# Patient Record
Sex: Male | Born: 1940 | Race: White | Hispanic: No | Marital: Married | Smoking: Former smoker
Health system: Southern US, Community
[De-identification: ages and names within clinical notes are randomized; demographics above are authoritative.]

## PROBLEM LIST (undated history)

## (undated) DIAGNOSIS — K219 Gastro-esophageal reflux disease without esophagitis: Secondary | ICD-10-CM

## (undated) DIAGNOSIS — C61 Malignant neoplasm of prostate: Secondary | ICD-10-CM

## (undated) DIAGNOSIS — J189 Pneumonia, unspecified organism: Secondary | ICD-10-CM

## (undated) DIAGNOSIS — D369 Benign neoplasm, unspecified site: Secondary | ICD-10-CM

## (undated) DIAGNOSIS — I2510 Atherosclerotic heart disease of native coronary artery without angina pectoris: Secondary | ICD-10-CM

## (undated) DIAGNOSIS — C4490 Unspecified malignant neoplasm of skin, unspecified: Secondary | ICD-10-CM

## (undated) DIAGNOSIS — Z9889 Other specified postprocedural states: Secondary | ICD-10-CM

## (undated) DIAGNOSIS — I499 Cardiac arrhythmia, unspecified: Secondary | ICD-10-CM

## (undated) DIAGNOSIS — E785 Hyperlipidemia, unspecified: Secondary | ICD-10-CM

## (undated) DIAGNOSIS — H269 Unspecified cataract: Secondary | ICD-10-CM

## (undated) DIAGNOSIS — Z972 Presence of dental prosthetic device (complete) (partial): Secondary | ICD-10-CM

## (undated) DIAGNOSIS — I700 Atherosclerosis of aorta: Secondary | ICD-10-CM

## (undated) DIAGNOSIS — K5790 Diverticulosis of intestine, part unspecified, without perforation or abscess without bleeding: Secondary | ICD-10-CM

## (undated) DIAGNOSIS — N400 Enlarged prostate without lower urinary tract symptoms: Secondary | ICD-10-CM

## (undated) DIAGNOSIS — R0600 Dyspnea, unspecified: Secondary | ICD-10-CM

## (undated) DIAGNOSIS — M109 Gout, unspecified: Secondary | ICD-10-CM

## (undated) DIAGNOSIS — I209 Angina pectoris, unspecified: Secondary | ICD-10-CM

## (undated) DIAGNOSIS — I4891 Unspecified atrial fibrillation: Secondary | ICD-10-CM

## (undated) DIAGNOSIS — N189 Chronic kidney disease, unspecified: Secondary | ICD-10-CM

## (undated) DIAGNOSIS — Z974 Presence of external hearing-aid: Secondary | ICD-10-CM

## (undated) DIAGNOSIS — I219 Acute myocardial infarction, unspecified: Secondary | ICD-10-CM

## (undated) DIAGNOSIS — Z87442 Personal history of urinary calculi: Secondary | ICD-10-CM

## (undated) DIAGNOSIS — M1990 Unspecified osteoarthritis, unspecified site: Secondary | ICD-10-CM

## (undated) DIAGNOSIS — G4733 Obstructive sleep apnea (adult) (pediatric): Secondary | ICD-10-CM

## (undated) DIAGNOSIS — J449 Chronic obstructive pulmonary disease, unspecified: Secondary | ICD-10-CM

## (undated) DIAGNOSIS — M4820 Kissing spine, site unspecified: Secondary | ICD-10-CM

## (undated) DIAGNOSIS — M810 Age-related osteoporosis without current pathological fracture: Secondary | ICD-10-CM

## (undated) DIAGNOSIS — I10 Essential (primary) hypertension: Secondary | ICD-10-CM

## (undated) DIAGNOSIS — G4730 Sleep apnea, unspecified: Secondary | ICD-10-CM

## (undated) DIAGNOSIS — J439 Emphysema, unspecified: Secondary | ICD-10-CM

## (undated) HISTORY — PX: CORONARY ARTERY BYPASS GRAFT: SHX141

## (undated) HISTORY — PX: CHOLECYSTECTOMY: SHX55

## (undated) HISTORY — PX: CATARACT EXTRACTION: SUR2

## (undated) HISTORY — PX: HERNIA REPAIR: SHX51

## (undated) HISTORY — DX: Unspecified malignant neoplasm of skin, unspecified: C44.90

## (undated) HISTORY — PX: OTHER SURGICAL HISTORY: SHX169

## (undated) HISTORY — PX: EYE SURGERY: SHX253

## (undated) HISTORY — PX: RETROPUBIC PROSTATECTOMY: SUR1055

## (undated) HISTORY — PX: BACK SURGERY: SHX140

## (undated) HISTORY — DX: Other specified postprocedural states: Z98.890

## (undated) HISTORY — PX: CORONARY ANGIOPLASTY: SHX604

---

## 1961-09-22 HISTORY — PX: OTHER SURGICAL HISTORY: SHX169

## 1990-09-22 DIAGNOSIS — Z951 Presence of aortocoronary bypass graft: Secondary | ICD-10-CM

## 1990-09-22 HISTORY — DX: Presence of aortocoronary bypass graft: Z95.1

## 1990-09-22 HISTORY — PX: CORONARY ARTERY BYPASS GRAFT: SHX141

## 2005-07-15 ENCOUNTER — Ambulatory Visit: Payer: Self-pay | Admitting: Gastroenterology

## 2006-12-03 ENCOUNTER — Ambulatory Visit: Payer: Self-pay | Admitting: Urology

## 2006-12-30 ENCOUNTER — Inpatient Hospital Stay: Payer: Self-pay | Admitting: Urology

## 2008-07-17 ENCOUNTER — Ambulatory Visit: Payer: Self-pay | Admitting: Gastroenterology

## 2008-08-03 ENCOUNTER — Ambulatory Visit: Payer: Self-pay | Admitting: Internal Medicine

## 2008-08-11 ENCOUNTER — Ambulatory Visit: Payer: Self-pay | Admitting: Unknown Physician Specialty

## 2009-06-11 ENCOUNTER — Ambulatory Visit: Payer: Self-pay | Admitting: Urology

## 2009-12-10 ENCOUNTER — Ambulatory Visit: Payer: Self-pay | Admitting: Urology

## 2010-04-10 ENCOUNTER — Ambulatory Visit: Payer: Self-pay | Admitting: Unknown Physician Specialty

## 2010-04-17 ENCOUNTER — Ambulatory Visit: Payer: Self-pay | Admitting: Unknown Physician Specialty

## 2010-04-19 ENCOUNTER — Ambulatory Visit: Payer: Self-pay | Admitting: Unknown Physician Specialty

## 2010-04-22 LAB — PATHOLOGY REPORT

## 2010-12-09 ENCOUNTER — Ambulatory Visit: Payer: Self-pay | Admitting: Urology

## 2011-01-30 ENCOUNTER — Ambulatory Visit: Payer: Self-pay | Admitting: Gastroenterology

## 2011-02-13 ENCOUNTER — Ambulatory Visit: Payer: Self-pay | Admitting: Gastroenterology

## 2011-02-19 ENCOUNTER — Ambulatory Visit: Payer: Self-pay | Admitting: Gastroenterology

## 2011-06-17 DIAGNOSIS — E785 Hyperlipidemia, unspecified: Secondary | ICD-10-CM | POA: Insufficient documentation

## 2011-06-17 DIAGNOSIS — I2510 Atherosclerotic heart disease of native coronary artery without angina pectoris: Secondary | ICD-10-CM | POA: Insufficient documentation

## 2011-06-17 DIAGNOSIS — E7801 Familial hypercholesterolemia: Secondary | ICD-10-CM | POA: Insufficient documentation

## 2011-06-17 DIAGNOSIS — G4733 Obstructive sleep apnea (adult) (pediatric): Secondary | ICD-10-CM | POA: Insufficient documentation

## 2011-09-07 ENCOUNTER — Emergency Department: Payer: Self-pay | Admitting: Unknown Physician Specialty

## 2011-10-31 ENCOUNTER — Ambulatory Visit: Payer: Self-pay | Admitting: Gastroenterology

## 2011-11-03 LAB — PATHOLOGY REPORT

## 2011-12-10 ENCOUNTER — Ambulatory Visit: Payer: Self-pay | Admitting: Urology

## 2012-12-09 DIAGNOSIS — C61 Malignant neoplasm of prostate: Secondary | ICD-10-CM | POA: Insufficient documentation

## 2012-12-09 DIAGNOSIS — N200 Calculus of kidney: Secondary | ICD-10-CM | POA: Insufficient documentation

## 2012-12-09 DIAGNOSIS — N393 Stress incontinence (female) (male): Secondary | ICD-10-CM | POA: Insufficient documentation

## 2012-12-09 DIAGNOSIS — N3941 Urge incontinence: Secondary | ICD-10-CM | POA: Insufficient documentation

## 2012-12-15 ENCOUNTER — Ambulatory Visit: Payer: Self-pay | Admitting: Urology

## 2012-12-15 DIAGNOSIS — Z8546 Personal history of malignant neoplasm of prostate: Secondary | ICD-10-CM | POA: Insufficient documentation

## 2013-08-25 ENCOUNTER — Encounter: Payer: Self-pay | Admitting: Podiatry

## 2013-08-26 ENCOUNTER — Ambulatory Visit (INDEPENDENT_AMBULATORY_CARE_PROVIDER_SITE_OTHER): Payer: Medicare Other

## 2013-08-26 ENCOUNTER — Ambulatory Visit (INDEPENDENT_AMBULATORY_CARE_PROVIDER_SITE_OTHER): Payer: Medicare Other | Admitting: Podiatry

## 2013-08-26 ENCOUNTER — Encounter: Payer: Self-pay | Admitting: Podiatry

## 2013-08-26 VITALS — BP 104/64 | HR 60 | Resp 20 | Ht 67.0 in | Wt 214.0 lb

## 2013-08-26 DIAGNOSIS — L600 Ingrowing nail: Secondary | ICD-10-CM

## 2013-08-26 DIAGNOSIS — M79609 Pain in unspecified limb: Secondary | ICD-10-CM

## 2013-08-26 DIAGNOSIS — M79672 Pain in left foot: Secondary | ICD-10-CM

## 2013-08-26 DIAGNOSIS — M722 Plantar fascial fibromatosis: Secondary | ICD-10-CM

## 2013-08-26 MED ORDER — TRIAMCINOLONE ACETONIDE 10 MG/ML IJ SUSP
10.0000 mg | Freq: Once | INTRAMUSCULAR | Status: AC
  Administered 2013-08-26: 10 mg

## 2013-08-26 NOTE — Patient Instructions (Signed)

## 2013-08-27 NOTE — Progress Notes (Signed)
Subjective:     Patient ID: Justin [name], male   DOB: 22-Feb-1941, 72 y.o.   MRN: [id number]  HPI patient presents stating my left heel is starting to really hurt me and I am having pain with my toenail I cannot get better myself by trimming or soaking   Review of Systems     Objective:   Physical Exam Neurovascular status intact with no health history changes noted and I noted there to be an incurvated left hallux nail medial border that it's painful and pain to palpation medial and central tubercle at the insertion of the fascia into the calcaneus    Assessment:     Ingrown toenail left hallux and plantar fasciitis left    Plan:     H&P and x-ray reviewed. Today I injected the left plantar fascia 3 mg Kenalog 5 mg Xylocaine Marcaine mixture and recommended ingrown toenail correction. Explained risk which she understands and wants procedure and today I infiltrated 60 mg Xylocaine Marcaine mixture remove the medial corner exposed matrix and applied chemical phenol 3 applications followed by alcohol lavaged and sterile dressing. Gave instructions on soaks and that total recovery. We'll take approximately 4 weeks

## 2013-09-09 ENCOUNTER — Ambulatory Visit (INDEPENDENT_AMBULATORY_CARE_PROVIDER_SITE_OTHER): Payer: Medicare Other | Admitting: Podiatry

## 2013-09-09 ENCOUNTER — Encounter: Payer: Self-pay | Admitting: Podiatry

## 2013-09-09 VITALS — BP 106/62 | HR 61 | Resp 16

## 2013-09-09 DIAGNOSIS — M722 Plantar fascial fibromatosis: Secondary | ICD-10-CM

## 2013-09-09 NOTE — Progress Notes (Signed)
   Subjective:    Patient ID: Justin [name], male    DOB: 29-Dec-1940, 72 y.o.   MRN: [id number]  HPI Comments: Left heel doing great , and the toe is doing well too      Review of Systems     Objective:   Physical Exam        Assessment & Plan:

## 2013-09-09 NOTE — Progress Notes (Signed)
Subjective:     Patient ID: Justin [name], male   DOB: 15-Oct-1940, 72 y.o.   MRN: [id number]  HPI patient states my heel is doing quite a bit better and my ingrown toenail has healed very well  Review of Systems     Objective:   Physical Exam Neurovascular status intact with no health history changes noted and pain to palpation left plantar fascia diminished from previous visit    Assessment:     Plantar fasciitis left improved    Plan:     Advised on physical therapy anti-inflammatories and supportive shoe gear. Reappoint her recheck again in the next one month

## 2013-09-30 DIAGNOSIS — R5383 Other fatigue: Secondary | ICD-10-CM | POA: Insufficient documentation

## 2013-09-30 DIAGNOSIS — R5381 Other malaise: Secondary | ICD-10-CM | POA: Insufficient documentation

## 2013-12-09 ENCOUNTER — Encounter: Payer: Self-pay | Admitting: Podiatry

## 2013-12-09 ENCOUNTER — Ambulatory Visit (INDEPENDENT_AMBULATORY_CARE_PROVIDER_SITE_OTHER): Payer: Medicare Other | Admitting: Podiatry

## 2013-12-09 ENCOUNTER — Ambulatory Visit (INDEPENDENT_AMBULATORY_CARE_PROVIDER_SITE_OTHER): Payer: Medicare Other

## 2013-12-09 DIAGNOSIS — M7750 Other enthesopathy of unspecified foot: Secondary | ICD-10-CM

## 2013-12-09 DIAGNOSIS — M79673 Pain in unspecified foot: Secondary | ICD-10-CM

## 2013-12-09 DIAGNOSIS — M79609 Pain in unspecified limb: Secondary | ICD-10-CM

## 2013-12-09 MED ORDER — TRIAMCINOLONE ACETONIDE 10 MG/ML IJ SUSP
10.0000 mg | Freq: Once | INTRAMUSCULAR | Status: AC
  Administered 2013-12-09: 10 mg

## 2013-12-09 NOTE — Progress Notes (Signed)
Subjective:     Patient ID: Justin [name], male   DOB: 09/30/1940, 73 y.o.   MRN: [id number]  HPI patient presents stating I'm having a lot of pain on top of my left foot making it hard to walk   Review of Systems     Objective:   Physical Exam Neurovascular status intact with inflammation midtarsal joint and spreading to the first metatarsocuneiform joint left foot    Assessment:     Inflammatory tendinitis secondary to bone compression    Plan:     X-ray reviewed and injected the dorsal midtarsal joint and tendon complex 3 mg Kenalog 5 of them slightly Marcaine mixture and advised if symptoms continue to consider orthotics

## 2014-01-21 DIAGNOSIS — K2100 Gastro-esophageal reflux disease with esophagitis, without bleeding: Secondary | ICD-10-CM | POA: Insufficient documentation

## 2014-01-21 DIAGNOSIS — M810 Age-related osteoporosis without current pathological fracture: Secondary | ICD-10-CM | POA: Insufficient documentation

## 2014-02-15 DIAGNOSIS — R339 Retention of urine, unspecified: Secondary | ICD-10-CM | POA: Insufficient documentation

## 2014-02-15 DIAGNOSIS — N139 Obstructive and reflux uropathy, unspecified: Secondary | ICD-10-CM | POA: Insufficient documentation

## 2014-05-12 ENCOUNTER — Encounter: Payer: Self-pay | Admitting: Podiatry

## 2014-05-12 ENCOUNTER — Ambulatory Visit (INDEPENDENT_AMBULATORY_CARE_PROVIDER_SITE_OTHER): Payer: Medicare Other | Admitting: Podiatry

## 2014-05-12 VITALS — BP 101/64 | HR 68 | Resp 16

## 2014-05-12 DIAGNOSIS — M722 Plantar fascial fibromatosis: Secondary | ICD-10-CM

## 2014-05-12 MED ORDER — TRIAMCINOLONE ACETONIDE 10 MG/ML IJ SUSP: 10.0000 mg | Freq: Once | INTRAMUSCULAR | Status: DC

## 2014-05-14 NOTE — Progress Notes (Signed)
Subjective:     Patient ID: Justin [name], male   DOB: [birth year], 73 y.o.   MRN: [id number]  HPI patient presents stating I'm having a lot of pain in my left heel that is worse when I get up in the morning and after periods of sitting   Review of Systems     Objective:   Physical Exam Neurovascular status intact with muscle strength adequate and range of motion subtalar midtarsal joint within normal limits. Patient is found to have exquisite discomfort plantar aspect left heel at the insertion of the tendon into the calcaneus    Assessment:     Plantar fasciitis of the left heel with inflammation and fluid at the insertion of the tendon    Plan:     Reviewed condition and injected the left plantar fashion 3 mg Kenalog 5 mg Xylocaine Marcaine mixture and dispensed night splint with instructions on usage

## 2014-05-19 ENCOUNTER — Ambulatory Visit (INDEPENDENT_AMBULATORY_CARE_PROVIDER_SITE_OTHER): Payer: Medicare Other | Admitting: Podiatry

## 2014-05-19 VITALS — BP 108/58 | HR 56 | Resp 16

## 2014-05-19 DIAGNOSIS — M722 Plantar fascial fibromatosis: Secondary | ICD-10-CM

## 2014-05-19 NOTE — Progress Notes (Signed)
Subjective:     Patient ID: Justin [name], male   DOB: 17-Jan-1941, 73 y.o.   MRN: [id number]  HPI patient presents stating the heel is feeling some better but I'm getting some pain on top of my foot and I think I need orthotics because I don't have the fat pad under my heel   Review of Systems     Objective:   Physical Exam Neurovascular status is intact with a very narrow foot type and significant bone exposure on the plantar aspect of the heel left over right. Patient is found to have minimal discomfort upon palpation but does have some discomfort in the dorsum of the foot    Assessment:     Plantar fasciitis left which is improving with dorsal pain which is still present    Plan:     H&P reviewed and today I scanned for custom orthotics to take stress off the plantar heel. I also went ahead and dispensed a night splint as I do think he is to require stretch given the chronic nature of his plantar fasciitis

## 2014-06-02 ENCOUNTER — Ambulatory Visit (INDEPENDENT_AMBULATORY_CARE_PROVIDER_SITE_OTHER): Payer: Medicare Other | Admitting: *Deleted

## 2014-06-02 VITALS — BP 104/64 | HR 60 | Resp 16

## 2014-06-02 DIAGNOSIS — M722 Plantar fascial fibromatosis: Secondary | ICD-10-CM

## 2014-06-02 NOTE — Patient Instructions (Signed)

## 2014-06-02 NOTE — Progress Notes (Signed)
Pt presents to pick up orthotics. Went over wearing instructions with pt. 

## 2014-08-01 ENCOUNTER — Ambulatory Visit: Payer: Self-pay | Admitting: Internal Medicine

## 2014-10-12 DIAGNOSIS — M5136 Other intervertebral disc degeneration, lumbar region: Secondary | ICD-10-CM | POA: Insufficient documentation

## 2015-11-03 ENCOUNTER — Emergency Department: Payer: Medicare Other

## 2015-11-03 ENCOUNTER — Inpatient Hospital Stay
Admission: EM | Admit: 2015-11-03 | Discharge: 2015-11-08 | DRG: 193 | Disposition: A | Discharge status: Home / Self Care | Payer: Medicare Other | Attending: Internal Medicine | Admitting: Internal Medicine

## 2015-11-03 ENCOUNTER — Encounter: Payer: Self-pay | Admitting: Emergency Medicine

## 2015-11-03 DIAGNOSIS — J9601 Acute respiratory failure with hypoxia: Secondary | ICD-10-CM | POA: Diagnosis present

## 2015-11-03 DIAGNOSIS — J189 Pneumonia, unspecified organism: Secondary | ICD-10-CM | POA: Diagnosis not present

## 2015-11-03 DIAGNOSIS — I214 Non-ST elevation (NSTEMI) myocardial infarction: Secondary | ICD-10-CM | POA: Diagnosis present

## 2015-11-03 DIAGNOSIS — Z951 Presence of aortocoronary bypass graft: Secondary | ICD-10-CM | POA: Diagnosis not present

## 2015-11-03 DIAGNOSIS — R079 Chest pain, unspecified: Secondary | ICD-10-CM | POA: Diagnosis present

## 2015-11-03 DIAGNOSIS — I2510 Atherosclerotic heart disease of native coronary artery without angina pectoris: Secondary | ICD-10-CM | POA: Diagnosis present

## 2015-11-03 DIAGNOSIS — K219 Gastro-esophageal reflux disease without esophagitis: Secondary | ICD-10-CM | POA: Diagnosis present

## 2015-11-03 DIAGNOSIS — G8929 Other chronic pain: Secondary | ICD-10-CM | POA: Diagnosis present

## 2015-11-03 DIAGNOSIS — R0902 Hypoxemia: Secondary | ICD-10-CM | POA: Diagnosis not present

## 2015-11-03 DIAGNOSIS — E785 Hyperlipidemia, unspecified: Secondary | ICD-10-CM | POA: Diagnosis present

## 2015-11-03 DIAGNOSIS — J181 Lobar pneumonia, unspecified organism: Secondary | ICD-10-CM | POA: Diagnosis present

## 2015-11-03 DIAGNOSIS — I219 Acute myocardial infarction, unspecified: Secondary | ICD-10-CM

## 2015-11-03 HISTORY — DX: Atherosclerotic heart disease of native coronary artery without angina pectoris: I25.10

## 2015-11-03 HISTORY — DX: Acute myocardial infarction, unspecified: I21.9

## 2015-11-03 HISTORY — DX: Unspecified osteoarthritis, unspecified site: M19.90

## 2015-11-03 LAB — CBC
HCT: 41.5 % (ref 40.0–52.0)
Hemoglobin: 14.1 g/dL (ref 13.0–18.0)
MCH: 33.3 pg (ref 26.0–34.0)
MCHC: 33.9 g/dL (ref 32.0–36.0)
MCV: 98.3 fL (ref 80.0–100.0)
PLATELETS: 117 10*3/uL — AB (ref 150–440)
RBC: 4.23 MIL/uL — AB (ref 4.40–5.90)
RDW: 13.4 % (ref 11.5–14.5)
WBC: 14.6 10*3/uL — AB (ref 3.8–10.6)

## 2015-11-03 LAB — BASIC METABOLIC PANEL
Anion gap: 8 (ref 5–15)
BUN: 12 mg/dL (ref 6–20)
CALCIUM: 9 mg/dL (ref 8.9–10.3)
CO2: 28 mmol/L (ref 22–32)
CREATININE: 0.99 mg/dL (ref 0.61–1.24)
Chloride: 104 mmol/L (ref 101–111)
Glucose, Bld: 132 mg/dL — ABNORMAL HIGH (ref 65–99)
Potassium: 3.9 mmol/L (ref 3.5–5.1)
SODIUM: 140 mmol/L (ref 135–145)

## 2015-11-03 LAB — TROPONIN I
TROPONIN I: 9.28 ng/mL — AB (ref <0.031)
Troponin I: 3.26 ng/mL — ABNORMAL HIGH (ref <0.031)

## 2015-11-03 LAB — PROTIME-INR
INR: 1.24
PROTHROMBIN TIME: 15.8 s — AB (ref 11.4–15.0)

## 2015-11-03 LAB — APTT: APTT: 122 s — AB (ref 24–36)

## 2015-11-03 LAB — HEPARIN LEVEL (UNFRACTIONATED): Heparin Unfractionated: 0.46 IU/mL (ref 0.30–0.70)

## 2015-11-03 MED ORDER — NITROGLYCERIN 0.4 MG SL SUBL: 0.4000 mg | SUBLINGUAL_TABLET | SUBLINGUAL | Status: DC | PRN

## 2015-11-03 MED ORDER — TRAMADOL HCL 50 MG PO TABS
50.0000 mg | ORAL_TABLET | Freq: Three times a day (TID) | ORAL | Status: DC
  Administered 2015-11-03 – 2015-11-07 (×12): 50 mg via ORAL
  Filled 2015-11-03 (×12): qty 1

## 2015-11-03 MED ORDER — ACETAMINOPHEN 325 MG PO TABS
650.0000 mg | ORAL_TABLET | Freq: Four times a day (QID) | ORAL | Status: DC | PRN
  Administered 2015-11-03 – 2015-11-04 (×3): 650 mg via ORAL
  Filled 2015-11-03 (×3): qty 2

## 2015-11-03 MED ORDER — SODIUM CHLORIDE 0.9 % IV BOLUS (SEPSIS)
1000.0000 mL | Freq: Once | INTRAVENOUS | Status: AC
  Administered 2015-11-03: 1000 mL via INTRAVENOUS

## 2015-11-03 MED ORDER — ATORVASTATIN CALCIUM 20 MG PO TABS
40.0000 mg | ORAL_TABLET | Freq: Every day | ORAL | Status: DC
  Administered 2015-11-03 – 2015-11-07 (×5): 40 mg via ORAL
  Filled 2015-11-03 (×5): qty 2

## 2015-11-03 MED ORDER — FLUTICASONE PROPIONATE 50 MCG/ACT NA SUSP
2.0000 | Freq: Every day | NASAL | Status: DC
  Administered 2015-11-03 – 2015-11-05 (×3): 2 via NASAL
  Filled 2015-11-03: qty 16

## 2015-11-03 MED ORDER — ENOXAPARIN SODIUM 40 MG/0.4ML ~~LOC~~ SOLN: 40.0000 mg | SUBCUTANEOUS | Status: DC

## 2015-11-03 MED ORDER — LEVOFLOXACIN IN D5W 750 MG/150ML IV SOLN
750.0000 mg | Freq: Once | INTRAVENOUS | Status: AC
  Administered 2015-11-03: 750 mg via INTRAVENOUS
  Filled 2015-11-03: qty 150

## 2015-11-03 MED ORDER — IPRATROPIUM-ALBUTEROL 0.5-2.5 (3) MG/3ML IN SOLN: 3.0000 mL | RESPIRATORY_TRACT | Status: DC | PRN

## 2015-11-03 MED ORDER — HEPARIN (PORCINE) IN NACL 100-0.45 UNIT/ML-% IJ SOLN
1050.0000 [IU]/h | INTRAMUSCULAR | Status: DC
  Administered 2015-11-03 – 2015-11-06 (×4): 1050 [IU]/h via INTRAVENOUS
  Filled 2015-11-03 (×8): qty 250

## 2015-11-03 MED ORDER — ONDANSETRON HCL 4 MG/2ML IJ SOLN: 4.0000 mg | Freq: Four times a day (QID) | INTRAMUSCULAR | Status: DC | PRN

## 2015-11-03 MED ORDER — ACETAMINOPHEN 650 MG RE SUPP: 650.0000 mg | Freq: Four times a day (QID) | RECTAL | Status: DC | PRN

## 2015-11-03 MED ORDER — ISOSORBIDE MONONITRATE ER 60 MG PO TB24
60.0000 mg | ORAL_TABLET | Freq: Every day | ORAL | Status: DC
  Administered 2015-11-04 – 2015-11-08 (×5): 60 mg via ORAL
  Filled 2015-11-03 (×5): qty 1

## 2015-11-03 MED ORDER — METOPROLOL TARTRATE 50 MG PO TABS
50.0000 mg | ORAL_TABLET | Freq: Two times a day (BID) | ORAL | Status: DC
  Administered 2015-11-03 – 2015-11-08 (×10): 50 mg via ORAL
  Filled 2015-11-03 (×11): qty 1

## 2015-11-03 MED ORDER — SODIUM CHLORIDE 0.9 % IV SOLN: 250.0000 mL | INTRAVENOUS | Status: DC | PRN

## 2015-11-03 MED ORDER — HEPARIN BOLUS VIA INFUSION
4000.0000 [IU] | Freq: Once | INTRAVENOUS | Status: AC
  Administered 2015-11-03: 4000 [IU] via INTRAVENOUS
  Filled 2015-11-03: qty 4000

## 2015-11-03 MED ORDER — SODIUM CHLORIDE 0.9% FLUSH: 3.0000 mL | INTRAVENOUS | Status: DC | PRN

## 2015-11-03 MED ORDER — ASPIRIN EC 81 MG PO TBEC: 81.0000 mg | DELAYED_RELEASE_TABLET | Freq: Every day | ORAL | Status: DC

## 2015-11-03 MED ORDER — LEVOFLOXACIN IN D5W 750 MG/150ML IV SOLN
750.0000 mg | INTRAVENOUS | Status: DC
  Administered 2015-11-04 – 2015-11-05 (×2): 750 mg via INTRAVENOUS
  Filled 2015-11-03 (×3): qty 150

## 2015-11-03 MED ORDER — LORATADINE 10 MG PO TABS
10.0000 mg | ORAL_TABLET | Freq: Every day | ORAL | Status: DC
  Administered 2015-11-04 – 2015-11-08 (×5): 10 mg via ORAL
  Filled 2015-11-03 (×5): qty 1

## 2015-11-03 MED ORDER — PANTOPRAZOLE SODIUM 40 MG PO TBEC
40.0000 mg | DELAYED_RELEASE_TABLET | Freq: Every day | ORAL | Status: DC
  Administered 2015-11-04 – 2015-11-05 (×2): 40 mg via ORAL
  Filled 2015-11-03 (×2): qty 1

## 2015-11-03 MED ORDER — ASPIRIN EC 81 MG PO TBEC
81.0000 mg | DELAYED_RELEASE_TABLET | Freq: Every day | ORAL | Status: DC
  Administered 2015-11-04 – 2015-11-08 (×5): 81 mg via ORAL
  Filled 2015-11-03 (×5): qty 1

## 2015-11-03 MED ORDER — NIACIN 500 MG PO TABS
500.0000 mg | ORAL_TABLET | Freq: Three times a day (TID) | ORAL | Status: DC
  Administered 2015-11-03 – 2015-11-08 (×13): 500 mg via ORAL
  Filled 2015-11-03 (×16): qty 1

## 2015-11-03 MED ORDER — ASPIRIN 81 MG PO CHEW
324.0000 mg | CHEWABLE_TABLET | Freq: Once | ORAL | Status: AC
  Administered 2015-11-03: 324 mg via ORAL
  Filled 2015-11-03: qty 4

## 2015-11-03 MED ORDER — SODIUM CHLORIDE 0.9% FLUSH
3.0000 mL | Freq: Two times a day (BID) | INTRAVENOUS | Status: DC
  Administered 2015-11-03 – 2015-11-06 (×5): 3 mL via INTRAVENOUS

## 2015-11-03 MED ORDER — MAGNESIUM OXIDE 400 (241.3 MG) MG PO TABS
400.0000 mg | ORAL_TABLET | Freq: Two times a day (BID) | ORAL | Status: DC
  Administered 2015-11-03 – 2015-11-07 (×9): 400 mg via ORAL
  Filled 2015-11-03 (×9): qty 1

## 2015-11-03 MED ORDER — POLYETHYLENE GLYCOL 3350 17 G PO PACK
17.0000 g | PACK | Freq: Every day | ORAL | Status: DC | PRN
  Administered 2015-11-04 – 2015-11-06 (×3): 17 g via ORAL
  Filled 2015-11-03 (×3): qty 1

## 2015-11-03 MED ORDER — SODIUM CHLORIDE 0.9 % IV BOLUS (SEPSIS)
500.0000 mL | Freq: Once | INTRAVENOUS | Status: AC
  Administered 2015-11-03: 500 mL via INTRAVENOUS

## 2015-11-03 MED ORDER — ONDANSETRON HCL 4 MG PO TABS: 4.0000 mg | ORAL_TABLET | Freq: Four times a day (QID) | ORAL | Status: DC | PRN

## 2015-11-03 NOTE — Progress Notes (Signed)
Pharmacy Antibiotic Note  Justin [name] is a 75 y.o. male admitted on 11/03/2015 with pneumonia and ACS.  Pharmacy has been consulted for levofloxacin dosing. Patient received levofloxacin 750mg  X1 in ED at 1400  Plan: Patients CrCl  72 ml/min. Will continue levofloxacin 750mg  IV daily for CAP. Recommended duration of therapy 5-7 days.  Pharmacy will continue to monitor renal function and adjsut dose as needed.   Height: 5\' 8"  (172.7 cm) Weight: 202 lb (91.627 kg) IBW/kg (Calculated) : 68.4  Temp (24hrs), Avg:98.3 F (36.8 C), Min:98.3 F (36.8 C), Max:98.3 F (36.8 C)   Recent Labs Lab 11/03/15 1134  WBC 14.6*  CREATININE 0.99    Estimated Creatinine Clearance: 71.9 mL/min (by C-G formula based on Cr of 0.99).    No Known Allergies  Antimicrobials this admission: 02/11>> levofloxacin   Dose adjustments this admission: N/A  Microbiology results: 02/11 BCx: pending 02/11 Sputum: pending   Thank you for allowing pharmacy to be a part of this patient's care.  Nancy Fetter, PharmD Pharmacy Resident 11/03/2015 2:10 PM

## 2015-11-03 NOTE — H&P (Signed)
Justin [name] at [place] NAME: Justin [name]    MR#:  [id number]  DATE OF BIRTH:  Jan 17, 1941  DATE OF ADMISSION:  11/03/2015  PRIMARY CARE PHYSICIAN: Rusty Aus., MD   REQUESTING/REFERRING PHYSICIAN: Dr. Mariea Clonts  CHIEF COMPLAINT:   Chief Complaint  Patient presents with  . Shortness of Breath    HISTORY OF PRESENT ILLNESS:  Justin [name]  is a 75 y.o. male with a known history of CAD status post CABG presents to the emergency room complaining of acute onset of shortness of breath which woke him up from sleep. Patient normally wears CPAP. He took his CPAP out check his pulse ox and was at 80%. Patient later put his CPAP back and his saturations improved. His shortness of breath slowly improved. But with minimal ambulation patient is desatting into the 70s . His saturations were 79% on ambulation with EMS and he almost passed out. Here his chest x-ray showed right middle and lower lobe pneumonia. White count elevated at 14,000. Afebrile. Patient is not needing oxygen at rest.  Patient did have mild chest heaviness earlier which has resolved. Troponin checked in the emergency room has returned at 9.38. EKG shows nothing acute.  PAST MEDICAL HISTORY:   Past Medical History  Diagnosis Date  . Skin cancer   . History of open heart surgery     PAST SURGICAL HISTORY:   Past Surgical History  Procedure Laterality Date  . Hernia removed    . Open heart surgery    . Skin cancer removed    . Eye surgery      SOCIAL HISTORY:   Social History  Substance Use Topics  . Smoking status: Former Smoker    Quit date: 06/26/1990  . Smokeless tobacco: Never Used  . Alcohol Use: No    FAMILY HISTORY:   Family History  Problem Relation Age of Onset  . Cancer Father     DRUG ALLERGIES:  No Known Allergies  REVIEW OF SYSTEMS:   Review of Systems  Constitutional: Positive for malaise/fatigue. Negative for fever, chills and weight  loss.  HENT: Negative for hearing loss and nosebleeds.   Eyes: Negative for blurred vision, double vision and pain.  Respiratory: Positive for cough. Negative for hemoptysis, sputum production, shortness of breath and wheezing.   Cardiovascular: Positive for chest pain. Negative for palpitations, orthopnea and leg swelling.  Gastrointestinal: Negative for nausea, vomiting, abdominal pain, diarrhea and constipation.  Genitourinary: Negative for dysuria and hematuria.  Musculoskeletal: Positive for back pain. Negative for myalgias and falls.  Skin: Negative for rash.  Neurological: Positive for weakness. Negative for dizziness, tremors, sensory change, speech change, focal weakness, seizures and headaches.  Endo/Heme/Allergies: Does not bruise/bleed easily.  Psychiatric/Behavioral: Negative for depression and memory loss. The patient is not nervous/anxious.     MEDICATIONS AT HOME:   Prior to Admission medications   Medication Sig Start Date End Date Taking? Authorizing Provider  acetaminophen (TYLENOL) 325 MG tablet Take 650 mg by mouth every 6 (six) hours as needed.    Historical Provider, MD  aspirin 81 MG tablet Take 81 mg by mouth daily.    Historical Provider, MD  atorvastatin (LIPITOR) 80 MG tablet Take 80 mg by mouth daily. 1/2 TABLET AT NIGHT    Historical Provider, MD  Calcium Carb-Cholecalciferol (CALCIUM 600 + D PO) Take by mouth. Take two tablets in the morning and take one tablet in the afternoon    Historical Provider, MD  Cholecalciferol (VITAMIN D-3 PO) Take by mouth. Take one tablet in the morning take one tablet in the afternoon and take one tablet at night    Historical Provider, MD  fexofenadine (ALLEGRA) 180 MG tablet Take 180 mg by mouth daily.    Historical Provider, MD  Fish Oil-Cholecalciferol (FISH OIL + D3 PO) Take by mouth.    Historical Provider, MD  fluticasone (VERAMYST) 27.5 MCG/SPRAY nasal spray Place 2 sprays into the nose daily.    Historical Provider, MD   isosorbide mononitrate (IMDUR) 60 MG 24 hr tablet Take 60 mg by mouth daily.    Historical Provider, MD  ketorolac (ACULAR) 0.5 % ophthalmic solution  04/12/14   Historical Provider, MD  LACTOBACILLUS PO Take by mouth 2 (two) times daily.    Historical Provider, MD  Magnesium 400 MG CAPS Take by mouth daily.    Historical Provider, MD  metoprolol (LOPRESSOR) 50 MG tablet Take 50 mg by mouth 2 (two) times daily.    Historical Provider, MD  niacin 500 MG tablet 500 mg. Take one tablet in the morning, take one tablet in the afternoon and take one tablet at night    Historical Provider, MD  nitroGLYCERIN (NITROSTAT) 0.4 MG SL tablet Place 0.4 mg under the tongue as needed for chest pain.    Historical Provider, MD  ofloxacin (OCUFLOX) 0.3 % ophthalmic solution  04/12/14   Historical Provider, MD  omeprazole (PRILOSEC) 20 MG capsule Take 20 mg by mouth 2 (two) times daily before a meal.    Historical Provider, MD  senna-docusate (SENOKOT-S) 8.6-50 MG per tablet Take 1 tablet by mouth. [place] Provider, MD  traMADol (ULTRAM) 50 MG tablet Take 50 mg by mouth every 6 (six) hours as needed.    Historical Provider, MD  vitamin C (ASCORBIC ACID) 500 MG tablet Take 500 mg by mouth daily.    Historical Provider, MD  Zoledronic Acid (RECLAST IV) Inject into the vein. Injection once yearly    Historical Provider, MD      VITAL SIGNS:  Blood pressure 113/65, pulse 76, temperature 98.3 F (36.8 C), temperature source Oral, resp. rate 23, height 5\' 8"  (1.727 m), weight 91.627 kg (202 lb), SpO2 98 %.  PHYSICAL EXAMINATION:  Physical Exam  GENERAL:  75 y.o.-year-old patient lying in the bed with no acute distress.  EYES: Pupils equal, round, reactive to light and accommodation. No scleral icterus. Extraocular muscles intact.  HEENT: Head atraumatic, normocephalic. Oropharynx and nasopharynx clear. No oropharyngeal erythema, moist oral mucosa  NECK:  Supple, no jugular venous distention. No  thyroid enlargement, no tenderness.  LUNGS: Normal breath sounds bilaterally, no wheezing, rales, rhonchi. No use of accessory muscles of respiration. Decreased breath sounds on the right side CARDIOVASCULAR: S1, S2 normal. No murmurs, rubs, or gallops.  ABDOMEN: Soft, nontender, nondistended. Bowel sounds present. No organomegaly or mass.  EXTREMITIES: No pedal edema, cyanosis, or clubbing. + 2 pedal & radial pulses b/l.   NEUROLOGIC: Cranial nerves II through XII are intact. No focal Motor or sensory deficits appreciated b/l PSYCHIATRIC: The patient is alert and oriented x 3. Good affect.  SKIN: No obvious rash, lesion, or ulcer.   LABORATORY PANEL:   CBC  Recent Labs Lab 11/03/15 1134  WBC 14.6*  HGB 14.1  HCT 41.5  PLT 117*   ------------------------------------------------------------------------------------------------------------------  Chemistries   Recent Labs Lab 11/03/15 1134  NA 140  K 3.9  CL 104  CO2 28  GLUCOSE 132*  BUN 12  CREATININE 0.99  CALCIUM 9.0   ------------------------------------------------------------------------------------------------------------------  Cardiac Enzymes  Recent Labs Lab 11/03/15 1134  TROPONINI 9.28*   ------------------------------------------------------------------------------------------------------------------  RADIOLOGY:  Dg Chest 2 View  11/03/2015  CLINICAL DATA:  75 year old male with acute shortness of breath and chills. EXAM: CHEST  2 VIEW COMPARISON:  09/07/2011 and prior chest radiographs FINDINGS: Cardiomegaly and CABG changes noted. Right lower lobe airspace disease is compatible with pneumonia. Possible right middle lobe airspace disease may be present. There is no evidence of pneumothorax or pleural effusion. Thoracic compression fracture and vertebral augmentation changes noted. IMPRESSION: Right lower lobe and possible right middle lobe airspace disease compatible with pneumonia. Radiographic follow-up  to resolution is recommended. Cardiomegaly. Electronically Signed   By: Margarette Canada M.D.   On: 11/03/2015 11:56     IMPRESSION AND PLAN:   * Right middle and lower lobe pneumonia with acute hypoxic respiratory failure We'll start IV Levaquin for community acquired pneumonia. Send for sputum cultures. Blood cultures ordered in the emergency room.  * Elevated troponin with chest heaviness Could be demand ischemia from the pneumonia but patient's vitals are stable. This seems most likely NSTEMI.  Aspirin, statin. We'll start patient on heparin drip to trend troponin levels. Check echocardiogram. Discussed case with Dr. Ubaldo Glassing of cardiology. He will likely need cardiac catheterization.  * Chronic back pain Continue home medications  * DVT prophylaxis Lovenox   All the records are reviewed and case discussed with ED provider. Management plans discussed with the patient, family and they are in agreement.  CODE STATUS: FULL  Patient is critically ill with MI and pneumonia. TOTAL CC TIME TAKING CARE OF THIS PATIENT: 40 minutes.    Hillary Bow R M.D on 11/03/2015 at 1:41 PM  Between 7am to 6pm - Pager - [phone]  After 6pm go to [email or web address] - password [email or web address] [facility]  Office  [phone]  CC: Primary care physician; Rusty Aus., MD  Note: This dictation was prepared with Dragon dictation along with smaller phrase technology. Any transcriptional errors that result from this process are unintentional.

## 2015-11-03 NOTE — Progress Notes (Signed)
Patient admitted to unit. Oriented to room, call bell, and staff. Bed in lowest position. Fall safety plan reviewed. Full assessment to Epic. Skin assessment verified with ---Lurlean Horns RN--. Telemetry box verification with tele clerk and Lurlean Horns RN- Box#: ---[id number]--. Will continue to monitor.

## 2015-11-03 NOTE — ED Notes (Signed)
Phlebotomist called to draw blood cultures. 4 different staff members have attempted blood draw without success.

## 2015-11-03 NOTE — Progress Notes (Signed)
ANTICOAGULATION CONSULT NOTE - Initial Consult  Pharmacy Consult for heparin drip Indication: chest pain/ACS  No Known Allergies  Patient Measurements: Height: 5\' 8"  (172.7 cm) Weight: 202 lb (91.627 kg) IBW/kg (Calculated) : 68.4 Heparin Dosing Weight: 87 kg  Vital Signs: Temp: 98.3 F (36.8 C) (02/11 1122) Temp Source: Oral (02/11 1122) BP: 93/40 mmHg (02/11 1330) Pulse Rate: 65 (02/11 1330)   Recent Labs  11/03/15 1134  HGB 14.1  HCT 41.5  PLT 117*  CREATININE 0.99  TROPONINI 9.28*    Estimated Creatinine Clearance: 71.9 mL/min (by C-G formula based on Cr of 0.99).   Medical History: Past Medical History  Diagnosis Date  . Skin cancer   . History of open heart surgery   . CAD (coronary artery disease)   . Arthritis   . DJD (degenerative joint disease)    Assessment 75 yo male seen in ED with SOB, chills and chest pain.  Patient was diagnosis with PNA and found to have elevated troponin I of 9.28. Pharmacy consulted for dosing and monitoring of heparin for ACS.  Goal of Therapy:  Heparin level 0.3-0.7 units/ml Monitor platelets by anticoagulation protocol: Yes   Plan:  Give 4000 units bolus x 1 Start heparin infusion at 1050 units/hr Check anti-Xa level in 8 hours and daily while on heparin Continue to monitor H&H and platelets  APTT and PT/INR ordered   Nancy Fetter, PharmD Pharmacy Resident  11/03/2015,2:02 PM

## 2015-11-03 NOTE — ED Notes (Signed)
Attending MD notified of BP 89/52. Orders received for x1 bolus 522mL NS.

## 2015-11-03 NOTE — ED Notes (Signed)
Lab notified to send phelbotomist to ED to draw blood cultures. Staff unable to obtain samples.

## 2015-11-03 NOTE — ED Notes (Addendum)
Pt arrived via EMS from home with c/o shortness of breath on exertion and at rest that started around 0400. Pt states he woke up gasping for a breath while he was wearing his CPAP.  Pt also c/o pain between shoulder pains and now lower back pain.  Pt has history of back surgery and bulging disc.  Pt also reports headache that started last night pain is in the temporal area and radiates to the neck.  Pt wears CPAP at night. Pt states he has been having chills as well/

## 2015-11-03 NOTE — ED Provider Notes (Signed)
[facility] Emergency Department Provider Note  ____________________________________________  Time seen: Approximately 12:37 PM  I have reviewed the triage vital signs and the nursing notes.   HISTORY  Chief Complaint Shortness of Breath    HPI Justin [name] is a 75 y.o. male presenting with shortness of breath and chills. Patient reports that he uses CPAP at night, but despite using it overnight he developed acute "gasping for air." This was associated with chills. He also had worsening shortness of breath with even minimal exertion such as sitting upwards. His O2 monitor at home stated that his oxygen level was 79%. No cough, rhinorrhea, sore throat or ear pain. Positive congestion. The patient's brother is currently admitted for pneumonia and the patient did spend 8 hours in the waiting room and part of the brothers hospitalization in contact with him. No known fever. Patient did have a brief episode of chest "pressure" centrally without radiation on arrival here but is currently chest pain-free.   Past Medical History  Diagnosis Date  . Skin cancer   . History of open heart surgery     Patient Active Problem List   Diagnosis Date Noted  . Incomplete bladder emptying 02/15/2014  . Obstruction of urinary tract 02/15/2014  . OP (osteoporosis) 01/21/2014  . Esophagitis, reflux 01/21/2014  . Fatigue 09/30/2013  . Malaise and fatigue 09/30/2013  . H/O malignant neoplasm of prostate 12/15/2012  . Calculus of kidney 12/09/2012  . ED (erectile dysfunction) of organic origin 12/09/2012  . CA of prostate ([place]) 12/09/2012  . Genuine stress incontinence, male 12/09/2012  . Urge incontinence 12/09/2012  . Arteriosclerosis of coronary artery 06/17/2011  . Atherosclerosis of coronary artery 06/17/2011  . HLD (hyperlipidemia) 06/17/2011  . Obstructive apnea 06/17/2011    Past Surgical History  Procedure Laterality Date  . Hernia removed    . Open heart  surgery    . Skin cancer removed    . Eye surgery      Current Outpatient Rx  Name  Route  Sig  Dispense  Refill  . acetaminophen (TYLENOL) 325 MG tablet   Oral   Take 650 mg by mouth every 6 (six) hours as needed.         Marland Kitchen aspirin 81 MG tablet   Oral   Take 81 mg by mouth daily.         Marland Kitchen atorvastatin (LIPITOR) 80 MG tablet   Oral   Take 80 mg by mouth daily. 1/2 TABLET AT NIGHT         . Calcium Carb-Cholecalciferol (CALCIUM 600 + D PO)   Oral   Take by mouth. Take two tablets in the morning and take one tablet in the afternoon         . Cholecalciferol (VITAMIN D-3 PO)   Oral   Take by mouth. Take one tablet in the morning take one tablet in the afternoon and take one tablet at night         . fexofenadine (ALLEGRA) 180 MG tablet   Oral   Take 180 mg by mouth daily.         . Fish Oil-Cholecalciferol (FISH OIL + D3 PO)   Oral   Take by mouth.         . fluticasone (VERAMYST) 27.5 MCG/SPRAY nasal spray   Nasal   Place 2 sprays into the nose daily.         . isosorbide mononitrate (IMDUR) 60 MG 24 hr tablet  Oral   Take 60 mg by mouth daily.         Marland Kitchen ketorolac (ACULAR) 0.5 % ophthalmic solution               . LACTOBACILLUS PO   Oral   Take by mouth 2 (two) times daily.         . Magnesium 400 MG CAPS   Oral   Take by mouth daily.         . metoprolol (LOPRESSOR) 50 MG tablet   Oral   Take 50 mg by mouth 2 (two) times daily.         . niacin 500 MG tablet      500 mg. Take one tablet in the morning, take one tablet in the afternoon and take one tablet at night         . nitroGLYCERIN (NITROSTAT) 0.4 MG SL tablet   Sublingual   Place 0.4 mg under the tongue as needed for chest pain.         Marland Kitchen ofloxacin (OCUFLOX) 0.3 % ophthalmic solution               . omeprazole (PRILOSEC) 20 MG capsule   Oral   Take 20 mg by mouth 2 (two) times daily before a meal.         . senna-docusate (SENOKOT-S) 8.6-50 MG per  tablet   Oral   Take 1 tablet by mouth. 4 TIMES DAILY         . traMADol (ULTRAM) 50 MG tablet   Oral   Take 50 mg by mouth every 6 (six) hours as needed.         . vitamin C (ASCORBIC ACID) 500 MG tablet   Oral   Take 500 mg by mouth daily.         . Zoledronic Acid (RECLAST IV)   Intravenous   Inject into the vein. Injection once yearly           Allergies Review of patient's allergies indicates no known allergies.  Family History  Problem Relation Age of Onset  . Cancer Father     Social History Social History  Substance Use Topics  . Smoking status: Former Smoker    Quit date: 06/26/1990  . Smokeless tobacco: Never Used  . Alcohol Use: No    Review of Systems Constitutional: No fever. Positive shaking chills. Positive lightheadedness. Negative syncope. Eyes: No visual changes. ENT: No sore throat. Positive congestion without rhinorrhea. Cardiovascular: Positive central chest pain, without palpitations. Respiratory: Positive exertional shortness of breath.  No cough. Gastrointestinal: No abdominal pain.  No nausea, no vomiting.  No diarrhea.  No constipation. Genitourinary: Negative for dysuria. Musculoskeletal: Negative for back pain. Skin: Negative for rash. Neurological: Negative for headaches, focal weakness or numbness.  10-point ROS otherwise negative.  ____________________________________________   PHYSICAL EXAM:  VITAL SIGNS: ED Triage Vitals  Enc Vitals Group     BP 11/03/15 1122 113/65 mmHg     Pulse Rate 11/03/15 1122 76     Resp 11/03/15 1122 23     Temp 11/03/15 1122 98.3 F (36.8 C)     Temp Source 11/03/15 1122 Oral     SpO2 11/03/15 1122 98 %     Weight 11/03/15 1122 202 lb (91.627 kg)     Height 11/03/15 1122 5\' 8"  (1.727 m)     Head Cir --      Peak Flow --  Pain Score 11/03/15 1123 8     Pain Loc --      Pain Edu? --      Excl. in [place]? --     Constitutional: Alert and oriented. Well appearing and in no acute  distress. Answer question appropriately. Eyes: Conjunctivae are normal.  EOMI. no scleral icterus. Head: Atraumatic. Nose: Positive congestion without rhinorrhea. Mouth/Throat: Mucous membranes are moist.  Neck: No stridor.  Supple.  No JVD. Cardiovascular: Normal rate, regular rhythm. No murmurs, rubs or gallops.  Respiratory: Normal respiratory effort.  No retractions. Rales in the right lower lung base. Good air exchange. Able to speak in full sentences. Gastrointestinal: Soft and nontender. No distention. No peritoneal signs. Musculoskeletal: No LE edema. No palpable cords or tenderness to palpation in the calves. Neurologic:  Normal speech and language. No gross focal neurologic deficits are appreciated.  Skin:  Skin is warm, dry and intact. No rash noted. Psychiatric: Mood and affect are normal. Speech and behavior are normal.  Normal judgement.  ____________________________________________   LABS (all labs ordered are listed, but only abnormal results are displayed)  Labs Reviewed  CBC - Abnormal; Notable for the following:    WBC 14.6 (*)    RBC 4.23 (*)    Platelets 117 (*)    All other components within normal limits  CULTURE, BLOOD (ROUTINE X 2)  CULTURE, BLOOD (ROUTINE X 2)  BASIC METABOLIC PANEL  TROPONIN I   ____________________________________________  EKG  ED ECG REPORT I, Eula Listen, the attending physician, personally viewed and interpreted this ECG.   Date: 11/03/2015  EKG Time: 1135  Rate: 79  Rhythm: normal sinus rhythm  Axis: Normal  Intervals:first-degree A-V block   ST&T Change: No ST elevation. No ischemic changes.  ____________________________________________  RADIOLOGY  Dg Chest 2 View  11/03/2015  CLINICAL DATA:  75 year old male with acute shortness of breath and chills. EXAM: CHEST  2 VIEW COMPARISON:  09/07/2011 and prior chest radiographs FINDINGS: Cardiomegaly and CABG changes noted. Right lower lobe airspace disease is  compatible with pneumonia. Possible right middle lobe airspace disease may be present. There is no evidence of pneumothorax or pleural effusion. Thoracic compression fracture and vertebral augmentation changes noted. IMPRESSION: Right lower lobe and possible right middle lobe airspace disease compatible with pneumonia. Radiographic follow-up to resolution is recommended. Cardiomegaly. Electronically Signed   By: Margarette Canada M.D.   On: 11/03/2015 11:56    ____________________________________________   PROCEDURES  Procedure(s) performed: None  Critical Care performed: No ____________________________________________   INITIAL IMPRESSION / ASSESSMENT AND PLAN / ED COURSE  Pertinent labs & imaging results that were available during my care of the patient were reviewed by me and considered in my medical decision making (see chart for details).  75 y.o. male presenting with shaking chills, shortness of breath, and infiltrate on his chest x-ray which is consistent with a right lower lobe pneumonia. The patient did have hypoxia home and therefore warrants admission for further treatment and evaluation.  ____________________________________________  FINAL CLINICAL IMPRESSION(S) / ED DIAGNOSES  Final diagnoses:  CAP (community acquired pneumonia)  Hypoxia  Chest pain, unspecified chest pain type      NEW MEDICATIONS STARTED DURING THIS VISIT:  New Prescriptions   No medications on file     Eula Listen, MD 11/03/15 1241

## 2015-11-04 LAB — TROPONIN I
TROPONIN I: 9.33 ng/mL — AB (ref <0.031)
Troponin I: 8.89 ng/mL — ABNORMAL HIGH (ref <0.031)
Troponin I: 9.75 ng/mL — ABNORMAL HIGH (ref <0.031)
Troponin I: 10.27 ng/mL — ABNORMAL HIGH (ref <0.031)

## 2015-11-04 LAB — CBC
HEMATOCRIT: 36.5 % — AB (ref 40.0–52.0)
Hemoglobin: 12.4 g/dL — ABNORMAL LOW (ref 13.0–18.0)
MCH: 33.4 pg (ref 26.0–34.0)
MCHC: 34 g/dL (ref 32.0–36.0)
MCV: 98.2 fL (ref 80.0–100.0)
Platelets: 95 10*3/uL — ABNORMAL LOW (ref 150–440)
RBC: 3.72 MIL/uL — ABNORMAL LOW (ref 4.40–5.90)
RDW: 13.2 % (ref 11.5–14.5)
WBC: 14.3 10*3/uL — ABNORMAL HIGH (ref 3.8–10.6)

## 2015-11-04 LAB — LIPID PANEL
CHOL/HDL RATIO: 1.8 ratio
Cholesterol: 98 mg/dL (ref 0–200)
HDL: 56 mg/dL (ref >40)
LDL CALC: 29 mg/dL (ref 0–99)
TRIGLYCERIDES: 64 mg/dL (ref <150)
VLDL: 13 mg/dL (ref 0–40)

## 2015-11-04 LAB — BASIC METABOLIC PANEL
Anion gap: 4 — ABNORMAL LOW (ref 5–15)
BUN: 12 mg/dL (ref 6–20)
CHLORIDE: 110 mmol/L (ref 101–111)
CO2: 27 mmol/L (ref 22–32)
Calcium: 8.4 mg/dL — ABNORMAL LOW (ref 8.9–10.3)
Creatinine, Ser: 0.8 mg/dL (ref 0.61–1.24)
GFR calc Af Amer: >60 mL/min (ref >60)
GFR calc non Af Amer: >60 mL/min (ref >60)
GLUCOSE: 102 mg/dL — AB (ref 65–99)
POTASSIUM: 3.4 mmol/L — AB (ref 3.5–5.1)
Sodium: 141 mmol/L (ref 135–145)

## 2015-11-04 LAB — CKMB (ARMC ONLY): CK, MB: 2.5 ng/mL (ref 0.5–5.0)

## 2015-11-04 LAB — STREP PNEUMONIAE URINARY ANTIGEN: STREP PNEUMO URINARY ANTIGEN: NEGATIVE

## 2015-11-04 LAB — HEPARIN LEVEL (UNFRACTIONATED): HEPARIN UNFRACTIONATED: 0.55 [IU]/mL (ref 0.30–0.70)

## 2015-11-04 LAB — CK: CK TOTAL: 382 U/L (ref 49–397)

## 2015-11-04 MED ORDER — HYDROCODONE-ACETAMINOPHEN 5-325 MG PO TABS
1.0000 | ORAL_TABLET | ORAL | Status: DC | PRN
  Administered 2015-11-04 – 2015-11-05 (×4): 1 via ORAL
  Administered 2015-11-06: 2 via ORAL
  Administered 2015-11-06 – 2015-11-07 (×2): 1 via ORAL
  Administered 2015-11-07: 2 via ORAL
  Filled 2015-11-04: qty 2
  Filled 2015-11-04 (×2): qty 1
  Filled 2015-11-04: qty 2
  Filled 2015-11-04 (×4): qty 1

## 2015-11-04 MED ORDER — TRAMADOL HCL 50 MG PO TABS
50.0000 mg | ORAL_TABLET | Freq: Once | ORAL | Status: AC
  Administered 2015-11-04: 50 mg via ORAL
  Filled 2015-11-04: qty 1

## 2015-11-04 NOTE — Progress Notes (Signed)
Patient is complaining of back pain unrelieved with Tylenol 650 mg PRN. Dr. Marcille Blanco notified with a new order for Tramadol 50 mg oral x 1 dose. Will continue to monitor.

## 2015-11-04 NOTE — Progress Notes (Signed)
ANTICOAGULATION CONSULT NOTE - Initial Consult  Pharmacy Consult for heparin drip Indication: chest pain/ACS  No Known Allergies  Patient Measurements: Height: 5\' 8"  (172.7 cm) Weight: 202 lb (91.627 kg) IBW/kg (Calculated) : 68.4 Heparin Dosing Weight: 87 kg  Vital Signs: Temp: 98.3 F (36.8 C) (02/11 1955) Temp Source: Oral (02/11 1955) BP: 111/54 mmHg (02/11 1955) Pulse Rate: 72 (02/11 1955)   Recent Labs  11/03/15 1134 11/03/15 1647 11/03/15 1816 11/03/15 2319  HGB 14.1  --   --   --   HCT 41.5  --   --   --   PLT 117*  --   --   --   APTT  --   --  122*  --   LABPROT  --   --  15.8*  --   INR  --   --  1.24  --   HEPARINUNFRC  --   --   --  0.46  CREATININE 0.99  --   --   --   TROPONINI 9.28* 3.26*  --  8.89*    Estimated Creatinine Clearance: 71.9 mL/min (by C-G formula based on Cr of 0.99).   Medical History: Past Medical History  Diagnosis Date  . Skin cancer   . History of open heart surgery   . CAD (coronary artery disease)   . Arthritis   . DJD (degenerative joint disease)    Assessment 75 yo male seen in ED with SOB, chills and chest pain.  Patient was diagnosis with PNA and found to have elevated troponin I of 9.28. Pharmacy consulted for dosing and monitoring of heparin for ACS.  Goal of Therapy:  Heparin level 0.3-0.7 units/ml Monitor platelets by anticoagulation protocol: Yes   Plan:  Give 4000 units bolus x 1 Start heparin infusion at 1050 units/hr Check anti-Xa level in 8 hours and daily while on heparin Continue to monitor H&H and platelets  APTT and PT/INR ordered   2/11 23:30 heparin level 0.46. Recheck in 8 hours to confirm.  Sim Boast, PharmD, BCPS  11/04/2015

## 2015-11-04 NOTE — Consult Note (Signed)
[place]  CARDIOLOGY CONSULT NOTE  Patient ID: Justin [name] MRN: [id number] DOB/AGE: 04-28-1941 75 y.o.  Admit date: 11/03/2015 Referring Physician Dr. Darvin Neighbours Primary Physician MFM Primary Cardiologist Dr. Otto Herb Reason for Consultation nstemi  HPI: 75 yo male with hisstory of cad s/p cabg at [facility] in 1992 with lima to lad, svg to lpl and svg to om. Cath in 2010 revealed 100% lad,lcx and rca with patent lima to lad, patent svg to lpl and occluded svg to om. Treated medically. Has history of hyperlipidemia, htn, sleep apnea and pvcs. Last seen at [facility] 10/30/15 and was doing well .  CXR revealed no pulmonary edema,. Echo done then revealed ef 55% with la of 4.9 cm, trivial tr, with trivial mr and no ai or as. Has had several days of worsening sob and presented to the er with complaints of sob and chest tightness seveal days earlier. CXR revealed  Roll and possible rml pna. EKG was unremarkable. Troponin was drawn per er protocol which revealed level to be 9.28 with subsequent values of 3.26 and 8.89. Hemodynamically stable. Less sob and no chest pain other than discomfort over lima take down site that is chronic.  Review of Systems  Constitutional: Positive for chills.  Eyes: Negative.   Respiratory: Positive for cough and shortness of breath.   Cardiovascular: Negative.   Gastrointestinal: Positive for nausea and vomiting.  Genitourinary: Negative.   Musculoskeletal: Negative.   Neurological: Positive for headaches.  Endo/Heme/Allergies: Negative.   Psychiatric/Behavioral: Negative.     Past Medical History  Diagnosis Date  . Skin cancer   . History of open heart surgery   . CAD (coronary artery disease)   . Arthritis   . DJD (degenerative joint disease)     Family History  Problem Relation Age of Onset  . Cancer Father     Social History   Social History  . Marital Status: Married    Spouse Name: N/A  . Number of Children: N/A   . Years of Education: N/A   Occupational History  . Not on file.   Social History Main Topics  . Smoking status: Former Smoker    Quit date: 06/26/1990  . Smokeless tobacco: Never Used  . Alcohol Use: No  . Drug Use: No  . Sexual Activity: Not on file   Other Topics Concern  . Not on file   Social History Narrative    Past Surgical History  Procedure Laterality Date  . Hernia removed    . Open heart surgery    . Skin cancer removed    . Eye surgery       Facility-administered medications prior to admission  Medication Dose Route Frequency Provider Last Rate Last Dose  . triamcinolone acetonide (KENALOG) 10 MG/ML injection 10 mg  10 mg Other Once Wallene Huh, DPM       Prescriptions prior to admission  Medication Sig Dispense Refill Last Dose  . acetaminophen (TYLENOL) 325 MG tablet Take 650 mg by mouth 3 (three) times daily.    11/02/2015 at Unknown time  . aspirin 81 MG tablet Take 81 mg by mouth daily.   11/02/2015 at Unknown time  . atorvastatin (LIPITOR) 80 MG tablet Take 40 mg by mouth at bedtime.    11/02/2015 at Unknown time  . Calcium Carb-Cholecalciferol (CALCIUM 600 + D PO) Take 1 tablet by mouth 2 (two) times daily. Take morning and at noon.   11/02/2015 at Unknown  time  . cholecalciferol (VITAMIN D) 1000 units tablet Take 1,000 Units by mouth 3 (three) times daily.   11/02/2015 at Unknown time  . fexofenadine (ALLEGRA) 180 MG tablet Take 180 mg by mouth daily before breakfast.    11/03/2015 at Unknown time  . fluticasone (FLONASE) 50 MCG/ACT nasal spray Place 2 sprays into both nostrils at bedtime.   11/02/2015 at Unknown time  . isosorbide mononitrate (IMDUR) 60 MG 24 hr tablet Take 60 mg by mouth daily.   11/03/2015 at Unknown time  . LACTOBACILLUS PO Take 2 tablets by mouth every morning.    11/02/2015 at Unknown time  . Magnesium 400 MG CAPS Take 400 mg by mouth 2 (two) times daily.    11/02/2015 at Unknown time  . metoprolol (LOPRESSOR) 50 MG tablet Take 50 mg by  mouth 2 (two) times daily.   11/02/2015 at 0730  . niacin 500 MG tablet Take 500 mg by mouth 3 (three) times daily. Take one tablet in the morning, take one tablet in the afternoon and take one tablet at night   11/02/2015 at Unknown time  . nitroGLYCERIN (NITROSTAT) 0.4 MG SL tablet Place 0.4 mg under the tongue every 5 (five) minutes x 3 doses as needed for chest pain. If no relief call MD or go to emergency room.   unknown  . Omega-3 Fatty Acids (FISH OIL) 1000 MG CAPS Take 1,000 mg by mouth 2 (two) times daily.   11/03/2015 at Unknown time  . omeprazole (PRILOSEC) 20 MG capsule Take 20 mg by mouth 2 (two) times daily before a meal.   11/03/2015 at Unknown time  . senna-docusate (SENOKOT-S) 8.6-50 MG per tablet Take 2 tablets by mouth 2 (two) times daily. 4 TIMES DAILY   11/02/2015 at Unknown time  . traMADol (ULTRAM) 50 MG tablet Take 50 mg by mouth 3 (three) times daily.    11/03/2015 at Unknown time  . vitamin C (ASCORBIC ACID) 500 MG tablet Take 500 mg by mouth daily.   11/02/2015 at Unknown time  . Zoledronic Acid (RECLAST IV) Inject into the vein. Injection once yearly   07/24/2015    Physical Exam: Blood pressure 124/57, pulse 60, temperature 98 F (36.7 C), temperature source Oral, resp. rate 18, height 5\' 8"  (1.727 m), weight 91.717 kg (202 lb 3.2 oz), SpO2 93 %.    General appearance: alert and cooperative Head: Normocephalic, without obvious abnormality, atraumatic Resp: diminished breath sounds RLL and RML Chest wall: left sided chest wall tenderness Cardio: regular rate and rhythm GI: soft, non-tender; bowel sounds normal; no masses,  no organomegaly Extremities: extremities normal, atraumatic, no cyanosis or edema Pulses: 2+ and symmetric Neurologic: Grossly normal Labs:   Lab Results  Component Value Date   WBC 14.3* 11/04/2015   HGB 12.4* 11/04/2015   HCT 36.5* 11/04/2015   MCV 98.2 11/04/2015   PLT 95* 11/04/2015    Recent Labs Lab 11/04/15 0710  NA 141  K 3.4*  CL  110  CO2 27  BUN 12  CREATININE 0.80  CALCIUM 8.4*  GLUCOSE 102*   Lab Results  Component Value Date   TROPONINI 8.89* 11/03/2015      Radiology: RLL and RML pneumonia EKG: NSR with no ischemia or significant change from baseline  ASSESSMENT AND PLAN:  75 yo male with history of cad s/p cabg in 1992 with cardaic cath in 2010 showing 100% native vessels with patent lima to lad, patent svg to lpl and occluded svg to om  treated medically. He has been doing fairly well and was seen 5 days ago in cards clinic [facility] with echo showing normal ef with no significant valvular disease and cxr that was unremarkable. He is treated with beta blockers, long acting nitrates, asa, and high intensity statin. WIll repeat echo today to compare with one done 5 days ago to look for wall motion abnormality. Will repeat troponin to follow trend. WIll check ck/mb. Will discuss with her primary cardiologist at [facility]. Will likely need cardiac cath when improved from pneumonia. Continue with iv heparin.  Signed: Teodoro Spray MD, [facility] 11/04/2015, 8:58 AM

## 2015-11-04 NOTE — Progress Notes (Signed)
[place] [facility] [place] NAME: Justin [name]    MR#:  [id number]  DATE OF BIRTH:  1941/08/29  SUBJECTIVE:  CHIEF COMPLAINT:   Chief Complaint  Patient presents with  . Shortness of Breath   - Patient admitted with dyspnea, fevers and chills and also chest pain. -Chest x-ray with right-sided pneumonia, also elevated troponin. -Breathing is improved chest pain is much better now. Remains on heparin drip and also IV antibiotics. -Appreciate cardiology input  REVIEW OF SYSTEMS:  Review of Systems  Constitutional: Negative for fever and chills.  HENT: Negative for ear discharge, ear pain and nosebleeds.   Respiratory: Positive for shortness of breath. Negative for cough and wheezing.   Cardiovascular: Positive for chest pain. Negative for palpitations.  Gastrointestinal: Negative for nausea, vomiting, abdominal pain, diarrhea and constipation.  Genitourinary: Negative for dysuria.  Musculoskeletal: Positive for back pain.  Neurological: Negative for dizziness, sensory change, speech change, focal weakness, seizures, weakness and headaches.  Psychiatric/Behavioral: Negative for depression.    DRUG ALLERGIES:  No Known Allergies  VITALS:  Blood pressure 126/56, pulse 68, temperature 97.9 F (36.6 C), temperature source Oral, resp. rate 20, height 5\' 8"  (1.727 m), weight 91.717 kg (202 lb 3.2 oz), SpO2 94 %.  PHYSICAL EXAMINATION:  Physical Exam  GENERAL:  75 y.o.-year-old patient lying in the bed with no acute distress.  EYES: Pupils equal, round, reactive to light and accommodation. No scleral icterus. Extraocular muscles intact.  HEENT: Head atraumatic, normocephalic. Oropharynx and nasopharynx clear.  NECK:  Supple, no jugular venous distention. No thyroid enlargement, no tenderness.  LUNGS: Normal breath sounds bilaterally, no wheezing, rales,rhonchi or crepitation. No use of accessory muscles of respiration. Decreased bibasilar  breath sounds CARDIOVASCULAR: S1, S2 normal. No rubs, or gallops. 3/6 systolic murmur present ABDOMEN: Soft, nontender, nondistended. Bowel sounds present. No organomegaly or mass.  EXTREMITIES: No pedal edema, cyanosis, or clubbing.  NEUROLOGIC: Cranial nerves II through XII are intact. Muscle strength 5/5 in all extremities. Sensation intact. Gait not checked.  PSYCHIATRIC: The patient is alert and oriented x 3.  SKIN: No obvious rash, lesion, or ulcer.    LABORATORY PANEL:   CBC  Recent Labs Lab 11/04/15 0710  WBC 14.3*  HGB 12.4*  HCT 36.5*  PLT 95*   ------------------------------------------------------------------------------------------------------------------  Chemistries   Recent Labs Lab 11/04/15 0710  NA 141  K 3.4*  CL 110  CO2 27  GLUCOSE 102*  BUN 12  CREATININE 0.80  CALCIUM 8.4*   ------------------------------------------------------------------------------------------------------------------  Cardiac Enzymes  Recent Labs Lab 11/04/15 0949  TROPONINI 9.75*   ------------------------------------------------------------------------------------------------------------------  RADIOLOGY:  Dg Chest 2 View  11/03/2015  CLINICAL DATA:  75 year old male with acute shortness of breath and chills. EXAM: CHEST  2 VIEW COMPARISON:  09/07/2011 and prior chest radiographs FINDINGS: Cardiomegaly and CABG changes noted. Right lower lobe airspace disease is compatible with pneumonia. Possible right middle lobe airspace disease may be present. There is no evidence of pneumothorax or pleural effusion. Thoracic compression fracture and vertebral augmentation changes noted. IMPRESSION: Right lower lobe and possible right middle lobe airspace disease compatible with pneumonia. Radiographic follow-up to resolution is recommended. Cardiomegaly. Electronically Signed   By: Margarette Canada M.D.   On: 11/03/2015 11:56    EKG:   Orders placed or performed during the hospital  encounter of 11/03/15  . ED EKG within 10 minutes  . ED EKG within 10 minutes  . EKG 12-Lead  . EKG 12-Lead  .  ED EKG  . ED EKG    ASSESSMENT AND PLAN:   75 year old male with past medical history significant for coronary reactive disease status post CABG, degenerative arthritis, hyperlipidemia and GERD presents to the hospital secondary to worsening dyspnea and also chest pain.  #1 community-acquired pneumonia-right middle lobe and also lower lobe pneumonia noted on the chest x-ray associated with hypoxic respiratory failure on presentation. -Hypoxia has resolved. Currently remains on room air. -Blood cultures are pending. Started on Levaquin for pneumonia. -Breathing is much improved.  #2 NSTEMI-recent echocardiogram 1 week ago was normal as outpatient. Troponins are significantly elevated and also associated with chest heaviness. -No EKG changes. Appreciate cardiology consult. -Due to inconsistent elevation of troponins, repeat troponins are ordered. Follow-up echo to compare it with last week's echocardiogram. -Continue IV heparin drip for now. Continue all the cardiac medications. -Patient might likely need to cardiac catheterization this admission.  #3 coronary artery disease-status post CABG. Currently on heparin drip and management as mentioned above. -Continue all cardiac medications. On aspirin, metoprolol, statin, Imdur.  #4 chronic low back pain-adjust pain medications.  #5 GERD-on Protonix  #6 DVT prophylaxis-on IV heparin     All the records are reviewed and case discussed with Care Management/Social Workerr. Management plans discussed with the patient, family and they are in agreement.  CODE STATUS: Full Code  TOTAL TIME TAKING CARE OF THIS PATIENT: 37 minutes.   POSSIBLE D/C IN 2-3 DAYS, DEPENDING ON CLINICAL CONDITION.   Gladstone Lighter M.D on 11/04/2015 at 12:38 PM  Between 7am to 6pm - Pager - [phone]  After 6pm go to [email or web address] - password  [facility]  [facility]  Office  [phone]  CC: Primary care physician; Rusty Aus., MD

## 2015-11-04 NOTE — Care Management (Signed)
Presents from home with shortness of breath.  Patient has home cpap.  When woke up short of breath his room air sat at rest was 80% then 70% with exertion. CXR shows pneumonia.  He has history previous CABG and now with nstemi.  He is followed at [facility] for cardiac issues.  His current 02 requirements are acute.  At present, do not see documentation of any chronic diagnosis that would meet criteria for chronic oxygen.

## 2015-11-04 NOTE — Progress Notes (Signed)
ANTICOAGULATION CONSULT NOTE - follow up [place] for heparin drip Indication: chest pain/ACS  No Known Allergies  Patient Measurements: Height: 5\' 8"  (172.7 cm) Weight: 202 lb 3.2 oz (91.717 kg) IBW/kg (Calculated) : 68.4 Heparin Dosing Weight: 87 kg  Vital Signs: Temp: 98 F (36.7 C) (02/12 0520) Temp Source: Oral (02/12 0520) BP: 124/57 mmHg (02/12 0746) Pulse Rate: 60 (02/12 0520)   Recent Labs  11/03/15 1134 11/03/15 1647 11/03/15 1816 11/03/15 2319 11/04/15 0710  HGB 14.1  --   --   --  12.4*  HCT 41.5  --   --   --  36.5*  PLT 117*  --   --   --  95*  APTT  --   --  122*  --   --   LABPROT  --   --  15.8*  --   --   INR  --   --  1.24  --   --   HEPARINUNFRC  --   --   --  0.46 0.55  CREATININE 0.99  --   --   --  0.80  TROPONINI 9.28* 3.26*  --  8.89*  --     Estimated Creatinine Clearance: 89 mL/min (by C-G formula based on Cr of 0.8).   Medical History: Past Medical History  Diagnosis Date  . Skin cancer   . History of open heart surgery   . CAD (coronary artery disease)   . Arthritis   . DJD (degenerative joint disease)    Assessment 75 yo male seen in ED with SOB, chills and chest pain.  Patient was diagnosis with PNA and found to have elevated troponin I of 9.28. Pharmacy consulted for dosing and monitoring of heparin for ACS.  Goal of Therapy:  Heparin level 0.3-0.7 units/ml Monitor platelets by anticoagulation protocol: Yes   Plan:  Give 4000 units bolus x 1 Start heparin infusion at 1050 units/hr Check anti-Xa level in 8 hours and daily while on heparin Continue to monitor H&H and platelets  APTT and PT/INR ordered   2/11 23:30 heparin level 0.46. Recheck in 8 hours to confirm.  2/12 Heparin level at 0710= 0.55. Continue current rate of 1050 units/hr. F/u Heparin level with am labs.  Chinita Greenland PharmD Clinical Pharmacist 11/04/2015 8:39 AM

## 2015-11-05 ENCOUNTER — Inpatient Hospital Stay
Admit: 2015-11-05 | Discharge: 2015-11-05 | Disposition: A | Discharge status: Home / Self Care | Payer: Medicare Other | Attending: Internal Medicine | Admitting: Internal Medicine

## 2015-11-05 LAB — BASIC METABOLIC PANEL
Anion gap: 5 (ref 5–15)
BUN: 10 mg/dL (ref 6–20)
CALCIUM: 8.5 mg/dL — AB (ref 8.9–10.3)
CO2: 25 mmol/L (ref 22–32)
CREATININE: 0.91 mg/dL (ref 0.61–1.24)
Chloride: 109 mmol/L (ref 101–111)
GFR calc Af Amer: >60 mL/min (ref >60)
GFR calc non Af Amer: >60 mL/min (ref >60)
GLUCOSE: 112 mg/dL — AB (ref 65–99)
POTASSIUM: 3.6 mmol/L (ref 3.5–5.1)
Sodium: 139 mmol/L (ref 135–145)

## 2015-11-05 LAB — TROPONIN I
Troponin I: 9.04 ng/mL — ABNORMAL HIGH (ref <0.031)
Troponin I: 9.23 ng/mL — ABNORMAL HIGH (ref <0.031)

## 2015-11-05 LAB — CBC
HCT: 36.9 % — ABNORMAL LOW (ref 40.0–52.0)
HEMOGLOBIN: 12.5 g/dL — AB (ref 13.0–18.0)
MCH: 33.8 pg (ref 26.0–34.0)
MCHC: 33.8 g/dL (ref 32.0–36.0)
MCV: 99.9 fL (ref 80.0–100.0)
PLATELETS: 92 10*3/uL — AB (ref 150–440)
RBC: 3.7 MIL/uL — ABNORMAL LOW (ref 4.40–5.90)
RDW: 13.3 % (ref 11.5–14.5)
WBC: 9.8 10*3/uL (ref 3.8–10.6)

## 2015-11-05 LAB — HEPARIN LEVEL (UNFRACTIONATED): HEPARIN UNFRACTIONATED: 0.41 [IU]/mL (ref 0.30–0.70)

## 2015-11-05 MED ORDER — SENNOSIDES-DOCUSATE SODIUM 8.6-50 MG PO TABS
2.0000 | ORAL_TABLET | Freq: Two times a day (BID) | ORAL | Status: DC
  Administered 2015-11-05 – 2015-11-07 (×5): 2 via ORAL
  Filled 2015-11-05 (×5): qty 2

## 2015-11-05 MED ORDER — LEVOFLOXACIN 750 MG PO TABS
750.0000 mg | ORAL_TABLET | Freq: Every day | ORAL | Status: DC
  Administered 2015-11-06 – 2015-11-08 (×3): 750 mg via ORAL
  Filled 2015-11-05 (×3): qty 1

## 2015-11-05 MED ORDER — PANTOPRAZOLE SODIUM 40 MG PO TBEC
40.0000 mg | DELAYED_RELEASE_TABLET | Freq: Two times a day (BID) | ORAL | Status: DC
  Administered 2015-11-05 – 2015-11-08 (×6): 40 mg via ORAL
  Filled 2015-11-05 (×6): qty 1

## 2015-11-05 NOTE — Progress Notes (Signed)
ANTICOAGULATION CONSULT NOTE - follow up [place] for heparin drip Indication: chest pain/ACS  No Known Allergies  Patient Measurements: Height: 5\' 8"  (172.7 cm) Weight: 202 lb 3.2 oz (91.717 kg) IBW/kg (Calculated) : 68.4 Heparin Dosing Weight: 87 kg  Vital Signs: Temp: 97.8 F (36.6 C) (02/13 0543) Temp Source: Oral (02/13 0543) BP: 121/69 mmHg (02/13 0543) Pulse Rate: 58 (02/13 0543)   Recent Labs  11/03/15 1134  11/03/15 1816 11/03/15 2319 11/04/15 0710 11/04/15 0949 11/04/15 1537 11/04/15 2145 11/05/15 0419  HGB 14.1  --   --   --  12.4*  --   --   --  12.5*  HCT 41.5  --   --   --  36.5*  --   --   --  36.9*  PLT 117*  --   --   --  95*  --   --   --  92*  APTT  --   --  122*  --   --   --   --   --   --   LABPROT  --   --  15.8*  --   --   --   --   --   --   INR  --   --  1.24  --   --   --   --   --   --   HEPARINUNFRC  --   --   --  0.46 0.55  --   --   --  0.41  CREATININE 0.99  --   --   --  0.80  --   --   --  0.91  CKTOTAL  --   --   --   --   --  382  --   --   --   CKMB  --   --   --   --   --  2.5  --   --   --   TROPONINI 9.28*  < >  --  8.89*  --  9.75* 9.33* 10.27* 9.04*  < > = values in this interval not displayed.  Estimated Creatinine Clearance: 78.3 mL/min (by C-G formula based on Cr of 0.91).   Medical History: Past Medical History  Diagnosis Date  . Skin cancer   . History of open heart surgery   . CAD (coronary artery disease)   . Arthritis   . DJD (degenerative joint disease)    Assessment 75 yo male seen in ED with SOB, chills and chest pain.  Patient was diagnosis with PNA and found to have elevated troponin I of 9.28. Pharmacy consulted for dosing and monitoring of heparin for ACS.  Goal of Therapy:  Heparin level 0.3-0.7 units/ml Monitor platelets by anticoagulation protocol: Yes   Plan:  Give 4000 units bolus x 1 Start heparin infusion at 1050 units/hr Check anti-Xa level in 8 hours and daily while on  heparin Continue to monitor H&H and platelets  APTT and PT/INR ordered   2/11 23:30 heparin level 0.46. Recheck in 8 hours to confirm.  2/12 Heparin level at 0710= 0.55. Continue current rate of 1050 units/hr. F/u Heparin level with am labs.  2/13 AM heparin level 0.41. Continue current regimen. Recheck heparin level and CBC in AM.  Chinita Greenland PharmD Clinical Pharmacist 11/05/2015 6:22 AM

## 2015-11-05 NOTE — Care Management Important Message (Signed)
Important Message  Patient Details  Name: Justin [name] MRN: [id number] Date of Birth: 1941-07-17   Medicare Important Message Given:  Yes    [name] 11/05/2015, 2:55 PM

## 2015-11-05 NOTE — Progress Notes (Signed)
Pharmacy Antibiotic Note- Day 3  Justin [name] is a 75 y.o. male admitted on 11/03/2015 with pneumonia and ACS.  Pharmacy has been consulted for levofloxacin dosing. Patient received levofloxacin 750mg  X1 in ED at 1400  Plan: Will continue levofloxacin 750mg  PO daily for CAP. Recommended duration of therapy 5-7 days.    Height: 5\' 8"  (172.7 cm) Weight: 201 lb 6.4 oz (91.354 kg) IBW/kg (Calculated) : 68.4  Temp (24hrs), Avg:98.1 F (36.7 C), Min:97.8 F (36.6 C), Max:98.5 F (36.9 C)   Recent Labs Lab 11/03/15 1134 11/04/15 0710 11/05/15 0419  WBC 14.6* 14.3* 9.8  CREATININE 0.99 0.80 0.91    Estimated Creatinine Clearance: 78.2 mL/min (by C-G formula based on Cr of 0.91).    No Known Allergies  Antimicrobials this admission: 02/11>> levofloxacin   Dose adjustments this admission: N/A  Microbiology results: 02/11 BCx: pending 02/11 Sputum: pending   Thank you for allowing pharmacy to be a part of this patient's care.  Nancy Fetter, PharmD Pharmacy Resident 11/05/2015 9:51 PM

## 2015-11-05 NOTE — Progress Notes (Signed)
[place]  SUBJECTIVE: still a little sob. No chest pain   Filed Vitals:   11/04/15 1122 11/04/15 2120 11/05/15 0543 11/05/15 1219  BP: 126/56 131/75 121/69 119/65  Pulse: 68 71 58 59  Temp: 97.9 F (36.6 C) 98.5 F (36.9 C) 97.8 F (36.6 C) 98.1 F (36.7 C)  TempSrc: Oral Oral Oral Oral  Resp: 20  18 18   Height:      Weight:   91.354 kg (201 lb 6.4 oz)   SpO2: 94% 95% 94% 94%    Intake/Output Summary (Last 24 hours) at 11/05/15 1353 Last data filed at 11/05/15 1225  Gross per 24 hour  Intake  709.9 ml  Output   2825 ml  Net -2115.1 ml    LABS: Basic Metabolic Panel:  Recent Labs  11/04/15 0710 11/05/15 0419  NA 141 139  K 3.4* 3.6  CL 110 109  CO2 27 25  GLUCOSE 102* 112*  BUN 12 10  CREATININE 0.80 0.91  CALCIUM 8.4* 8.5*   Liver Function Tests: No results for input(s): AST, ALT, ALKPHOS, BILITOT, PROT, ALBUMIN in the last 72 hours. No results for input(s): LIPASE, AMYLASE in the last 72 hours. CBC:  Recent Labs  11/04/15 0710 11/05/15 0419  WBC 14.3* 9.8  HGB 12.4* 12.5*  HCT 36.5* 36.9*  MCV 98.2 99.9  PLT 95* 92*   Cardiac Enzymes:  Recent Labs  11/04/15 0949  11/04/15 2145 11/05/15 0419 11/05/15 1024  CKTOTAL 382  --   --   --   --   CKMB 2.5  --   --   --   --   TROPONINI 9.75*  < > 10.27* 9.04* 9.23*  < > = values in this interval not displayed. BNP: Invalid input(s): POCBNP D-Dimer: No results for input(s): DDIMER in the last 72 hours. Hemoglobin A1C: No results for input(s): HGBA1C in the last 72 hours. Fasting Lipid Panel:  Recent Labs  11/04/15 1537  CHOL 98  HDL 56  LDLCALC 29  TRIG 64  CHOLHDL 1.8   Thyroid Function Tests: No results for input(s): TSH, T4TOTAL, T3FREE, THYROIDAB in the last 72 hours.  Invalid input(s): FREET3 Anemia Panel: No results for input(s): VITAMINB12, FOLATE, FERRITIN, TIBC, IRON, RETICCTPCT in the last 72 hours.   Physical Exam: Blood  pressure 119/65, pulse 59, temperature 98.1 F (36.7 C), temperature source Oral, resp. rate 18, height 5\' 8"  (1.727 m), weight 91.354 kg (201 lb 6.4 oz), SpO2 94 %.    General appearance: alert and cooperative Resp: clear to auscultation bilaterally Cardio: regular rate and rhythm GI: soft, non-tender; bowel sounds normal; no masses,  no organomegaly  TELEMETRY: Reviewed telemetry pt in nsr:  ASSESSMENT AND PLAN:  Active Problems:   Pneumonia-on iv abx. Will continue   NSTEMI (non-ST elevated myocardial infarction) (HCC)-troponin remains elevated at 9-10 range. Somewhat atypical for acs but given anatomy, acs needs considerred. Peri/myocarditis may also be playing a role. EF appears unchanged from echo reported less than 1 week ago. WIll continue treating pna and consider cath prior to discharge, likely in 48 hours. Will discuss with his primary cardiologist at [place]., MD, [facility] 11/05/2015 1:53 PM

## 2015-11-05 NOTE — Progress Notes (Signed)
*  PRELIMINARY RESULTS* Echocardiogram 2D Echocardiogram has been performed.  Justin [name] 11/05/2015, 11:13 AM

## 2015-11-05 NOTE — Care Management (Signed)
Patient has a "Trilogy" at home- not a cpap.  Says he uses it for obstructive sleep apnea and "my brain does not always tell my body to breathe."  Prior to this admission, patient independent in his adls and able to drive.  Denies issues accessing medical care, obtaining medications, transportation.  He has not been out of bed since admission due to need for further cardiac assessment for positive troponins.  Cardiac cath continues to be considered.

## 2015-11-06 ENCOUNTER — Inpatient Hospital Stay: Admit: 2015-11-06 | Payer: Medicare Other

## 2015-11-06 LAB — CBC
HEMATOCRIT: 37.9 % — AB (ref 40.0–52.0)
Hemoglobin: 12.9 g/dL — ABNORMAL LOW (ref 13.0–18.0)
MCH: 33.8 pg (ref 26.0–34.0)
MCHC: 34 g/dL (ref 32.0–36.0)
MCV: 99.2 fL (ref 80.0–100.0)
Platelets: 119 10*3/uL — ABNORMAL LOW (ref 150–440)
RBC: 3.82 MIL/uL — ABNORMAL LOW (ref 4.40–5.90)
RDW: 13.4 % (ref 11.5–14.5)
WBC: 8.2 10*3/uL (ref 3.8–10.6)

## 2015-11-06 LAB — HEPARIN LEVEL (UNFRACTIONATED): Heparin Unfractionated: 0.34 IU/mL (ref 0.30–0.70)

## 2015-11-06 MED ORDER — SODIUM CHLORIDE 0.9 % IV SOLN: 250.0000 mL | INTRAVENOUS | Status: DC | PRN

## 2015-11-06 MED ORDER — SODIUM CHLORIDE 0.9 % WEIGHT BASED INFUSION
1.0000 mL/kg/h | INTRAVENOUS | Status: DC
  Administered 2015-11-07: 1 mL/kg/h via INTRAVENOUS

## 2015-11-06 MED ORDER — SODIUM CHLORIDE 0.9% FLUSH: 3.0000 mL | Freq: Two times a day (BID) | INTRAVENOUS | Status: DC

## 2015-11-06 MED ORDER — SODIUM CHLORIDE 0.9% FLUSH: 3.0000 mL | INTRAVENOUS | Status: DC | PRN

## 2015-11-06 MED ORDER — SODIUM CHLORIDE 0.9 % WEIGHT BASED INFUSION
3.0000 mL/kg/h | INTRAVENOUS | Status: DC
  Administered 2015-11-07: 3 mL/kg/h via INTRAVENOUS

## 2015-11-06 NOTE — Progress Notes (Signed)
[place]  SUBJECTIVE: constipation   Filed Vitals:   11/05/15 1219 11/05/15 2057 11/06/15 0556 11/06/15 0826  BP: 119/65 147/70 142/77 132/81  Pulse: 59 73 67 62  Temp: 98.1 F (36.7 C) 98.5 F (36.9 C) 97.8 F (36.6 C) 98.2 F (36.8 C)  TempSrc: Oral Oral Oral Oral  Resp: 18 16 16 18   Height:      Weight:   89.449 kg (197 lb 3.2 oz)   SpO2: 94% 94% 94% 94%    Intake/Output Summary (Last 24 hours) at 11/06/15 0846 Last data filed at 11/06/15 0558  Gross per 24 hour  Intake    840 ml  Output   3250 ml  Net  -2410 ml    LABS: Basic Metabolic Panel:  Recent Labs  11/04/15 0710 11/05/15 0419  NA 141 139  K 3.4* 3.6  CL 110 109  CO2 27 25  GLUCOSE 102* 112*  BUN 12 10  CREATININE 0.80 0.91  CALCIUM 8.4* 8.5*   Liver Function Tests: No results for input(s): AST, ALT, ALKPHOS, BILITOT, PROT, ALBUMIN in the last 72 hours. No results for input(s): LIPASE, AMYLASE in the last 72 hours. CBC:  Recent Labs  11/05/15 0419 11/06/15 0427  WBC 9.8 8.2  HGB 12.5* 12.9*  HCT 36.9* 37.9*  MCV 99.9 99.2  PLT 92* 119*   Cardiac Enzymes:  Recent Labs  11/04/15 0949  11/04/15 2145 11/05/15 0419 11/05/15 1024  CKTOTAL 382  --   --   --   --   CKMB 2.5  --   --   --   --   TROPONINI 9.75*  < > 10.27* 9.04* 9.23*  < > = values in this interval not displayed. BNP: Invalid input(s): POCBNP D-Dimer: No results for input(s): DDIMER in the last 72 hours. Hemoglobin A1C: No results for input(s): HGBA1C in the last 72 hours. Fasting Lipid Panel:  Recent Labs  11/04/15 1537  CHOL 98  HDL 56  LDLCALC 29  TRIG 64  CHOLHDL 1.8   Thyroid Function Tests: No results for input(s): TSH, T4TOTAL, T3FREE, THYROIDAB in the last 72 hours.  Invalid input(s): FREET3 Anemia Panel: No results for input(s): VITAMINB12, FOLATE, FERRITIN, TIBC, IRON, RETICCTPCT in the last 72 hours.   Physical Exam: Blood pressure 132/81, pulse 62,  temperature 98.2 F (36.8 C), temperature source Oral, resp. rate 18, height 5\' 8"  (1.727 m), weight 89.449 kg (197 lb 3.2 oz), SpO2 94 %.    General appearance: alert and cooperative Resp: clear to auscultation bilaterally Cardio: regular rate and rhythm and regularly irregular rhythm GI: abnormal findings:  constipated  TELEMETRY: Reviewed telemetry pt in nsr:  ASSESSMENT AND PLAN:  Active Problems:   Pneumonia-on abx   NSTEMI (non-ST elevated myocardial infarction) (HCC)-will plan cardiac cath tomorrow.     Teodoro Spray., MD, [facility] 11/06/2015 8:46 AM

## 2015-11-06 NOTE — Progress Notes (Signed)
[place] at [place] NAME: Geoge Double    MR#:  [id number]  DATE OF BIRTH:  [birth year]  SUBJECTIVE:  CHIEF COMPLAINT:   Chief Complaint  Patient presents with  . Shortness of Breath   - Patient admitted with dyspnea, fevers and chills and also chest pain. -Chest x-ray with right-sided pneumonia, also elevated troponin. -Breathing is improved chest pain is much better now. Remains on heparin drip and also IV antibiotics. -Appreciate cardiology input, suggest to wait until pneumonia resolves. - now planned for cardiac cath tomorrow.  REVIEW OF SYSTEMS:  Review of Systems  Constitutional: Negative for fever and chills.  HENT: Negative for ear discharge, ear pain and nosebleeds.   Respiratory: Negative for cough, shortness of breath and wheezing.   Cardiovascular: Negative for chest pain and palpitations.  Gastrointestinal: Negative for nausea, vomiting, abdominal pain, diarrhea and constipation.  Genitourinary: Negative for dysuria.  Musculoskeletal: Positive for back pain.  Neurological: Negative for dizziness, sensory change, speech change, focal weakness, seizures, weakness and headaches.  Psychiatric/Behavioral: Negative for depression.    DRUG ALLERGIES:  No Known Allergies  VITALS:  Blood pressure 149/78, pulse 77, temperature 98.1 F (36.7 C), temperature source Oral, resp. rate 20, height 5\' 8"  (1.727 m), weight 89.449 kg (197 lb 3.2 oz), SpO2 96 %.  PHYSICAL EXAMINATION:  Physical Exam  GENERAL:  75 y.o.-year-old patient lying in the bed with no acute distress.  EYES: Pupils equal, round, reactive to light and accommodation. No scleral icterus. Extraocular muscles intact.  HEENT: Head atraumatic, normocephalic. Oropharynx and nasopharynx clear.  NECK:  Supple, no jugular venous distention. No thyroid enlargement, no tenderness.  LUNGS: Normal breath sounds bilaterally, no wheezing, rales,rhonchi or crepitation. No  use of accessory muscles of respiration. Decreased bibasilar breath sounds CARDIOVASCULAR: S1, S2 normal. No rubs, or gallops. 3/6 systolic murmur present ABDOMEN: Soft, nontender, nondistended. Bowel sounds present. No organomegaly or mass.  EXTREMITIES: No pedal edema, cyanosis, or clubbing.  NEUROLOGIC: Cranial nerves II through XII are intact. Muscle strength 5/5 in all extremities. Sensation intact. Gait not checked.  PSYCHIATRIC: The patient is alert and oriented x 3.  SKIN: No obvious rash, lesion, or ulcer.    LABORATORY PANEL:   CBC  Recent Labs Lab 11/06/15 0427  WBC 8.2  HGB 12.9*  HCT 37.9*  PLT 119*   ------------------------------------------------------------------------------------------------------------------  Chemistries   Recent Labs Lab 11/05/15 0419  NA 139  K 3.6  CL 109  CO2 25  GLUCOSE 112*  BUN 10  CREATININE 0.91  CALCIUM 8.5*   ------------------------------------------------------------------------------------------------------------------  Cardiac Enzymes  Recent Labs Lab 11/05/15 1024  TROPONINI 9.23*   ------------------------------------------------------------------------------------------------------------------  RADIOLOGY:  No results found.  EKG:   Orders placed or performed during the hospital encounter of 11/03/15  . ED EKG within 10 minutes  . ED EKG within 10 minutes  . EKG 12-Lead  . EKG 12-Lead  . ED EKG  . ED EKG    ASSESSMENT AND PLAN:   75 year old male with past medical history significant for coronary reactive disease status post CABG, degenerative arthritis, hyperlipidemia and GERD presents to the hospital secondary to worsening dyspnea and also chest pain.  #1 community-acquired pneumonia-right middle lobe and also lower lobe pneumonia  associated  with hypoxic respiratory failure on presentation. -Hypoxia has resolved. Currently remains on room air. -Blood cultures are negative.   on Levaquin for  pneumonia. -Breathing is much improved.  #2 NSTEMI-recent echocardiogram 1 week ago was  normal as outpatient. Troponins are significantly elevated and also associated with chest heaviness. -No EKG changes. Appreciate cardiology consult. - troponin stayed significantly high on follow ups. Echo showed no significant change compared to last week. -Continue IV heparin drip for now. Continue all the cardiac medications. - Cardiac cath planned on Wednesday. - If no stents, then may d/c the same day.  #3 coronary artery disease-status post CABG. Currently on heparin drip and management as mentioned above. -Continue all cardiac medications. On aspirin, metoprolol, statin, Imdur.  #4 chronic low back pain-adjust pain medications.  #5 GERD-on Protonix  #6 DVT prophylaxis-on IV heparin    All the records are reviewed and case discussed with Care Management/Social Workerr. Management plans discussed with the patient, family and they are in agreement.  CODE STATUS: Full Code  TOTAL TIME TAKING CARE OF THIS PATIENT: 30 minutes.   POSSIBLE D/C IN 2-3 DAYS, DEPENDING ON CLINICAL CONDITION.   Vaughan Basta M.D on 11/06/2015 at 9:30 PM  Between 7am to 6pm - Pager - [phone]  After 6pm go to [email or web address] - password [email or web address] [facility]  [place] [facility]  Office  [phone]  CC: Primary care physician; Rusty Aus., MD

## 2015-11-06 NOTE — Progress Notes (Signed)
Pharmacy Antibiotic Note- Day 4  Justin [name] is a 75 y.o. male admitted on 11/03/2015 with pneumonia and ACS.  Pharmacy has been consulted for levofloxacin dosing. Patient received levofloxacin 750mg  X1 in ED at 1400  Plan: Will continue levofloxacin 750mg  PO daily for CAP. Recommended duration of therapy 5-7 days.    Height: 5\' 8"  (172.7 cm) Weight: 197 lb 3.2 oz (89.449 kg) IBW/kg (Calculated) : 68.4  Temp (24hrs), Avg:98 F (36.7 C), Min:97.6 F (36.4 C), Max:98.5 F (36.9 C)   Recent Labs Lab 11/03/15 1134 11/04/15 0710 11/05/15 0419 11/06/15 0427  WBC 14.6* 14.3* 9.8 8.2  CREATININE 0.99 0.80 0.91  --     Estimated Creatinine Clearance: 77.4 mL/min (by C-G formula based on Cr of 0.91).    No Known Allergies  Antimicrobials this admission: 02/11>> levofloxacin   Dose adjustments this admission: N/A  Microbiology results: 02/11 BCx x 2: no growth x 3 days 02/11 Sputum: pending   Pharmacy will continue to monitor and adjust per consult.     Currie Paris, PharmD 11/06/2015 4:33 PM

## 2015-11-06 NOTE — Progress Notes (Signed)
Justin [name] at [place] NAME: Justin [name]    MR#:  [id number]  DATE OF BIRTH:  05-03-1941  SUBJECTIVE:  CHIEF COMPLAINT:   Chief Complaint  Patient presents with  . Shortness of Breath   - Patient admitted with dyspnea, fevers and chills and also chest pain. -Chest x-ray with right-sided pneumonia, also elevated troponin. -Breathing is improved chest pain is much better now. Remains on heparin drip and also IV antibiotics. -Appreciate cardiology input, suggest to wait until pneumonia resolves.  REVIEW OF SYSTEMS:  Review of Systems  Constitutional: Negative for fever and chills.  HENT: Negative for ear discharge, ear pain and nosebleeds.   Respiratory: Positive for shortness of breath. Negative for cough and wheezing.   Cardiovascular: Positive for chest pain. Negative for palpitations.  Gastrointestinal: Negative for nausea, vomiting, abdominal pain, diarrhea and constipation.  Genitourinary: Negative for dysuria.  Musculoskeletal: Positive for back pain.  Neurological: Negative for dizziness, sensory change, speech change, focal weakness, seizures, weakness and headaches.  Psychiatric/Behavioral: Negative for depression.    DRUG ALLERGIES:  No Known Allergies  VITALS:  Blood pressure 142/77, pulse 67, temperature 97.8 F (36.6 C), temperature source Oral, resp. rate 16, height 5\' 8"  (1.727 m), weight 89.449 kg (197 lb 3.2 oz), SpO2 94 %.  PHYSICAL EXAMINATION:  Physical Exam  GENERAL:  75 y.o.-year-old patient lying in the bed with no acute distress.  EYES: Pupils equal, round, reactive to light and accommodation. No scleral icterus. Extraocular muscles intact.  HEENT: Head atraumatic, normocephalic. Oropharynx and nasopharynx clear.  NECK:  Supple, no jugular venous distention. No thyroid enlargement, no tenderness.  LUNGS: Normal breath sounds bilaterally, no wheezing, rales,rhonchi or crepitation. No use of accessory  muscles of respiration. Decreased bibasilar breath sounds CARDIOVASCULAR: S1, S2 normal. No rubs, or gallops. 3/6 systolic murmur present ABDOMEN: Soft, nontender, nondistended. Bowel sounds present. No organomegaly or mass.  EXTREMITIES: No pedal edema, cyanosis, or clubbing.  NEUROLOGIC: Cranial nerves II through XII are intact. Muscle strength 5/5 in all extremities. Sensation intact. Gait not checked.  PSYCHIATRIC: The patient is alert and oriented x 3.  SKIN: No obvious rash, lesion, or ulcer.    LABORATORY PANEL:   CBC  Recent Labs Lab 11/06/15 0427  WBC 8.2  HGB 12.9*  HCT 37.9*  PLT 119*   ------------------------------------------------------------------------------------------------------------------  Chemistries   Recent Labs Lab 11/05/15 0419  NA 139  K 3.6  CL 109  CO2 25  GLUCOSE 112*  BUN 10  CREATININE 0.91  CALCIUM 8.5*   ------------------------------------------------------------------------------------------------------------------  Cardiac Enzymes  Recent Labs Lab 11/05/15 1024  TROPONINI 9.23*   ------------------------------------------------------------------------------------------------------------------  RADIOLOGY:  No results found.  EKG:   Orders placed or performed during the hospital encounter of 11/03/15  . ED EKG within 10 minutes  . ED EKG within 10 minutes  . EKG 12-Lead  . EKG 12-Lead  . ED EKG  . ED EKG    ASSESSMENT AND PLAN:   75 year old male with past medical history significant for coronary reactive disease status post CABG, degenerative arthritis, hyperlipidemia and GERD presents to the hospital secondary to worsening dyspnea and also chest pain.  #1 community-acquired pneumonia-right middle lobe and also lower lobe pneumonia noted on the chest x-ray associated  with hypoxic respiratory failure on presentation. -Hypoxia has resolved. Currently remains on room air. -Blood cultures are negative. Started on  Levaquin for pneumonia. -Breathing is much improved.  #2 NSTEMI-recent echocardiogram 1 week ago was normal  as outpatient. Troponins are significantly elevated and also associated with chest heaviness. -No EKG changes. Appreciate cardiology consult. - troponin stayed significantly high on follow ups. Echo showed no significant change compared to last week. -Continue IV heparin drip for now. Continue all the cardiac medications. -Patient might likely need to cardiac catheterization this admission. Cardiology suggesting likely on Wednesday.  #3 coronary artery disease-status post CABG. Currently on heparin drip and management as mentioned above. -Continue all cardiac medications. On aspirin, metoprolol, statin, Imdur.  #4 chronic low back pain-adjust pain medications.  #5 GERD-on Protonix  #6 DVT prophylaxis-on IV heparin    All the records are reviewed and case discussed with Care Management/Social Workerr. Management plans discussed with the patient, family and they are in agreement.  CODE STATUS: Full Code  TOTAL TIME TAKING CARE OF THIS PATIENT: 30 minutes.   POSSIBLE D/C IN 2-3 DAYS, DEPENDING ON CLINICAL CONDITION.   Vaughan Basta M.D on 11/06/2015 at 7:41 AM  Between 7am to 6pm - Pager - [phone]  After 6pm go to [email or web address] - password [email or web address] [facility]  [place] [facility]  Office  [phone]  CC: Primary care physician; Rusty Aus., MD

## 2015-11-06 NOTE — Progress Notes (Signed)
ANTICOAGULATION CONSULT NOTE - follow up [place] for heparin drip Indication: chest pain/ACS  No Known Allergies  Patient Measurements: Height: 5\' 8"  (172.7 cm) Weight: 197 lb 3.2 oz (89.449 kg) IBW/kg (Calculated) : 68.4 Heparin Dosing Weight: 87 kg  Vital Signs: Temp: 97.8 F (36.6 C) (02/14 0556) Temp Source: Oral (02/14 0556) BP: 142/77 mmHg (02/14 0556) Pulse Rate: 67 (02/14 0556)   Recent Labs  11/03/15 1134  11/03/15 1816  11/04/15 0710 11/04/15 0949  11/04/15 2145 11/05/15 0419 11/05/15 1024 11/06/15 0427  HGB 14.1  --   --   --  12.4*  --   --   --  12.5*  --  12.9*  HCT 41.5  --   --   --  36.5*  --   --   --  36.9*  --  37.9*  PLT 117*  --   --   --  95*  --   --   --  92*  --  119*  APTT  --   --  122*  --   --   --   --   --   --   --   --   LABPROT  --   --  15.8*  --   --   --   --   --   --   --   --   INR  --   --  1.24  --   --   --   --   --   --   --   --   HEPARINUNFRC  --   --   --   < > 0.55  --   --   --  0.41  --  0.34  CREATININE 0.99  --   --   --  0.80  --   --   --  0.91  --   --   CKTOTAL  --   --   --   --   --  382  --   --   --   --   --   CKMB  --   --   --   --   --  2.5  --   --   --   --   --   TROPONINI 9.28*  < >  --   < >  --  9.75*  < > 10.27* 9.04* 9.23*  --   < > = values in this interval not displayed.  Estimated Creatinine Clearance: 77.4 mL/min (by C-G formula based on Cr of 0.91).   Medical History: Past Medical History  Diagnosis Date  . Skin cancer   . History of open heart surgery   . CAD (coronary artery disease)   . Arthritis   . DJD (degenerative joint disease)    Assessment 75 yo male seen in ED with SOB, chills and chest pain.  Patient was diagnosis with PNA and found to have elevated troponin I of 9.28. Pharmacy consulted for dosing and monitoring of heparin for ACS.  Goal of Therapy:  Heparin level 0.3-0.7 units/ml Monitor platelets by anticoagulation protocol: Yes   Plan:   Heparin level therapeutic. Continue current rate. Pharmacy will continue to monitor daily.  Zeena Starkel A. [place], [place].D., BCPS Clinical Pharmacist 11/06/2015 6:05 AM

## 2015-11-07 ENCOUNTER — Encounter
Admission: EM | Disposition: A | Discharge status: Home / Self Care | Payer: Self-pay | Source: Home / Self Care | Attending: Internal Medicine

## 2015-11-07 HISTORY — PX: CARDIAC CATHETERIZATION: SHX172

## 2015-11-07 LAB — BASIC METABOLIC PANEL
Anion gap: 9 (ref 5–15)
BUN: 13 mg/dL (ref 6–20)
CALCIUM: 9.1 mg/dL (ref 8.9–10.3)
CO2: 29 mmol/L (ref 22–32)
CREATININE: 0.87 mg/dL (ref 0.61–1.24)
Chloride: 102 mmol/L (ref 101–111)
GFR calc Af Amer: >60 mL/min (ref >60)
GLUCOSE: 119 mg/dL — AB (ref 65–99)
Potassium: 3.9 mmol/L (ref 3.5–5.1)
Sodium: 140 mmol/L (ref 135–145)

## 2015-11-07 LAB — CBC
HCT: 39.8 % — ABNORMAL LOW (ref 40.0–52.0)
Hemoglobin: 13.4 g/dL (ref 13.0–18.0)
MCH: 33.8 pg (ref 26.0–34.0)
MCHC: 33.8 g/dL (ref 32.0–36.0)
MCV: 100.1 fL — ABNORMAL HIGH (ref 80.0–100.0)
PLATELETS: 129 10*3/uL — AB (ref 150–440)
RBC: 3.98 MIL/uL — AB (ref 4.40–5.90)
RDW: 13.2 % (ref 11.5–14.5)
WBC: 7.8 10*3/uL (ref 3.8–10.6)

## 2015-11-07 LAB — HEPARIN LEVEL (UNFRACTIONATED): HEPARIN UNFRACTIONATED: 0.41 [IU]/mL (ref 0.30–0.70)

## 2015-11-07 LAB — PLATELET COUNT: Platelets: 128 10*3/uL — ABNORMAL LOW (ref 150–440)

## 2015-11-07 SURGERY — LEFT HEART CATH AND CORONARY ANGIOGRAPHY
Anesthesia: Moderate Sedation

## 2015-11-07 MED ORDER — TIROFIBAN HCL IN NACL 5-0.9 MG/100ML-% IV SOLN: 0.1500 ug/kg/min | INTRAVENOUS | Status: DC

## 2015-11-07 MED ORDER — HYDROMORPHONE HCL 1 MG/ML IJ SOLN
INTRAMUSCULAR | Status: AC
  Filled 2015-11-07: qty 1

## 2015-11-07 MED ORDER — TIROFIBAN (AGGRASTAT) BOLUS VIA INFUSION
INTRAVENOUS | Status: DC | PRN
  Administered 2015-11-07: 2267.5 ug via INTRAVENOUS

## 2015-11-07 MED ORDER — SODIUM CHLORIDE 0.9 % IV SOLN
INTRAVENOUS | Status: DC
  Administered 2015-11-07: 12:00:00 via INTRAVENOUS

## 2015-11-07 MED ORDER — ASPIRIN 81 MG PO CHEW
CHEWABLE_TABLET | ORAL | Status: DC | PRN
  Administered 2015-11-07: 324 mg via ORAL

## 2015-11-07 MED ORDER — TIROFIBAN HCL IN NACL 5-0.9 MG/100ML-% IV SOLN
INTRAVENOUS | Status: AC
  Filled 2015-11-07: qty 100

## 2015-11-07 MED ORDER — ASPIRIN EC 325 MG PO TBEC
325.0000 mg | DELAYED_RELEASE_TABLET | Freq: Every day | ORAL | Status: DC
  Administered 2015-11-08: 325 mg via ORAL
  Filled 2015-11-07: qty 1

## 2015-11-07 MED ORDER — MAGNESIUM HYDROXIDE 400 MG/5ML PO SUSP
15.0000 mL | Freq: Four times a day (QID) | ORAL | Status: DC
  Administered 2015-11-07 – 2015-11-08 (×2): 15 mL via ORAL
  Filled 2015-11-07 (×10): qty 30

## 2015-11-07 MED ORDER — TIROFIBAN HCL IV 12.5 MG/250 ML
INTRAVENOUS | Status: AC
  Administered 2015-11-07: 12500 ug
  Filled 2015-11-07: qty 250

## 2015-11-07 MED ORDER — SODIUM CHLORIDE 0.9 % WEIGHT BASED INFUSION: 3.0000 mL/kg/h | INTRAVENOUS | Status: DC

## 2015-11-07 MED ORDER — CLOPIDOGREL BISULFATE 75 MG PO TABS
75.0000 mg | ORAL_TABLET | Freq: Every day | ORAL | Status: DC
  Administered 2015-11-08: 75 mg via ORAL
  Filled 2015-11-07: qty 1

## 2015-11-07 MED ORDER — FENTANYL CITRATE (PF) 100 MCG/2ML IJ SOLN
INTRAMUSCULAR | Status: AC
  Filled 2015-11-07: qty 2

## 2015-11-07 MED ORDER — IOHEXOL 300 MG/ML  SOLN
INTRAMUSCULAR | Status: DC | PRN
  Administered 2015-11-07 (×2): 100 mL via INTRA_ARTERIAL

## 2015-11-07 MED ORDER — MIDAZOLAM HCL 2 MG/2ML IJ SOLN
INTRAMUSCULAR | Status: DC | PRN
  Administered 2015-11-07 (×2): 0.5 mg via INTRAVENOUS

## 2015-11-07 MED ORDER — NITROGLYCERIN 5 MG/ML IV SOLN
INTRAVENOUS | Status: AC
Start: 2015-11-07 — End: 2015-11-07
  Filled 2015-11-07: qty 10

## 2015-11-07 MED ORDER — SODIUM CHLORIDE 0.9% FLUSH: 3.0000 mL | INTRAVENOUS | Status: DC | PRN

## 2015-11-07 MED ORDER — ONDANSETRON HCL 4 MG/2ML IJ SOLN: 4.0000 mg | Freq: Four times a day (QID) | INTRAMUSCULAR | Status: DC | PRN

## 2015-11-07 MED ORDER — CLOPIDOGREL BISULFATE 75 MG PO TABS
ORAL_TABLET | ORAL | Status: DC | PRN
  Administered 2015-11-07: 600 mg via ORAL

## 2015-11-07 MED ORDER — ACETAMINOPHEN 325 MG PO TABS: 650.0000 mg | ORAL_TABLET | ORAL | Status: DC | PRN

## 2015-11-07 MED ORDER — BIVALIRUDIN 250 MG IV SOLR
INTRAVENOUS | Status: AC
  Filled 2015-11-07: qty 250

## 2015-11-07 MED ORDER — SODIUM CHLORIDE 0.9 % IV SOLN: 250.0000 mL | INTRAVENOUS | Status: DC | PRN

## 2015-11-07 MED ORDER — NITROGLYCERIN 1 MG/10 ML FOR IR/CATH LAB
INTRA_ARTERIAL | Status: DC | PRN
  Administered 2015-11-07 (×2): 200 ug via INTRACORONARY

## 2015-11-07 MED ORDER — BIVALIRUDIN 250 MG IV SOLR
250.0000 mg | INTRAVENOUS | Status: DC | PRN
  Administered 2015-11-07: 1.75 mg/kg/h via INTRAVENOUS

## 2015-11-07 MED ORDER — BIVALIRUDIN BOLUS VIA INFUSION - CUPID
INTRAVENOUS | Status: DC | PRN
Start: 2015-11-07 — End: 2015-11-07
  Administered 2015-11-07: 68.025 mg via INTRAVENOUS

## 2015-11-07 MED ORDER — SODIUM CHLORIDE 0.9% FLUSH
3.0000 mL | Freq: Two times a day (BID) | INTRAVENOUS | Status: DC
  Administered 2015-11-08: 3 mL via INTRAVENOUS

## 2015-11-07 MED ORDER — HEPARIN (PORCINE) IN NACL 2-0.9 UNIT/ML-% IJ SOLN
INTRAMUSCULAR | Status: AC
  Filled 2015-11-07: qty 1000

## 2015-11-07 MED ORDER — HYDROMORPHONE HCL 1 MG/ML IJ SOLN
1.0000 mg | Freq: Once | INTRAMUSCULAR | Status: AC
Start: 2015-11-07 — End: 2015-11-07
  Administered 2015-11-07: 1 mg via INTRAVENOUS

## 2015-11-07 MED ORDER — MIDAZOLAM HCL 2 MG/2ML IJ SOLN
INTRAMUSCULAR | Status: AC
  Filled 2015-11-07: qty 2

## 2015-11-07 MED ORDER — CLOPIDOGREL BISULFATE 75 MG PO TABS
ORAL_TABLET | ORAL | Status: AC
  Filled 2015-11-07: qty 8

## 2015-11-07 MED ORDER — ASPIRIN 81 MG PO CHEW
CHEWABLE_TABLET | ORAL | Status: AC
  Filled 2015-11-07: qty 4

## 2015-11-07 MED ORDER — FENTANYL CITRATE (PF) 100 MCG/2ML IJ SOLN
INTRAMUSCULAR | Status: DC | PRN
  Administered 2015-11-07 (×2): 25 ug via INTRAVENOUS

## 2015-11-07 MED ORDER — TIROFIBAN HCL IN NACL 5-0.9 MG/100ML-% IV SOLN
INTRAVENOUS | Status: DC | PRN
  Administered 2015-11-07: 0.15 ug/kg/min via INTRAVENOUS

## 2015-11-07 SURGICAL SUPPLY — 21 items
BALLN ~~LOC~~ TREK RX 4.5X12 (BALLOONS) ×3
BALLOON ~~LOC~~ TREK RX 4.5X12 (BALLOONS) ×1 IMPLANT
CATH INFINITI 5 FR IM (CATHETERS) ×3 IMPLANT
CATH INFINITI 5 FR MPA2 (CATHETERS) ×3 IMPLANT
CATH INFINITI 5FR ANG PIGTAIL (CATHETERS) ×3 IMPLANT
CATH INFINITI 5FR JL4 (CATHETERS) ×3 IMPLANT
CATH INFINITI JR4 5F (CATHETERS) ×3 IMPLANT
CATH PRIORITY ONE AC 6F (CATHETERS) ×3 IMPLANT
CATH VISTA GUIDE 6FR MPA1 (CATHETERS) ×3 IMPLANT
DEVICE CLOSURE MYNXGRIP 6/7F (Vascular Products) ×3 IMPLANT
DEVICE INFLAT 30 PLUS (MISCELLANEOUS) ×3 IMPLANT
DEVICE SPIDERFX EMB PROT 4MM (WIRE) ×3 IMPLANT
KIT MANI 3VAL PERCEP (MISCELLANEOUS) ×3 IMPLANT
NEEDLE PERC 18GX7CM (NEEDLE) ×3 IMPLANT
PACK CARDIAC CATH (CUSTOM PROCEDURE TRAY) ×3 IMPLANT
SHEATH PINNACLE 5F 10CM (SHEATH) ×3 IMPLANT
SHEATH PINNACLE 6F 10CM (SHEATH) ×3 IMPLANT
STENT XIENCE ALPINE RX 4.0X28 (Permanent Stent) ×3 IMPLANT
WIRE ASAHI PROWATER 180CM (WIRE) ×3 IMPLANT
WIRE EMERALD 3MM-J .035X150CM (WIRE) ×3 IMPLANT
WIRE EMERALD 3MM-J .035X260CM (WIRE) ×3 IMPLANT

## 2015-11-07 NOTE — Progress Notes (Signed)
ANTICOAGULATION CONSULT NOTE - follow up [place] for heparin drip Indication: chest pain/ACS  No Known Allergies  Patient Measurements: Height: 5\' 8"  (172.7 cm) Weight: 199 lb 8 oz (90.493 kg) IBW/kg (Calculated) : 68.4 Heparin Dosing Weight: 87 kg  Vital Signs: Temp: 98.5 F (36.9 C) (02/15 0620) Temp Source: Oral (02/15 0620) BP: 133/72 mmHg (02/15 0620) Pulse Rate: 67 (02/15 0620)   Recent Labs  11/04/15 0710 11/04/15 0949  11/04/15 2145 11/05/15 0419 11/05/15 1024 11/06/15 0427 11/07/15 0543  HGB 12.4*  --   --   --  12.5*  --  12.9* 13.4  HCT 36.5*  --   --   --  36.9*  --  37.9* 39.8*  PLT 95*  --   --   --  92*  --  119* 129*  HEPARINUNFRC 0.55  --   --   --  0.41  --  0.34 0.41  CREATININE 0.80  --   --   --  0.91  --   --  0.87  CKTOTAL  --  382  --   --   --   --   --   --   CKMB  --  2.5  --   --   --   --   --   --   TROPONINI  --  9.75*  < > 10.27* 9.04* 9.23*  --   --   < > = values in this interval not displayed.  Estimated Creatinine Clearance: 81.3 mL/min (by C-G formula based on Cr of 0.87).   Medical History: Past Medical History  Diagnosis Date  . Skin cancer   . History of open heart surgery   . CAD (coronary artery disease)   . Arthritis   . DJD (degenerative joint disease)    Assessment 75 yo male seen in ED with SOB, chills and chest pain.  Patient was diagnosis with PNA and found to have elevated troponin I of 9.28. Pharmacy consulted for dosing and monitoring of heparin for ACS.  Goal of Therapy:  Heparin level 0.3-0.7 units/ml Monitor platelets by anticoagulation protocol: Yes   Plan:  Heparin level therapeutic. Continue current rate. Pharmacy will continue to monitor daily.  Maryellen Dowdle A. [place], [place].D., BCPS Clinical Pharmacist 11/07/2015 6:30 AM

## 2015-11-07 NOTE — Care Management Important Message (Signed)
Important Message  Patient Details  Name: Justin [name] MRN: [id number] Date of Birth: [birth year]   Medicare Important Message Given:  Yes    Juliann Pulse A Ola Raap 11/07/2015, 9:42 AM

## 2015-11-07 NOTE — Progress Notes (Signed)
Pt clinically stable post pci, on aggrastat gtt for 18hrs per orders, right groin site without bleeding nor hematoma, pt alert and oriented, given dilaudid x1 for severe low back pain since pt on flat/bedrest. sr per monitor,vss,  Dr Josefa Half by to speak with family as well as pt. With questions answered, Dr Ubaldo Glassing out to speak with spouse, with questions answered, pt. On iv ns fluids as well for hydration, Report called to care nurse Snead on 2a, orders and plan reviewed, questions answered. Pt will return to floor at 1600 as he remains clinically stable.

## 2015-11-07 NOTE — Progress Notes (Deleted)
ANTICOAGULATION CONSULT NOTE - follow up [place] for heparin drip Indication: chest pain/ACS  No Known Allergies  Patient Measurements: Height: 5\' 8"  (172.7 cm) Weight: 199 lb 8 oz (90.493 kg) IBW/kg (Calculated) : 68.4 Heparin Dosing Weight: 87 kg  Vital Signs: Temp: 98.1 F (36.7 C) (02/14 1922) Temp Source: Oral (02/14 1922) BP: 149/78 mmHg (02/14 1922) Pulse Rate: 77 (02/14 1922)   Recent Labs  11/04/15 0710 11/04/15 0949  11/04/15 2145 11/05/15 0419 11/05/15 1024 11/06/15 0427  HGB 12.4*  --   --   --  12.5*  --  12.9*  HCT 36.5*  --   --   --  36.9*  --  37.9*  PLT 95*  --   --   --  92*  --  119*  HEPARINUNFRC 0.55  --   --   --  0.41  --  0.34  CREATININE 0.80  --   --   --  0.91  --   --   CKTOTAL  --  382  --   --   --   --   --   CKMB  --  2.5  --   --   --   --   --   TROPONINI  --  9.75*  < > 10.27* 9.04* 9.23*  --   < > = values in this interval not displayed.  Estimated Creatinine Clearance: 77.8 mL/min (by C-G formula based on Cr of 0.91).   Medical History: Past Medical History  Diagnosis Date  . Skin cancer   . History of open heart surgery   . CAD (coronary artery disease)   . Arthritis   . DJD (degenerative joint disease)    Assessment 75 yo male seen in ED with SOB, chills and chest pain.  Patient was diagnosis with PNA and found to have elevated troponin I of 9.28. Pharmacy consulted for dosing and monitoring of heparin for ACS.  Goal of Therapy:  Heparin level 0.3-0.7 units/ml Monitor platelets by anticoagulation protocol: Yes   Plan:  Heparin level therapeutic. Continue current rate. Pharmacy will continue to monitor daily.  Babatunde Seago A. [place], [place].D., BCPS Clinical Pharmacist 11/07/2015 6:11 AM

## 2015-11-07 NOTE — Progress Notes (Signed)
Cardiac cath completed this afternoon. 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 WIll need pci. Follow over night and consider discharge in am if stable.

## 2015-11-07 NOTE — Progress Notes (Signed)
[place] [facility] [place] NAME: Justin [name]    MR#:  [id number]  DATE OF BIRTH:  December 11, 1940  SUBJECTIVE:  CHIEF COMPLAINT:   Chief Complaint  Patient presents with  . Shortness of Breath   - Patient admitted with dyspnea, fevers and chills and also chest pain. -Chest x-ray with right-sided pneumonia, also elevated troponin. -Breathing is improved chest pain is much better now. Remains on heparin drip and also IV antibiotics. For cardiac catheterization today   REVIEW OF SYSTEMS:  Review of Systems  Constitutional: Negative for fever and chills.  HENT: Negative for ear discharge, ear pain and nosebleeds.   Respiratory: Negative for cough, shortness of breath and wheezing.   Cardiovascular: Negative for chest pain and palpitations.  Gastrointestinal: Negative for nausea, vomiting, abdominal pain, diarrhea and constipation.  Genitourinary: Negative for dysuria.  Musculoskeletal: Positive for back pain.  Neurological: Negative for dizziness, sensory change, speech change, focal weakness, seizures, weakness and headaches.  Psychiatric/Behavioral: Negative for depression.    DRUG ALLERGIES:  No Known Allergies  VITALS:  Blood pressure 111/69, pulse 73, temperature 97.7 F (36.5 C), temperature source Oral, resp. rate 18, height 5\' 8"  (1.727 m), weight 90.719 kg (200 lb), SpO2 96 %.  PHYSICAL EXAMINATION:  Physical Exam  GENERAL:  75 y.o.-year-old patient lying in the bed with no acute distress.  EYES: Pupils equal, round, reactive to light and accommodation. No scleral icterus. Extraocular muscles intact.  HEENT: Head atraumatic, normocephalic. Oropharynx and nasopharynx clear.  NECK:  Supple, no jugular venous distention. No thyroid enlargement, no tenderness.  LUNGS: Normal breath sounds bilaterally, no wheezing, rales,rhonchi or crepitation. No use of accessory muscles of respiration. Decreased bibasilar breath  sounds CARDIOVASCULAR: S1, S2 normal. No rubs, or gallops. 3/6 systolic murmur present ABDOMEN: Soft, nontender, nondistended. Bowel sounds present. No organomegaly or mass.  EXTREMITIES: No pedal edema, cyanosis, or clubbing.  NEUROLOGIC: Cranial nerves II through XII are intact. Muscle strength 5/5 in all extremities. Sensation intact. Gait not checked.  PSYCHIATRIC: The patient is alert and oriented x 3.  SKIN: No obvious rash, lesion, or ulcer.    LABORATORY PANEL:   CBC  Recent Labs Lab 11/07/15 0543  WBC 7.8  HGB 13.4  HCT 39.8*  PLT 129*   ------------------------------------------------------------------------------------------------------------------  Chemistries   Recent Labs Lab 11/07/15 0543  NA 140  K 3.9  CL 102  CO2 29  GLUCOSE 119*  BUN 13  CREATININE 0.87  CALCIUM 9.1   ------------------------------------------------------------------------------------------------------------------  Cardiac Enzymes  Recent Labs Lab 11/05/15 1024  TROPONINI 9.23*   ------------------------------------------------------------------------------------------------------------------  RADIOLOGY:  No results found.  EKG:   Orders placed or performed during the hospital encounter of 11/03/15  . ED EKG within 10 minutes  . ED EKG within 10 minutes  . EKG 12-Lead  . EKG 12-Lead  . ED EKG  . ED EKG  . EKG 12-Lead immediately post procedure  . EKG 12-Lead  . EKG 12-Lead immediately post procedure    ASSESSMENT AND PLAN:   76 year old male with past medical history significant for coronary reactive disease status post CABG, degenerative arthritis, hyperlipidemia and GERD presents to the hospital secondary to worsening dyspnea and also chest pain.  #1 community-acquired pneumonia-right middle lobe and also lower lobe pneumonia  associated  with hypoxic respiratory failure on presentation. -Hypoxia has resolved. Currently remains on room air. -Blood cultures  are negative.   on Levaquin for pneumonia. -Breathing is much improved.  #2 NSTEMI-recent  echocardiogram 1 week ago was normal as outpatient. Troponins are significantly elevated and also associated with chest heaviness. -No EKG changes. Appreciate kc  cardiology consult. - troponin stayed significantly high on follow ups. Echo showed no significant change compared to last week. -Received IV heparin drip prior to cardiac catheterization  Continue all the cardiac medications. - Cardiac cath is done today 11/07/2015, status post cardiac stent placement today, cardiology has recommended to discharge patient in a.m. if clinically stable  #3 coronary artery disease-status post CABG. Currently on heparin drip and management as mentioned above. -Continue all cardiac medications. On aspirin, metoprolol, statin, Imdur.  #4 chronic low back pain-adjust pain medications.  #5 GERD-on Protonix  #6 DVT prophylaxis-   #7. Generalized weakness PT consult is placed for evaluation    All the records are reviewed and case discussed with Care Management/Social Workerr. Management plans discussed with the patient, family and they are in agreement.  CODE STATUS: Full Code  TOTAL TIME TAKING CARE OF THIS PATIENT: 35  minutes.   POSSIBLE D/C IN am  DAYS, DEPENDING ON CLINICAL CONDITION.   Nicholes Mango M.D on 11/07/2015 at 3:16 PM  Between 7am to 6pm - Pager - [phone]  After 6pm go to [email or web address] - password [email or web address] [facility]  [facility]  Office  [phone]  CC: Primary care physician; Rusty Aus., MD

## 2015-11-07 NOTE — Care Management (Addendum)
Do not see documentation regarding patient's mobility status.   His 02 sats over the last 24 hours are 93 - 96 on room air and appears these are resting sats even though is not documented specifically at rest.  Do not see sats on room air on exertion.  Spoke with primary nurse regarding need to assess for mobility and need for home 02.  If cardicac cath is negative, anticipate discharge home.  Patient is agreeable to home health and no agency preference.  Recommend SN PT Aide.  Heads up referral to [facility]

## 2015-11-08 ENCOUNTER — Encounter: Payer: Self-pay | Admitting: Cardiology

## 2015-11-08 LAB — BASIC METABOLIC PANEL
ANION GAP: 4 — AB (ref 5–15)
BUN: 11 mg/dL (ref 6–20)
CO2: 30 mmol/L (ref 22–32)
Calcium: 8.5 mg/dL — ABNORMAL LOW (ref 8.9–10.3)
Chloride: 105 mmol/L (ref 101–111)
Creatinine, Ser: 0.83 mg/dL (ref 0.61–1.24)
GFR calc Af Amer: >60 mL/min (ref >60)
GLUCOSE: 117 mg/dL — AB (ref 65–99)
POTASSIUM: 3.7 mmol/L (ref 3.5–5.1)
SODIUM: 139 mmol/L (ref 135–145)

## 2015-11-08 LAB — CBC
HCT: 38.1 % — ABNORMAL LOW (ref 40.0–52.0)
HEMOGLOBIN: 12.9 g/dL — AB (ref 13.0–18.0)
MCH: 33.7 pg (ref 26.0–34.0)
MCHC: 33.7 g/dL (ref 32.0–36.0)
MCV: 100.1 fL — AB (ref 80.0–100.0)
PLATELETS: 141 10*3/uL — AB (ref 150–440)
RBC: 3.81 MIL/uL — AB (ref 4.40–5.90)
RDW: 13.3 % (ref 11.5–14.5)
WBC: 7.3 10*3/uL (ref 3.8–10.6)

## 2015-11-08 LAB — HEPARIN LEVEL (UNFRACTIONATED): Heparin Unfractionated: <0.1 IU/mL — ABNORMAL LOW (ref 0.30–0.70)

## 2015-11-08 LAB — CULTURE, BLOOD (ROUTINE X 2)
CULTURE: NO GROWTH
CULTURE: NO GROWTH

## 2015-11-08 MED ORDER — CLOPIDOGREL BISULFATE 75 MG PO TABS: 75.0000 mg | ORAL_TABLET | Freq: Every day | ORAL | Status: DC

## 2015-11-08 MED ORDER — BISACODYL 10 MG RE SUPP: 10.0000 mg | Freq: Every day | RECTAL | Status: DC

## 2015-11-08 MED ORDER — ASPIRIN 325 MG PO TBEC: 325.0000 mg | DELAYED_RELEASE_TABLET | Freq: Every day | ORAL | Status: DC

## 2015-11-08 MED ORDER — ASPIRIN 325 MG PO TABS: 325.0000 mg | ORAL_TABLET | Freq: Every day | ORAL | Status: DC

## 2015-11-08 MED ORDER — LEVOFLOXACIN 750 MG PO TABS: 750.0000 mg | ORAL_TABLET | Freq: Every day | ORAL | Status: DC

## 2015-11-08 NOTE — Progress Notes (Addendum)
SATURATION QUALIFICATIONS: (This note is used to comply with regulatory documentation for home oxygen) ° °Patient Saturations on Room Air at Rest = 96% ° °Patient Saturations on Room Air while Ambulating = 89% ° ° °

## 2015-11-08 NOTE — Discharge Instructions (Signed)
Activity as tolerated  diet cardiac neck strain  follow-up with primary care physician in a week Follow-up with cardiology Dr. Ubaldo Glassing  in a week You will have no home health follow up since you are being enrolled in cardiac rehabilitation.

## 2015-11-08 NOTE — Care Management Note (Signed)
Case Management Note  Patient Details  Name: WYLER SOUTHWOOD MRN: [id number] Date of Birth: January 06, 1941  Subjective/Objective:  PT recommending no PT follow up. He did not qualify for home O2 and is doing well on room air. Patient to follow up with cardiac rehab. No home health follow up needed. Updated patient who is agreeeable and notified Edwina with [facility].                  Action/Plan: Home with self care and cardiac rehab follow up  Expected Discharge Date:   11/08/2015     Expected Discharge Plan:  Home/Self Care / In-House Referral:     Discharge planning Services  CM Consult  Post Acute Care Choice:    Choice offered to:     DME Arranged:    DME Agency:     HH Arranged:    [place]:     Status of Service:  Completed, signed off  Medicare Important Message Given:  Yes Date Medicare IM Given:    Medicare IM give by:    Date Additional Medicare IM Given:    Additional Medicare Important Message give by:     If discussed at [place] of Stay Meetings, dates discussed:    Additional Comments:  Jolly Mango, RN 11/08/2015, 12:06 PM

## 2015-11-08 NOTE — Evaluation (Signed)
Physical Therapy Evaluation Patient Details Name: Justin [name] MRN: [id number] DOB: 1941/06/27 Today's Date: 11/08/2015   History of Present Illness  presented to ER secondary to acute onset of SOB; admitted with R ML/LL PNA, NSTEMI (troponin peak 10.27), status post cardiac catheterization (2/15) with PCI to SVG.  Clinical Impression  Upon evaluation, patient alert and oriented; follows commands and demonstrates good insight/safety awareness.  Bilat UE/LE strength and ROM grossly WFL and symmetrical; no pain reported.  Able to complete bed mobility indep; sit/stand, basic transfers and gait (250') without assist device, distant sup/mod indep.  Fair/good cadence and overall gait mechanics without LOB or safety concern; completes 10' walk test in 6 seconds.  Patient reports performance is baseline for him; comfortable with upcoming discharge. Vitals stable and WFL throughout session; denies chest pain or palpitations.  R groin site clean, dry and intact before/after session. No acute PT needs identified at this time.  Will complete initial order.  Please re-consult should needs change.     Follow Up Recommendations No PT follow up    Equipment Recommendations       Recommendations for Other Services       Precautions / Restrictions Precautions Precautions: Fall Restrictions Weight Bearing Restrictions: No      Mobility  Bed Mobility Overal bed mobility: Independent                Transfers Overall transfer level: Modified independent Equipment used: None                Ambulation/Gait Ambulation/Gait assistance: Supervision;Modified independent (Device/Increase time) Ambulation Distance (Feet): 250 Feet Assistive device: None   Gait velocity: 10' walk time, 6 seconds   General Gait Details: reciprocal stepping with fair cadence, gait speed; good mechanics without overt LOB or safety concern  Stairs            Wheelchair Mobility    Modified Rankin  (Stroke Patients Only)       Balance Overall balance assessment: Needs assistance Sitting-balance support: No upper extremity supported;Feet supported Sitting balance-Leahy Scale: Good     Standing balance support: No upper extremity supported Standing balance-Leahy Scale: Fair                               Pertinent Vitals/Pain Pain Assessment: No/denies pain    Home Living Family/patient expects to be discharged to:: Private residence Living Arrangements: Spouse/significant other Available Help at Discharge: Family Type of Home: House Home Access: Stairs to enter Entrance Stairs-Rails: None (can hold wall for stability as needed)   Home Layout: Two level;Able to live on main level with bedroom/bathroom        Prior Function Level of Independence: Independent         Comments: Indep with all household, community mobility; + driving; denies fall history.     Hand Dominance        Extremity/Trunk Assessment   Upper Extremity Assessment: Overall WFL for tasks assessed           Lower Extremity Assessment: Overall WFL for tasks assessed         Communication      Cognition Arousal/Alertness: Awake/alert Behavior During Therapy: WFL for tasks assessed/performed Overall Cognitive Status: Within Functional Limits for tasks assessed                      General Comments      Exercises  Assessment/Plan    PT Assessment Patent does not need any further PT services  PT Diagnosis     PT Problem List    PT Treatment Interventions     PT Goals (Current goals can be found in the Care Plan section) Acute Rehab PT Goals Patient Stated Goal: to go home this afternoon PT Goal Formulation: All assessment and education complete, DC therapy    Frequency     Barriers to discharge        Co-evaluation               End of Session Equipment Utilized During Treatment: Gait belt Activity Tolerance: Patient tolerated  treatment well Patient left: in chair;with call bell/phone within reach;with chair alarm set Nurse Communication: Mobility status         Time: [id number] PT Time Calculation (min) (ACUTE ONLY): 15 min   Charges:   PT Evaluation $PT Eval Low Complexity: 1 Procedure     PT G Codes:        Joan Avetisyan H. Owens Shark, PT, DPT, NCS 11/08/2015, 1:38 PM [phone]

## 2015-11-08 NOTE — Progress Notes (Signed)
[employer]  SUBJECTIVE: Doing well this am. No chest pain. No discomfort in cath site with no bleeding. Had a bowel movement today. Ambulating without difficulty.    Filed Vitals:   11/07/15 1659 11/07/15 1931 11/08/15 0528 11/08/15 0846  BP: 113/63 125/84 124/70 134/79  Pulse: 76 94 72 78  Temp: 98.4 F (36.9 C) 98.4 F (36.9 C) 97.5 F (36.4 C)   TempSrc: Oral Oral Oral   Resp: 18 19 20    Height:      Weight:   90.765 kg (200 lb 1.6 oz)   SpO2: 91% 94% 97% 94%    Intake/Output Summary (Last 24 hours) at 11/08/15 1105 Last data filed at 11/08/15 0736  Gross per 24 hour  Intake 1119.14 ml  Output   1450 ml  Net -330.86 ml    LABS: Basic Metabolic Panel:  Recent Labs  11/07/15 0543 11/08/15 0513  NA 140 139  K 3.9 3.7  CL 102 105  CO2 29 30  GLUCOSE 119* 117*  BUN 13 11  CREATININE 0.87 0.83  CALCIUM 9.1 8.5*   Liver Function Tests: No results for input(s): AST, ALT, ALKPHOS, BILITOT, PROT, ALBUMIN in the last 72 hours. No results for input(s): LIPASE, AMYLASE in the last 72 hours. CBC:  Recent Labs  11/07/15 0543 11/07/15 2042 11/08/15 0513  WBC 7.8  --  7.3  HGB 13.4  --  12.9*  HCT 39.8*  --  38.1*  MCV 100.1*  --  100.1*  PLT 129* 128* 141*   Cardiac Enzymes: No results for input(s): CKTOTAL, CKMB, CKMBINDEX, TROPONINI in the last 72 hours. BNP: Invalid input(s): POCBNP D-Dimer: No results for input(s): DDIMER in the last 72 hours. Hemoglobin A1C: No results for input(s): HGBA1C in the last 72 hours. Fasting Lipid Panel: No results for input(s): CHOL, HDL, LDLCALC, TRIG, CHOLHDL, LDLDIRECT in the last 72 hours. Thyroid Function Tests: No results for input(s): TSH, T4TOTAL, T3FREE, THYROIDAB in the last 72 hours.  Invalid input(s): FREET3 Anemia Panel: No results for input(s): VITAMINB12, FOLATE, FERRITIN, TIBC, IRON, RETICCTPCT in the last 72 hours.   Physical Exam: Blood pressure 134/79, pulse 78,  temperature 97.5 F (36.4 C), temperature source Oral, resp. rate 20, height 5\' 8"  (1.727 m), weight 90.765 kg (200 lb 1.6 oz), SpO2 94 %.    General appearance: alert and cooperative Neck: no adenopathy, no carotid bruit, no JVD, supple, symmetrical, trachea midline and thyroid not enlarged, symmetric, no tenderness/mass/nodules Resp: clear to auscultation bilaterally Cardio: regular rate and rhythm GI: soft, non-tender; bowel sounds normal; no masses,  no organomegaly Extremities: extremities normal, atraumatic, no cyanosis or edema Pulses: 2+ and symmetric Neurologic: Grossly normal  TELEMETRY: Reviewed telemetry pt in nsr:  ASSESSMENT AND PLAN:  Active Problems:   Pneumonia-improved clinically remains on abx.    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 Patgent lima to lad and 90% stenosis in svg to pl.Had pci of lesion after clot aaspiration. No stable on asa and plavix. OK for discharge on asa 325 and plavix 75 mg. Follow up in 1 week.     Teodoro Spray., MD, [facility] 11/08/2015 11:05 AM

## 2015-11-08 NOTE — Discharge Summary (Signed)
[facility] [place] NAME: Justin [name]    MR#:  [id number]  DATE OF BIRTH:  Feb 11, 1941  DATE OF ADMISSION:  11/03/2015 ADMITTING PHYSICIAN: Hillary Bow, MD  DATE OF DISCHARGE: 11/07/2068 PRIMARY CARE PHYSICIAN: Rusty Aus., MD    ADMISSION DIAGNOSIS:  Hypoxia [R09.02] CAP (community acquired pneumonia) [J18.9] Chest pain, unspecified chest pain type [R07.9]  DISCHARGE DIAGNOSIS:  Active Problems:   Pneumonia   NSTEMI (non-ST elevated myocardial infarction) ([place])   SECONDARY DIAGNOSIS:   Past Medical History  Diagnosis Date  . Skin cancer   . History of open heart surgery   . CAD (coronary artery disease)   . Arthritis   . DJD (degenerative joint disease)     HOSPITAL COURSE:  75 year old male with past medical history significant for coronary reactive disease status post CABG, degenerative arthritis, hyperlipidemia and GERD presents to the hospital secondary to worsening dyspnea and also chest pain.  #1 community-acquired pneumonia-right middle lobe and also lower lobe pneumonia associated with hypoxic respiratory failure on presentation. -Hypoxia has resolved. Currently remains on room air. -Blood cultures are negative.   Levaquin was given for pneumonia and discharged home with by mouth levofloxacin -Breathing is much improved.  #2 NSTEMI-recent echocardiogram 1 week ago was normal as outpatient. Troponins are significantly elevated and also associated with chest heaviness. -No EKG changes. Appreciate [facility] cardiology consult. - troponin stayed significantly high on follow ups. Echo showed no significant change compared to last week. -Received IV heparin drip prior to cardiac catheterization - Cardiac cath is done 11/07/2015, status post cardiac stent placement  -cardiac cath revealed evidence of 3 vessel native disease which was chronic with occluded svg to om. Patgent lima to lad and 90% stenosis in svg to  pl.Had pci of lesion after clot aaspiration. -cardiology has recommended to discharge patient with aspirin 325 mg by mouth once daily and Plavix 75 mg once daily and follow-up with Dr. Ubaldo Glassing in a week as he is clinically stable  #3 coronary artery disease-status post CABG. -Continue all cardiac medications. On aspirin, metoprolol, statin, Imdur.Adding Plavix to the regimen  #4 chronic low back pain- pain medications when necessary  #5 GERD-on Protonix  #6 DVT prophylaxis-   #7. Generalized weakness PT consult is recommending no need of PT    DISCHARGE CONDITIONS:   fair  CONSULTS OBTAINED:  Treatment Team:  Teodoro Spray, MD Nicholes Mango, MD   PROCEDURES cardiac catheterization and stent placement on 11/07/2015  DRUG ALLERGIES:  No Known Allergies  DISCHARGE MEDICATIONS:   Current Discharge Medication List    START taking these medications   Details  aspirin EC 325 MG EC tablet Take 1 tablet (325 mg total) by mouth daily. Qty: 30 tablet, Refills: 0    clopidogrel (PLAVIX) 75 MG tablet Take 1 tablet (75 mg total) by mouth daily. Qty: 30 tablet, Refills: 0    levofloxacin (LEVAQUIN) 750 MG tablet Take 1 tablet (750 mg total) by mouth daily. Qty: 4 tablet, Refills: 0      CONTINUE these medications which have NOT CHANGED   Details  acetaminophen (TYLENOL) 325 MG tablet Take 650 mg by mouth 3 (three) times daily.     atorvastatin (LIPITOR) 80 MG tablet Take 40 mg by mouth at bedtime.     Calcium Carb-Cholecalciferol (CALCIUM 600 + D PO) Take 1 tablet by mouth 2 (two) times daily. Take morning and at noon.    cholecalciferol (VITAMIN D) 1000 units  tablet Take 1,000 Units by mouth 3 (three) times daily.    fexofenadine (ALLEGRA) 180 MG tablet Take 180 mg by mouth daily before breakfast.     fluticasone (FLONASE) 50 MCG/ACT nasal spray Place 2 sprays into both nostrils at bedtime.    isosorbide mononitrate (IMDUR) 60 MG 24 hr tablet Take 60 mg by mouth daily.     LACTOBACILLUS PO Take 2 tablets by mouth every morning.     Magnesium 400 MG CAPS Take 400 mg by mouth 2 (two) times daily.     metoprolol (LOPRESSOR) 50 MG tablet Take 50 mg by mouth 2 (two) times daily.    niacin 500 MG tablet Take 500 mg by mouth 3 (three) times daily. Take one tablet in the morning, take one tablet in the afternoon and take one tablet at night    nitroGLYCERIN (NITROSTAT) 0.4 MG SL tablet Place 0.4 mg under the tongue every 5 (five) minutes x 3 doses as needed for chest pain. If no relief call MD or go to emergency room.    Omega-3 Fatty Acids (FISH OIL) 1000 MG CAPS Take 1,000 mg by mouth 2 (two) times daily.    omeprazole (PRILOSEC) 20 MG capsule Take 20 mg by mouth 2 (two) times daily before a meal.    senna-docusate (SENOKOT-S) 8.6-50 MG per tablet Take 2 tablets by mouth 2 (two) times daily. 4 TIMES DAILY    traMADol (ULTRAM) 50 MG tablet Take 50 mg by mouth 3 (three) times daily.     vitamin C (ASCORBIC ACID) 500 MG tablet Take 500 mg by mouth daily.    Zoledronic Acid (RECLAST IV) Inject into the vein. Injection once yearly      STOP taking these medications     aspirin 81 MG tablet          DISCHARGE INSTRUCTIONS:   Activity as tolerated  diet cardiac neck strain  follow-up with primary care physician in a week Follow-up with cardiology Dr. Ubaldo Glassing  in a week   DIET:  Cardiac diet  DISCHARGE CONDITION:  Fair  ACTIVITY:  Activity as tolerated  OXYGEN:  Home Oxygen: No.   Oxygen Delivery: room air  DISCHARGE LOCATION:  home   If you experience worsening of your admission symptoms, develop shortness of breath, life threatening emergency, suicidal or homicidal thoughts you must seek medical attention immediately by calling 911 or calling your MD immediately  if symptoms less severe.  You Must read complete instructions/literature along with all the possible adverse reactions/side effects for all the Medicines you take and that have  been prescribed to you. Take any new Medicines after you have completely understood and accpet all the possible adverse reactions/side effects.   Please note  You were cared for by a hospitalist during your hospital stay. If you have any questions about your discharge medications or the care you received while you were in the hospital after you are discharged, you can call the unit and asked to speak with the hospitalist on call if the hospitalist that took care of you is not available. Once you are discharged, your primary care physician will handle any further medical issues. Please note that NO REFILLS for any discharge medications will be authorized once you are discharged, as it is imperative that you return to your primary care physician (or establish a relationship with a primary care physician if you do not have one) for your aftercare needs so that they can reassess your need for medications and  monitor your lab values.     Today  Chief Complaint  Patient presents with  . Shortness of Breath   Today patient is resting comfortably. Denies any chest pain or shortness of breath. I'm related with physical therapy and no PT needs identified. Had 2 bowel movements today.  ROS:  CONSTITUTIONAL: Denies fevers, chills. Denies any fatigue, weakness.  EYES: Denies blurry vision, double vision, eye pain. EARS, NOSE, THROAT: Denies tinnitus, ear pain, hearing loss. RESPIRATORY: Denies cough, wheeze, shortness of breath.  CARDIOVASCULAR: Denies chest pain, palpitations, edema.  GASTROINTESTINAL: Denies nausea, vomiting, diarrhea, abdominal pain. Denies bright red blood per rectum. GENITOURINARY: Denies dysuria, hematuria. ENDOCRINE: Denies nocturia or thyroid problems. HEMATOLOGIC AND LYMPHATIC: Denies easy bruising or bleeding. SKIN: Denies rash or lesion. MUSCULOSKELETAL: minimal pain in the groin area at cardiac catheterization site.  Denies pain in neck, back, shoulder, knees, hips or  arthritic symptoms.  NEUROLOGIC: Denies paralysis, paresthesias.  PSYCHIATRIC: Denies anxiety or depressive symptoms.   VITAL SIGNS:  Blood pressure 121/71, pulse 81, temperature 99.1 F (37.3 C), temperature source Oral, resp. rate 18, height 5\' 8"  (1.727 m), weight 90.765 kg (200 lb 1.6 oz), SpO2 95 %.  I/O:    Intake/Output Summary (Last 24 hours) at 11/08/15 1205 Last data filed at 11/08/15 0736  Gross per 24 hour  Intake 1119.14 ml  Output   1450 ml  Net -330.86 ml    PHYSICAL EXAMINATION:  GENERAL:  75 y.o.-year-old patient lying in the bed with no acute distress.  EYES: Pupils equal, round, reactive to light and accommodation. No scleral icterus. Extraocular muscles intact.  HEENT: Head atraumatic, normocephalic. Oropharynx and nasopharynx clear.  NECK:  Supple, no jugular venous distention. No thyroid enlargement, no tenderness.  LUNGS: Normal breath sounds bilaterally, no wheezing, rales,rhonchi or crepitation. No use of accessory muscles of respiration.  CARDIOVASCULAR: S1, S2 normal. No murmurs, rubs, or gallops.  ABDOMEN: Soft, non-tender, non-distended. Bowel sounds present. No organomegaly or mass.  EXTREMITIES:  right groin area with no bleeding at catheterization site.No pedal edema, cyanosis, or clubbing.  NEUROLOGIC: Cranial nerves II through XII are intact. Muscle strength 5/5 in all extremities. Sensation intact. Gait not checked.  PSYCHIATRIC: The patient is alert and oriented x 3.  SKIN: No obvious rash, lesion, or ulcer.   DATA REVIEW:   CBC  Recent Labs Lab 11/08/15 0513  WBC 7.3  HGB 12.9*  HCT 38.1*  PLT 141*    Chemistries   Recent Labs Lab 11/08/15 0513  NA 139  K 3.7  CL 105  CO2 30  GLUCOSE 117*  BUN 11  CREATININE 0.83  CALCIUM 8.5*    Cardiac Enzymes  Recent Labs Lab 11/05/15 1024  TROPONINI 9.23*    Microbiology Results  Results for orders placed or performed during the hospital encounter of 11/03/15  Blood  culture (routine x 2)     Status: None   Collection Time: 11/03/15  2:00 PM  Result Value Ref Range Status   Specimen Description BLOOD LEFT HAND OUTER  Final   Special Requests BOTTLES DRAWN AEROBIC AND ANAEROBIC  1CC  Final   Culture NO GROWTH 5 DAYS  Final   Report Status 11/08/2015 FINAL  Final  Blood culture (routine x 2)     Status: None   Collection Time: 11/03/15  2:14 PM  Result Value Ref Range Status   Specimen Description BLOOD LEFT HAND MIDDLE  Final   Special Requests BOTTLES DRAWN AEROBIC AND ANAEROBIC  1CC  Final   Culture NO GROWTH 5 DAYS  Final   Report Status 11/08/2015 FINAL  Final    RADIOLOGY:  No results found.  EKG:   Orders placed or performed during the hospital encounter of 11/03/15  . ED EKG within 10 minutes  . ED EKG within 10 minutes  . EKG 12-Lead  . EKG 12-Lead  . ED EKG  . ED EKG  . EKG 12-Lead immediately post procedure  . EKG 12-Lead  . EKG 12-Lead immediately post procedure  . EKG 12-Lead  . EKG 12-Lead  . EKG 12-Lead      Management plans discussed with the patient, family and they are in agreement.  CODE STATUS:     Code Status Orders        Start     Ordered   11/03/15 1341  Full code   Continuous     11/03/15 1341    Code Status History    Date Active Date Inactive Code Status Order ID Comments User Context   This patient has a current code status but no historical code status.      TOTAL TIME TAKING CARE OF THIS PATIENT: 45  minutes.    @[facility] @  on 11/08/2015 at 12:05 PM  Between 7am to 6pm - Pager - [phone]  After 6pm go to [email or web address] - password [email or web address] [facility]  [facility]  Office  [phone]  CC: Primary care physician; Rusty Aus., MD

## 2015-11-08 NOTE — Progress Notes (Signed)
Patient received discharge instructions, pt verbalized understanding. IV was removed with no signs of infection. Dressing clean, dry intact. No skin tears or wounds present. Prescription was printed and given to patient. Patient was escorted out with staff member via wheelchair via private auto. No further needs from care management team. Per Cyril Mourning from physical therapy, PT not recommended.

## 2015-11-19 ENCOUNTER — Encounter: Payer: Medicare Other | Attending: Cardiology | Admitting: *Deleted

## 2015-11-19 VITALS — Ht 69.0 in | Wt 202.6 lb

## 2015-11-19 DIAGNOSIS — I214 Non-ST elevation (NSTEMI) myocardial infarction: Secondary | ICD-10-CM | POA: Insufficient documentation

## 2015-11-19 DIAGNOSIS — Z9861 Coronary angioplasty status: Secondary | ICD-10-CM | POA: Diagnosis present

## 2015-11-19 NOTE — Patient Instructions (Signed)
Patient Instructions  Patient Details  Name: Justin [name] MRN: [id number] Date of Birth: [birth year] Referring Provider:  Isaias Cowman, MD  Below are the personal goals you chose as well as exercise and nutrition goals. Our goal is to help you keep on track towards obtaining and maintaining your goals. We will be discussing your progress on these goals with you throughout the program.  Initial Exercise Prescription:     Initial Exercise Prescription - 11/19/15 1400    Date of Initial Exercise Prescription   Date 11/19/15   Treadmill   MPH 2   Grade 0   Minutes 15   Bike   Level 1   Minutes 15   Recumbant Bike   Level 2   Watts 30   NuStep   Level 3   Watts 40   Minutes 15   Arm Ergometer   Level 1   Watts 8   Minutes 15   Arm/Foot Ergometer   Level 1   Watts 10   Minutes 15   Cybex   Level 2   RPM 40   Minutes 15   Recumbant Elliptical   Level 2   Watts 20   Minutes 15   Elliptical   Level 1   Speed 3   Minutes 5   REL-XR   Level 3   Watts 40   Minutes 15   T5 Nustep   Level 2   Watts 20   Minutes 15   Biostep-RELP   Level 3   Watts 40   Minutes 15   Prescription Details   Frequency (times per week) 3   Duration Progress to 30 minutes of continuous aerobic without signs/symptoms of physical distress   Intensity   THRR REST +  30   Ratings of Perceived Exertion 11-15   Perceived Dyspnea 2-4   Progression Continue progressive overload as per policy without signs/symptoms or physical distress.   Resistance Training   Training Prescription Yes   Weight 2   Reps 10-15      Exercise Goals: Frequency: Be able to perform aerobic exercise three times per week working toward 3-5 days per week.  Intensity: Work with a perceived exertion of 11 (fairly light) - 15 (hard) as tolerated. Follow your new exercise prescription and watch for changes in prescription as you progress with the program. Changes will be reviewed with you when they are  made.  Duration: You should be able to do 30 minutes of continuous aerobic exercise in addition to a 5 minute warm-up and a 5 minute cool-down routine.  Nutrition Goals: Your personal nutrition goals will be established when you do your nutrition analysis with the dietician.  The following are nutrition guidelines to follow: Cholesterol < 200mg /day Sodium < 1500mg /day Fiber: Men over 50 yrs - 30 grams per day  Personal Goals:     Personal Goals and Risk Factors at Admission - 11/19/15 1313    Core Components/Risk Factors/Patient Goals on Admission    Weight Management Yes   Intervention Weight Management: Develop a combined nutrition and exercise program designed to reach desired caloric intake, while maintaining appropriate intake of nutrient and fiber, sodium and fats, and appropriate energy expenditure required for the weight goal.;Weight Management: Provide education and appropriate resources to help participant work on and attain dietary goals.;Weight Management/Obesity: Establish reasonable short term and long term weight goals.;Obesity: Provide education and appropriate resources to help participant work on and attain dietary goals.   Admit Weight 202  lb (91.627 kg)   Goal Weight: Short Term 190 lb (86.183 kg)   Goal Weight: Long Term 180 lb (81.647 kg)   Expected Outcomes Short Term: Continue to assess and modify interventions until short term weight is achieved.;Long Term: Adherence to nutrition and physical activity/exercise program aimed toward attainment of established weight goal.   Sedentary Yes   Intervention Provide advice, education, support and counseling about physical activity/exercise needs.;Develop an individualized exercise prescription for aerobic and resistive training based on initial evaluation findings, risk stratification, comorbidities and participant's personal goals.   Expected Outcomes Achievement of increased cardiorespiratory fitness and enhanced  flexibility, muscular endurance and strength shown through measurements of functional capaciy and personal statement of participant.   Lipids Yes  Currently taking niacin for low HDL. Reports his levels have increased to over 50 from in the 20's.   Intervention Provide education and support for participant on nutrition & aerobic/resistive exercise along with prescribed medications to achieve LDL 70mg , HDL >40mg .   Expected Outcomes Short Term: Participant states understanding of desired cholesterol values and is compliant with medications prescribed. Participant is following exercise prescription and nutrition guidelines.;Long Term: Cholesterol controlled with medications as prescribed, with individualized exercise RX and with personalized nutrition plan. Value goals: LDL < 70mg , HDL > 40 mg.      Tobacco Use Initial Evaluation: History  Smoking status  . Former Smoker  . Quit date: 06/26/1990  Smokeless tobacco  . Never Used    Copy of goals given to participant.

## 2015-11-19 NOTE — Progress Notes (Cosign Needed)
Cardiac Individual Treatment Plan  Patient Details  Name: Justin [name] MRN: [id number] Date of Birth: 1941/06/20 Referring Provider:  Isaias Cowman, MD  Initial Encounter Date: Date: 11/19/15  Visit Diagnosis: NSTEMI (non-ST elevated myocardial infarction) ([place])  S/P PTCA (percutaneous transluminal coronary angioplasty)  Patient's Home Medications on Admission:  Current outpatient prescriptions:  .  acetaminophen (TYLENOL) 325 MG tablet, Take 650 mg by mouth 3 (three) times daily. , Disp: , Rfl:  .  aspirin EC 325 MG EC tablet, Take 1 tablet (325 mg total) by mouth daily., Disp: 30 tablet, Rfl: 0 .  atorvastatin (LIPITOR) 80 MG tablet, Take 40 mg by mouth at bedtime. , Disp: , Rfl:  .  Calcium Carb-Cholecalciferol (CALCIUM 600 + D PO), Take 1 tablet by mouth 2 (two) times daily. Take morning and at noon., Disp: , Rfl:  .  cholecalciferol (VITAMIN D) 1000 units tablet, Take 1,000 Units by mouth 3 (three) times daily., Disp: , Rfl:  .  clopidogrel (PLAVIX) 75 MG tablet, Take 1 tablet (75 mg total) by mouth daily., Disp: 30 tablet, Rfl: 0 .  fexofenadine (ALLEGRA) 180 MG tablet, Take 180 mg by mouth daily before breakfast. , Disp: , Rfl:  .  fluticasone (FLONASE) 50 MCG/ACT nasal spray, Place 2 sprays into both nostrils at bedtime., Disp: , Rfl:  .  isosorbide mononitrate (IMDUR) 60 MG 24 hr tablet, Take 60 mg by mouth daily., Disp: , Rfl:  .  LACTOBACILLUS PO, Take 2 tablets by mouth every morning. , Disp: , Rfl:  .  Magnesium 400 MG CAPS, Take 400 mg by mouth 2 (two) times daily. , Disp: , Rfl:  .  metoprolol (LOPRESSOR) 50 MG tablet, Take 50 mg by mouth 2 (two) times daily., Disp: , Rfl:  .  niacin 500 MG tablet, Take 500 mg by mouth 3 (three) times daily. Take one tablet in the morning, take one tablet in the afternoon and take one tablet at night, Disp: , Rfl:  .  nitroGLYCERIN (NITROSTAT) 0.4 MG SL tablet, Place 0.4 mg under the tongue every 5 (five) minutes x 3 doses as needed  for chest pain. If no relief call MD or go to emergency room., Disp: , Rfl:  .  Omega-3 Fatty Acids (FISH OIL) 1000 MG CAPS, Take 1,000 mg by mouth 2 (two) times daily., Disp: , Rfl:  .  omeprazole (PRILOSEC) 20 MG capsule, Take 20 mg by mouth 2 (two) times daily before a meal., Disp: , Rfl:  .  senna-docusate (SENOKOT-S) 8.6-50 MG per tablet, Take 2 tablets by mouth 2 (two) times daily. 4 TIMES DAILY, Disp: , Rfl:  .  traMADol (ULTRAM) 50 MG tablet, Take 50 mg by mouth 3 (three) times daily. , Disp: , Rfl:  .  vitamin C (ASCORBIC ACID) 500 MG tablet, Take 500 mg by mouth daily., Disp: , Rfl:  .  Zoledronic Acid (RECLAST IV), Inject into the vein. Injection once yearly, Disp: , Rfl:  .  levofloxacin (LEVAQUIN) 750 MG tablet, Take 1 tablet (750 mg total) by mouth daily. (Patient not taking: Reported on 11/19/2015), Disp: 4 tablet, Rfl: 0  Past Medical History: Past Medical History  Diagnosis Date  . Skin cancer   . History of open heart surgery   . CAD (coronary artery disease)   . Arthritis   . DJD (degenerative joint disease)     Tobacco Use: History  Smoking status  . Former Smoker  . Quit date: 06/26/1990  Smokeless tobacco  .  Never Used    Labs: Recent Review Flowsheet Data    Labs for ITP Cardiac and Pulmonary Rehab Latest Ref Rng 11/04/2015   Cholestrol 0 - 200 mg/dL 98   LDLCALC 0 - 99 mg/dL 29   HDL >40 mg/dL 56   Trlycerides <150 mg/dL 64       Exercise Target Goals: Date: 11/19/15  Exercise Program Goal: Individual exercise prescription set with THRR, safety & activity barriers. Participant demonstrates ability to understand and report RPE using BORG scale, to self-measure pulse accurately, and to acknowledge the importance of the exercise prescription.  Exercise Prescription Goal: Starting with aerobic activity 30 plus minutes a day, 3 days per week for initial exercise prescription. Provide home exercise prescription and guidelines that participant acknowledges  understanding prior to discharge.  Activity Barriers & Risk Stratification:     Activity Barriers & Cardiac Risk Stratification - 11/19/15 1328    Activity Barriers & Cardiac Risk Stratification   Activity Barriers Back Problems;Deconditioning  Bulging disc L2 and L3. Usually will not bother with exercise. Most bother when sitting for periods of time.    Cardiac Risk Stratification High      6 Minute Walk:     6 Minute Walk      11/19/15 1420       6 Minute Walk   Phase Initial     Distance 1300 feet     Walk Time 6 minutes     # of Rest Breaks 0     RPE 13     Symptoms Yes (comment)     Comments short of breath and leg weakness     Resting HR 60 bpm     Resting BP 112/72 mmHg     Max Ex. HR 84 bpm     Max Ex. BP 132/78 mmHg        Initial Exercise Prescription:     Initial Exercise Prescription - 11/19/15 1400    Date of Initial Exercise Prescription   Date 11/19/15   Treadmill   MPH 2   Grade 0   Minutes 15   Bike   Level 1   Minutes 15   Recumbant Bike   Level 2   Watts 30   NuStep   Level 3   Watts 40   Minutes 15   Arm Ergometer   Level 1   Watts 8   Minutes 15   Arm/Foot Ergometer   Level 1   Watts 10   Minutes 15   Cybex   Level 2   RPM 40   Minutes 15   Recumbant Elliptical   Level 2   Watts 20   Minutes 15   Elliptical   Level 1   Speed 3   Minutes 5   REL-XR   Level 3   Watts 40   Minutes 15   T5 Nustep   Level 2   Watts 20   Minutes 15   Biostep-RELP   Level 3   Watts 40   Minutes 15   Prescription Details   Frequency (times per week) 3   Duration Progress to 30 minutes of continuous aerobic without signs/symptoms of physical distress   Intensity   THRR REST +  30   Ratings of Perceived Exertion 11-15   Perceived Dyspnea 2-4   Progression Continue progressive overload as per policy without signs/symptoms or physical distress.   Resistance Training   Training Prescription Yes   Weight 2  Reps 10-15       Exercise Prescription Changes:     Exercise Prescription Changes      11/19/15 1400           Response to Exercise   Blood Pressure (Admit) 112/72 mmHg       Blood Pressure (Exercise) 132/78 mmHg       Blood Pressure (Exit) 122/70 mmHg       Heart Rate (Admit) 60 bpm       Heart Rate (Exercise) 84 bpm       Heart Rate (Exit) 64 bpm       Rating of Perceived Exertion (Exercise) 13          Discharge Exercise Prescription (Final Exercise Prescription Changes):     Exercise Prescription Changes - 11/19/15 1400    Response to Exercise   Blood Pressure (Admit) 112/72 mmHg   Blood Pressure (Exercise) 132/78 mmHg   Blood Pressure (Exit) 122/70 mmHg   Heart Rate (Admit) 60 bpm   Heart Rate (Exercise) 84 bpm   Heart Rate (Exit) 64 bpm   Rating of Perceived Exertion (Exercise) 13      Nutrition:  Target Goals: Understanding of nutrition guidelines, daily intake of sodium 1500mg , cholesterol 200mg , calories 30% from fat and 7% or less from saturated fats, daily to have 5 or more servings of fruits and vegetables.  Biometrics:     Pre Biometrics - 11/19/15 1425    Pre Biometrics   Height 5\' 9"  (1.753 m)   Weight 202 lb 9.6 oz (91.899 kg)   Waist Circumference 44 inches   Hip Circumference 43.5 inches   Waist to Hip Ratio 1.01 %   BMI (Calculated) 30       Nutrition Therapy Plan and Nutrition Goals:     Nutrition Therapy & Goals - 11/19/15 1311    Intervention Plan   Intervention Prescribe, educate and counsel regarding individualized specific dietary modifications aiming towards targeted core components such as weight, hypertension, lipid management, diabetes, heart failure and other comorbidities.   Expected Outcomes Short Term Goal: Understand basic principles of dietary content, such as calories, fat, sodium, cholesterol and nutrients.;Short Term Goal: A plan has been developed with personal nutrition goals set during dietitian appointment.;Long Term Goal:  Adherence to prescribed nutrition plan.      Nutrition Discharge: Rate Your Plate Scores:   Nutrition Goals Re-Evaluation:   Psychosocial: Target Goals: Acknowledge presence or absence of depression, maximize coping skills, provide positive support system. Participant is able to verbalize types and ability to use techniques and skills needed for reducing stress and depression.  Initial Review & Psychosocial Screening:     Initial Psych Review & Screening - 11/19/15 1320    Family Dynamics   Good Support System? Yes  "My wife is fantastic". Has a daughter that is available .   Barriers   Psychosocial barriers to participate in program There are no identifiable barriers or psychosocial needs.;The patient should benefit from training in stress management and relaxation.   Screening Interventions   Interventions Encouraged to exercise      Quality of Life Scores:   PHQ-9:     Recent Review Flowsheet Data    Depression screen [facility] 2/9 11/19/2015   Decreased Interest 0   Down, Depressed, Hopeless 0   PHQ - 2 Score 0   Altered sleeping 0   Tired, decreased energy 3   Change in appetite 3   Feeling bad or failure about yourself  0  Trouble concentrating 0   Moving slowly or fidgety/restless 0   Suicidal thoughts 0   PHQ-9 Score 6   Difficult doing work/chores Somewhat difficult       Psychosocial Evaluation and Intervention:   Psychosocial Re-Evaluation:   Vocational Rehabilitation: Provide vocational rehab assistance to qualifying candidates.   Vocational Rehab Evaluation & Intervention:     Vocational Rehab - 11/19/15 1332    Initial Vocational Rehab Evaluation & Intervention   Assessment shows need for Vocational Rehabilitation No      Education: Education Goals: Education classes will be provided on a weekly basis, covering required topics. Participant will state understanding/return demonstration of topics presented.  Learning Barriers/Preferences:      Learning Barriers/Preferences - 11/19/15 1333    Learning Barriers/Preferences   Learning Barriers Hearing   Learning Preferences None      Education Topics: General Nutrition Guidelines/Fats and Fiber: -Group instruction provided by verbal, written material, models and posters to present the general guidelines for heart healthy nutrition. Gives an explanation and review of dietary fats and fiber.   Controlling Sodium/Reading Food Labels: -Group verbal and written material supporting the discussion of sodium use in heart healthy nutrition. Review and explanation with models, verbal and written materials for utilization of the food label.   Exercise Physiology & Risk Factors: - Group verbal and written instruction with models to review the exercise physiology of the cardiovascular system and associated critical values. Details cardiovascular disease risk factors and the goals associated with each risk factor.   Aerobic Exercise & Resistance Training: - Gives group verbal and written discussion on the health impact of inactivity. On the components of aerobic and resistive training programs and the benefits of this training and how to safely progress through these programs.   Flexibility, Balance, General Exercise Guidelines: - Provides group verbal and written instruction on the benefits of flexibility and balance training programs. Provides general exercise guidelines with specific guidelines to those with heart or lung disease. Demonstration and skill practice provided.   Stress Management: - Provides group verbal and written instruction about the health risks of elevated stress, cause of high stress, and healthy ways to reduce stress.   Depression: - Provides group verbal and written instruction on the correlation between heart/lung disease and depressed mood, treatment options, and the stigmas associated with seeking treatment.   Anatomy & Physiology of the Heart: - Group  verbal and written instruction and models provide basic cardiac anatomy and physiology, with the coronary electrical and arterial systems. Review of: AMI, Angina, Valve disease, Heart Failure, Cardiac Arrhythmia, Pacemakers, and the ICD.   Cardiac Procedures: - Group verbal and written instruction and models to describe the testing methods done to diagnose heart disease. Reviews the outcomes of the test results. Describes the treatment choices: Medical Management, Angioplasty, or Coronary Bypass Surgery.   Cardiac Medications: - Group verbal and written instruction to review commonly prescribed medications for heart disease. Reviews the medication, class of the drug, and side effects. Includes the steps to properly store meds and maintain the prescription regimen.   Go Sex-Intimacy & Heart Disease, Get SMART - Goal Setting: - Group verbal and written instruction through game format to discuss heart disease and the return to sexual intimacy. Provides group verbal and written material to discuss and apply goal setting through the application of the S.M.A.R.T. Method.   Other Matters of the Heart: - Provides group verbal, written materials and models to describe Heart Failure, Angina, Valve Disease, and Diabetes in  the realm of heart disease. Includes description of the disease process and treatment options available to the cardiac patient.   Exercise & Equipment Safety: - Individual verbal instruction and demonstration of equipment use and safety with use of the equipment.          Cardiac Rehab from 11/19/2015 in [facility] Cardiac and Pulmonary Rehab   Date  11/19/15   Educator  SB   Instruction Review Code  2- meets goals/outcomes      Infection Prevention: - Provides verbal and written material to individual with discussion of infection control including proper hand washing and proper equipment cleaning during exercise session.      Cardiac Rehab from 11/19/2015 in [facility] Cardiac and  Pulmonary Rehab   Date  11/19/15   Educator  Sb   Instruction Review Code  2- meets goals/outcomes      Falls Prevention: - Provides verbal and written material to individual with discussion of falls prevention and safety.      Cardiac Rehab from 11/19/2015 in [facility] Cardiac and Pulmonary Rehab   Date  11/19/15   Educator  SB   Instruction Review Code  2- meets goals/outcomes      Diabetes: - Individual verbal and written instruction to review signs/symptoms of diabetes, desired ranges of glucose level fasting, after meals and with exercise. Advice that pre and post exercise glucose checks will be done for 3 sessions at entry of program.    Knowledge Questionnaire Score:     Knowledge Questionnaire Score - 11/19/15 1330    Knowledge Questionnaire Score   Pre Score 24/28      Personal Goals and Risk Factors at Admission:     Personal Goals and Risk Factors at Admission - 11/19/15 1313    Core Components/Risk Factors/Patient Goals on Admission    Weight Management Yes   Intervention Weight Management: Develop a combined nutrition and exercise program designed to reach desired caloric intake, while maintaining appropriate intake of nutrient and fiber, sodium and fats, and appropriate energy expenditure required for the weight goal.;Weight Management: Provide education and appropriate resources to help participant work on and attain dietary goals.;Weight Management/Obesity: Establish reasonable short term and long term weight goals.;Obesity: Provide education and appropriate resources to help participant work on and attain dietary goals.   Admit Weight 202 lb (91.627 kg)   Goal Weight: Short Term 190 lb (86.183 kg)   Goal Weight: Long Term 180 lb (81.647 kg)   Expected Outcomes Short Term: Continue to assess and modify interventions until short term weight is achieved.;Long Term: Adherence to nutrition and physical activity/exercise program aimed toward attainment of established  weight goal.   Sedentary Yes   Intervention Provide advice, education, support and counseling about physical activity/exercise needs.;Develop an individualized exercise prescription for aerobic and resistive training based on initial evaluation findings, risk stratification, comorbidities and participant's personal goals.   Expected Outcomes Achievement of increased cardiorespiratory fitness and enhanced flexibility, muscular endurance and strength shown through measurements of functional capaciy and personal statement of participant.   Lipids Yes  Currently taking niacin for low HDL. Reports his levels have increased to over 50 from in the 20's.   Intervention Provide education and support for participant on nutrition & aerobic/resistive exercise along with prescribed medications to achieve LDL 70mg , HDL >40mg .   Expected Outcomes Short Term: Participant states understanding of desired cholesterol values and is compliant with medications prescribed. Participant is following exercise prescription and nutrition guidelines.;Long Term: Cholesterol controlled with medications as prescribed,  with individualized exercise RX and with personalized nutrition plan. Value goals: LDL < 70mg , HDL > 40 mg.      Personal Goals and Risk Factors Review:    Personal Goals Discharge (Final Personal Goals and Risk Factors Review):    ITP Comments:     ITP Comments      11/19/15 1310           ITP Comments Orientation completed.  Initial ITP  Continue with ITP.              Comments:

## 2015-11-21 ENCOUNTER — Encounter: Payer: Medicare Other | Attending: Cardiology | Admitting: *Deleted

## 2015-11-21 DIAGNOSIS — Z9861 Coronary angioplasty status: Secondary | ICD-10-CM | POA: Insufficient documentation

## 2015-11-21 DIAGNOSIS — I214 Non-ST elevation (NSTEMI) myocardial infarction: Secondary | ICD-10-CM | POA: Diagnosis present

## 2015-11-21 NOTE — Progress Notes (Signed)
Cardiac Individual Treatment Plan  Patient Details  Name: SLY MEDOR MRN: [id number] Date of Birth: [birth year] Referring Provider:  Isaias Cowman, MD  Initial Encounter Date: Date: 11/19/15  Visit Diagnosis: NSTEMI (non-ST elevated myocardial infarction) ([place]) - Plan: CARDIAC REHAB 16 DAY REVIEW  S/P PTCA (percutaneous transluminal coronary angioplasty) - Plan: CARDIAC REHAB 30 DAY REVIEW  Patient's Home Medications on Admission:  Current outpatient prescriptions:  .  acetaminophen (TYLENOL) 325 MG tablet, Take 650 mg by mouth 3 (three) times daily. , Disp: , Rfl:  .  aspirin EC 325 MG EC tablet, Take 1 tablet (325 mg total) by mouth daily., Disp: 30 tablet, Rfl: 0 .  atorvastatin (LIPITOR) 80 MG tablet, Take 40 mg by mouth at bedtime. , Disp: , Rfl:  .  Calcium Carb-Cholecalciferol (CALCIUM 600 + D PO), Take 1 tablet by mouth 2 (two) times daily. Take morning and at noon., Disp: , Rfl:  .  cholecalciferol (VITAMIN D) 1000 units tablet, Take 1,000 Units by mouth 3 (three) times daily., Disp: , Rfl:  .  clopidogrel (PLAVIX) 75 MG tablet, Take 1 tablet (75 mg total) by mouth daily., Disp: 30 tablet, Rfl: 0 .  fexofenadine (ALLEGRA) 180 MG tablet, Take 180 mg by mouth daily before breakfast. , Disp: , Rfl:  .  fluticasone (FLONASE) 50 MCG/ACT nasal spray, Place 2 sprays into both nostrils at bedtime., Disp: , Rfl:  .  isosorbide mononitrate (IMDUR) 60 MG 24 hr tablet, Take 60 mg by mouth daily., Disp: , Rfl:  .  LACTOBACILLUS PO, Take 2 tablets by mouth every morning. , Disp: , Rfl:  .  Magnesium 400 MG CAPS, Take 400 mg by mouth 2 (two) times daily. , Disp: , Rfl:  .  metoprolol (LOPRESSOR) 50 MG tablet, Take 50 mg by mouth 2 (two) times daily., Disp: , Rfl:  .  niacin 500 MG tablet, Take 500 mg by mouth 3 (three) times daily. Take one tablet in the morning, take one tablet in the afternoon and take one tablet at night, Disp: , Rfl:  .  nitroGLYCERIN (NITROSTAT) 0.4 MG SL tablet,  Place 0.4 mg under the tongue every 5 (five) minutes x 3 doses as needed for chest pain. If no relief call MD or go to emergency room., Disp: , Rfl:  .  Omega-3 Fatty Acids (FISH OIL) 1000 MG CAPS, Take 1,000 mg by mouth 2 (two) times daily., Disp: , Rfl:  .  omeprazole (PRILOSEC) 20 MG capsule, Take 20 mg by mouth 2 (two) times daily before a meal., Disp: , Rfl:  .  senna-docusate (SENOKOT-S) 8.6-50 MG per tablet, Take 2 tablets by mouth 2 (two) times daily. 4 TIMES DAILY, Disp: , Rfl:  .  traMADol (ULTRAM) 50 MG tablet, Take 50 mg by mouth 3 (three) times daily. , Disp: , Rfl:  .  vitamin C (ASCORBIC ACID) 500 MG tablet, Take 500 mg by mouth daily., Disp: , Rfl:  .  Zoledronic Acid (RECLAST IV), Inject into the vein. Injection once yearly, Disp: , Rfl:  .  levofloxacin (LEVAQUIN) 750 MG tablet, Take 1 tablet (750 mg total) by mouth daily. (Patient not taking: Reported on 11/19/2015), Disp: 4 tablet, Rfl: 0  Past Medical History: Past Medical History  Diagnosis Date  . Skin cancer   . History of open heart surgery   . CAD (coronary artery disease)   . Arthritis   . DJD (degenerative joint disease)     Tobacco Use: History  Smoking status  .  Former Smoker  . Quit date: 06/26/1990  Smokeless tobacco  . Never Used    Labs: Recent Review Flowsheet Data    Labs for ITP Cardiac and Pulmonary Rehab Latest Ref Rng 11/04/2015   Cholestrol 0 - 200 mg/dL 98   LDLCALC 0 - 99 mg/dL 29   HDL >40 mg/dL 56   Trlycerides <150 mg/dL 64       Exercise Target Goals: Date: 11/19/15  Exercise Program Goal: Individual exercise prescription set with THRR, safety & activity barriers. Participant demonstrates ability to understand and report RPE using BORG scale, to self-measure pulse accurately, and to acknowledge the importance of the exercise prescription.  Exercise Prescription Goal: Starting with aerobic activity 30 plus minutes a day, 3 days per week for initial exercise prescription.  Provide home exercise prescription and guidelines that participant acknowledges understanding prior to discharge.  Activity Barriers & Risk Stratification:     Activity Barriers & Cardiac Risk Stratification - 11/19/15 1328    Activity Barriers & Cardiac Risk Stratification   Activity Barriers Back Problems;Deconditioning  Bulging disc L2 and L3. Usually will not bother with exercise. Most bother when sitting for periods of time.    Cardiac Risk Stratification High      6 Minute Walk:     6 Minute Walk      11/19/15 1420       6 Minute Walk   Phase Initial     Distance 1300 feet     Walk Time 6 minutes     # of Rest Breaks 0     RPE 13     Symptoms Yes (comment)     Comments short of breath and leg weakness     Resting HR 60 bpm     Resting BP 112/72 mmHg     Max Ex. HR 84 bpm     Max Ex. BP 132/78 mmHg        Initial Exercise Prescription:     Initial Exercise Prescription - 11/19/15 1400    Date of Initial Exercise Prescription   Date 11/19/15   Treadmill   MPH 2   Grade 0   Minutes 15   Bike   Level 1   Minutes 15   Recumbant Bike   Level 2   Watts 30   NuStep   Level 3   Watts 40   Minutes 15   Arm Ergometer   Level 1   Watts 8   Minutes 15   Arm/Foot Ergometer   Level 1   Watts 10   Minutes 15   Cybex   Level 2   RPM 40   Minutes 15   Recumbant Elliptical   Level 2   Watts 20   Minutes 15   Elliptical   Level 1   Speed 3   Minutes 5   REL-XR   Level 3   Watts 40   Minutes 15   T5 Nustep   Level 2   Watts 20   Minutes 15   Biostep-RELP   Level 3   Watts 40   Minutes 15   Prescription Details   Frequency (times per week) 3   Duration Progress to 30 minutes of continuous aerobic without signs/symptoms of physical distress   Intensity   THRR REST +  30   Ratings of Perceived Exertion 11-15   Perceived Dyspnea 2-4   Progression   Progression Continue progressive overload as per policy without signs/symptoms or physical  distress.   Resistance Training   Training Prescription Yes   Weight 2   Reps 10-15      Exercise Prescription Changes:     Exercise Prescription Changes      11/19/15 1400           Response to Exercise   Blood Pressure (Admit) 112/72 mmHg       Blood Pressure (Exercise) 132/78 mmHg       Blood Pressure (Exit) 122/70 mmHg       Heart Rate (Admit) 60 bpm       Heart Rate (Exercise) 84 bpm       Heart Rate (Exit) 64 bpm       Rating of Perceived Exertion (Exercise) 13          Discharge Exercise Prescription (Final Exercise Prescription Changes):     Exercise Prescription Changes - 11/19/15 1400    Response to Exercise   Blood Pressure (Admit) 112/72 mmHg   Blood Pressure (Exercise) 132/78 mmHg   Blood Pressure (Exit) 122/70 mmHg   Heart Rate (Admit) 60 bpm   Heart Rate (Exercise) 84 bpm   Heart Rate (Exit) 64 bpm   Rating of Perceived Exertion (Exercise) 13      Nutrition:  Target Goals: Understanding of nutrition guidelines, daily intake of sodium 1500mg , cholesterol 200mg , calories 30% from fat and 7% or less from saturated fats, daily to have 5 or more servings of fruits and vegetables.  Biometrics:     Pre Biometrics - 11/19/15 1425    Pre Biometrics   Height 5\' 9"  (1.753 m)   Weight 202 lb 9.6 oz (91.899 kg)   Waist Circumference 44 inches   Hip Circumference 43.5 inches   Waist to Hip Ratio 1.01 %   BMI (Calculated) 30       Nutrition Therapy Plan and Nutrition Goals:     Nutrition Therapy & Goals - 11/19/15 1311    Intervention Plan   Intervention Prescribe, educate and counsel regarding individualized specific dietary modifications aiming towards targeted core components such as weight, hypertension, lipid management, diabetes, heart failure and other comorbidities.   Expected Outcomes Short Term Goal: Understand basic principles of dietary content, such as calories, fat, sodium, cholesterol and nutrients.;Short Term Goal: A plan has been  developed with personal nutrition goals set during dietitian appointment.;Long Term Goal: Adherence to prescribed nutrition plan.      Nutrition Discharge: Rate Your Plate Scores:   Nutrition Goals Re-Evaluation:   Psychosocial: Target Goals: Acknowledge presence or absence of depression, maximize coping skills, provide positive support system. Participant is able to verbalize types and ability to use techniques and skills needed for reducing stress and depression.  Initial Review & Psychosocial Screening:     Initial Psych Review & Screening - 11/19/15 1320    Family Dynamics   Good Support System? Yes  "My wife is fantastic". Has a daughter that is available .   Barriers   Psychosocial barriers to participate in program There are no identifiable barriers or psychosocial needs.;The patient should benefit from training in stress management and relaxation.   Screening Interventions   Interventions Encouraged to exercise      Quality of Life Scores:     Quality of Life - 11/19/15 1440    Quality of Life Scores   Health/Function Pre 18.7 %   Socioeconomic Pre 25.58 %   Psych/Spiritual Pre 24.86 %   Family Pre 22.8 %   GLOBAL Pre 21.88 %  PHQ-9:     Recent Review Flowsheet Data    Depression screen [facility] 2/9 11/19/2015   Decreased Interest 0   Down, Depressed, Hopeless 0   PHQ - 2 Score 0   Altered sleeping 0   Tired, decreased energy 3   Change in appetite 3   Feeling bad or failure about yourself  0   Trouble concentrating 0   Moving slowly or fidgety/restless 0   Suicidal thoughts 0   PHQ-9 Score 6   Difficult doing work/chores Somewhat difficult       Psychosocial Evaluation and Intervention:   Psychosocial Re-Evaluation:   Vocational Rehabilitation: Provide vocational rehab assistance to qualifying candidates.   Vocational Rehab Evaluation & Intervention:     Vocational Rehab - 11/19/15 1332    Initial Vocational Rehab Evaluation &  Intervention   Assessment shows need for Vocational Rehabilitation No      Education: Education Goals: Education classes will be provided on a weekly basis, covering required topics. Participant will state understanding/return demonstration of topics presented.  Learning Barriers/Preferences:     Learning Barriers/Preferences - 11/19/15 1333    Learning Barriers/Preferences   Learning Barriers Hearing   Learning Preferences None      Education Topics: General Nutrition Guidelines/Fats and Fiber: -Group instruction provided by verbal, written material, models and posters to present the general guidelines for heart healthy nutrition. Gives an explanation and review of dietary fats and fiber.   Controlling Sodium/Reading Food Labels: -Group verbal and written material supporting the discussion of sodium use in heart healthy nutrition. Review and explanation with models, verbal and written materials for utilization of the food label.   Exercise Physiology & Risk Factors: - Group verbal and written instruction with models to review the exercise physiology of the cardiovascular system and associated critical values. Details cardiovascular disease risk factors and the goals associated with each risk factor.   Aerobic Exercise & Resistance Training: - Gives group verbal and written discussion on the health impact of inactivity. On the components of aerobic and resistive training programs and the benefits of this training and how to safely progress through these programs.   Flexibility, Balance, General Exercise Guidelines: - Provides group verbal and written instruction on the benefits of flexibility and balance training programs. Provides general exercise guidelines with specific guidelines to those with heart or lung disease. Demonstration and skill practice provided.   Stress Management: - Provides group verbal and written instruction about the health risks of elevated stress,  cause of high stress, and healthy ways to reduce stress.   Depression: - Provides group verbal and written instruction on the correlation between heart/lung disease and depressed mood, treatment options, and the stigmas associated with seeking treatment.   Anatomy & Physiology of the Heart: - Group verbal and written instruction and models provide basic cardiac anatomy and physiology, with the coronary electrical and arterial systems. Review of: AMI, Angina, Valve disease, Heart Failure, Cardiac Arrhythmia, Pacemakers, and the ICD.   Cardiac Procedures: - Group verbal and written instruction and models to describe the testing methods done to diagnose heart disease. Reviews the outcomes of the test results. Describes the treatment choices: Medical Management, Angioplasty, or Coronary Bypass Surgery.   Cardiac Medications: - Group verbal and written instruction to review commonly prescribed medications for heart disease. Reviews the medication, class of the drug, and side effects. Includes the steps to properly store meds and maintain the prescription regimen.   Go Sex-Intimacy & Heart Disease, Get SMART - Goal  Setting: - Group verbal and written instruction through game format to discuss heart disease and the return to sexual intimacy. Provides group verbal and written material to discuss and apply goal setting through the application of the S.M.A.R.T. Method.   Other Matters of the Heart: - Provides group verbal, written materials and models to describe Heart Failure, Angina, Valve Disease, and Diabetes in the realm of heart disease. Includes description of the disease process and treatment options available to the cardiac patient.   Exercise & Equipment Safety: - Individual verbal instruction and demonstration of equipment use and safety with use of the equipment.          Cardiac Rehab from 11/19/2015 in [facility] Cardiac and Pulmonary Rehab   Date  11/19/15   Educator  SB   Instruction  Review Code  2- meets goals/outcomes      Infection Prevention: - Provides verbal and written material to individual with discussion of infection control including proper hand washing and proper equipment cleaning during exercise session.      Cardiac Rehab from 11/19/2015 in [facility] Cardiac and Pulmonary Rehab   Date  11/19/15   Educator  Sb   Instruction Review Code  2- meets goals/outcomes      Falls Prevention: - Provides verbal and written material to individual with discussion of falls prevention and safety.      Cardiac Rehab from 11/19/2015 in [facility] Cardiac and Pulmonary Rehab   Date  11/19/15   Educator  SB   Instruction Review Code  2- meets goals/outcomes      Diabetes: - Individual verbal and written instruction to review signs/symptoms of diabetes, desired ranges of glucose level fasting, after meals and with exercise. Advice that pre and post exercise glucose checks will be done for 3 sessions at entry of program.    Knowledge Questionnaire Score:     Knowledge Questionnaire Score - 11/19/15 1330    Knowledge Questionnaire Score   Pre Score 24/28      Personal Goals and Risk Factors at Admission:     Personal Goals and Risk Factors at Admission - 11/19/15 1313    Core Components/Risk Factors/Patient Goals on Admission    Weight Management Yes   Intervention Weight Management: Develop a combined nutrition and exercise program designed to reach desired caloric intake, while maintaining appropriate intake of nutrient and fiber, sodium and fats, and appropriate energy expenditure required for the weight goal.;Weight Management: Provide education and appropriate resources to help participant work on and attain dietary goals.;Weight Management/Obesity: Establish reasonable short term and long term weight goals.;Obesity: Provide education and appropriate resources to help participant work on and attain dietary goals.   Admit Weight 202 lb (91.627 kg)   Goal Weight: Short  Term 190 lb (86.183 kg)   Goal Weight: Long Term 180 lb (81.647 kg)   Expected Outcomes Short Term: Continue to assess and modify interventions until short term weight is achieved.;Long Term: Adherence to nutrition and physical activity/exercise program aimed toward attainment of established weight goal.   Sedentary Yes   Intervention Provide advice, education, support and counseling about physical activity/exercise needs.;Develop an individualized exercise prescription for aerobic and resistive training based on initial evaluation findings, risk stratification, comorbidities and participant's personal goals.   Expected Outcomes Achievement of increased cardiorespiratory fitness and enhanced flexibility, muscular endurance and strength shown through measurements of functional capaciy and personal statement of participant.   Lipids Yes  Currently taking niacin for low HDL. Reports his levels have increased to  over 50 from in the 20's.   Intervention Provide education and support for participant on nutrition & aerobic/resistive exercise along with prescribed medications to achieve LDL 70mg , HDL >40mg .   Expected Outcomes Short Term: Participant states understanding of desired cholesterol values and is compliant with medications prescribed. Participant is following exercise prescription and nutrition guidelines.;Long Term: Cholesterol controlled with medications as prescribed, with individualized exercise RX and with personalized nutrition plan. Value goals: LDL < 70mg , HDL > 40 mg.      Personal Goals and Risk Factors Review:    Personal Goals Discharge (Final Personal Goals and Risk Factors Review):    ITP Comments:     ITP Comments      11/19/15 1310 11/21/15 0726         ITP Comments Orientation completed.  Initial ITP  Continue with ITP.     30 day review   Orientation is completed to start sessions this week   Continue with ITP         Comments:

## 2015-11-21 NOTE — Addendum Note (Signed)
Addended by: Lynford Humphrey on: 11/21/2015 07:27 AM   Modules accepted: Orders

## 2015-11-21 NOTE — Progress Notes (Signed)
Daily Session Note  Patient Details  Name: Justin [name] MRN: [id number] Date of Birth: 1941-07-30 Referring Provider:  Isaias Cowman, MD  Encounter Date: 11/21/2015  Check In:     Session Check In - 11/21/15 0856    Check-In   Location [facility]-Cardiac & Pulmonary Rehab   Staff Present Nyoka Cowden, RN;Steven Way, BS, ACSM EP-C, Exercise Physiologist;Adebayo Ensminger, RN, BSN, [place] physician immediately available to respond to emergencies See telemetry face sheet for immediately available ER MD   Medication changes reported     No   Fall or balance concerns reported    No   Warm-up and Cool-down Performed on first and last piece of equipment   Resistance Training Performed No   VAD Patient? No   Pain Assessment   Currently in Pain? No/denies         Goals Met:  Exercise tolerated well No report of cardiac concerns or symptoms  Goals Unmet:  Not Applicable  Comments: Doing well with exercise prescription progression.    Dr. [name] is Medical Director for [place] and [facility].

## 2015-11-23 DIAGNOSIS — Z9861 Coronary angioplasty status: Secondary | ICD-10-CM

## 2015-11-23 DIAGNOSIS — I214 Non-ST elevation (NSTEMI) myocardial infarction: Secondary | ICD-10-CM | POA: Diagnosis not present

## 2015-11-23 NOTE — Progress Notes (Signed)
Daily Session Note  Patient Details  Name: Justin [name] MRN: [id number] Date of Birth: [birth year] Referring Provider:  Isaias Cowman, MD  Encounter Date: 11/23/2015  Check In:     Session Check In - 11/23/15 0948    Check-In   Location [facility]-Cardiac & Pulmonary Rehab   Staff Present Jeanell Sparrow, PT;Susanne Bice, RN, BSN, CCRP;Carroll Enterkin, RN, BSN   Supervising physician immediately available to respond to emergencies See telemetry face sheet for immediately available ER MD   Medication changes reported     No   Fall or balance concerns reported    No   Warm-up and Cool-down Performed on first and last piece of equipment   Resistance Training Performed Yes   VAD Patient? No   Pain Assessment   Currently in Pain? No/denies         Goals Met:  Exercise tolerated well No report of cardiac concerns or symptoms Strength training completed today  Goals Unmet:  Not Applicable  Comments: Patient completed exercise prescription and all exercise goals during rehab session. The exercise was tolerated well and the patient is progressing in the program.    Dr. [name] is Medical Director for [place] and [facility].

## 2015-11-26 ENCOUNTER — Other Ambulatory Visit: Payer: Self-pay | Admitting: Internal Medicine

## 2015-11-26 ENCOUNTER — Ambulatory Visit
Admission: RE | Admit: 2015-11-26 | Discharge: 2015-11-26 | Disposition: A | Discharge status: Home / Self Care | Payer: Medicare Other | Source: Ambulatory Visit | Attending: Internal Medicine | Admitting: Internal Medicine

## 2015-11-26 DIAGNOSIS — I82431 Acute embolism and thrombosis of right popliteal vein: Secondary | ICD-10-CM | POA: Diagnosis not present

## 2015-12-05 DIAGNOSIS — Z9861 Coronary angioplasty status: Secondary | ICD-10-CM

## 2015-12-05 DIAGNOSIS — I214 Non-ST elevation (NSTEMI) myocardial infarction: Secondary | ICD-10-CM

## 2015-12-05 NOTE — Progress Notes (Signed)
Daily Session Note  Patient Details  Name: JASHUN PUERTAS MRN: [id number] Date of Birth: [birth year] Referring Provider:  Isaias Cowman, MD  Encounter Date: 12/05/2015  Check In:     Session Check In - 12/05/15 0829    Check-In   Location [facility]-Cardiac & Pulmonary Rehab   Staff Present Heath Lark, RN, BSN, CCRP;Renee Dillard [place], MS, ACSM CEP, Exercise Physiologist;Puanani Gene, BS, ACSM EP-C, Exercise Physiologist   Supervising physician immediately available to respond to emergencies See telemetry face sheet for immediately available ER MD   Medication changes reported     Yes   Comments Temporarily given Torsemide to deal some leg edema.  Stopping tomorrow.   Fall or balance concerns reported    No   Warm-up and Cool-down Performed on first and last piece of equipment   Resistance Training Performed No   VAD Patient? No   Pain Assessment   Currently in Pain? No/denies         Goals Met:  Proper associated with RPD/PD & O2 Sat Exercise tolerated well No report of cardiac concerns or symptoms Strength training completed today  Goals Unmet:  Not Applicable  Comments:   Dr. [name] is Medical Director for [place] and [facility].

## 2015-12-10 ENCOUNTER — Encounter: Payer: Medicare Other | Admitting: *Deleted

## 2015-12-10 DIAGNOSIS — I214 Non-ST elevation (NSTEMI) myocardial infarction: Secondary | ICD-10-CM | POA: Diagnosis not present

## 2015-12-10 DIAGNOSIS — Z9861 Coronary angioplasty status: Secondary | ICD-10-CM

## 2015-12-10 NOTE — Progress Notes (Signed)
Daily Session Note  Patient Details  Name: Justin [name] MRN: [id number] Date of Birth: Nov 30, 1940 Referring Provider:  Isaias Cowman, MD  Encounter Date: 12/10/2015  Check In:     Session Check In - 12/10/15 0823    Check-In   Location [facility]-Cardiac & Pulmonary Rehab   Staff Present Heath Lark, RN, BSN, Laveda Norman, BS, ACSM CEP, Exercise Physiologist;Renee Dillard [place], MS, ACSM CEP, Exercise Physiologist   Supervising physician immediately available to respond to emergencies See telemetry face sheet for immediately available ER MD   Medication changes reported     No   Fall or balance concerns reported    No   Warm-up and Cool-down Performed on first and last piece of equipment   Resistance Training Performed Yes   VAD Patient? No   Pain Assessment   Currently in Pain? No/denies   Multiple Pain Sites No         Goals Met:  Independence with exercise equipment Exercise tolerated well No report of cardiac concerns or symptoms Strength training completed today  Goals Unmet:  Not Applicable  Comments: Patient completed exercise prescription and all exercise goals during rehab session. The exercise was tolerated well and the patient is progressing in the program.     Dr. [name] is Medical Director for [place] and [facility].

## 2015-12-13 ENCOUNTER — Encounter: Payer: Self-pay | Admitting: *Deleted

## 2015-12-13 DIAGNOSIS — I214 Non-ST elevation (NSTEMI) myocardial infarction: Secondary | ICD-10-CM

## 2015-12-13 DIAGNOSIS — Z9861 Coronary angioplasty status: Secondary | ICD-10-CM

## 2015-12-13 NOTE — Progress Notes (Signed)
Cardiac Individual Treatment Plan  Patient Details  Name: Justin [name] MRN: [id number] Date of Birth: 05/12/1941 Referring Provider:  Isaias Cowman, MD  Initial Encounter Date:       Cardiac Rehab from 11/19/2015 in [facility] Cardiac and Pulmonary Rehab   Date  11/19/15      Visit Diagnosis: S/P PTCA (percutaneous transluminal coronary angioplasty)  NSTEMI (non-ST elevated myocardial infarction) [facility])  Patient's Home Medications on Admission:  Current outpatient prescriptions:  .  acetaminophen (TYLENOL) 325 MG tablet, Take 650 mg by mouth 3 (three) times daily. , Disp: , Rfl:  .  aspirin EC 325 MG EC tablet, Take 1 tablet (325 mg total) by mouth daily., Disp: 30 tablet, Rfl: 0 .  atorvastatin (LIPITOR) 80 MG tablet, Take 40 mg by mouth at bedtime. , Disp: , Rfl:  .  Calcium Carb-Cholecalciferol (CALCIUM 600 + D PO), Take 1 tablet by mouth 2 (two) times daily. Take morning and at noon., Disp: , Rfl:  .  cholecalciferol (VITAMIN D) 1000 units tablet, Take 1,000 Units by mouth 3 (three) times daily., Disp: , Rfl:  .  clopidogrel (PLAVIX) 75 MG tablet, Take 1 tablet (75 mg total) by mouth daily., Disp: 30 tablet, Rfl: 0 .  fexofenadine (ALLEGRA) 180 MG tablet, Take 180 mg by mouth daily before breakfast. , Disp: , Rfl:  .  fluticasone (FLONASE) 50 MCG/ACT nasal spray, Place 2 sprays into both nostrils at bedtime., Disp: , Rfl:  .  isosorbide mononitrate (IMDUR) 60 MG 24 hr tablet, Take 60 mg by mouth daily., Disp: , Rfl:  .  LACTOBACILLUS PO, Take 2 tablets by mouth every morning. , Disp: , Rfl:  .  levofloxacin (LEVAQUIN) 750 MG tablet, Take 1 tablet (750 mg total) by mouth daily. (Patient not taking: Reported on 11/19/2015), Disp: 4 tablet, Rfl: 0 .  Magnesium 400 MG CAPS, Take 400 mg by mouth 2 (two) times daily. , Disp: , Rfl:  .  metoprolol (LOPRESSOR) 50 MG tablet, Take 50 mg by mouth 2 (two) times daily., Disp: , Rfl:  .  niacin 500 MG tablet, Take 500 mg by mouth 3 (three) times  daily. Take one tablet in the morning, take one tablet in the afternoon and take one tablet at night, Disp: , Rfl:  .  nitroGLYCERIN (NITROSTAT) 0.4 MG SL tablet, Place 0.4 mg under the tongue every 5 (five) minutes x 3 doses as needed for chest pain. If no relief call MD or go to emergency room., Disp: , Rfl:  .  Omega-3 Fatty Acids (FISH OIL) 1000 MG CAPS, Take 1,000 mg by mouth 2 (two) times daily., Disp: , Rfl:  .  omeprazole (PRILOSEC) 20 MG capsule, Take 20 mg by mouth 2 (two) times daily before a meal., Disp: , Rfl:  .  senna-docusate (SENOKOT-S) 8.6-50 MG per tablet, Take 2 tablets by mouth 2 (two) times daily. 4 TIMES DAILY, Disp: , Rfl:  .  traMADol (ULTRAM) 50 MG tablet, Take 50 mg by mouth 3 (three) times daily. , Disp: , Rfl:  .  vitamin C (ASCORBIC ACID) 500 MG tablet, Take 500 mg by mouth daily., Disp: , Rfl:  .  Zoledronic Acid (RECLAST IV), Inject into the vein. Injection once yearly, Disp: , Rfl:   Past Medical History: Past Medical History  Diagnosis Date  . Skin cancer   . History of open heart surgery   . CAD (coronary artery disease)   . Arthritis   . DJD (degenerative joint disease)  Tobacco Use: History  Smoking status  . Former Smoker  . Quit date: 06/26/1990  Smokeless tobacco  . Never Used    Labs: Recent Review Flowsheet Data    Labs for ITP Cardiac and Pulmonary Rehab Latest Ref Rng 11/04/2015   Cholestrol 0 - 200 mg/dL 98   LDLCALC 0 - 99 mg/dL 29   HDL >40 mg/dL 56   Trlycerides <150 mg/dL 64       Exercise Target Goals:    Exercise Program Goal: Individual exercise prescription set with THRR, safety & activity barriers. Participant demonstrates ability to understand and report RPE using BORG scale, to self-measure pulse accurately, and to acknowledge the importance of the exercise prescription.  Exercise Prescription Goal: Starting with aerobic activity 30 plus minutes a day, 3 days per week for initial exercise prescription. Provide  home exercise prescription and guidelines that participant acknowledges understanding prior to discharge.  Activity Barriers & Risk Stratification:     Activity Barriers & Cardiac Risk Stratification - 11/19/15 1328    Activity Barriers & Cardiac Risk Stratification   Activity Barriers Back Problems;Deconditioning  Bulging disc L2 and L3. Usually will not bother with exercise. Most bother when sitting for periods of time.    Cardiac Risk Stratification High      6 Minute Walk:     6 Minute Walk      11/19/15 1420       6 Minute Walk   Phase Initial     Distance 1300 feet     Walk Time 6 minutes     # of Rest Breaks 0     RPE 13     Symptoms Yes (comment)     Comments short of breath and leg weakness     Resting HR 60 bpm     Resting BP 112/72 mmHg     Max Ex. HR 84 bpm     Max Ex. BP 132/78 mmHg        Initial Exercise Prescription:     Initial Exercise Prescription - 11/19/15 1400    Date of Initial Exercise Prescription   Date 11/19/15   Treadmill   MPH 2   Grade 0   Minutes 15   Bike   Level 1   Minutes 15   Recumbant Bike   Level 2   Watts 30   NuStep   Level 3   Watts 40   Minutes 15   Arm Ergometer   Level 1   Watts 8   Minutes 15   Arm/Foot Ergometer   Level 1   Watts 10   Minutes 15   Cybex   Level 2   RPM 40   Minutes 15   Recumbant Elliptical   Level 2   Watts 20   Minutes 15   Elliptical   Level 1   Speed 3   Minutes 5   REL-XR   Level 3   Watts 40   Minutes 15   T5 Nustep   Level 2   Watts 20   Minutes 15   Biostep-RELP   Level 3   Watts 40   Minutes 15   Prescription Details   Frequency (times per week) 3   Duration Progress to 30 minutes of continuous aerobic without signs/symptoms of physical distress   Intensity   THRR REST +  30   Ratings of Perceived Exertion 11-15   Perceived Dyspnea 2-4   Progression   Progression Continue progressive overload  as per policy without signs/symptoms or physical distress.    Resistance Training   Training Prescription Yes   Weight 2   Reps 10-15      Perform Capillary Blood Glucose checks as needed.  Exercise Prescription Changes:     Exercise Prescription Changes      11/19/15 1400 12/03/15 1000 12/10/15 0800       Exercise Review   Progression  Yes Yes     Response to Exercise   Blood Pressure (Admit) 112/72 mmHg 130/70 mmHg      Blood Pressure (Exercise) 132/78 mmHg 148/70 mmHg      Blood Pressure (Exit) 122/70 mmHg 110/62 mmHg      Heart Rate (Admit) 60 bpm 65 bpm      Heart Rate (Exercise) 84 bpm 88 bpm      Heart Rate (Exit) 64 bpm 73 bpm      Rating of Perceived Exertion (Exercise) 13 13      Symptoms  None None     Comments  Reviewed individualized exercise prescription and made increases per departmental policy. Exercise increases were discussed with the patient and they were able to perform the new work loads without issue (no signs or symptoms).  Reviewed individualized exercise prescription and made increases per departmental policy. Exercise increases were discussed with the patient and they were able to perform the new work loads without issue (no signs or symptoms). Patient's weight for the strength training portion was increased. All cardiac intensities remained the same today.      Duration  Progress to 45 minutes of aerobic exercise without signs/symptoms of physical distress Progress to 45 minutes of aerobic exercise without signs/symptoms of physical distress     Intensity  Rest + 30 Rest + 30     Progression   Progression  Continue progressive overload as per policy without signs/symptoms or physical distress. Continue progressive overload as per policy without signs/symptoms or physical distress.     Resistance Training   Training Prescription  Yes Yes     Weight  2 3     Reps  10-15 10-15     Treadmill   MPH  3 3     Grade  0 0     Minutes  15 15     Bike   Level  1 1     Minutes  15 15     Recumbant Bike   Level  2 2      Watts  30 30     NuStep   Level  3 3     Watts  40 40     Minutes  15 15     Arm Ergometer   Level  1 1     Watts  8 8     Minutes  15 15     Arm/Foot Ergometer   Level  1 1     Watts  10 10     Minutes  15 15     Cybex   Level  2 2     RPM  40 40     Minutes  15 15     Recumbant Elliptical   Level  2 2     Watts  20 20     Minutes  15 15     Elliptical   Level  1 1     Speed  3 3     Minutes  5 5     REL-XR  Level  3 3     Watts  40 40     Minutes  15 15     T5 Nustep   Level  2 2     Watts  20 20     Minutes  15 15     Biostep-RELP   Level  3 3     Watts  40 40     Minutes  15 15        Exercise Comments:   Discharge Exercise Prescription (Final Exercise Prescription Changes):     Exercise Prescription Changes - 12/10/15 0800    Exercise Review   Progression Yes   Response to Exercise   Symptoms None   Comments Reviewed individualized exercise prescription and made increases per departmental policy. Exercise increases were discussed with the patient and they were able to perform the new work loads without issue (no signs or symptoms). Patient's weight for the strength training portion was increased. All cardiac intensities remained the same today.    Duration Progress to 45 minutes of aerobic exercise without signs/symptoms of physical distress   Intensity Rest + 30   Progression   Progression Continue progressive overload as per policy without signs/symptoms or physical distress.   Resistance Training   Training Prescription Yes   Weight 3   Reps 10-15   Treadmill   MPH 3   Grade 0   Minutes 15   Bike   Level 1   Minutes 15   Recumbant Bike   Level 2   Watts 30   NuStep   Level 3   Watts 40   Minutes 15   Arm Ergometer   Level 1   Watts 8   Minutes 15   Arm/Foot Ergometer   Level 1   Watts 10   Minutes 15   Cybex   Level 2   RPM 40   Minutes 15   Recumbant Elliptical   Level 2   Watts 20   Minutes 15   Elliptical    Level 1   Speed 3   Minutes 5   REL-XR   Level 3   Watts 40   Minutes 15   T5 Nustep   Level 2   Watts 20   Minutes 15   Biostep-RELP   Level 3   Watts 40   Minutes 15      Nutrition:  Target Goals: Understanding of nutrition guidelines, daily intake of sodium <1535m, cholesterol <20110m calories 30% from fat and 7% or less from saturated fats, daily to have 5 or more servings of fruits and vegetables.  Biometrics:     Pre Biometrics - 11/19/15 1425    Pre Biometrics   Height _0  (1.753 m)   Weight 202 lb 9.6 oz (91.899 kg)   Waist Circumference 44 inches   Hip Circumference 43.5 inches   Waist to Hip Ratio 1.01 %   BMI (Calculated) 30       Nutrition Therapy Plan and Nutrition Goals:     Nutrition Therapy & Goals - 11/19/15 1311    Intervention Plan   Intervention Prescribe, educate and counsel regarding individualized specific dietary modifications aiming towards targeted core components such as weight, hypertension, lipid management, diabetes, heart failure and other comorbidities.   Expected Outcomes Short Term Goal: Understand basic principles of dietary content, such as calories, fat, sodium, cholesterol and nutrients.;Short Term Goal: A plan has been developed with personal nutrition goals set during dietitian appointment.;Long Term  Goal: Adherence to prescribed nutrition plan.      Nutrition Discharge: Rate Your Plate Scores:   Nutrition Goals Re-Evaluation:   Psychosocial: Target Goals: Acknowledge presence or absence of depression, maximize coping skills, provide positive support system. Participant is able to verbalize types and ability to use techniques and skills needed for reducing stress and depression.  Initial Review & Psychosocial Screening:     Initial Psych Review & Screening - 11/19/15 1320    Family Dynamics   Good Support System? Yes  "My wife is fantastic". Has a daughter that is available .   Barriers   Psychosocial barriers  to participate in program There are no identifiable barriers or psychosocial needs.;The patient should benefit from training in stress management and relaxation.   Screening Interventions   Interventions Encouraged to exercise      Quality of Life Scores:     Quality of Life - 11/19/15 1440    Quality of Life Scores   Health/Function Pre 18.7 %   Socioeconomic Pre 25.58 %   Psych/Spiritual Pre 24.86 %   Family Pre 22.8 %   GLOBAL Pre 21.88 %      PHQ-9:     Recent Review Flowsheet Data    Depression screen [facility] 2/9 11/19/2015   Decreased Interest 0   Down, Depressed, Hopeless 0   PHQ - 2 Score 0   Altered sleeping 0   Tired, decreased energy 3   Change in appetite 3   Feeling bad or failure about yourself  0   Trouble concentrating 0   Moving slowly or fidgety/restless 0   Suicidal thoughts 0   PHQ-9 Score 6   Difficult doing work/chores Somewhat difficult       Psychosocial Evaluation and Intervention:     Psychosocial Evaluation - 11/21/15 0935    Psychosocial Evaluation & Interventions   Interventions Encouraged to exercise with the program and follow exercise prescription   Comments Counselor met with Mr. Errico today for initial psychosocial evaluation.  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  He has a strong support system with a spouse of 6 years and a daughter who lives close by.  He also is actively involved in his local church community.  Mr. Loeza has other medical issues that impact his health, including severe osteoporsis and sleep apnea.  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  Mr. Freeland has a history of sleep apnea and states he uses a ventilator at night.  He has a good appetite and denies a history or current symptoms of depresion or anxiety.  Mr. Cuffe states he is typically in a positive mood and has minimal stress in his life at this time.  His goals for this program are to breathe better and lose  some weight.  He has a treadmill at home and plans to begin using that and join [employer] following completion of this program.        Psychosocial Re-Evaluation:   Vocational Rehabilitation: Provide vocational rehab assistance to qualifying candidates.   Vocational Rehab Evaluation & Intervention:     Vocational Rehab - 11/19/15 1332    Initial Vocational Rehab Evaluation & Intervention   Assessment shows need for Vocational Rehabilitation No      Education: Education Goals: Education classes will be provided on a weekly basis, covering required topics. Participant will state understanding/return demonstration of topics presented.  Learning Barriers/Preferences:     Learning  Barriers/Preferences - 11/19/15 1333    Learning Barriers/Preferences   Learning Barriers Hearing   Learning Preferences None      Education Topics: General Nutrition Guidelines/Fats and Fiber: -Group instruction provided by verbal, written material, models and posters to present the general guidelines for heart healthy nutrition. Gives an explanation and review of dietary fats and fiber.   Controlling Sodium/Reading Food Labels: -Group verbal and written material supporting the discussion of sodium use in heart healthy nutrition. Review and explanation with models, verbal and written materials for utilization of the food label.   Exercise Physiology & Risk Factors: - Group verbal and written instruction with models to review the exercise physiology of the cardiovascular system and associated critical values. Details cardiovascular disease risk factors and the goals associated with each risk factor.   Aerobic Exercise & Resistance Training: - Gives group verbal and written discussion on the health impact of inactivity. On the components of aerobic and resistive training programs and the benefits of this training and how to safely progress through these programs.          Cardiac Rehab from  12/10/2015 in [facility] Cardiac and Pulmonary Rehab   Date  11/21/15   Educator  SW   Instruction Review Code  2- meets goals/outcomes      Flexibility, Balance, General Exercise Guidelines: - Provides group verbal and written instruction on the benefits of flexibility and balance training programs. Provides general exercise guidelines with specific guidelines to those with heart or lung disease. Demonstration and skill practice provided.   Stress Management: - Provides group verbal and written instruction about the health risks of elevated stress, cause of high stress, and healthy ways to reduce stress.   Depression: - Provides group verbal and written instruction on the correlation between heart/lung disease and depressed mood, treatment options, and the stigmas associated with seeking treatment.   Anatomy & Physiology of the Heart: - Group verbal and written instruction and models provide basic cardiac anatomy and physiology, with the coronary electrical and arterial systems. Review of: AMI, Angina, Valve disease, Heart Failure, Cardiac Arrhythmia, Pacemakers, and the ICD.   Cardiac Procedures: - Group verbal and written instruction and models to describe the testing methods done to diagnose heart disease. Reviews the outcomes of the test results. Describes the treatment choices: Medical Management, Angioplasty, or Coronary Bypass Surgery.      Cardiac Rehab from 12/10/2015 in [facility] Cardiac and Pulmonary Rehab   Date  12/10/15   Educator  SB   Instruction Review Code  2- meets goals/outcomes      Cardiac Medications: - Group verbal and written instruction to review commonly prescribed medications for heart disease. Reviews the medication, class of the drug, and side effects. Includes the steps to properly store meds and maintain the prescription regimen.   Go Sex-Intimacy & Heart Disease, Get SMART - Goal Setting: - Group verbal and written instruction through game format to discuss  heart disease and the return to sexual intimacy. Provides group verbal and written material to discuss and apply goal setting through the application of the S.M.A.R.T. Method.      Cardiac Rehab from 12/10/2015 in [facility] Cardiac and Pulmonary Rehab   Date  12/10/15   Educator  SB   Instruction Review Code  2- meets goals/outcomes      Other Matters of the Heart: - Provides group verbal, written materials and models to describe Heart Failure, Angina, Valve Disease, and Diabetes in the realm of heart disease. Includes description  of the disease process and treatment options available to the cardiac patient.   Exercise & Equipment Safety: - Individual verbal instruction and demonstration of equipment use and safety with use of the equipment.      Cardiac Rehab from 12/10/2015 in [facility] Cardiac and Pulmonary Rehab   Date  11/19/15   Educator  SB   Instruction Review Code  2- meets goals/outcomes      Infection Prevention: - Provides verbal and written material to individual with discussion of infection control including proper hand washing and proper equipment cleaning during exercise session.      Cardiac Rehab from 12/10/2015 in [facility] Cardiac and Pulmonary Rehab   Date  11/19/15   Educator  Sb   Instruction Review Code  2- meets goals/outcomes      Falls Prevention: - Provides verbal and written material to individual with discussion of falls prevention and safety.      Cardiac Rehab from 12/10/2015 in [facility] Cardiac and Pulmonary Rehab   Date  11/19/15   Educator  SB   Instruction Review Code  2- meets goals/outcomes      Diabetes: - Individual verbal and written instruction to review signs/symptoms of diabetes, desired ranges of glucose level fasting, after meals and with exercise. Advice that pre and post exercise glucose checks will be done for 3 sessions at entry of program.    Knowledge Questionnaire Score:     Knowledge Questionnaire Score - 11/19/15 1330    Knowledge  Questionnaire Score   Pre Score 24/28      Personal Goals and Risk Factors at Admission:     Personal Goals and Risk Factors at Admission - 11/19/15 1313    Core Components/Risk Factors/Patient Goals on Admission    Weight Management Yes   Intervention Weight Management: Develop a combined nutrition and exercise program designed to reach desired caloric intake, while maintaining appropriate intake of nutrient and fiber, sodium and fats, and appropriate energy expenditure required for the weight goal.;Weight Management: Provide education and appropriate resources to help participant work on and attain dietary goals.;Weight Management/Obesity: Establish reasonable short term and long term weight goals.;Obesity: Provide education and appropriate resources to help participant work on and attain dietary goals.   Admit Weight 202 lb (91.627 kg)   Goal Weight: Short Term 190 lb (86.183 kg)   Goal Weight: Long Term 180 lb (81.647 kg)   Expected Outcomes Short Term: Continue to assess and modify interventions until short term weight is achieved.;Long Term: Adherence to nutrition and physical activity/exercise program aimed toward attainment of established weight goal.   Sedentary Yes   Intervention Provide advice, education, support and counseling about physical activity/exercise needs.;Develop an individualized exercise prescription for aerobic and resistive training based on initial evaluation findings, risk stratification, comorbidities and participant's personal goals.   Expected Outcomes Achievement of increased cardiorespiratory fitness and enhanced flexibility, muscular endurance and strength shown through measurements of functional capaciy and personal statement of participant.   Lipids Yes  Currently taking niacin for low HDL. Reports his levels have increased to over 50 from in the 20's.   Intervention Provide education and support for participant on nutrition & aerobic/resistive exercise along  with prescribed medications to achieve LDL <23m, HDL >471m   Expected Outcomes Short Term: Participant states understanding of desired cholesterol values and is compliant with medications prescribed. Participant is following exercise prescription and nutrition guidelines.;Long Term: Cholesterol controlled with medications as prescribed, with individualized exercise RX and with personalized nutrition plan. Value goals:  LDL < 74m, HDL > 40 mg.      Personal Goals and Risk Factors Review:      Goals and Risk Factor Review      12/10/15 1548           Core Components/Risk Factors/Patient Goals Review   Personal Goals Review Sedentary;Weight Management/Obesity;Lipids       Review Spoke with [email or web address]. He has been here 5 sessions.  PLans to continue with the program and hopes to see progress with weight loss as he sontinues with the exercise and nutrition plan he is providied. He is compliant with his meds and reports that his HDL is improved to greater than 50 from in the 20;s and that his bad cholseterol is low.        Expected Outcomes Continue with the exercise and nutrition plan to maintain risk factor control and working toward weight loss.          Personal Goals Discharge (Final Personal Goals and Risk Factors Review):      Goals and Risk Factor Review - 12/10/15 1548    Core Components/Risk Factors/Patient Goals Review   Personal Goals Review Sedentary;Weight Management/Obesity;Lipids   Review Spoke with DNoletoday. He has been here 5 sessions.  PLans to continue with the program and hopes to see progress with weight loss as he sontinues with the exercise and nutrition plan he is providied. He is compliant with his meds and reports that his HDL is improved to greater than 50 from in the 20;s and that his bad cholseterol is low.    Expected Outcomes Continue with the exercise and nutrition plan to maintain risk factor control and working toward weight loss.      ITP  Comments:     ITP Comments      11/19/15 1310 11/21/15 0726         ITP Comments Orientation completed.  Initial ITP  Continue with ITP.     30 day review   Orientation is completed to start sessions this week   Continue with ITP         Comments:

## 2015-12-14 ENCOUNTER — Encounter: Payer: Medicare Other | Admitting: *Deleted

## 2015-12-14 DIAGNOSIS — I214 Non-ST elevation (NSTEMI) myocardial infarction: Secondary | ICD-10-CM | POA: Diagnosis not present

## 2015-12-14 DIAGNOSIS — Z9861 Coronary angioplasty status: Secondary | ICD-10-CM

## 2015-12-14 NOTE — Progress Notes (Signed)
Daily Session Note  Patient Details  Name: Justin [name] MRN: [id number] Date of Birth: May 17, 1941 Referring Provider:  Isaias Cowman, MD  Encounter Date: 12/14/2015  Check In:     Session Check In - 12/14/15 0911    Check-In   Location [facility]   Staff Present Heath Lark, RN, BSN, CCRP;Haelie Clapp, RN, Michaela Corner, RRT, RCP, Respiratory Therapist   Supervising physician immediately available to respond to emergencies See telemetry face sheet for immediately available ER MD   Medication changes reported     No   Fall or balance concerns reported    No   Warm-up and Cool-down Performed on first and last piece of equipment   Resistance Training Performed Yes   VAD Patient? No   Pain Assessment   Currently in Pain? No/denies           Exercise Prescription Changes - 12/14/15 0900    Exercise Review   Progression Yes   Response to Exercise   Symptoms None   Comments Reviewed individualized exercise prescription and made increases per departmental policy. Exercise increases were discussed with the patient and they were able to perform the new work loads without issue (no signs or symptoms). Patient's weight for the strength training portion was increased. All cardiac intensities remained the same today.    Duration Progress to 45 minutes of aerobic exercise without signs/symptoms of physical distress   Intensity Rest + 30   Progression   Progression Continue progressive overload as per policy without signs/symptoms or physical distress.   Resistance Training   Training Prescription Yes   Weight 4   Reps 10-15   Treadmill   MPH 3   Grade 0   Minutes 15   Bike   Level 1   Minutes 15   Recumbant Bike   Level 2   Watts 30   NuStep   Level 3   Watts 40   Minutes 15   Arm Ergometer   Level 1   Watts 8   Minutes 15   Arm/Foot Ergometer   Level 1   Watts 10   Minutes 15   Cybex   Level 2   RPM 40   Minutes 15   Recumbant  Elliptical   Level 2   Watts 20   Minutes 15   Elliptical   Level 1   Speed 3   Minutes 5   REL-XR   Level 3   Watts 40   Minutes 15   T5 Nustep   Level 2   Watts 20   Minutes 15   Biostep-RELP   Level 3   Watts 40   Minutes 15      Goals Met:  Proper associated with RPD/PD & O2 Sat Exercise tolerated well  Goals Unmet:  Not Applicable  Comments:    Dr. [name] is Medical Director for [place] and [facility].

## 2015-12-17 ENCOUNTER — Encounter: Payer: Medicare Other | Admitting: *Deleted

## 2015-12-17 DIAGNOSIS — I214 Non-ST elevation (NSTEMI) myocardial infarction: Secondary | ICD-10-CM | POA: Diagnosis not present

## 2015-12-17 DIAGNOSIS — Z9861 Coronary angioplasty status: Secondary | ICD-10-CM

## 2015-12-17 NOTE — Progress Notes (Signed)
Daily Session Note  Patient Details  Name: Justin [name] MRN: [id number] Date of Birth: 07-27-1941 Referring Provider:  Isaias Cowman, MD  Encounter Date: 12/17/2015  Check In:     Session Check In - 12/17/15 0832    Check-In   Location [facility]   Staff Present Heath Lark, RN, BSN, Laveda Norman, BS, ACSM CEP, Exercise Physiologist;Renee Dillard Essex, MS, ACSM CEP, Exercise Physiologist   Supervising physician immediately available to respond to emergencies See telemetry face sheet for immediately available ER MD   Medication changes reported     No   Fall or balance concerns reported    No   Warm-up and Cool-down Performed on first and last piece of equipment   Resistance Training Performed Yes   VAD Patient? No   Pain Assessment   Currently in Pain? No/denies   Multiple Pain Sites No         Goals Met:  Independence with exercise equipment Exercise tolerated well No report of cardiac concerns or symptoms Strength training completed today  Goals Unmet:  Not Applicable  Comments: Patient completed exercise prescription and all exercise goals during rehab session. The exercise was tolerated well and the patient is progressing in the program.     Dr. [name] is Medical Director for [place] and [facility].

## 2015-12-19 DIAGNOSIS — I214 Non-ST elevation (NSTEMI) myocardial infarction: Secondary | ICD-10-CM | POA: Diagnosis not present

## 2015-12-19 DIAGNOSIS — Z9861 Coronary angioplasty status: Secondary | ICD-10-CM

## 2015-12-19 NOTE — Progress Notes (Signed)
Daily Session Note  Patient Details  Name: Justin [name] MRN: [id number] Date of Birth: 1941-01-26 Referring Provider:  Isaias Cowman, MD  Encounter Date: 12/19/2015  Check In:     Session Check In - 12/19/15 0836    Check-In   Location [facility]   Staff Present Heath Lark, RN, BSN, CCRP;Renee Dillard [place], MS, ACSM CEP, Exercise Physiologist;Charda Janis, BS, ACSM EP-C, Exercise Physiologist   Supervising physician immediately available to respond to emergencies See telemetry face sheet for immediately available ER MD   Medication changes reported     No   Fall or balance concerns reported    No   Warm-up and Cool-down Performed on first and last piece of equipment   Resistance Training Performed No   VAD Patient? No   Pain Assessment   Currently in Pain? No/denies         Goals Met:  Proper associated with RPD/PD & O2 Sat Exercise tolerated well No report of cardiac concerns or symptoms Strength training completed today  Goals Unmet:  Not Applicable  Comments:   Dr. [name] is Medical Director for [place] and [facility].

## 2015-12-21 ENCOUNTER — Encounter: Payer: Medicare Other | Admitting: *Deleted

## 2015-12-21 DIAGNOSIS — I214 Non-ST elevation (NSTEMI) myocardial infarction: Secondary | ICD-10-CM | POA: Diagnosis not present

## 2015-12-21 NOTE — Progress Notes (Signed)
Daily Session Note  Patient Details  Name: Justin [name] MRN: [id number] Date of Birth: March 18, 1941 Referring Provider:  Isaias Cowman, MD  Encounter Date: 12/21/2015  Check In:     Session Check In - 12/21/15 0841    Check-In   Location [facility]   Staff Present Gerlene Burdock, RN, Drusilla Kanner, MS, ACSM CEP, Exercise Physiologist;Susanne Bice, RN, BSN, CCRP   Supervising physician immediately available to respond to emergencies See telemetry face sheet for immediately available ER MD   Medication changes reported     No   Fall or balance concerns reported    No   Warm-up and Cool-down Performed on first and last piece of equipment   Resistance Training Performed Yes   VAD Patient? No   Pain Assessment   Currently in Pain? No/denies   Multiple Pain Sites No         Goals Met:  Independence with exercise equipment Exercise tolerated well No report of cardiac concerns or symptoms Strength training completed today  Goals Unmet:  Not Applicable  Comments: Patient completed exercise prescription and all exercise goals during rehab session. The exercise was tolerated well and the patient is progressing in the program.   Dr. [name] is Medical Director for [place] and [facility].

## 2015-12-24 ENCOUNTER — Encounter: Payer: Medicare Other | Attending: Cardiology

## 2015-12-24 DIAGNOSIS — Z9861 Coronary angioplasty status: Secondary | ICD-10-CM | POA: Insufficient documentation

## 2015-12-24 DIAGNOSIS — I214 Non-ST elevation (NSTEMI) myocardial infarction: Secondary | ICD-10-CM | POA: Insufficient documentation

## 2015-12-26 ENCOUNTER — Encounter: Payer: Medicare Other | Admitting: *Deleted

## 2015-12-26 DIAGNOSIS — I214 Non-ST elevation (NSTEMI) myocardial infarction: Secondary | ICD-10-CM

## 2015-12-26 DIAGNOSIS — Z9861 Coronary angioplasty status: Secondary | ICD-10-CM

## 2015-12-26 NOTE — Progress Notes (Signed)
Daily Session Note  Patient Details  Name: [name] MRN: [id number] Date of Birth: [birth year] Referring Provider:  Isaias Cowman, MD  Encounter Date: 12/26/2015  Check In:     Session Check In - 12/26/15 0825    Check-In   Location [facility]   Staff Present Gerlene Burdock, RN, Moises Blood, BS, ACSM CEP, Exercise Physiologist;Mary Kellie Shropshire, RN   Supervising physician immediately available to respond to emergencies See telemetry face sheet for immediately available ER MD   Medication changes reported     No   Fall or balance concerns reported    No   Warm-up and Cool-down Performed on first and last piece of equipment   Resistance Training Performed No   VAD Patient? No   Pain Assessment   Currently in Pain? No/denies         Goals Met:  Proper associated with RPD/PD & O2 Sat Exercise tolerated well  Goals Unmet:  Not Applicable  Comments:    Dr. [name] is Medical Director for [place] [facility].

## 2015-12-26 NOTE — Progress Notes (Signed)
Cardiac Individual Treatment Plan  Patient Details  Name: Justin [name] MRN: [id number] Date of Birth: 20-May-1941 Referring Provider:  Isaias Cowman, MD  Initial Encounter Date:       Cardiac Rehab from 11/19/2015 in [facility] Cardiac and Pulmonary Rehab   Date  11/19/15      Visit Diagnosis: NSTEMI (non-ST elevated myocardial infarction) ([place])  S/P PTCA (percutaneous transluminal coronary angioplasty)  Patient's Home Medications on Admission:  Current outpatient prescriptions:  .  acetaminophen (TYLENOL) 325 MG tablet, Take 650 mg by mouth 3 (three) times daily. , Disp: , Rfl:  .  aspirin EC 325 MG EC tablet, Take 1 tablet (325 mg total) by mouth daily., Disp: 30 tablet, Rfl: 0 .  atorvastatin (LIPITOR) 80 MG tablet, Take 40 mg by mouth at bedtime. , Disp: , Rfl:  .  Calcium Carb-Cholecalciferol (CALCIUM 600 + D PO), Take 1 tablet by mouth 2 (two) times daily. Take morning and at noon., Disp: , Rfl:  .  cholecalciferol (VITAMIN D) 1000 units tablet, Take 1,000 Units by mouth 3 (three) times daily., Disp: , Rfl:  .  clopidogrel (PLAVIX) 75 MG tablet, Take 1 tablet (75 mg total) by mouth daily., Disp: 30 tablet, Rfl: 0 .  fexofenadine (ALLEGRA) 180 MG tablet, Take 180 mg by mouth daily before breakfast. , Disp: , Rfl:  .  fluticasone (FLONASE) 50 MCG/ACT nasal spray, Place 2 sprays into both nostrils at bedtime., Disp: , Rfl:  .  isosorbide mononitrate (IMDUR) 60 MG 24 hr tablet, Take 60 mg by mouth daily., Disp: , Rfl:  .  LACTOBACILLUS PO, Take 2 tablets by mouth every morning. , Disp: , Rfl:  .  levofloxacin (LEVAQUIN) 750 MG tablet, Take 1 tablet (750 mg total) by mouth daily. (Patient not taking: Reported on 11/19/2015), Disp: 4 tablet, Rfl: 0 .  Magnesium 400 MG CAPS, Take 400 mg by mouth 2 (two) times daily. , Disp: , Rfl:  .  metoprolol (LOPRESSOR) 50 MG tablet, Take 50 mg by mouth 2 (two) times daily., Disp: , Rfl:  .  niacin 500 MG tablet, Take 500 mg by mouth 3 (three) times  daily. Take one tablet in the morning, take one tablet in the afternoon and take one tablet at night, Disp: , Rfl:  .  nitroGLYCERIN (NITROSTAT) 0.4 MG SL tablet, Place 0.4 mg under the tongue every 5 (five) minutes x 3 doses as needed for chest pain. If no relief call MD or go to emergency room., Disp: , Rfl:  .  Omega-3 Fatty Acids (FISH OIL) 1000 MG CAPS, Take 1,000 mg by mouth 2 (two) times daily., Disp: , Rfl:  .  omeprazole (PRILOSEC) 20 MG capsule, Take 20 mg by mouth 2 (two) times daily before a meal., Disp: , Rfl:  .  senna-docusate (SENOKOT-S) 8.6-50 MG per tablet, Take 2 tablets by mouth 2 (two) times daily. 4 TIMES DAILY, Disp: , Rfl:  .  traMADol (ULTRAM) 50 MG tablet, Take 50 mg by mouth 3 (three) times daily. , Disp: , Rfl:  .  vitamin C (ASCORBIC ACID) 500 MG tablet, Take 500 mg by mouth daily., Disp: , Rfl:  .  Zoledronic Acid (RECLAST IV), Inject into the vein. Injection once yearly, Disp: , Rfl:   Past Medical History: Past Medical History  Diagnosis Date  . Skin cancer   . History of open heart surgery   . CAD (coronary artery disease)   . Arthritis   . DJD (degenerative joint disease)  Tobacco Use: History  Smoking status  . Former Smoker  . Quit date: 06/26/1990  Smokeless tobacco  . Never Used    Labs: Recent Review Flowsheet Data    Labs for ITP Cardiac and Pulmonary Rehab Latest Ref Rng 11/04/2015   Cholestrol 0 - 200 mg/dL 98   LDLCALC 0 - 99 mg/dL 29   HDL >40 mg/dL 56   Trlycerides <150 mg/dL 64       Exercise Target Goals:    Exercise Program Goal: Individual exercise prescription set with THRR, safety & activity barriers. Participant demonstrates ability to understand and report RPE using BORG scale, to self-measure pulse accurately, and to acknowledge the importance of the exercise prescription.  Exercise Prescription Goal: Starting with aerobic activity 30 plus minutes a day, 3 days per week for initial exercise prescription. Provide  home exercise prescription and guidelines that participant acknowledges understanding prior to discharge.  Activity Barriers & Risk Stratification:     Activity Barriers & Cardiac Risk Stratification - 11/19/15 1328    Activity Barriers & Cardiac Risk Stratification   Activity Barriers Back Problems;Deconditioning  Bulging disc L2 and L3. Usually will not bother with exercise. Most bother when sitting for periods of time.    Cardiac Risk Stratification High      6 Minute Walk:     6 Minute Walk      11/19/15 1420       6 Minute Walk   Phase Initial     Distance 1300 feet     Walk Time 6 minutes     # of Rest Breaks 0     RPE 13     Symptoms Yes (comment)     Comments short of breath and leg weakness     Resting HR 60 bpm     Resting BP 112/72 mmHg     Max Ex. HR 84 bpm     Max Ex. BP 132/78 mmHg        Initial Exercise Prescription:     Initial Exercise Prescription - 11/19/15 1400    Date of Initial Exercise Prescription   Date 11/19/15   Treadmill   MPH 2   Grade 0   Minutes 15   Bike   Level 1   Minutes 15   Recumbant Bike   Level 2   Watts 30   NuStep   Level 3   Watts 40   Minutes 15   Arm Ergometer   Level 1   Watts 8   Minutes 15   Arm/Foot Ergometer   Level 1   Watts 10   Minutes 15   Cybex   Level 2   RPM 40   Minutes 15   Recumbant Elliptical   Level 2   Watts 20   Minutes 15   Elliptical   Level 1   Speed 3   Minutes 5   REL-XR   Level 3   Watts 40   Minutes 15   T5 Nustep   Level 2   Watts 20   Minutes 15   Biostep-RELP   Level 3   Watts 40   Minutes 15   Prescription Details   Frequency (times per week) 3   Duration Progress to 30 minutes of continuous aerobic without signs/symptoms of physical distress   Intensity   THRR REST +  30   Ratings of Perceived Exertion 11-15   Perceived Dyspnea 2-4   Progression   Progression Continue progressive overload  as per policy without signs/symptoms or physical distress.    Resistance Training   Training Prescription Yes   Weight 2   Reps 10-15      Perform Capillary Blood Glucose checks as needed.  Exercise Prescription Changes:     Exercise Prescription Changes      11/19/15 1400 12/03/15 1000 12/10/15 0800 12/14/15 0900     Exercise Review   Progression  Yes Yes Yes    Response to Exercise   Blood Pressure (Admit) 112/72 mmHg 130/70 mmHg      Blood Pressure (Exercise) 132/78 mmHg 148/70 mmHg      Blood Pressure (Exit) 122/70 mmHg 110/62 mmHg      Heart Rate (Admit) 60 bpm 65 bpm      Heart Rate (Exercise) 84 bpm 88 bpm      Heart Rate (Exit) 64 bpm 73 bpm      Rating of Perceived Exertion (Exercise) 13 13      Symptoms  None None None    Comments  Reviewed individualized exercise prescription and made increases per departmental policy. Exercise increases were discussed with the patient and they were able to perform the new work loads without issue (no signs or symptoms).  Reviewed individualized exercise prescription and made increases per departmental policy. Exercise increases were discussed with the patient and they were able to perform the new work loads without issue (no signs or symptoms). Patient's weight for the strength training portion was increased. All cardiac intensities remained the same today.  Reviewed individualized exercise prescription and made increases per departmental policy. Exercise increases were discussed with the patient and they were able to perform the new work loads without issue (no signs or symptoms). Patient's weight for the strength training portion was increased. All cardiac intensities remained the same today.     Duration  Progress to 45 minutes of aerobic exercise without signs/symptoms of physical distress Progress to 45 minutes of aerobic exercise without signs/symptoms of physical distress Progress to 45 minutes of aerobic exercise without signs/symptoms of physical distress    Intensity  Rest + 30 Rest + 30  Rest + 30    Progression   Progression  Continue progressive overload as per policy without signs/symptoms or physical distress. Continue progressive overload as per policy without signs/symptoms or physical distress. Continue progressive overload as per policy without signs/symptoms or physical distress.    Resistance Training   Training Prescription  Yes Yes Yes    Weight  _0 Reps  10-15 10-15 10-15    Treadmill   MPH  _1 Grade  0 0 0    Minutes  _2 Bike   Level  _3 Minutes  _4 Recumbant Bike   Level  _5 Watts  _6 NuStep   Level  _7 Watts  40 40 40    Minutes  _8 Arm Ergometer   Level  _9 Watts  _10 Minutes  _11 Arm/Foot Ergometer   Level  _12 Watts  _13 Minutes  _14 Cybex   Level  2 2 2  RPM  40 40 40    Minutes  _0 Recumbant Elliptical   Level  _1 Watts  _2 Minutes  _3 Elliptical   Level  _4 Speed  _5 Minutes  _6 REL-XR   Level  _7 Watts  40 40 40    Minutes  _8 T5 Nustep   Level  _9 Watts  _10 Minutes  _11 Biostep-RELP   Level  _12 Watts  40 40 40    Minutes  _13 Exercise Comments:     Exercise Comments      12/14/15 0916           Exercise Comments [facility] that have helped him recently since he felt the Nustep hurt his back a little back the other day. He liked the recumbent elliptical today.           Discharge Exercise Prescription (Final Exercise Prescription Changes):     Exercise Prescription Changes - 12/14/15 0900    Exercise Review   Progression Yes   Response to Exercise   Symptoms None   Comments Reviewed individualized exercise prescription and made increases per departmental policy. Exercise increases were discussed with the patient and they were able to perform the new  work loads without issue (no signs or symptoms). Patient's weight for the strength training portion was increased. All cardiac intensities remained the same today.    Duration Progress to 45 minutes of aerobic exercise without signs/symptoms of physical distress   Intensity Rest + 30   Progression   Progression Continue progressive overload as per policy without signs/symptoms or physical distress.   Resistance Training   Training Prescription Yes   Weight 4   Reps 10-15   Treadmill   MPH 3   Grade 0   Minutes 15   Bike   Level 1   Minutes 15   Recumbant Bike   Level 2   Watts 30   NuStep   Level 3   Watts 40   Minutes 15   Arm Ergometer   Level 1   Watts 8   Minutes 15   Arm/Foot Ergometer   Level 1   Watts 10   Minutes 15   Cybex   Level 2   RPM 40   Minutes 15   Recumbant Elliptical   Level 2   Watts 20   Minutes 15   Elliptical   Level 1   Speed 3   Minutes 5   REL-XR   Level 3   Watts 40   Minutes 15   T5 Nustep   Level 2   Watts 20   Minutes 15   Biostep-RELP   Level 3   Watts 40   Minutes 15      Nutrition:  Target Goals: Understanding of nutrition guidelines, daily intake of sodium <1579m, cholesterol <2059m calories 30% from fat and 7% or less from saturated fats, daily to have 5 or more servings of fruits and vegetables.  Biometrics:     Pre Biometrics - 11/19/15  1425    Pre Biometrics   Height _0  (1.753 m)   Weight 202 lb 9.6 oz (91.899 kg)   Waist Circumference 44 inches   Hip Circumference 43.5 inches   Waist to Hip Ratio 1.01 %   BMI (Calculated) 30       Nutrition Therapy Plan and Nutrition Goals:     Nutrition Therapy & Goals - 11/19/15 1311    Intervention Plan   Intervention Prescribe, educate and counsel regarding individualized specific dietary modifications aiming towards targeted core components such as weight, hypertension, lipid management, diabetes, heart failure and other comorbidities.   Expected  Outcomes Short Term Goal: Understand basic principles of dietary content, such as calories, fat, sodium, cholesterol and nutrients.;Short Term Goal: A plan has been developed with personal nutrition goals set during dietitian appointment.;Long Term Goal: Adherence to prescribed nutrition plan.      Nutrition Discharge: Rate Your Plate Scores:   Nutrition Goals Re-Evaluation:   Psychosocial: Target Goals: Acknowledge presence or absence of depression, maximize coping skills, provide positive support system. Participant is able to verbalize types and ability to use techniques and skills needed for reducing stress and depression.  Initial Review & Psychosocial Screening:     Initial Psych Review & Screening - 11/19/15 1320    Family Dynamics   Good Support System? Yes  "My wife is fantastic". Has a daughter that is available .   Barriers   Psychosocial barriers to participate in program There are no identifiable barriers or psychosocial needs.;The patient should benefit from training in stress management and relaxation.   Screening Interventions   Interventions Encouraged to exercise      Quality of Life Scores:     Quality of Life - 11/19/15 1440    Quality of Life Scores   Health/Function Pre 18.7 %   Socioeconomic Pre 25.58 %   Psych/Spiritual Pre 24.86 %   Family Pre 22.8 %   GLOBAL Pre 21.88 %      PHQ-9:     Recent Review Flowsheet Data    Depression screen [facility] 2/9 11/19/2015   Decreased Interest 0   Down, Depressed, Hopeless 0   PHQ - 2 Score 0   Altered sleeping 0   Tired, decreased energy 3   Change in appetite 3   Feeling bad or failure about yourself  0   Trouble concentrating 0   Moving slowly or fidgety/restless 0   Suicidal thoughts 0   PHQ-9 Score 6   Difficult doing work/chores Somewhat difficult       Psychosocial Evaluation and Intervention:     Psychosocial Evaluation - 11/21/15 0935    Psychosocial Evaluation & Interventions    Interventions Encouraged to exercise with the program and follow exercise prescription   Comments Counselor met with Mr. Yam today for initial psychosocial evaluation.  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  He has a strong support system with a spouse of 56 years and a daughter who lives close by.  He also is actively involved in his local church community.  Mr. Franta has other medical issues that impact his health, including severe osteoporsis and sleep apnea.  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  Mr. Catterton has a history of sleep apnea and states he uses a ventilator at night.  He has a good appetite and denies a history or current symptoms of depresion or anxiety.  Mr. Lipkin states he is  typically in a positive mood and has minimal stress in his life at this time.  His goals for this program are to breathe better and lose some weight.  He has a treadmill at home and plans to begin using that and join [employer] following completion of this program.        Psychosocial Re-Evaluation:   Vocational Rehabilitation: Provide vocational rehab assistance to qualifying candidates.   Vocational Rehab Evaluation & Intervention:     Vocational Rehab - 11/19/15 1332    Initial Vocational Rehab Evaluation & Intervention   Assessment shows need for Vocational Rehabilitation No      Education: Education Goals: Education classes will be provided on a weekly basis, covering required topics. Participant will state understanding/return demonstration of topics presented.  Learning Barriers/Preferences:     Learning Barriers/Preferences - 11/19/15 1333    Learning Barriers/Preferences   Learning Barriers Hearing   Learning Preferences None      Education Topics: General Nutrition Guidelines/Fats and Fiber: -Group instruction provided by verbal, written material, models and posters to present the general guidelines for heart healthy  nutrition. Gives an explanation and review of dietary fats and fiber.   Controlling Sodium/Reading Food Labels: -Group verbal and written material supporting the discussion of sodium use in heart healthy nutrition. Review and explanation with models, verbal and written materials for utilization of the food label.   Exercise Physiology & Risk Factors: - Group verbal and written instruction with models to review the exercise physiology of the cardiovascular system and associated critical values. Details cardiovascular disease risk factors and the goals associated with each risk factor.   Aerobic Exercise & Resistance Training: - Gives group verbal and written discussion on the health impact of inactivity. On the components of aerobic and resistive training programs and the benefits of this training and how to safely progress through these programs.          Cardiac Rehab from 12/26/2015 in [facility] Cardiac and Pulmonary Rehab   Date  11/21/15   Educator  SW   Instruction Review Code  2- meets goals/outcomes      Flexibility, Balance, General Exercise Guidelines: - Provides group verbal and written instruction on the benefits of flexibility and balance training programs. Provides general exercise guidelines with specific guidelines to those with heart or lung disease. Demonstration and skill practice provided.   Stress Management: - Provides group verbal and written instruction about the health risks of elevated stress, cause of high stress, and healthy ways to reduce stress.   Depression: - Provides group verbal and written instruction on the correlation between heart/lung disease and depressed mood, treatment options, and the stigmas associated with seeking treatment.      Cardiac Rehab from 12/26/2015 in [facility] Cardiac and Pulmonary Rehab   Date  12/26/15   Educator  Lucianne Lei, MSW   Instruction Review Code  2- meets goals/outcomes      Anatomy & Physiology of the Heart: - Group  verbal and written instruction and models provide basic cardiac anatomy and physiology, with the coronary electrical and arterial systems. Review of: AMI, Angina, Valve disease, Heart Failure, Cardiac Arrhythmia, Pacemakers, and the ICD.   Cardiac Procedures: - Group verbal and written instruction and models to describe the testing methods done to diagnose heart disease. Reviews the outcomes of the test results. Describes the treatment choices: Medical Management, Angioplasty, or Coronary Bypass Surgery.      Cardiac Rehab from 12/26/2015 in [facility] Cardiac and Pulmonary Rehab  Date  12/10/15   Educator  SB   Instruction Review Code  2- meets goals/outcomes      Cardiac Medications: - Group verbal and written instruction to review commonly prescribed medications for heart disease. Reviews the medication, class of the drug, and side effects. Includes the steps to properly store meds and maintain the prescription regimen.   Go Sex-Intimacy & Heart Disease, Get SMART - Goal Setting: - Group verbal and written instruction through game format to discuss heart disease and the return to sexual intimacy. Provides group verbal and written material to discuss and apply goal setting through the application of the S.M.A.R.T. Method.      Cardiac Rehab from 12/26/2015 in [facility] Cardiac and Pulmonary Rehab   Date  12/10/15   Educator  SB   Instruction Review Code  2- meets goals/outcomes      Other Matters of the Heart: - Provides group verbal, written materials and models to describe Heart Failure, Angina, Valve Disease, and Diabetes in the realm of heart disease. Includes description of the disease process and treatment options available to the cardiac patient.   Exercise & Equipment Safety: - Individual verbal instruction and demonstration of equipment use and safety with use of the equipment.      Cardiac Rehab from 12/26/2015 in [facility] Cardiac and Pulmonary Rehab   Date  11/19/15   Educator  SB    Instruction Review Code  2- meets goals/outcomes      Infection Prevention: - Provides verbal and written material to individual with discussion of infection control including proper hand washing and proper equipment cleaning during exercise session.      Cardiac Rehab from 12/26/2015 in [facility] Cardiac and Pulmonary Rehab   Date  11/19/15   Educator  Sb   Instruction Review Code  2- meets goals/outcomes      Falls Prevention: - Provides verbal and written material to individual with discussion of falls prevention and safety.      Cardiac Rehab from 12/26/2015 in [facility] Cardiac and Pulmonary Rehab   Date  11/19/15   Educator  SB   Instruction Review Code  2- meets goals/outcomes      Diabetes: - Individual verbal and written instruction to review signs/symptoms of diabetes, desired ranges of glucose level fasting, after meals and with exercise. Advice that pre and post exercise glucose checks will be done for 3 sessions at entry of program.    Knowledge Questionnaire Score:     Knowledge Questionnaire Score - 11/19/15 1330    Knowledge Questionnaire Score   Pre Score 24/28      Core Components/Risk Factors/Patient Goals at Admission:     Personal Goals and Risk Factors at Admission - 11/19/15 1313    Core Components/Risk Factors/Patient Goals on Admission    Weight Management Yes   Intervention Weight Management: Develop a combined nutrition and exercise program designed to reach desired caloric intake, while maintaining appropriate intake of nutrient and fiber, sodium and fats, and appropriate energy expenditure required for the weight goal.;Weight Management: Provide education and appropriate resources to help participant work on and attain dietary goals.;Weight Management/Obesity: Establish reasonable short term and long term weight goals.;Obesity: Provide education and appropriate resources to help participant work on and attain dietary goals.   Admit Weight 202 lb (91.627 kg)    Goal Weight: Short Term 190 lb (86.183 kg)   Goal Weight: Long Term 180 lb (81.647 kg)   Expected Outcomes Short Term: Continue to assess and modify interventions  until short term weight is achieved.;Long Term: Adherence to nutrition and physical activity/exercise program aimed toward attainment of established weight goal.   Sedentary Yes   Intervention Provide advice, education, support and counseling about physical activity/exercise needs.;Develop an individualized exercise prescription for aerobic and resistive training based on initial evaluation findings, risk stratification, comorbidities and participant's personal goals.   Expected Outcomes Achievement of increased cardiorespiratory fitness and enhanced flexibility, muscular endurance and strength shown through measurements of functional capaciy and personal statement of participant.   Lipids Yes  Currently taking niacin for low HDL. Reports his levels have increased to over 50 from in the 20's.   Intervention Provide education and support for participant on nutrition & aerobic/resistive exercise along with prescribed medications to achieve LDL <37m, HDL >433m   Expected Outcomes Short Term: Participant states understanding of desired cholesterol values and is compliant with medications prescribed. Participant is following exercise prescription and nutrition guidelines.;Long Term: Cholesterol controlled with medications as prescribed, with individualized exercise RX and with personalized nutrition plan. Value goals: LDL < 7037mHDL > 40 mg.      Core Components/Risk Factors/Patient Goals Review:      Goals and Risk Factor Review      12/10/15 1548           Core Components/Risk Factors/Patient Goals Review   Personal Goals Review Sedentary;Weight Management/Obesity;Lipids       Review Spoke with DewJavarriday. He has been here 5 sessions.  PLans to continue with the program and hopes to see progress with weight loss as he sontinues  with the exercise and nutrition plan he is providied. He is compliant with his meds and reports that his HDL is improved to greater than 50 from in the 20;s and that his bad cholseterol is low.        Expected Outcomes Continue with the exercise and nutrition plan to maintain risk factor control and working toward weight loss.          Core Components/Risk Factors/Patient Goals at Discharge (Final Review):      Goals and Risk Factor Review - 12/10/15 1548    Core Components/Risk Factors/Patient Goals Review   Personal Goals Review Sedentary;Weight Management/Obesity;Lipids   Review Spoke with DewBrocday. He has been here 5 sessions.  PLans to continue with the program and hopes to see progress with weight loss as he sontinues with the exercise and nutrition plan he is providied. He is compliant with his meds and reports that his HDL is improved to greater than 50 from in the 20;s and that his bad cholseterol is low.    Expected Outcomes Continue with the exercise and nutrition plan to maintain risk factor control and working toward weight loss.      ITP Comments:     ITP Comments      11/19/15 1310 11/21/15 0726         ITP Comments Orientation completed.  Initial ITP  Continue with ITP.     30 day review   Orientation is completed to start sessions this week   Continue with ITP         Comments:

## 2015-12-28 ENCOUNTER — Encounter: Payer: Medicare Other | Admitting: *Deleted

## 2015-12-28 DIAGNOSIS — I214 Non-ST elevation (NSTEMI) myocardial infarction: Secondary | ICD-10-CM

## 2015-12-28 NOTE — Progress Notes (Signed)
Cardiac Individual Treatment Plan  Patient Details  Name: Justin [name] MRN: [id number] Date of Birth: June 08, 1941 Referring Provider:  Isaias Cowman, MD  Initial Encounter Date:       Cardiac Rehab from 11/19/2015 in [facility] Cardiac and Pulmonary Rehab   Date  11/19/15      Visit Diagnosis: NSTEMI (non-ST elevated myocardial infarction) [facility])  Patient's Home Medications on Admission:  Current outpatient prescriptions:  .  acetaminophen (TYLENOL) 325 MG tablet, Take 650 mg by mouth 3 (three) times daily. , Disp: , Rfl:  .  aspirin EC 325 MG EC tablet, Take 1 tablet (325 mg total) by mouth daily., Disp: 30 tablet, Rfl: 0 .  atorvastatin (LIPITOR) 80 MG tablet, Take 40 mg by mouth at bedtime. , Disp: , Rfl:  .  Calcium Carb-Cholecalciferol (CALCIUM 600 + D PO), Take 1 tablet by mouth 2 (two) times daily. Take morning and at noon., Disp: , Rfl:  .  cholecalciferol (VITAMIN D) 1000 units tablet, Take 1,000 Units by mouth 3 (three) times daily., Disp: , Rfl:  .  clopidogrel (PLAVIX) 75 MG tablet, Take 1 tablet (75 mg total) by mouth daily., Disp: 30 tablet, Rfl: 0 .  fexofenadine (ALLEGRA) 180 MG tablet, Take 180 mg by mouth daily before breakfast. , Disp: , Rfl:  .  fluticasone (FLONASE) 50 MCG/ACT nasal spray, Place 2 sprays into both nostrils at bedtime., Disp: , Rfl:  .  isosorbide mononitrate (IMDUR) 60 MG 24 hr tablet, Take 60 mg by mouth daily., Disp: , Rfl:  .  LACTOBACILLUS PO, Take 2 tablets by mouth every morning. , Disp: , Rfl:  .  levofloxacin (LEVAQUIN) 750 MG tablet, Take 1 tablet (750 mg total) by mouth daily. (Patient not taking: Reported on 11/19/2015), Disp: 4 tablet, Rfl: 0 .  Magnesium 400 MG CAPS, Take 400 mg by mouth 2 (two) times daily. , Disp: , Rfl:  .  metoprolol (LOPRESSOR) 50 MG tablet, Take 50 mg by mouth 2 (two) times daily., Disp: , Rfl:  .  niacin 500 MG tablet, Take 500 mg by mouth 3 (three) times daily. Take one tablet in the morning, take one tablet in  the afternoon and take one tablet at night, Disp: , Rfl:  .  nitroGLYCERIN (NITROSTAT) 0.4 MG SL tablet, Place 0.4 mg under the tongue every 5 (five) minutes x 3 doses as needed for chest pain. If no relief call MD or go to emergency room., Disp: , Rfl:  .  Omega-3 Fatty Acids (FISH OIL) 1000 MG CAPS, Take 1,000 mg by mouth 2 (two) times daily., Disp: , Rfl:  .  omeprazole (PRILOSEC) 20 MG capsule, Take 20 mg by mouth 2 (two) times daily before a meal., Disp: , Rfl:  .  senna-docusate (SENOKOT-S) 8.6-50 MG per tablet, Take 2 tablets by mouth 2 (two) times daily. 4 TIMES DAILY, Disp: , Rfl:  .  traMADol (ULTRAM) 50 MG tablet, Take 50 mg by mouth 3 (three) times daily. , Disp: , Rfl:  .  vitamin C (ASCORBIC ACID) 500 MG tablet, Take 500 mg by mouth daily., Disp: , Rfl:  .  Zoledronic Acid (RECLAST IV), Inject into the vein. Injection once yearly, Disp: , Rfl:   Past Medical History: Past Medical History  Diagnosis Date  . Skin cancer   . History of open heart surgery   . CAD (coronary artery disease)   . Arthritis   . DJD (degenerative joint disease)     Tobacco Use: History  Smoking status  . Former Smoker  . Quit date: 06/26/1990  Smokeless tobacco  . Never Used    Labs: Recent Review Flowsheet Data    Labs for ITP Cardiac and Pulmonary Rehab Latest Ref Rng 11/04/2015   Cholestrol 0 - 200 mg/dL 98   LDLCALC 0 - 99 mg/dL 29   HDL >40 mg/dL 56   Trlycerides <150 mg/dL 64       Exercise Target Goals:    Exercise Program Goal: Individual exercise prescription set with THRR, safety & activity barriers. Participant demonstrates ability to understand and report RPE using BORG scale, to self-measure pulse accurately, and to acknowledge the importance of the exercise prescription.  Exercise Prescription Goal: Starting with aerobic activity 30 plus minutes a day, 3 days per week for initial exercise prescription. Provide home exercise prescription and guidelines that participant  acknowledges understanding prior to discharge.  Activity Barriers & Risk Stratification:     Activity Barriers & Cardiac Risk Stratification - 11/19/15 1328    Activity Barriers & Cardiac Risk Stratification   Activity Barriers Back Problems;Deconditioning  Bulging disc L2 and L3. Usually will not bother with exercise. Most bother when sitting for periods of time.    Cardiac Risk Stratification High      6 Minute Walk:     6 Minute Walk      11/19/15 1420       6 Minute Walk   Phase Initial     Distance 1300 feet     Walk Time 6 minutes     # of Rest Breaks 0     RPE 13     Symptoms Yes (comment)     Comments short of breath and leg weakness     Resting HR 60 bpm     Resting BP 112/72 mmHg     Max Ex. HR 84 bpm     Max Ex. BP 132/78 mmHg        Initial Exercise Prescription:     Initial Exercise Prescription - 11/19/15 1400    Date of Initial Exercise Prescription   Date 11/19/15   Treadmill   MPH 2   Grade 0   Minutes 15   Bike   Level 1   Minutes 15   Recumbant Bike   Level 2   Watts 30   NuStep   Level 3   Watts 40   Minutes 15   Arm Ergometer   Level 1   Watts 8   Minutes 15   Arm/Foot Ergometer   Level 1   Watts 10   Minutes 15   Cybex   Level 2   RPM 40   Minutes 15   Recumbant Elliptical   Level 2   Watts 20   Minutes 15   Elliptical   Level 1   Speed 3   Minutes 5   REL-XR   Level 3   Watts 40   Minutes 15   T5 Nustep   Level 2   Watts 20   Minutes 15   Biostep-RELP   Level 3   Watts 40   Minutes 15   Prescription Details   Frequency (times per week) 3   Duration Progress to 30 minutes of continuous aerobic without signs/symptoms of physical distress   Intensity   THRR REST +  30   Ratings of Perceived Exertion 11-15   Perceived Dyspnea 2-4   Progression   Progression Continue progressive overload as per policy without  signs/symptoms or physical distress.   Resistance Training   Training Prescription Yes    Weight 2   Reps 10-15      Perform Capillary Blood Glucose checks as needed.  Exercise Prescription Changes:     Exercise Prescription Changes      11/19/15 1400 12/03/15 1000 12/10/15 0800 12/14/15 0900     Exercise Review   Progression  Yes Yes Yes    Response to Exercise   Blood Pressure (Admit) 112/72 mmHg 130/70 mmHg      Blood Pressure (Exercise) 132/78 mmHg 148/70 mmHg      Blood Pressure (Exit) 122/70 mmHg 110/62 mmHg      Heart Rate (Admit) 60 bpm 65 bpm      Heart Rate (Exercise) 84 bpm 88 bpm      Heart Rate (Exit) 64 bpm 73 bpm      Rating of Perceived Exertion (Exercise) 13 13      Symptoms  None None None    Comments  Reviewed individualized exercise prescription and made increases per departmental policy. Exercise increases were discussed with the patient and they were able to perform the new work loads without issue (no signs or symptoms).  Reviewed individualized exercise prescription and made increases per departmental policy. Exercise increases were discussed with the patient and they were able to perform the new work loads without issue (no signs or symptoms). Patient's weight for the strength training portion was increased. All cardiac intensities remained the same today.  Reviewed individualized exercise prescription and made increases per departmental policy. Exercise increases were discussed with the patient and they were able to perform the new work loads without issue (no signs or symptoms). Patient's weight for the strength training portion was increased. All cardiac intensities remained the same today.     Duration  Progress to 45 minutes of aerobic exercise without signs/symptoms of physical distress Progress to 45 minutes of aerobic exercise without signs/symptoms of physical distress Progress to 45 minutes of aerobic exercise without signs/symptoms of physical distress    Intensity  Rest + 30 Rest + 30 Rest + 30    Progression   Progression  Continue  progressive overload as per policy without signs/symptoms or physical distress. Continue progressive overload as per policy without signs/symptoms or physical distress. Continue progressive overload as per policy without signs/symptoms or physical distress.    Resistance Training   Training Prescription  Yes Yes Yes    Weight  '2 3 4    '$ Reps  10-15 10-15 10-15    Treadmill   MPH  '3 3 3    '$ Grade  0 0 0    Minutes  '15 15 15    '$ Bike   Level  '1 1 1    '$ Minutes  '15 15 15    '$ Recumbant Bike   Level  '2 2 2    '$ Watts  '30 30 30    '$ NuStep   Level  '3 3 3    '$ Watts  40 40 40    Minutes  '15 15 15    '$ Arm Ergometer   Level  '1 1 1    '$ Watts  '8 8 8    '$ Minutes  '15 15 15    '$ Arm/Foot Ergometer   Level  '1 1 1    '$ Watts  '10 10 10    '$ Minutes  '15 15 15    '$ Cybex   Level  '2 2 2    '$ RPM  40 40  40    Minutes  '15 15 15    '$ Recumbant Elliptical   Level  '2 2 2    '$ Watts  '20 20 20    '$ Minutes  '15 15 15    '$ Elliptical   Level  '1 1 1    '$ Speed  '3 3 3    '$ Minutes  '5 5 5    '$ REL-XR   Level  '3 3 3    '$ Watts  40 40 40    Minutes  '15 15 15    '$ T5 Nustep   Level  '2 2 2    '$ Watts  '20 20 20    '$ Minutes  '15 15 15    '$ Biostep-RELP   Level  '3 3 3    '$ Watts  40 40 40    Minutes  '15 15 15       '$ Exercise Comments:     Exercise Comments      12/14/15 0916           Exercise Comments Yerik reports that the [place] gave him back exercises that have helped him recently since he felt the Nustep hurt his back a little back the other day. He liked the recumbent elliptical today.           Discharge Exercise Prescription (Final Exercise Prescription Changes):     Exercise Prescription Changes - 12/14/15 0900    Exercise Review   Progression Yes   Response to Exercise   Symptoms None   Comments Reviewed individualized exercise prescription and made increases per departmental policy. Exercise increases were discussed with the patient and they were able to perform the new work loads without issue (no signs or symptoms).  Patient's weight for the strength training portion was increased. All cardiac intensities remained the same today.    Duration Progress to 45 minutes of aerobic exercise without signs/symptoms of physical distress   Intensity Rest + 30   Progression   Progression Continue progressive overload as per policy without signs/symptoms or physical distress.   Resistance Training   Training Prescription Yes   Weight 4   Reps 10-15   Treadmill   MPH 3   Grade 0   Minutes 15   Bike   Level 1   Minutes 15   Recumbant Bike   Level 2   Watts 30   NuStep   Level 3   Watts 40   Minutes 15   Arm Ergometer   Level 1   Watts 8   Minutes 15   Arm/Foot Ergometer   Level 1   Watts 10   Minutes 15   Cybex   Level 2   RPM 40   Minutes 15   Recumbant Elliptical   Level 2   Watts 20   Minutes 15   Elliptical   Level 1   Speed 3   Minutes 5   REL-XR   Level 3   Watts 40   Minutes 15   T5 Nustep   Level 2   Watts 20   Minutes 15   Biostep-RELP   Level 3   Watts 40   Minutes 15      Nutrition:  Target Goals: Understanding of nutrition guidelines, daily intake of sodium '1500mg'$ , cholesterol '200mg'$ , calories 30% from fat and 7% or less from saturated fats, daily to have 5 or more servings of fruits and vegetables.  Biometrics:     Pre Biometrics - 11/19/15 1425  Pre Biometrics   Height '5\' 9"'$  (1.753 m)   Weight 202 lb 9.6 oz (91.899 kg)   Waist Circumference 44 inches   Hip Circumference 43.5 inches   Waist to Hip Ratio 1.01 %   BMI (Calculated) 30       Nutrition Therapy Plan and Nutrition Goals:     Nutrition Therapy & Goals - 11/19/15 1311    Intervention Plan   Intervention Prescribe, educate and counsel regarding individualized specific dietary modifications aiming towards targeted core components such as weight, hypertension, lipid management, diabetes, heart failure and other comorbidities.   Expected Outcomes Short Term Goal: Understand basic principles of  dietary content, such as calories, fat, sodium, cholesterol and nutrients.;Short Term Goal: A plan has been developed with personal nutrition goals set during dietitian appointment.;Long Term Goal: Adherence to prescribed nutrition plan.      Nutrition Discharge: Rate Your Plate Scores:   Nutrition Goals Re-Evaluation:   Psychosocial: Target Goals: Acknowledge presence or absence of depression, maximize coping skills, provide positive support system. Participant is able to verbalize types and ability to use techniques and skills needed for reducing stress and depression.  Initial Review & Psychosocial Screening:     Initial Psych Review & Screening - 11/19/15 1320    Family Dynamics   Good Support System? Yes  "My wife is fantastic". Has a daughter that is available .   Barriers   Psychosocial barriers to participate in program There are no identifiable barriers or psychosocial needs.;The patient should benefit from training in stress management and relaxation.   Screening Interventions   Interventions Encouraged to exercise      Quality of Life Scores:     Quality of Life - 11/19/15 1440    Quality of Life Scores   Health/Function Pre 18.7 %   Socioeconomic Pre 25.58 %   Psych/Spiritual Pre 24.86 %   Family Pre 22.8 %   GLOBAL Pre 21.88 %      PHQ-9:     Recent Review Flowsheet Data    Depression screen [facility] 2/9 11/19/2015   Decreased Interest 0   Down, Depressed, Hopeless 0   PHQ - 2 Score 0   Altered sleeping 0   Tired, decreased energy 3   Change in appetite 3   Feeling bad or failure about yourself  0   Trouble concentrating 0   Moving slowly or fidgety/restless 0   Suicidal thoughts 0   PHQ-9 Score 6   Difficult doing work/chores Somewhat difficult       Psychosocial Evaluation and Intervention:     Psychosocial Evaluation - 11/21/15 0935    Psychosocial Evaluation & Interventions   Interventions Encouraged to exercise with the program and follow  exercise prescription   Comments Counselor met with Mr. Farra today for initial psychosocial evaluation.  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  He has a strong support system with a spouse of 2 years and a daughter who lives close by.  He also is actively involved in his local church community.  Mr. Busler has other medical issues that impact his health, including severe osteoporsis and sleep apnea.  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  Mr. Plack has a history of sleep apnea and states he uses a ventilator at night.  He has a good appetite and denies a history or current symptoms of depresion or anxiety.  Mr. Tieu states he is typically in a positive  mood and has minimal stress in his life at this time.  His goals for this program are to breathe better and lose some weight.  He has a treadmill at home and plans to begin using that and join [employer] following completion of this program.        Psychosocial Re-Evaluation:     Psychosocial Re-Evaluation      12/28/15 0910           Psychosocial Re-Evaluation   Interventions Encouraged to attend Cardiac Rehabilitation for the exercise       Comments Mr. Kali said his brother has moved in with him afer his brother was in the CCU the same time Rue was for his heart attack. His brother went to phyiscal therapy rehab then came to live with them with home health due to his CHF. Gave info to give to his brother about attending CR after home health is done.           Vocational Rehabilitation: Provide vocational rehab assistance to qualifying candidates.   Vocational Rehab Evaluation & Intervention:     Vocational Rehab - 11/19/15 1332    Initial Vocational Rehab Evaluation & Intervention   Assessment shows need for Vocational Rehabilitation No      Education: Education Goals: Education classes will be provided on a weekly basis, covering required topics. Participant will  state understanding/return demonstration of topics presented.  Learning Barriers/Preferences:     Learning Barriers/Preferences - 11/19/15 1333    Learning Barriers/Preferences   Learning Barriers Hearing   Learning Preferences None      Education Topics: General Nutrition Guidelines/Fats and Fiber: -Group instruction provided by verbal, written material, models and posters to present the general guidelines for heart healthy nutrition. Gives an explanation and review of dietary fats and fiber.   Controlling Sodium/Reading Food Labels: -Group verbal and written material supporting the discussion of sodium use in heart healthy nutrition. Review and explanation with models, verbal and written materials for utilization of the food label.   Exercise Physiology & Risk Factors: - Group verbal and written instruction with models to review the exercise physiology of the cardiovascular system and associated critical values. Details cardiovascular disease risk factors and the goals associated with each risk factor.   Aerobic Exercise & Resistance Training: - Gives group verbal and written discussion on the health impact of inactivity. On the components of aerobic and resistive training programs and the benefits of this training and how to safely progress through these programs.          Cardiac Rehab from 12/26/2015 in [facility] Cardiac and Pulmonary Rehab   Date  11/21/15   Educator  SW   Instruction Review Code  2- meets goals/outcomes      Flexibility, Balance, General Exercise Guidelines: - Provides group verbal and written instruction on the benefits of flexibility and balance training programs. Provides general exercise guidelines with specific guidelines to those with heart or lung disease. Demonstration and skill practice provided.   Stress Management: - Provides group verbal and written instruction about the health risks of elevated stress, cause of high stress, and healthy ways to  reduce stress.   Depression: - Provides group verbal and written instruction on the correlation between heart/lung disease and depressed mood, treatment options, and the stigmas associated with seeking treatment.      Cardiac Rehab from 12/26/2015 in [facility] Cardiac and Pulmonary Rehab   Date  12/26/15   Educator  Lucianne Lei, MSW   Instruction  Review Code  2- meets goals/outcomes      Anatomy & Physiology of the Heart: - Group verbal and written instruction and models provide basic cardiac anatomy and physiology, with the coronary electrical and arterial systems. Review of: AMI, Angina, Valve disease, Heart Failure, Cardiac Arrhythmia, Pacemakers, and the ICD.   Cardiac Procedures: - Group verbal and written instruction and models to describe the testing methods done to diagnose heart disease. Reviews the outcomes of the test results. Describes the treatment choices: Medical Management, Angioplasty, or Coronary Bypass Surgery.      Cardiac Rehab from 12/26/2015 in [facility] Cardiac and Pulmonary Rehab   Date  12/10/15   Educator  SB   Instruction Review Code  2- meets goals/outcomes      Cardiac Medications: - Group verbal and written instruction to review commonly prescribed medications for heart disease. Reviews the medication, class of the drug, and side effects. Includes the steps to properly store meds and maintain the prescription regimen.   Go Sex-Intimacy & Heart Disease, Get SMART - Goal Setting: - Group verbal and written instruction through game format to discuss heart disease and the return to sexual intimacy. Provides group verbal and written material to discuss and apply goal setting through the application of the S.M.A.R.T. Method.      Cardiac Rehab from 12/26/2015 in [facility] Cardiac and Pulmonary Rehab   Date  12/10/15   Educator  SB   Instruction Review Code  2- meets goals/outcomes      Other Matters of the Heart: - Provides group verbal, written materials and models to  describe Heart Failure, Angina, Valve Disease, and Diabetes in the realm of heart disease. Includes description of the disease process and treatment options available to the cardiac patient.   Exercise & Equipment Safety: - Individual verbal instruction and demonstration of equipment use and safety with use of the equipment.      Cardiac Rehab from 12/26/2015 in [facility] Cardiac and Pulmonary Rehab   Date  11/19/15   Educator  SB   Instruction Review Code  2- meets goals/outcomes      Infection Prevention: - Provides verbal and written material to individual with discussion of infection control including proper hand washing and proper equipment cleaning during exercise session.      Cardiac Rehab from 12/26/2015 in [facility] Cardiac and Pulmonary Rehab   Date  11/19/15   Educator  Sb   Instruction Review Code  2- meets goals/outcomes      Falls Prevention: - Provides verbal and written material to individual with discussion of falls prevention and safety.      Cardiac Rehab from 12/26/2015 in [facility] Cardiac and Pulmonary Rehab   Date  11/19/15   Educator  SB   Instruction Review Code  2- meets goals/outcomes      Diabetes: - Individual verbal and written instruction to review signs/symptoms of diabetes, desired ranges of glucose level fasting, after meals and with exercise. Advice that pre and post exercise glucose checks will be done for 3 sessions at entry of program.    Knowledge Questionnaire Score:     Knowledge Questionnaire Score - 11/19/15 1330    Knowledge Questionnaire Score   Pre Score 24/28      Core Components/Risk Factors/Patient Goals at Admission:     Personal Goals and Risk Factors at Admission - 11/19/15 1313    Core Components/Risk Factors/Patient Goals on Admission    Weight Management Yes   Intervention Weight Management: Develop a combined nutrition  and exercise program designed to reach desired caloric intake, while maintaining appropriate intake of nutrient  and fiber, sodium and fats, and appropriate energy expenditure required for the weight goal.;Weight Management: Provide education and appropriate resources to help participant work on and attain dietary goals.;Weight Management/Obesity: Establish reasonable short term and long term weight goals.;Obesity: Provide education and appropriate resources to help participant work on and attain dietary goals.   Admit Weight 202 lb (91.627 kg)   Goal Weight: Short Term 190 lb (86.183 kg)   Goal Weight: Long Term 180 lb (81.647 kg)   Expected Outcomes Short Term: Continue to assess and modify interventions until short term weight is achieved.;Long Term: Adherence to nutrition and physical activity/exercise program aimed toward attainment of established weight goal.   Sedentary Yes   Intervention Provide advice, education, support and counseling about physical activity/exercise needs.;Develop an individualized exercise prescription for aerobic and resistive training based on initial evaluation findings, risk stratification, comorbidities and participant's personal goals.   Expected Outcomes Achievement of increased cardiorespiratory fitness and enhanced flexibility, muscular endurance and strength shown through measurements of functional capaciy and personal statement of participant.   Lipids Yes  Currently taking niacin for low HDL. Reports his levels have increased to over 50 from in the 20's.   Intervention Provide education and support for participant on nutrition & aerobic/resistive exercise along with prescribed medications to achieve LDL '70mg'$ , HDL >'40mg'$ .   Expected Outcomes Short Term: Participant states understanding of desired cholesterol values and is compliant with medications prescribed. Participant is following exercise prescription and nutrition guidelines.;Long Term: Cholesterol controlled with medications as prescribed, with individualized exercise RX and with personalized nutrition plan. Value  goals: LDL < '70mg'$ , HDL > 40 mg.      Core Components/Risk Factors/Patient Goals Review:      Goals and Risk Factor Review      12/10/15 1548 12/28/15 0905         Core Components/Risk Factors/Patient Goals Review   Personal Goals Review Sedentary;Weight Management/Obesity;Lipids Sedentary;Weight Management/Obesity;Lipids      Review Spoke with Vilas today. He has been here 5 sessions.  PLans to continue with the program and hopes to see progress with weight loss as he sontinues with the exercise and nutrition plan he is providied. He is compliant with his meds and reports that his HDL is improved to greater than 50 from in the 20;s and that his bad cholseterol is low.  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 He is meeing with the Cardiac Rehab registered dietician soon. He said he lost weight in the past by subsituting a Slim Fast for a meal. He needs to eat something every 2 hours since he gets low blood sugars but he knows when his blood sugar drops. He carries peanut butter crackers. I encoured him to carry a piece of candy or some sugar.       Expected Outcomes Continue with the exercise and nutrition plan to maintain risk factor control and working toward weight loss. Tyrian wants to get to 180lbs.          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 He is meeing with  the Cardiac Rehab registered dietician soon. He said he lost weight in the past by subsituting a Slim Fast for a meal. He needs to eat something every 2 hours since he gets low blood sugars but he knows when his blood sugar drops. He carries peanut butter  crackers. I encoured him to carry a piece of candy or some sugar.    Expected Outcomes Jaquel wants to get to 180lbs.       ITP Comments:     ITP Comments      11/19/15 1310 11/21/15 0726         ITP Comments Orientation completed.  Initial ITP  Continue with ITP.     30 day review   Orientation is completed to start sessions this week   Continue with ITP         Comments:

## 2015-12-28 NOTE — Progress Notes (Signed)
Daily Session Note  Patient Details  Name: LAMONT TANT MRN: [id number] Date of Birth: 1941/06/19 Referring Provider:  Isaias Cowman, MD  Encounter Date: 12/28/2015  Check In:     Session Check In - 12/28/15 0904    Check-In   Location [facility]   Staff Present Heath Lark, RN, BSN, CCRP;Pablo Stauffer, RN, Michaela Corner, RRT, RCP, Respiratory Therapist   Supervising physician immediately available to respond to emergencies See telemetry face sheet for immediately available ER MD   Medication changes reported     No   Fall or balance concerns reported    No   Warm-up and Cool-down Performed on first and last piece of equipment   Resistance Training Performed Yes   VAD Patient? No   Pain Assessment   Currently in Pain? No/denies         Goals Met:  Proper associated with RPD/PD & O2 Sat Independence with exercise equipment Exercise tolerated well Personal goals reviewed No report of cardiac concerns or symptoms Strength training completed today  Goals Unmet:  Not Applicable  Comments:    Dr. [name] is Medical Director for [place] and [facility].

## 2015-12-31 ENCOUNTER — Encounter: Payer: Medicare Other | Admitting: *Deleted

## 2015-12-31 DIAGNOSIS — I214 Non-ST elevation (NSTEMI) myocardial infarction: Secondary | ICD-10-CM

## 2015-12-31 NOTE — Progress Notes (Signed)
Daily Session Note  Patient Details  Name: NIKOLAJ GERAGHTY MRN: [id number] Date of Birth: Apr 05, 1941 Referring Provider:  Isaias Cowman, MD  Encounter Date: 12/31/2015  Check In:     Session Check In - 12/31/15 0829    Check-In   Resistance Training Performed Yes   VAD Patient? No   Pain Assessment   Currently in Pain? No/denies         Goals Met:  Proper associated with RPD/PD & O2 Sat Exercise tolerated well No report of cardiac concerns or symptoms  Goals Unmet:  Not Applicable  Comments:    Dr. [name] is Medical Director for [place] and [facility].

## 2016-01-02 ENCOUNTER — Encounter: Payer: Medicare Other | Admitting: *Deleted

## 2016-01-02 DIAGNOSIS — I214 Non-ST elevation (NSTEMI) myocardial infarction: Secondary | ICD-10-CM | POA: Diagnosis not present

## 2016-01-02 DIAGNOSIS — Z9861 Coronary angioplasty status: Secondary | ICD-10-CM

## 2016-01-02 NOTE — Progress Notes (Signed)
Daily Session Note  Patient Details  Name: Justin [name] MRN: [id number] Date of Birth: [birth year] Referring Provider:  Isaias Cowman, MD  Encounter Date: 01/02/2016  Check In:     Session Check In - 01/02/16 0852    Check-In   Location [facility]   Staff Present Gerlene Burdock, RN, BSN;Mary Kellie Shropshire, RN;Rebecca Sickles, DPT, CEEA   Medication changes reported     No   Fall or balance concerns reported    No   Warm-up and Cool-down Performed on first and last piece of equipment   Resistance Training Performed Yes   VAD Patient? No   Pain Assessment   Currently in Pain? No/denies         Goals Met:  Proper associated with RPD/PD & O2 Sat Exercise tolerated well  Goals Unmet:  Not Applicable  Comments:    Dr. [name] is Medical Director for [place] and [facility].

## 2016-01-04 ENCOUNTER — Encounter: Payer: Medicare Other | Admitting: *Deleted

## 2016-01-04 ENCOUNTER — Encounter: Payer: Self-pay | Admitting: *Deleted

## 2016-01-04 DIAGNOSIS — I214 Non-ST elevation (NSTEMI) myocardial infarction: Secondary | ICD-10-CM

## 2016-01-04 DIAGNOSIS — Z9861 Coronary angioplasty status: Secondary | ICD-10-CM

## 2016-01-04 NOTE — Progress Notes (Signed)
Daily Session Note  Patient Details  Name: Justin [name] MRN: [id number] Date of Birth: [birth year] Referring Provider:    Encounter Date: 01/04/2016  Check In:     Session Check In - 01/04/16 0913    Check-In   Staff Present Heath Lark, RN, BSN, CCRP;Carroll Enterkin, RN, Alex Gardener, DPT, [place] physician immediately available to respond to emergencies See telemetry face sheet for immediately available ER MD   Medication changes reported     No   Fall or balance concerns reported    No   Warm-up and Cool-down Performed on first and last piece of equipment   VAD Patient? No   Pain Assessment   Currently in Pain? No/denies         Goals Met:  Independence with exercise equipment Exercise tolerated well No report of cardiac concerns or symptoms Strength training completed today  Goals Unmet:  Not Applicable  Comments: Doing well with exercise prescription progression.    Dr. [name] is Medical Director for [place] [facility].

## 2016-01-04 NOTE — Progress Notes (Unsigned)
Cardiac Individual Treatment Plan  Patient Details  Name: Justin [name] MRN: [id number] Date of Birth: Jun 15, 1941 Referring Provider:    Initial Encounter Date:       Cardiac Rehab from 11/19/2015 in [facility] Cardiac and Pulmonary Rehab   Date  11/19/15      Visit Diagnosis: No diagnosis found.  Patient's Home Medications on Admission:  Current outpatient prescriptions:  .  acetaminophen (TYLENOL) 325 MG tablet, Take 650 mg by mouth 3 (three) times daily. , Disp: , Rfl:  .  aspirin EC 325 MG EC tablet, Take 1 tablet (325 mg total) by mouth daily., Disp: 30 tablet, Rfl: 0 .  atorvastatin (LIPITOR) 80 MG tablet, Take 40 mg by mouth at bedtime. , Disp: , Rfl:  .  Calcium Carb-Cholecalciferol (CALCIUM 600 + D PO), Take 1 tablet by mouth 2 (two) times daily. Take morning and at noon., Disp: , Rfl:  .  cholecalciferol (VITAMIN D) 1000 units tablet, Take 1,000 Units by mouth 3 (three) times daily., Disp: , Rfl:  .  clopidogrel (PLAVIX) 75 MG tablet, Take 1 tablet (75 mg total) by mouth daily., Disp: 30 tablet, Rfl: 0 .  fexofenadine (ALLEGRA) 180 MG tablet, Take 180 mg by mouth daily before breakfast. , Disp: , Rfl:  .  fluticasone (FLONASE) 50 MCG/ACT nasal spray, Place 2 sprays into both nostrils at bedtime., Disp: , Rfl:  .  isosorbide mononitrate (IMDUR) 60 MG 24 hr tablet, Take 60 mg by mouth daily., Disp: , Rfl:  .  LACTOBACILLUS PO, Take 2 tablets by mouth every morning. , Disp: , Rfl:  .  levofloxacin (LEVAQUIN) 750 MG tablet, Take 1 tablet (750 mg total) by mouth daily. (Patient not taking: Reported on 11/19/2015), Disp: 4 tablet, Rfl: 0 .  Magnesium 400 MG CAPS, Take 400 mg by mouth 2 (two) times daily. , Disp: , Rfl:  .  metoprolol (LOPRESSOR) 50 MG tablet, Take 50 mg by mouth 2 (two) times daily., Disp: , Rfl:  .  niacin 500 MG tablet, Take 500 mg by mouth 3 (three) times daily. Take one tablet in the morning, take one tablet in the afternoon and take one tablet at night, Disp: , Rfl:   .  nitroGLYCERIN (NITROSTAT) 0.4 MG SL tablet, Place 0.4 mg under the tongue every 5 (five) minutes x 3 doses as needed for chest pain. If no relief call MD or go to emergency room., Disp: , Rfl:  .  Omega-3 Fatty Acids (FISH OIL) 1000 MG CAPS, Take 1,000 mg by mouth 2 (two) times daily., Disp: , Rfl:  .  omeprazole (PRILOSEC) 20 MG capsule, Take 20 mg by mouth 2 (two) times daily before a meal., Disp: , Rfl:  .  senna-docusate (SENOKOT-S) 8.6-50 MG per tablet, Take 2 tablets by mouth 2 (two) times daily. 4 TIMES DAILY, Disp: , Rfl:  .  traMADol (ULTRAM) 50 MG tablet, Take 50 mg by mouth 3 (three) times daily. , Disp: , Rfl:  .  vitamin C (ASCORBIC ACID) 500 MG tablet, Take 500 mg by mouth daily., Disp: , Rfl:  .  Zoledronic Acid (RECLAST IV), Inject into the vein. Injection once yearly, Disp: , Rfl:   Past Medical History: Past Medical History  Diagnosis Date  . Skin cancer   . History of open heart surgery   . CAD (coronary artery disease)   . Arthritis   . DJD (degenerative joint disease)     Tobacco Use: History  Smoking status  . Former  Smoker  . Quit date: 06/26/1990  Smokeless tobacco  . Never Used    Labs: Recent Review Flowsheet Data    Labs for ITP Cardiac and Pulmonary Rehab Latest Ref Rng 11/04/2015   Cholestrol 0 - 200 mg/dL 98   LDLCALC 0 - 99 mg/dL 29   HDL >40 mg/dL 56   Trlycerides <150 mg/dL 64       Exercise Target Goals:    Exercise Program Goal: Individual exercise prescription set with THRR, safety & activity barriers. Participant demonstrates ability to understand and report RPE using BORG scale, to self-measure pulse accurately, and to acknowledge the importance of the exercise prescription.  Exercise Prescription Goal: Starting with aerobic activity 30 plus minutes a day, 3 days per week for initial exercise prescription. Provide home exercise prescription and guidelines that participant acknowledges understanding prior to discharge.  Activity  Barriers & Risk Stratification:     Activity Barriers & Cardiac Risk Stratification - 11/19/15 1328    Activity Barriers & Cardiac Risk Stratification   Activity Barriers Back Problems;Deconditioning  Bulging disc L2 and L3. Usually will not bother with exercise. Most bother when sitting for periods of time.    Cardiac Risk Stratification High      6 Minute Walk:     6 Minute Walk      11/19/15 1420       6 Minute Walk   Phase Initial     Distance 1300 feet     Walk Time 6 minutes     # of Rest Breaks 0     RPE 13     Symptoms Yes (comment)     Comments short of breath and leg weakness     Resting HR 60 bpm     Resting BP 112/72 mmHg     Max Ex. HR 84 bpm     Max Ex. BP 132/78 mmHg        Initial Exercise Prescription:     Initial Exercise Prescription - 11/19/15 1400    Date of Initial Exercise RX and Referring Provider   Date 11/19/15   Treadmill   MPH 2   Grade 0   Minutes 15   Bike   Level 1   Minutes 15   Recumbant Bike   Level 2   Watts 30   NuStep   Level 3   Watts 40   Minutes 15   Arm Ergometer   Level 1   Watts 8   Minutes 15   Arm/Foot Ergometer   Level 1   Watts 10   Minutes 15   Cybex   Level 2   RPM 40   Minutes 15   Recumbant Elliptical   Level 2   Watts 20   Minutes 15   Elliptical   Level 1   Speed 3   Minutes 5   REL-XR   Level 3   Watts 40   Minutes 15   T5 Nustep   Level 2   Watts 20   Minutes 15   Biostep-RELP   Level 3   Watts 40   Minutes 15   Prescription Details   Frequency (times per week) 3   Duration Progress to 30 minutes of continuous aerobic without signs/symptoms of physical distress   Intensity   THRR REST +  30   Ratings of Perceived Exertion 11-15   Perceived Dyspnea 2-4   Progression   Progression Continue progressive overload as per policy without signs/symptoms or  physical distress.   Resistance Training   Training Prescription Yes   Weight 2   Reps 10-15      Perform Capillary  Blood Glucose checks as needed.  Exercise Prescription Changes:     Exercise Prescription Changes      11/19/15 1400 12/03/15 1000 12/10/15 0800 12/14/15 0900 12/31/15 0800   Exercise Review   Progression  Yes Yes Yes Yes   Response to Exercise   Blood Pressure (Admit) 112/72 mmHg 130/70 mmHg   120/68 mmHg   Blood Pressure (Exercise) 132/78 mmHg 148/70 mmHg   148/82 mmHg   Blood Pressure (Exit) 122/70 mmHg 110/62 mmHg   116/72 mmHg   Heart Rate (Admit) 60 bpm 65 bpm   85 bpm   Heart Rate (Exercise) 84 bpm 88 bpm   112 bpm   Heart Rate (Exit) 64 bpm 73 bpm   71 bpm   Rating of Perceived Exertion (Exercise) '13 13   14   '$ Symptoms  None None None None   Comments  Reviewed individualized exercise prescription and made increases per departmental policy. Exercise increases were discussed with the patient and they were able to perform the new work loads without issue (no signs or symptoms).  Reviewed individualized exercise prescription and made increases per departmental policy. Exercise increases were discussed with the patient and they were able to perform the new work loads without issue (no signs or symptoms). Patient's weight for the strength training portion was increased. All cardiac intensities remained the same today.  Reviewed individualized exercise prescription and made increases per departmental policy. Exercise increases were discussed with the patient and they were able to perform the new work loads without issue (no signs or symptoms). Patient's weight for the strength training portion was increased. All cardiac intensities remained the same today.  Reveiwed individual exercise prescription with patient. He tolerated it well with no signs or symptoms.    Duration  Progress to 45 minutes of aerobic exercise without signs/symptoms of physical distress Progress to 45 minutes of aerobic exercise without signs/symptoms of physical distress Progress to 45 minutes of aerobic exercise without  signs/symptoms of physical distress Progress to 45 minutes of aerobic exercise without signs/symptoms of physical distress   Intensity  Rest + 30 Rest + 30 Rest + 30 Rest + 30   Progression   Progression  Continue progressive overload as per policy without signs/symptoms or physical distress. Continue progressive overload as per policy without signs/symptoms or physical distress. Continue progressive overload as per policy without signs/symptoms or physical distress. Continue progressive overload as per policy without signs/symptoms or physical distress.   Resistance Training   Training Prescription  Yes Yes Yes Yes   Weight  '2 3 4 4   '$ Reps  10-15 10-15 10-15 10-15   Interval Training   Interval Training     Yes   Equipment     REL-XR   Comments     Interval Training was recommended to the patient to begin with 2 min x 30 second high intensity intervals on the XR. 30 second interval should be done at a 15 RPE.    Treadmill   MPH  '3 3 3 '$ 3.4   Grade  0 0 0 2   Minutes  '15 15 15 25   '$ Bike   Level  '1 1 1 '$ --   Minutes  '15 15 15 '$ --   Recumbant Bike   Level  '2 2 2 '$ --   Watts  30 30  30 --   NuStep   Level  '3 3 3 '$ --   Watts  40 40 40 --   Minutes  '15 15 15 '$ --   Arm Ergometer   Level  '1 1 1 '$ --   Watts  '8 8 8 '$ --   Minutes  '15 15 15 '$ --   Arm/Foot Ergometer   Level  '1 1 1 '$ --   Watts  '10 10 10 '$ --   Minutes  '15 15 15 '$ --   Cybex   Level  '2 2 2 '$ --   RPM  40 40 40 --   Minutes  '15 15 15 '$ --   Recumbant Elliptical   Level  '2 2 2 '$ --   Watts  '20 20 20 '$ --   Minutes  '15 15 15 '$ --   Elliptical   Level  '1 1 1 '$ --   Speed  '3 3 3 '$ --   Minutes  '5 5 5 '$ --   REL-XR   Level  '3 3 3 10   '$ Watts  40 40 40 124   Minutes  '15 15 15 20   '$ T5 Nustep   Level  '2 2 2 '$ --   Watts  '20 20 20 '$ --   Minutes  '15 15 15 '$ --   Biostep-RELP   Level  '3 3 3 '$ --   Watts  40 40 40 --   Minutes  '15 15 15 '$ --      Exercise Comments:     Exercise Comments      12/14/15 0916 12/31/15 1154         Exercise Comments  [name] that have helped him recently since he felt the Nustep hurt his back a little back the other day. He liked the recumbent elliptical today.  Justin [name] stated that he has noticed an increase in energy in doing daily activities. He has been able to do some yard work this week without symptoms. He enjoys woodworking and building and has been able to get back to doing that sometimes.          Discharge Exercise Prescription (Final Exercise Prescription Changes):     Exercise Prescription Changes - 12/31/15 0800    Exercise Review   Progression Yes   Response to Exercise   Blood Pressure (Admit) 120/68 mmHg   Blood Pressure (Exercise) 148/82 mmHg   Blood Pressure (Exit) 116/72 mmHg   Heart Rate (Admit) 85 bpm   Heart Rate (Exercise) 112 bpm   Heart Rate (Exit) 71 bpm   Rating of Perceived Exertion (Exercise) 14   Symptoms None   Comments Reveiwed individual exercise prescription with patient. He tolerated it well with no signs or symptoms.    Duration Progress to 45 minutes of aerobic exercise without signs/symptoms of physical distress   Intensity Rest + 30   Progression   Progression Continue progressive overload as per policy without signs/symptoms or physical distress.   Resistance Training   Training Prescription Yes   Weight 4   Reps 10-15   Interval Training   Interval Training Yes   Equipment REL-XR   Comments Interval Training was recommended to the patient to begin with 2 min x 30 second high intensity intervals on the XR. 30 second interval should be done at a 15 RPE.    Treadmill   MPH 3.4   Grade 2   Minutes 25   Bike   Level --  Minutes --   Recumbant Bike   Level --   Watts --   NuStep   Level --   Watts --   Minutes --   Arm Ergometer   Level --   Watts --   Minutes --   Arm/Foot Ergometer   Level --   Watts --   Minutes --   Cybex   Level --   RPM --   Minutes --   Recumbant Elliptical   Level --    Watts --   Minutes --   Elliptical   Level --   Speed --   Minutes --   REL-XR   Level 10   Watts 124   Minutes 20   T5 Nustep   Level --   Watts --   Minutes --   Biostep-RELP   Level --   Watts --   Minutes --      Nutrition:  Target Goals: Understanding of nutrition guidelines, daily intake of sodium '1500mg'$ , cholesterol '200mg'$ , calories 30% from fat and 7% or less from saturated fats, daily to have 5 or more servings of fruits and vegetables.  Biometrics:     Pre Biometrics - 11/19/15 1425    Pre Biometrics   Height '5\' 9"'$  (1.753 m)   Weight 202 lb 9.6 oz (91.899 kg)   Waist Circumference 44 inches   Hip Circumference 43.5 inches   Waist to Hip Ratio 1.01 %   BMI (Calculated) 30       Nutrition Therapy Plan and Nutrition Goals:     Nutrition Therapy & Goals - 11/19/15 1311    Intervention Plan   Intervention Prescribe, educate and counsel regarding individualized specific dietary modifications aiming towards targeted core components such as weight, hypertension, lipid management, diabetes, heart failure and other comorbidities.   Expected Outcomes Short Term Goal: Understand basic principles of dietary content, such as calories, fat, sodium, cholesterol and nutrients.;Short Term Goal: A plan has been developed with personal nutrition goals set during dietitian appointment.;Long Term Goal: Adherence to prescribed nutrition plan.      Nutrition Discharge: Rate Your Plate Scores:   Nutrition Goals Re-Evaluation:     Nutrition Goals Re-Evaluation      12/28/15 0913           Personal Goal #1 Re-Evaluation   Personal Goal #1 Trying to lose weight. Has low blood sugars so needs to eat small meals or something every 2 hours.        Goal Progress Seen Yes          Psychosocial: Target Goals: Acknowledge presence or absence of depression, maximize coping skills, provide positive support system. Participant is able to verbalize types and ability to use  techniques and skills needed for reducing stress and depression.  Initial Review & Psychosocial Screening:     Initial Psych Review & Screening - 11/19/15 1320    Family Dynamics   Good Support System? Yes  "My wife is fantastic". Has a daughter that is available .   Barriers   Psychosocial barriers to participate in program There are no identifiable barriers or psychosocial needs.;The patient should benefit from training in stress management and relaxation.   Screening Interventions   Interventions Encouraged to exercise      Quality of Life Scores:     Quality of Life - 11/19/15 1440    Quality of Life Scores   Health/Function Pre 18.7 %   Socioeconomic Pre 25.58 %   Psych/Spiritual Pre 24.86 %  Family Pre 22.8 %   GLOBAL Pre 21.88 %      PHQ-9:     Recent Review Flowsheet Data    Depression screen [facility] 2/9 11/19/2015   Decreased Interest 0   Down, Depressed, Hopeless 0   PHQ - 2 Score 0   Altered sleeping 0   Tired, decreased energy 3   Change in appetite 3   Feeling bad or failure about yourself  0   Trouble concentrating 0   Moving slowly or fidgety/restless 0   Suicidal thoughts 0   PHQ-9 Score 6   Difficult doing work/chores Somewhat difficult       Psychosocial Evaluation and Intervention:     Psychosocial Evaluation - 11/21/15 0935    Psychosocial Evaluation & Interventions   Interventions Encouraged to exercise with the program and follow exercise prescription   Comments Counselor met with Justin [name] today for initial psychosocial evaluation.  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  He has a strong support system with a spouse of 55 years and a daughter who lives close by.  He also is actively involved in his local church community.  Justin [name] has other medical issues that impact his health, including severe osteoporsis and sleep apnea.  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  Justin [name] has a  history of sleep apnea and states he uses a ventilator at night.  He has a good appetite and denies a history or current symptoms of depresion or anxiety.  Justin [name] states he is typically in a positive mood and has minimal stress in his life at this time.  His goals for this program are to breathe better and lose some weight.  He has a treadmill at home and plans to begin using that and join [employer] following completion of this program.        Psychosocial Re-Evaluation:     Psychosocial Re-Evaluation      12/28/15 0910 12/31/15 0942         Psychosocial Re-Evaluation   Interventions Encouraged to attend Cardiac Rehabilitation for the exercise       Comments Justin [name] said his brother has moved in with him afer his brother was in the CCU the same time Justin [name] was for his heart attack. His brother went to phyiscal therapy rehab then came to live with them with home health due to his CHF. Gave info to give to his brother about attending CR after home health is done.  Counselor follow up with Justin [name] today reporting feeling stronger, breathing better and sleeping better - other than his back hurting - since he came into this program.  Counselor and Justin [name] discussed his options once he completes this program with Silver Sneakers being an option for consistent exercise.  Justin [name] continues to maintain a positive mood.  Counselor commended him for all of his hard work and progress already made in this program.           Vocational Rehabilitation: Provide vocational rehab assistance to qualifying candidates.   Vocational Rehab Evaluation & Intervention:     Vocational Rehab - 11/19/15 1332    Initial Vocational Rehab Evaluation & Intervention   Assessment shows need for Vocational Rehabilitation No      Education: Education Goals: Education classes will be provided on a weekly basis, covering required topics. Participant will state understanding/return demonstration of topics  presented.  Learning Barriers/Preferences:  Learning Barriers/Preferences - 11/19/15 1333    Learning Barriers/Preferences   Learning Barriers Hearing   Learning Preferences None      Education Topics: General Nutrition Guidelines/Fats and Fiber: -Group instruction provided by verbal, written material, models and posters to present the general guidelines for heart healthy nutrition. Gives an explanation and review of dietary fats and fiber.   Controlling Sodium/Reading Food Labels: -Group verbal and written material supporting the discussion of sodium use in heart healthy nutrition. Review and explanation with models, verbal and written materials for utilization of the food label.          Cardiac Rehab from 12/31/2015 in [facility] Cardiac and Pulmonary Rehab   Date  12/31/15   Educator  C. Joneen Caraway, RD   Instruction Review Code  2- meets goals/outcomes      Exercise Physiology & Risk Factors: - Group verbal and written instruction with models to review the exercise physiology of the cardiovascular system and associated critical values. Details cardiovascular disease risk factors and the goals associated with each risk factor.   Aerobic Exercise & Resistance Training: - Gives group verbal and written discussion on the health impact of inactivity. On the components of aerobic and resistive training programs and the benefits of this training and how to safely progress through these programs.      Cardiac Rehab from 12/31/2015 in [facility] Cardiac and Pulmonary Rehab   Date  11/21/15   Educator  SW   Instruction Review Code  2- meets goals/outcomes      Flexibility, Balance, General Exercise Guidelines: - Provides group verbal and written instruction on the benefits of flexibility and balance training programs. Provides general exercise guidelines with specific guidelines to those with heart or lung disease. Demonstration and skill practice provided.   Stress Management: - Provides  group verbal and written instruction about the health risks of elevated stress, cause of high stress, and healthy ways to reduce stress.   Depression: - Provides group verbal and written instruction on the correlation between heart/lung disease and depressed mood, treatment options, and the stigmas associated with seeking treatment.      Cardiac Rehab from 12/31/2015 in [facility] Cardiac and Pulmonary Rehab   Date  12/26/15   Educator  Lucianne Lei, MSW   Instruction Review Code  2- meets goals/outcomes      Anatomy & Physiology of the Heart: - Group verbal and written instruction and models provide basic cardiac anatomy and physiology, with the coronary electrical and arterial systems. Review of: AMI, Angina, Valve disease, Heart Failure, Cardiac Arrhythmia, Pacemakers, and the ICD.   Cardiac Procedures: - Group verbal and written instruction and models to describe the testing methods done to diagnose heart disease. Reviews the outcomes of the test results. Describes the treatment choices: Medical Management, Angioplasty, or Coronary Bypass Surgery.      Cardiac Rehab from 12/31/2015 in [facility] Cardiac and Pulmonary Rehab   Date  12/10/15   Educator  SB   Instruction Review Code  2- meets goals/outcomes      Cardiac Medications: - Group verbal and written instruction to review commonly prescribed medications for heart disease. Reviews the medication, class of the drug, and side effects. Includes the steps to properly store meds and maintain the prescription regimen.   Go Sex-Intimacy & Heart Disease, Get SMART - Goal Setting: - Group verbal and written instruction through game format to discuss heart disease and the return to sexual intimacy. Provides group verbal and written material to discuss and apply  goal setting through the application of the S.M.A.R.T. Method.      Cardiac Rehab from 12/31/2015 in [facility] Cardiac and Pulmonary Rehab   Date  12/10/15   Educator  SB   Instruction Review  Code  2- meets goals/outcomes      Other Matters of the Heart: - Provides group verbal, written materials and models to describe Heart Failure, Angina, Valve Disease, and Diabetes in the realm of heart disease. Includes description of the disease process and treatment options available to the cardiac patient.   Exercise & Equipment Safety: - Individual verbal instruction and demonstration of equipment use and safety with use of the equipment.      Cardiac Rehab from 12/31/2015 in [facility] Cardiac and Pulmonary Rehab   Date  11/19/15   Educator  SB   Instruction Review Code  2- meets goals/outcomes      Infection Prevention: - Provides verbal and written material to individual with discussion of infection control including proper hand washing and proper equipment cleaning during exercise session.      Cardiac Rehab from 12/31/2015 in [facility] Cardiac and Pulmonary Rehab   Date  11/19/15   Educator  Sb   Instruction Review Code  2- meets goals/outcomes      Falls Prevention: - Provides verbal and written material to individual with discussion of falls prevention and safety.      Cardiac Rehab from 12/31/2015 in [facility] Cardiac and Pulmonary Rehab   Date  11/19/15   Educator  SB   Instruction Review Code  2- meets goals/outcomes      Diabetes: - Individual verbal and written instruction to review signs/symptoms of diabetes, desired ranges of glucose level fasting, after meals and with exercise. Advice that pre and post exercise glucose checks will be done for 3 sessions at entry of program.    Knowledge Questionnaire Score:     Knowledge Questionnaire Score - 11/19/15 1330    Knowledge Questionnaire Score   Pre Score 24/28      Core Components/Risk Factors/Patient Goals at Admission:     Personal Goals and Risk Factors at Admission - 11/19/15 1313    Core Components/Risk Factors/Patient Goals on Admission    Weight Management Yes   Intervention Weight Management: Develop a  combined nutrition and exercise program designed to reach desired caloric intake, while maintaining appropriate intake of nutrient and fiber, sodium and fats, and appropriate energy expenditure required for the weight goal.;Weight Management: Provide education and appropriate resources to help participant work on and attain dietary goals.;Weight Management/Obesity: Establish reasonable short term and long term weight goals.;Obesity: Provide education and appropriate resources to help participant work on and attain dietary goals.   Admit Weight 202 lb (91.627 kg)   Goal Weight: Short Term 190 lb (86.183 kg)   Goal Weight: Long Term 180 lb (81.647 kg)   Expected Outcomes Short Term: Continue to assess and modify interventions until short term weight is achieved.;Long Term: Adherence to nutrition and physical activity/exercise program aimed toward attainment of established weight goal.   Sedentary Yes   Intervention Provide advice, education, support and counseling about physical activity/exercise needs.;Develop an individualized exercise prescription for aerobic and resistive training based on initial evaluation findings, risk stratification, comorbidities and participant's personal goals.   Expected Outcomes Achievement of increased cardiorespiratory fitness and enhanced flexibility, muscular endurance and strength shown through measurements of functional capaciy and personal statement of participant.   Lipids Yes  Currently taking niacin for low HDL. Reports his levels have  increased to over 50 from in the 20's.   Intervention Provide education and support for participant on nutrition & aerobic/resistive exercise along with prescribed medications to achieve LDL '70mg'$ , HDL >'40mg'$ .   Expected Outcomes Short Term: Participant states understanding of desired cholesterol values and is compliant with medications prescribed. Participant is following exercise prescription and nutrition guidelines.;Long Term:  Cholesterol controlled with medications as prescribed, with individualized exercise RX and with personalized nutrition plan. Value goals: LDL < '70mg'$ , HDL > 40 mg.      Core Components/Risk Factors/Patient Goals Review:      Goals and Risk Factor Review      12/10/15 1548 12/28/15 0905         Core Components/Risk Factors/Patient Goals Review   Personal Goals Review Sedentary;Weight Management/Obesity;Lipids Sedentary;Weight Management/Obesity;Lipids      Review Spoke with Justin [name] today. He has been here 5 sessions.  PLans to continue with the program and hopes to see progress with weight loss as he sontinues with the exercise and nutrition plan he is providied. He is compliant with his meds and reports that his HDL is improved to greater than 50 from in the 20;s and that his bad cholseterol is low.  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 He is meeing with the Cardiac Rehab registered dietician soon. He said he lost weight in the past by subsituting a Slim Fast for a meal. He needs to eat something every 2 hours since he gets low blood sugars but he knows when his blood sugar drops. He carries peanut butter crackers. I encoured him to carry a piece of candy or some sugar.       Expected Outcomes Continue with the exercise and nutrition plan to maintain risk factor control and working toward weight loss. Justin [name] wants to get to 180lbs.          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 He is meeing with the Cardiac Rehab registered dietician soon. He said he lost weight in the past by subsituting a Slim Fast for a meal. He needs to  eat something every 2 hours since he gets low blood sugars but he knows when his blood sugar drops. He carries peanut butter crackers. I encoured him to carry a piece of candy or some sugar.    Expected Outcomes Justin [name] wants to get to 180lbs.       ITP Comments:     ITP Comments      11/19/15 1310 11/21/15 0726 01/04/16 1406       ITP Comments Orientation completed.  Initial ITP  Continue with ITP.     30 day review   Orientation is completed to start sessions this week   Continue with ITP 30 day review        Comments:

## 2016-01-07 ENCOUNTER — Encounter: Payer: Medicare Other | Admitting: *Deleted

## 2016-01-07 DIAGNOSIS — I214 Non-ST elevation (NSTEMI) myocardial infarction: Secondary | ICD-10-CM

## 2016-01-07 DIAGNOSIS — Z9861 Coronary angioplasty status: Secondary | ICD-10-CM

## 2016-01-07 NOTE — Progress Notes (Signed)
Daily Session Note  Patient Details  Name: Justin [name] MRN: [id number] Date of Birth: 28-Feb-1941 Referring Provider:    Encounter Date: 01/07/2016  Check In:     Session Check In - 01/07/16 0849    Check-In   Location [facility]   Staff Present Gerlene Burdock, RN, BSN;Susanne Bice, RN, BSN, Laveda Norman, BS, ACSM CEP, Exercise Physiologist   Supervising physician immediately available to respond to emergencies See telemetry face sheet for immediately available ER MD   Medication changes reported     No   Fall or balance concerns reported    No   Warm-up and Cool-down Performed on first and last piece of equipment   Resistance Training Performed Yes   VAD Patient? No   Pain Assessment   Currently in Pain? No/denies   Multiple Pain Sites No         Goals Met:  Independence with exercise equipment Exercise tolerated well No report of cardiac concerns or symptoms Strength training completed today  Goals Unmet:  Not Applicable  Comments: Patient completed exercise prescription and all exercise goals during rehab session. The exercise was tolerated well and the patient is progressing in the program.     Dr. [name] is Medical Director for [place] [facility].

## 2016-01-09 ENCOUNTER — Encounter: Payer: Medicare Other | Admitting: *Deleted

## 2016-01-09 DIAGNOSIS — I214 Non-ST elevation (NSTEMI) myocardial infarction: Secondary | ICD-10-CM

## 2016-01-09 DIAGNOSIS — Z9861 Coronary angioplasty status: Secondary | ICD-10-CM

## 2016-01-09 NOTE — Progress Notes (Signed)
Daily Session Note  Patient Details  Name: MONTEZ CUDA MRN: [id number] Date of Birth: 08/10/1941 Referring Provider:    Encounter Date: 01/09/2016  Check In:     Session Check In - 01/09/16 0837    Check-In   Staff Present Heath Lark, RN, BSN, CCRP;Carroll Enterkin, RN, Alex Gardener, DPT, CEEA   Supervising physician immediately available to respond to emergencies See telemetry face sheet for immediately available ER MD   Medication changes reported     No   Fall or balance concerns reported    No   Warm-up and Cool-down Performed on first and last piece of equipment   VAD Patient? No   Pain Assessment   Currently in Pain? No/denies         Goals Met:  Independence with exercise equipment Exercise tolerated well No report of cardiac concerns or symptoms  Goals Unmet:  Not Applicable  Comments: Doing well with exercise prescription progression.    Dr. [name] is Medical Director for [place] and [facility].

## 2016-01-11 ENCOUNTER — Encounter: Payer: Medicare Other | Admitting: *Deleted

## 2016-01-11 DIAGNOSIS — I214 Non-ST elevation (NSTEMI) myocardial infarction: Secondary | ICD-10-CM

## 2016-01-11 DIAGNOSIS — Z9861 Coronary angioplasty status: Secondary | ICD-10-CM

## 2016-01-11 NOTE — Progress Notes (Signed)
Daily Session Note  Patient Details  Name: ARISTOTLE LIEB MRN: [id number] Date of Birth: 06-Aug-1941 Referring Provider:    Encounter Date: 01/11/2016  Check In:     Session Check In - 01/11/16 0914    Check-In   Staff Present Heath Lark, RN, BSN, CCRP;Carroll Enterkin, RN, Alex Gardener, DPT, CEEA   Supervising physician immediately available to respond to emergencies See telemetry face sheet for immediately available ER MD   Medication changes reported     No   Fall or balance concerns reported    No   Warm-up and Cool-down Performed on first and last piece of equipment   VAD Patient? No   Pain Assessment   Currently in Pain? No/denies         Goals Met:  Independence with exercise equipment Exercise tolerated well No report of cardiac concerns or symptoms Strength training completed today  Goals Unmet:  Not Applicable  Comments: Doing well with exercise prescription progression.    Dr. [name] is Medical Director for [place] and [facility].

## 2016-01-13 ENCOUNTER — Encounter: Payer: Self-pay | Admitting: *Deleted

## 2016-01-13 DIAGNOSIS — Z9861 Coronary angioplasty status: Secondary | ICD-10-CM

## 2016-01-13 DIAGNOSIS — I214 Non-ST elevation (NSTEMI) myocardial infarction: Secondary | ICD-10-CM

## 2016-01-13 NOTE — Progress Notes (Signed)
Cardiac Individual Treatment Plan  Patient Details  Name: Justin [name] MRN: [id number] Date of Birth: Oct 01, 1940 Referring Provider:    Initial Encounter Date:       Cardiac Rehab from 11/19/2015 in [facility] Cardiac and Pulmonary Rehab   Date  11/19/15      Visit Diagnosis: NSTEMI (non-ST elevated myocardial infarction) ([place])  S/P PTCA (percutaneous transluminal coronary angioplasty)  Patient's Home Medications on Admission:  Current outpatient prescriptions:  .  acetaminophen (TYLENOL) 325 MG tablet, Take 650 mg by mouth 3 (three) times daily. , Disp: , Rfl:  .  aspirin EC 325 MG EC tablet, Take 1 tablet (325 mg total) by mouth daily., Disp: 30 tablet, Rfl: 0 .  atorvastatin (LIPITOR) 80 MG tablet, Take 40 mg by mouth at bedtime. , Disp: , Rfl:  .  Calcium Carb-Cholecalciferol (CALCIUM 600 + D PO), Take 1 tablet by mouth 2 (two) times daily. Take morning and at noon., Disp: , Rfl:  .  cholecalciferol (VITAMIN D) 1000 units tablet, Take 1,000 Units by mouth 3 (three) times daily., Disp: , Rfl:  .  clopidogrel (PLAVIX) 75 MG tablet, Take 1 tablet (75 mg total) by mouth daily., Disp: 30 tablet, Rfl: 0 .  fexofenadine (ALLEGRA) 180 MG tablet, Take 180 mg by mouth daily before breakfast. , Disp: , Rfl:  .  fluticasone (FLONASE) 50 MCG/ACT nasal spray, Place 2 sprays into both nostrils at bedtime., Disp: , Rfl:  .  isosorbide mononitrate (IMDUR) 60 MG 24 hr tablet, Take 60 mg by mouth daily., Disp: , Rfl:  .  LACTOBACILLUS PO, Take 2 tablets by mouth every morning. , Disp: , Rfl:  .  levofloxacin (LEVAQUIN) 750 MG tablet, Take 1 tablet (750 mg total) by mouth daily. (Patient not taking: Reported on 11/19/2015), Disp: 4 tablet, Rfl: 0 .  Magnesium 400 MG CAPS, Take 400 mg by mouth 2 (two) times daily. , Disp: , Rfl:  .  metoprolol (LOPRESSOR) 50 MG tablet, Take 50 mg by mouth 2 (two) times daily., Disp: , Rfl:  .  niacin 500 MG tablet, Take 500 mg by mouth 3 (three) times daily. Take one tablet  in the morning, take one tablet in the afternoon and take one tablet at night, Disp: , Rfl:  .  nitroGLYCERIN (NITROSTAT) 0.4 MG SL tablet, Place 0.4 mg under the tongue every 5 (five) minutes x 3 doses as needed for chest pain. If no relief call MD or go to emergency room., Disp: , Rfl:  .  Omega-3 Fatty Acids (FISH OIL) 1000 MG CAPS, Take 1,000 mg by mouth 2 (two) times daily., Disp: , Rfl:  .  omeprazole (PRILOSEC) 20 MG capsule, Take 20 mg by mouth 2 (two) times daily before a meal., Disp: , Rfl:  .  senna-docusate (SENOKOT-S) 8.6-50 MG per tablet, Take 2 tablets by mouth 2 (two) times daily. 4 TIMES DAILY, Disp: , Rfl:  .  traMADol (ULTRAM) 50 MG tablet, Take 50 mg by mouth 3 (three) times daily. , Disp: , Rfl:  .  vitamin C (ASCORBIC ACID) 500 MG tablet, Take 500 mg by mouth daily., Disp: , Rfl:  .  Zoledronic Acid (RECLAST IV), Inject into the vein. Injection once yearly, Disp: , Rfl:   Past Medical History: Past Medical History  Diagnosis Date  . Skin cancer   . History of open heart surgery   . CAD (coronary artery disease)   . Arthritis   . DJD (degenerative joint disease)  Tobacco Use: History  Smoking status  . Former Smoker  . Quit date: 06/26/1990  Smokeless tobacco  . Never Used    Labs: Recent Review Flowsheet Data    Labs for ITP Cardiac and Pulmonary Rehab Latest Ref Rng 11/04/2015   Cholestrol 0 - 200 mg/dL 98   LDLCALC 0 - 99 mg/dL 29   HDL >40 mg/dL 56   Trlycerides <150 mg/dL 64       Exercise Target Goals:    Exercise Program Goal: Individual exercise prescription set with THRR, safety & activity barriers. Participant demonstrates ability to understand and report RPE using BORG scale, to self-measure pulse accurately, and to acknowledge the importance of the exercise prescription.  Exercise Prescription Goal: Starting with aerobic activity 30 plus minutes a day, 3 days per week for initial exercise prescription. Provide home exercise prescription  and guidelines that participant acknowledges understanding prior to discharge.  Activity Barriers & Risk Stratification:     Activity Barriers & Cardiac Risk Stratification - 11/19/15 1328    Activity Barriers & Cardiac Risk Stratification   Activity Barriers Back Problems;Deconditioning  Bulging disc L2 and L3. Usually will not bother with exercise. Most bother when sitting for periods of time.    Cardiac Risk Stratification High      6 Minute Walk:     6 Minute Walk      11/19/15 1420       6 Minute Walk   Phase Initial     Distance 1300 feet     Walk Time 6 minutes     # of Rest Breaks 0     RPE 13     Symptoms Yes (comment)     Comments short of breath and leg weakness     Resting HR 60 bpm     Resting BP 112/72 mmHg     Max Ex. HR 84 bpm     Max Ex. BP 132/78 mmHg        Initial Exercise Prescription:     Initial Exercise Prescription - 11/19/15 1400    Date of Initial Exercise RX and Referring Provider   Date 11/19/15   Treadmill   MPH 2   Grade 0   Minutes 15   Bike   Level 1   Minutes 15   Recumbant Bike   Level 2   Watts 30   NuStep   Level 3   Watts 40   Minutes 15   Arm Ergometer   Level 1   Watts 8   Minutes 15   Arm/Foot Ergometer   Level 1   Watts 10   Minutes 15   Cybex   Level 2   RPM 40   Minutes 15   Recumbant Elliptical   Level 2   Watts 20   Minutes 15   Elliptical   Level 1   Speed 3   Minutes 5   REL-XR   Level 3   Watts 40   Minutes 15   T5 Nustep   Level 2   Watts 20   Minutes 15   Biostep-RELP   Level 3   Watts 40   Minutes 15   Prescription Details   Frequency (times per week) 3   Duration Progress to 30 minutes of continuous aerobic without signs/symptoms of physical distress   Intensity   THRR REST +  30   Ratings of Perceived Exertion 11-15   Perceived Dyspnea 2-4   Progression   Progression  Continue progressive overload as per policy without signs/symptoms or physical distress.   Resistance  Training   Training Prescription Yes   Weight 2   Reps 10-15      Perform Capillary Blood Glucose checks as needed.  Exercise Prescription Changes:     Exercise Prescription Changes      11/19/15 1400 12/03/15 1000 12/10/15 0800 12/14/15 0900 12/31/15 0800   Exercise Review   Progression  Yes Yes Yes Yes   Response to Exercise   Blood Pressure (Admit) 112/72 mmHg 130/70 mmHg   120/68 mmHg   Blood Pressure (Exercise) 132/78 mmHg 148/70 mmHg   148/82 mmHg   Blood Pressure (Exit) 122/70 mmHg 110/62 mmHg   116/72 mmHg   Heart Rate (Admit) 60 bpm 65 bpm   85 bpm   Heart Rate (Exercise) 84 bpm 88 bpm   112 bpm   Heart Rate (Exit) 64 bpm 73 bpm   71 bpm   Rating of Perceived Exertion (Exercise) _0 Symptoms  None None None None   Comments  Reviewed individualized exercise prescription and made increases per departmental policy. Exercise increases were discussed with the patient and they were able to perform the new work loads without issue (no signs or symptoms).  Reviewed individualized exercise prescription and made increases per departmental policy. Exercise increases were discussed with the patient and they were able to perform the new work loads without issue (no signs or symptoms). Patient's weight for the strength training portion was increased. All cardiac intensities remained the same today.  Reviewed individualized exercise prescription and made increases per departmental policy. Exercise increases were discussed with the patient and they were able to perform the new work loads without issue (no signs or symptoms). Patient's weight for the strength training portion was increased. All cardiac intensities remained the same today.  Reveiwed individual exercise prescription with patient. He tolerated it well with no signs or symptoms.    Duration  Progress to 45 minutes of aerobic exercise without signs/symptoms of physical distress Progress to 45 minutes of aerobic exercise without  signs/symptoms of physical distress Progress to 45 minutes of aerobic exercise without signs/symptoms of physical distress Progress to 45 minutes of aerobic exercise without signs/symptoms of physical distress   Intensity  Rest + 30 Rest + 30 Rest + 30 Rest + 30   Progression   Progression  Continue progressive overload as per policy without signs/symptoms or physical distress. Continue progressive overload as per policy without signs/symptoms or physical distress. Continue progressive overload as per policy without signs/symptoms or physical distress. Continue progressive overload as per policy without signs/symptoms or physical distress.   Resistance Training   Training Prescription  Yes Yes Yes Yes   Weight  _1 Reps  10-15 10-15 10-15 10-15   Interval Training   Interval Training     Yes   Equipment     REL-XR   Comments     Interval Training was recommended to the patient to begin with 2 min x 30 second high intensity intervals on the XR. 30 second interval should be done at a 15 RPE.    Treadmill   MPH  _2 3.4   Grade  0 0 0 2   Minutes  _3 Bike   Level  _4 --   Minutes  _5 --   Recumbant Bike   Level  2  2 2 --   Watts  _0 --   NuStep   Level  _1 --   Watts  40 40 40 --   Minutes  _2 --   Arm Ergometer   Level  _3 --   Watts  _4 --   Minutes  _5 --   Arm/Foot Ergometer   Level  _6 --   Watts  _7 --   Minutes  _8 --   Cybex   Level  _9 --   RPM  40 40 40 --   Minutes  _10 --   Recumbant Elliptical   Level  _11 --   Watts  _12 --   Minutes  _13 --   Elliptical   Level  _14 --   Speed  _15 --   Minutes  _16 --   REL-XR   Level  _17 Watts  40 40 40 124   Minutes  _18 T5 Nustep   Level  _19 --   Watts  _20 --   Minutes  _21 --   Biostep-RELP   Level  _22 --   Watts  40 40 40 --   Minutes  _23 --      Exercise Comments:      Exercise Comments      12/14/15 0916 12/31/15 1154         Exercise Comments [facility] that have helped him recently since he felt the Nustep hurt his back a little back the other day. He liked the recumbent elliptical today.  Karmelo stated that he has noticed an increase in energy in doing daily activities. He has been able to do some yard work this week without symptoms. He enjoys woodworking and building and has been able to get back to doing that sometimes.          Discharge Exercise Prescription (Final Exercise Prescription Changes):     Exercise Prescription Changes - 12/31/15 0800    Exercise Review   Progression Yes   Response to Exercise   Blood Pressure (Admit) 120/68 mmHg   Blood Pressure (Exercise) 148/82 mmHg   Blood Pressure (Exit) 116/72 mmHg   Heart Rate (Admit) 85 bpm   Heart Rate (Exercise) 112 bpm   Heart Rate (Exit) 71 bpm   Rating of Perceived Exertion (Exercise) 14   Symptoms None   Comments Reveiwed individual exercise prescription with patient. He tolerated it well with no signs or symptoms.    Duration Progress to 45 minutes of aerobic exercise without signs/symptoms of physical distress   Intensity Rest + 30   Progression   Progression Continue progressive overload as per policy without signs/symptoms or physical distress.   Resistance Training   Training Prescription Yes   Weight 4   Reps 10-15   Interval Training   Interval Training Yes   Equipment REL-XR   Comments Interval Training was recommended to the patient to begin with 2 min x 30 second high intensity intervals on the XR. 30 second interval should be done at a 15 RPE.    Treadmill   MPH 3.4   Grade 2   Minutes 25  Bike   Level --   Minutes --   Recumbant Bike   Level --   Watts --   NuStep   Level --   Watts --   Minutes --   Arm Ergometer   Level --   Watts --   Minutes --   Arm/Foot Ergometer   Level --   Watts --   Minutes --    Cybex   Level --   RPM --   Minutes --   Recumbant Elliptical   Level --   Watts --   Minutes --   Elliptical   Level --   Speed --   Minutes --   REL-XR   Level 10   Watts 124   Minutes 20   T5 Nustep   Level --   Watts --   Minutes --   Biostep-RELP   Level --   Watts --   Minutes --      Nutrition:  Target Goals: Understanding of nutrition guidelines, daily intake of sodium '1500mg'$ , cholesterol '200mg'$ , calories 30% from fat and 7% or less from saturated fats, daily to have 5 or more servings of fruits and vegetables.  Biometrics:     Pre Biometrics - 11/19/15 1425    Pre Biometrics   Height '5\' 9"'$  (1.753 m)   Weight 202 lb 9.6 oz (91.899 kg)   Waist Circumference 44 inches   Hip Circumference 43.5 inches   Waist to Hip Ratio 1.01 %   BMI (Calculated) 30       Nutrition Therapy Plan and Nutrition Goals:     Nutrition Therapy & Goals - 01/08/16 1652    Nutrition Therapy   Diet Heart Healthy dietary guidelines based on 1700 calories   Drug/Food Interactions Statins/Certain Fruits   Protein (specify units) 7   Fiber 30 grams   Whole Grain Foods 3 servings   Saturated Fats 12 max. grams   Fruits and Vegetables 5 servings/day   Sodium 1500 grams   Personal Nutrition Goals   Personal Goal #1 Include more whole grains through 100% whole wheat bread, oatmeal, 1/2 cup brown rice, corn, popcorn   Personal Goal #2 Include at least 7 oz.of a protein food. Refer to list   Personal Goal #3 Include a protein food at breakfast to help avoid low blood sugar.   Personal Goal #4 Add 1-2 servings of fruit per day.   Intervention Plan   Intervention Prescribe, educate and counsel regarding individualized specific dietary modifications aiming towards targeted core components such as weight, hypertension, lipid management, diabetes, heart failure and other comorbidities.;Nutrition handout(s) given to patient.   Expected Outcomes Short Term Goal: A plan has been developed  with personal nutrition goals set during dietitian appointment.;Short Term Goal: Understand basic principles of dietary content, such as calories, fat, sodium, cholesterol and nutrients.;Long Term Goal: Adherence to prescribed nutrition plan.      Nutrition Discharge: Rate Your Plate Scores:     Nutrition Assessments - 01/08/16 1655    Rate Your Plate Scores   Pre Score 63   Pre Score % 70 %      Nutrition Goals Re-Evaluation:     Nutrition Goals Re-Evaluation      12/28/15 0913           Personal Goal #1 Re-Evaluation   Personal Goal #1 Trying to lose weight. Has low blood sugars so needs to eat small meals or something every 2 hours.  Goal Progress Seen Yes          Psychosocial: Target Goals: Acknowledge presence or absence of depression, maximize coping skills, provide positive support system. Participant is able to verbalize types and ability to use techniques and skills needed for reducing stress and depression.  Initial Review & Psychosocial Screening:     Initial Psych Review & Screening - 11/19/15 1320    Family Dynamics   Good Support System? Yes  "My wife is fantastic". Has a daughter that is available .   Barriers   Psychosocial barriers to participate in program There are no identifiable barriers or psychosocial needs.;The patient should benefit from training in stress management and relaxation.   Screening Interventions   Interventions Encouraged to exercise      Quality of Life Scores:     Quality of Life - 11/19/15 1440    Quality of Life Scores   Health/Function Pre 18.7 %   Socioeconomic Pre 25.58 %   Psych/Spiritual Pre 24.86 %   Family Pre 22.8 %   GLOBAL Pre 21.88 %      PHQ-9:     Recent Review Flowsheet Data    Depression screen [facility] 2/9 11/19/2015   Decreased Interest 0   Down, Depressed, Hopeless 0   PHQ - 2 Score 0   Altered sleeping 0   Tired, decreased energy 3   Change in appetite 3   Feeling bad or failure about  yourself  0   Trouble concentrating 0   Moving slowly or fidgety/restless 0   Suicidal thoughts 0   PHQ-9 Score 6   Difficult doing work/chores Somewhat difficult       Psychosocial Evaluation and Intervention:     Psychosocial Evaluation - 11/21/15 0935    Psychosocial Evaluation & Interventions   Interventions Encouraged to exercise with the program and follow exercise prescription   Comments Counselor met with Mr. Antos today for initial psychosocial evaluation.  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  He has a strong support system with a spouse of 50 years and a daughter who lives close by.  He also is actively involved in his local church community.  Mr. Pohle has other medical issues that impact his health, including severe osteoporsis and sleep apnea.  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  Mr. Mayabb has a history of sleep apnea and states he uses a ventilator at night.  He has a good appetite and denies a history or current symptoms of depresion or anxiety.  Mr. Nicholson states he is typically in a positive mood and has minimal stress in his life at this time.  His goals for this program are to breathe better and lose some weight.  He has a treadmill at home and plans to begin using that and join [employer] following completion of this program.        Psychosocial Re-Evaluation:     Psychosocial Re-Evaluation      12/28/15 0910 12/31/15 0942         Psychosocial Re-Evaluation   Interventions Encouraged to attend Cardiac Rehabilitation for the exercise       Comments Mr. Junah said his brother has moved in with him afer his brother was in the CCU the same time Jorje was for his heart attack. His brother went to phyiscal therapy rehab then came to live with them with home health due to his CHF. Gave info to give  to his brother about attending CR after home health is done.  Counselor follow up with Mr. Wakefield today  reporting feeling stronger, breathing better and sleeping better - other than his back hurting - since he came into this program.  Counselor and Mr. Hauschild discussed his options once he completes this program with Silver Sneakers being an option for consistent exercise.  Mr. Brendle continues to maintain a positive mood.  Counselor commended him for all of his hard work and progress already made in this program.           Vocational Rehabilitation: Provide vocational rehab assistance to qualifying candidates.   Vocational Rehab Evaluation & Intervention:     Vocational Rehab - 11/19/15 1332    Initial Vocational Rehab Evaluation & Intervention   Assessment shows need for Vocational Rehabilitation No      Education: Education Goals: Education classes will be provided on a weekly basis, covering required topics. Participant will state understanding/return demonstration of topics presented.  Learning Barriers/Preferences:     Learning Barriers/Preferences - 11/19/15 1333    Learning Barriers/Preferences   Learning Barriers Hearing   Learning Preferences None      Education Topics: General Nutrition Guidelines/Fats and Fiber: -Group instruction provided by verbal, written material, models and posters to present the general guidelines for heart healthy nutrition. Gives an explanation and review of dietary fats and fiber.   Controlling Sodium/Reading Food Labels: -Group verbal and written material supporting the discussion of sodium use in heart healthy nutrition. Review and explanation with models, verbal and written materials for utilization of the food label.          Cardiac Rehab from 01/09/2016 in [facility] Cardiac and Pulmonary Rehab   Date  12/31/15   Educator  C. Joneen Caraway, RD   Instruction Review Code  2- meets goals/outcomes      Exercise Physiology & Risk Factors: - Group verbal and written instruction with models to review the exercise physiology of the cardiovascular  system and associated critical values. Details cardiovascular disease risk factors and the goals associated with each risk factor.      Cardiac Rehab from 01/09/2016 in [facility] Cardiac and Pulmonary Rehab   Date  01/09/16   Educator  BS   Instruction Review Code  2- meets goals/outcomes      Aerobic Exercise & Resistance Training: - Gives group verbal and written discussion on the health impact of inactivity. On the components of aerobic and resistive training programs and the benefits of this training and how to safely progress through these programs.      Cardiac Rehab from 01/09/2016 in [facility] Cardiac and Pulmonary Rehab   Date  11/21/15   Educator  SW   Instruction Review Code  2- meets goals/outcomes      Flexibility, Balance, General Exercise Guidelines: - Provides group verbal and written instruction on the benefits of flexibility and balance training programs. Provides general exercise guidelines with specific guidelines to those with heart or lung disease. Demonstration and skill practice provided.   Stress Management: - Provides group verbal and written instruction about the health risks of elevated stress, cause of high stress, and healthy ways to reduce stress.   Depression: - Provides group verbal and written instruction on the correlation between heart/lung disease and depressed mood, treatment options, and the stigmas associated with seeking treatment.      Cardiac Rehab from 01/09/2016 in [facility] Cardiac and Pulmonary Rehab   Date  12/26/15   Educator  Lucianne Lei,  MSW   Instruction Review Code  2- meets goals/outcomes      Anatomy & Physiology of the Heart: - Group verbal and written instruction and models provide basic cardiac anatomy and physiology, with the coronary electrical and arterial systems. Review of: AMI, Angina, Valve disease, Heart Failure, Cardiac Arrhythmia, Pacemakers, and the ICD.   Cardiac Procedures: - Group verbal and written instruction and models  to describe the testing methods done to diagnose heart disease. Reviews the outcomes of the test results. Describes the treatment choices: Medical Management, Angioplasty, or Coronary Bypass Surgery.      Cardiac Rehab from 01/09/2016 in [facility] Cardiac and Pulmonary Rehab   Date  12/10/15   Educator  SB   Instruction Review Code  2- meets goals/outcomes      Cardiac Medications: - Group verbal and written instruction to review commonly prescribed medications for heart disease. Reviews the medication, class of the drug, and side effects. Includes the steps to properly store meds and maintain the prescription regimen.   Go Sex-Intimacy & Heart Disease, Get SMART - Goal Setting: - Group verbal and written instruction through game format to discuss heart disease and the return to sexual intimacy. Provides group verbal and written material to discuss and apply goal setting through the application of the S.M.A.R.T. Method.      Cardiac Rehab from 01/09/2016 in [facility] Cardiac and Pulmonary Rehab   Date  12/10/15   Educator  SB   Instruction Review Code  2- meets goals/outcomes      Other Matters of the Heart: - Provides group verbal, written materials and models to describe Heart Failure, Angina, Valve Disease, and Diabetes in the realm of heart disease. Includes description of the disease process and treatment options available to the cardiac patient.      Cardiac Rehab from 01/09/2016 in [facility] Cardiac and Pulmonary Rehab   Date  01/07/16   Educator  SB   Instruction Review Code  2- meets goals/outcomes      Exercise & Equipment Safety: - Individual verbal instruction and demonstration of equipment use and safety with use of the equipment.      Cardiac Rehab from 01/09/2016 in [facility] Cardiac and Pulmonary Rehab   Date  11/19/15   Educator  SB   Instruction Review Code  2- meets goals/outcomes      Infection Prevention: - Provides verbal and written material to individual with discussion of  infection control including proper hand washing and proper equipment cleaning during exercise session.      Cardiac Rehab from 01/09/2016 in [facility] Cardiac and Pulmonary Rehab   Date  11/19/15   Educator  Sb   Instruction Review Code  2- meets goals/outcomes      Falls Prevention: - Provides verbal and written material to individual with discussion of falls prevention and safety.      Cardiac Rehab from 01/09/2016 in [facility] Cardiac and Pulmonary Rehab   Date  11/19/15   Educator  SB   Instruction Review Code  2- meets goals/outcomes      Diabetes: - Individual verbal and written instruction to review signs/symptoms of diabetes, desired ranges of glucose level fasting, after meals and with exercise. Advice that pre and post exercise glucose checks will be done for 3 sessions at entry of program.    Knowledge Questionnaire Score:     Knowledge Questionnaire Score - 11/19/15 1330    Knowledge Questionnaire Score   Pre Score 24/28      Core Components/Risk  Factors/Patient Goals at Admission:     Personal Goals and Risk Factors at Admission - 11/19/15 1313    Core Components/Risk Factors/Patient Goals on Admission    Weight Management Yes   Intervention Weight Management: Develop a combined nutrition and exercise program designed to reach desired caloric intake, while maintaining appropriate intake of nutrient and fiber, sodium and fats, and appropriate energy expenditure required for the weight goal.;Weight Management: Provide education and appropriate resources to help participant work on and attain dietary goals.;Weight Management/Obesity: Establish reasonable short term and long term weight goals.;Obesity: Provide education and appropriate resources to help participant work on and attain dietary goals.   Admit Weight 202 lb (91.627 kg)   Goal Weight: Short Term 190 lb (86.183 kg)   Goal Weight: Long Term 180 lb (81.647 kg)   Expected Outcomes Short Term: Continue to assess and  modify interventions until short term weight is achieved.;Long Term: Adherence to nutrition and physical activity/exercise program aimed toward attainment of established weight goal.   Sedentary Yes   Intervention Provide advice, education, support and counseling about physical activity/exercise needs.;Develop an individualized exercise prescription for aerobic and resistive training based on initial evaluation findings, risk stratification, comorbidities and participant's personal goals.   Expected Outcomes Achievement of increased cardiorespiratory fitness and enhanced flexibility, muscular endurance and strength shown through measurements of functional capaciy and personal statement of participant.   Lipids Yes  Currently taking niacin for low HDL. Reports his levels have increased to over 50 from in the 20's.   Intervention Provide education and support for participant on nutrition & aerobic/resistive exercise along with prescribed medications to achieve LDL '70mg'$ , HDL >'40mg'$ .   Expected Outcomes Short Term: Participant states understanding of desired cholesterol values and is compliant with medications prescribed. Participant is following exercise prescription and nutrition guidelines.;Long Term: Cholesterol controlled with medications as prescribed, with individualized exercise RX and with personalized nutrition plan. Value goals: LDL < '70mg'$ , HDL > 40 mg.      Core Components/Risk Factors/Patient Goals Review:      Goals and Risk Factor Review      12/10/15 1548 12/28/15 0905         Core Components/Risk Factors/Patient Goals Review   Personal Goals Review Sedentary;Weight Management/Obesity;Lipids Sedentary;Weight Management/Obesity;Lipids      Review Spoke with Rolly today. He has been here 5 sessions.  PLans to continue with the program and hopes to see progress with weight loss as he sontinues with the exercise and nutrition plan he is providied. He is compliant with his meds and  reports that his HDL is improved to greater than 50 from in the 20;s and that his bad cholseterol is low.  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 He is meeing with the Cardiac Rehab registered dietician soon. He said he lost weight in the past by subsituting a Slim Fast for a meal. He needs to eat something every 2 hours since he gets low blood sugars but he knows when his blood sugar drops. He carries peanut butter crackers. I encoured him to carry a piece of candy or some sugar.       Expected Outcomes Continue with the exercise and nutrition plan to maintain risk factor control and working toward weight loss. Ava wants to get to 180lbs.          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 He is meeing with the Cardiac Rehab registered dietician soon. He said he lost weight in the past by subsituting a Slim Fast for a meal. He needs to eat something every 2 hours since he gets low blood sugars but he knows when his blood sugar drops. He carries peanut butter crackers. I encoured him to carry a piece of candy or some sugar.    Expected Outcomes Trenden wants to get to 180lbs.       ITP Comments:     ITP Comments      11/19/15 1310 11/21/15 0726 01/04/16 1406 01/13/16 0937     ITP Comments Orientation completed.  Initial ITP  Continue with ITP.     30 day review   Orientation is completed to start sessions this week   Continue with ITP 30 day review 30 day review.  Continue with ITP       Comments:

## 2016-01-14 ENCOUNTER — Encounter: Payer: Medicare Other | Admitting: *Deleted

## 2016-01-14 DIAGNOSIS — I214 Non-ST elevation (NSTEMI) myocardial infarction: Secondary | ICD-10-CM | POA: Diagnosis not present

## 2016-01-14 DIAGNOSIS — Z9861 Coronary angioplasty status: Secondary | ICD-10-CM

## 2016-01-14 NOTE — Progress Notes (Signed)
Daily Session Note  Patient Details  Name: NATASHA PAULSON MRN: [id number] Date of Birth: 02-Dec-1940 Referring Provider:    Encounter Date: 01/14/2016  Check In:     Session Check In - 01/14/16 1006    Check-In   Location [facility]   Staff Present Earlean Shawl, BS, ACSM CEP, Exercise Physiologist;Rebecca Brayton El, DPT, CEEA;Heath Lark, RN, BSN, CCRP   Supervising physician immediately available to respond to emergencies See telemetry face sheet for immediately available ER MD   Medication changes reported     No   Fall or balance concerns reported    No   Warm-up and Cool-down Performed on first and last piece of equipment   Resistance Training Performed Yes   VAD Patient? No   Pain Assessment   Currently in Pain? No/denies   Multiple Pain Sites No         Goals Met:  Independence with exercise equipment Exercise tolerated well No report of cardiac concerns or symptoms Strength training completed today  Goals Unmet:  Not Applicable  Comments: Patient completed exercise prescription and all exercise goals during rehab session. The exercise was tolerated well and the patient is progressing in the program.     Dr. [name] is Medical Director for [place] and [facility].

## 2016-01-16 ENCOUNTER — Encounter: Payer: Medicare Other | Admitting: *Deleted

## 2016-01-16 DIAGNOSIS — Z9861 Coronary angioplasty status: Secondary | ICD-10-CM

## 2016-01-16 DIAGNOSIS — I214 Non-ST elevation (NSTEMI) myocardial infarction: Secondary | ICD-10-CM

## 2016-01-16 NOTE — Progress Notes (Signed)
Daily Session Note  Patient Details  Name: BARRETT GOLDIE MRN: [id number] Date of Birth: 1941-06-11 Referring Provider:    Encounter Date: 01/16/2016  Check In:     Session Check In - 01/16/16 0842    Check-In   Staff Present Nyoka Cowden, RN;Susanne Bice, RN, BSN, CCRP;Rebecca Sickles, DPT, [place] physician immediately available to respond to emergencies See telemetry face sheet for immediately available ER MD   Medication changes reported     No   Fall or balance concerns reported    No   Warm-up and Cool-down Performed on first and last piece of equipment   VAD Patient? No   Pain Assessment   Currently in Pain? No/denies         Goals Met:  Exercise tolerated well No report of cardiac concerns or symptoms Strength training completed today  Goals Unmet:  Not Applicable  Comments: Doing well with exercise prescription progression.    Dr. [name] is Medical Director for [place] and [facility].

## 2016-01-18 ENCOUNTER — Encounter: Payer: Medicare Other | Admitting: *Deleted

## 2016-01-18 DIAGNOSIS — I214 Non-ST elevation (NSTEMI) myocardial infarction: Secondary | ICD-10-CM | POA: Diagnosis not present

## 2016-01-18 DIAGNOSIS — Z9861 Coronary angioplasty status: Secondary | ICD-10-CM

## 2016-01-18 NOTE — Progress Notes (Signed)
Daily Session Note  Patient Details  Name: Justin [name] MRN: [id number] Date of Birth: [birth year] Referring Provider:    Encounter Date: 01/18/2016  Check In:     Session Check In - 01/18/16 0915    Check-In   Staff Present Nyoka Cowden, RN;Emersen Carroll, RN, BSN, CCRP;Rebecca Sickles, DPT, [place] physician immediately available to respond to emergencies See telemetry face sheet for immediately available ER MD   Medication changes reported     No   Fall or balance concerns reported    No   Warm-up and Cool-down Performed on first and last piece of equipment   VAD Patient? No   Pain Assessment   Currently in Pain? No/denies         Goals Met:  Independence with exercise equipment Exercise tolerated well No report of cardiac concerns or symptoms Strength training completed today  Goals Unmet:  Not Applicable  Comments: Doing well with exercise prescription progression.    Dr. [name] is Medical Director for [place] and [facility].

## 2016-01-21 ENCOUNTER — Encounter: Payer: Medicare Other | Attending: Cardiology | Admitting: *Deleted

## 2016-01-21 DIAGNOSIS — I214 Non-ST elevation (NSTEMI) myocardial infarction: Secondary | ICD-10-CM | POA: Diagnosis present

## 2016-01-21 DIAGNOSIS — Z9861 Coronary angioplasty status: Secondary | ICD-10-CM | POA: Diagnosis present

## 2016-01-21 NOTE — Patient Instructions (Signed)
Discharge Instructions  Patient Details  Name: Justin [name] MRN: [id number] Date of Birth: 24-Feb-1941 Referring Provider:  Isaias Cowman, MD   Number of Visits:   Reason for Discharge:  Patient reached a stable level of exercise.  Smoking History:  History  Smoking status  . Former Smoker  . Quit date: 06/26/1990  Smokeless tobacco  . Never Used    Diagnosis:  NSTEMI (non-ST elevated myocardial infarction) (HCC)  S/P PTCA (percutaneous transluminal coronary angioplasty)  Initial Exercise Prescription:     Initial Exercise Prescription - 11/19/15 1400    Date of Initial Exercise RX and Referring Provider   Date 11/19/15   Treadmill   MPH 2   Grade 0   Minutes 15   Bike   Level 1   Minutes 15   Recumbant Bike   Level 2   Watts 30   NuStep   Level 3   Watts 40   Minutes 15   Arm Ergometer   Level 1   Watts 8   Minutes 15   Arm/Foot Ergometer   Level 1   Watts 10   Minutes 15   Cybex   Level 2   RPM 40   Minutes 15   Recumbant Elliptical   Level 2   Watts 20   Minutes 15   Elliptical   Level 1   Speed 3   Minutes 5   REL-XR   Level 3   Watts 40   Minutes 15   T5 Nustep   Level 2   Watts 20   Minutes 15   Biostep-RELP   Level 3   Watts 40   Minutes 15   Prescription Details   Frequency (times per week) 3   Duration Progress to 30 minutes of continuous aerobic without signs/symptoms of physical distress   Intensity   THRR REST +  30   Ratings of Perceived Exertion 11-15   Perceived Dyspnea 2-4   Progression   Progression Continue progressive overload as per policy without signs/symptoms or physical distress.   Resistance Training   Training Prescription Yes   Weight 2   Reps 10-15      Discharge Exercise Prescription (Final Exercise Prescription Changes):     Exercise Prescription Changes - 12/31/15 0800    Exercise Review   Progression Yes   Response to Exercise   Blood Pressure (Admit) 120/68 mmHg   Blood  Pressure (Exercise) 148/82 mmHg   Blood Pressure (Exit) 116/72 mmHg   Heart Rate (Admit) 85 bpm   Heart Rate (Exercise) 112 bpm   Heart Rate (Exit) 71 bpm   Rating of Perceived Exertion (Exercise) 14   Symptoms None   Comments Reveiwed individual exercise prescription with patient. He tolerated it well with no signs or symptoms.    Duration Progress to 45 minutes of aerobic exercise without signs/symptoms of physical distress   Intensity Rest + 30   Progression   Progression Continue progressive overload as per policy without signs/symptoms or physical distress.   Resistance Training   Training Prescription Yes   Weight 4   Reps 10-15   Interval Training   Interval Training Yes   Equipment REL-XR   Comments Interval Training was recommended to the patient to begin with 2 min x 30 second high intensity intervals on the XR. 30 second interval should be done at a 15 RPE.    Treadmill   MPH 3.4   Grade 2   Minutes 25  Bike   Level --   Minutes --   Recumbant Bike   Level --   Watts --   NuStep   Level --   Watts --   Minutes --   Arm Ergometer   Level --   Watts --   Minutes --   Arm/Foot Ergometer   Level --   Watts --   Minutes --   Cybex   Level --   RPM --   Minutes --   Recumbant Elliptical   Level --   Watts --   Minutes --   Elliptical   Level --   Speed --   Minutes --   REL-XR   Level 10   Watts 124   Minutes 20   T5 Nustep   Level --   Watts --   Minutes --   Biostep-RELP   Level --   Watts --   Minutes --      Functional Capacity:     6 Minute Walk      11/19/15 1420       6 Minute Walk   Phase Initial     Distance 1300 feet     Walk Time 6 minutes     # of Rest Breaks 0     RPE 13     Symptoms Yes (comment)     Comments short of breath and leg weakness     Resting HR 60 bpm     Resting BP 112/72 mmHg     Max Ex. HR 84 bpm     Max Ex. BP 132/78 mmHg        Quality of Life:     Quality of Life - 01/21/16 1230     Quality of Life Scores   Health/Function Post 21.6 %   Socioeconomic Post 23.14 %   Psych/Spiritual Post 23.57 %   Family Post 23.5 %   GLOBAL Post 22.6 %      Personal Goals: Goals established at orientation with interventions provided to work toward goal.     Personal Goals and Risk Factors at Admission - 11/19/15 1313    Core Components/Risk Factors/Patient Goals on Admission    Weight Management Yes   Intervention Weight Management: Develop a combined nutrition and exercise program designed to reach desired caloric intake, while maintaining appropriate intake of nutrient and fiber, sodium and fats, and appropriate energy expenditure required for the weight goal.;Weight Management: Provide education and appropriate resources to help participant work on and attain dietary goals.;Weight Management/Obesity: Establish reasonable short term and long term weight goals.;Obesity: Provide education and appropriate resources to help participant work on and attain dietary goals.   Admit Weight 202 lb (91.627 kg)   Goal Weight: Short Term 190 lb (86.183 kg)   Goal Weight: Long Term 180 lb (81.647 kg)   Expected Outcomes Short Term: Continue to assess and modify interventions until short term weight is achieved.;Long Term: Adherence to nutrition and physical activity/exercise program aimed toward attainment of established weight goal.   Sedentary Yes   Intervention Provide advice, education, support and counseling about physical activity/exercise needs.;Develop an individualized exercise prescription for aerobic and resistive training based on initial evaluation findings, risk stratification, comorbidities and participant's personal goals.   Expected Outcomes Achievement of increased cardiorespiratory fitness and enhanced flexibility, muscular endurance and strength shown through measurements of functional capaciy and personal statement of participant.   Lipids Yes  Currently taking niacin for low  HDL. Reports his levels have  increased to over 50 from in the 20's.   Intervention Provide education and support for participant on nutrition & aerobic/resistive exercise along with prescribed medications to achieve LDL 70mg , HDL >40mg .   Expected Outcomes Short Term: Participant states understanding of desired cholesterol values and is compliant with medications prescribed. Participant is following exercise prescription and nutrition guidelines.;Long Term: Cholesterol controlled with medications as prescribed, with individualized exercise RX and with personalized nutrition plan. Value goals: LDL < 70mg , HDL > 40 mg.       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 He is meeing with the Cardiac Rehab registered dietician soon. He said he lost weight in the past by subsituting a Slim Fast for a meal. He needs to eat something every 2 hours since he gets low blood sugars but he knows when his blood sugar drops. He carries peanut butter crackers. I encoured him to carry a piece of candy or some sugar.    Expected Outcomes Justin [name] wants to get to 180lbs.       Nutrition & Weight - Outcomes:     Pre Biometrics - 11/19/15 1425    Pre Biometrics   Height 5\' 9"  (1.753 m)   Weight 202 lb 9.6 oz (91.899 kg)   Waist Circumference 44 inches   Hip Circumference 43.5 inches   Waist to Hip Ratio 1.01 %   BMI (Calculated) 30       Nutrition:     Nutrition Therapy & Goals - 01/08/16 1652    Nutrition Therapy   Diet Heart Healthy dietary guidelines based on 1700 calories   Drug/Food Interactions Statins/Certain Fruits   Protein (specify units) 7   Fiber 30 grams   Whole Grain Foods 3 servings   Saturated Fats 12 max. grams   Fruits and  Vegetables 5 servings/day   Sodium 1500 grams   Personal Nutrition Goals   Personal Goal #1 Include more whole grains through 100% whole wheat bread, oatmeal, 1/2 cup brown rice, corn, popcorn   Personal Goal #2 Include at least 7 oz.of a protein food. Refer to list   Personal Goal #3 Include a protein food at breakfast to help avoid low blood sugar.   Personal Goal #4 Add 1-2 servings of fruit per day.   Intervention Plan   Intervention Prescribe, educate and counsel regarding individualized specific dietary modifications aiming towards targeted core components such as weight, hypertension, lipid management, diabetes, heart failure and other comorbidities.;Nutrition handout(s) given to patient.   Expected Outcomes Short Term Goal: A plan has been developed with personal nutrition goals set during dietitian appointment.;Short Term Goal: Understand basic principles of dietary content, such as calories, fat, sodium, cholesterol and nutrients.;Long Term Goal: Adherence to prescribed nutrition plan.      Nutrition Discharge:     Nutrition Assessments - 01/21/16 1230    Rate Your Plate Scores   Post Score 68   Post Score % 75.5 %      Education Questionnaire Score:     Knowledge Questionnaire Score - 01/21/16 1230    Knowledge Questionnaire Score   Post Score 27      Goals reviewed with patient; copy given to patient.

## 2016-01-21 NOTE — Progress Notes (Signed)
Daily Session Note  Patient Details  Name: Justin [name] MRN: [id number] Date of Birth: 1941/07/21 Referring Provider:    Encounter Date: 01/21/2016  Check In:     Session Check In - 01/21/16 1030    Check-In   Staff Present Heath Lark, RN, BSN, CCRP;Carroll Enterkin, RN, Moises Blood, BS, ACSM CEP, Exercise Physiologist   Supervising physician immediately available to respond to emergencies See telemetry face sheet for immediately available ER MD   Medication changes reported     No   Fall or balance concerns reported    No   Warm-up and Cool-down Performed on first and last piece of equipment   VAD Patient? No   Pain Assessment   Currently in Pain? No/denies         Goals Met:  Independence with exercise equipment Exercise tolerated well No report of cardiac concerns or symptoms  Goals Unmet:  Not Applicable  Comments: Doing well with exercise prescription progression.    Dr. [name] is Medical Director for [place] and [facility].

## 2016-01-21 NOTE — Progress Notes (Signed)
Cardiac Individual Treatment Plan  Patient Details  Name: Justin [name] MRN: [id number] Date of Birth: Oct 01, 1940 Referring Provider:    Initial Encounter Date:       Cardiac Rehab from 11/19/2015 in [facility] Cardiac and Pulmonary Rehab   Date  11/19/15      Visit Diagnosis: NSTEMI (non-ST elevated myocardial infarction) ([place])  S/P PTCA (percutaneous transluminal coronary angioplasty)  Patient's Home Medications on Admission:  Current outpatient prescriptions:  .  acetaminophen (TYLENOL) 325 MG tablet, Take 650 mg by mouth 3 (three) times daily. , Disp: , Rfl:  .  aspirin EC 325 MG EC tablet, Take 1 tablet (325 mg total) by mouth daily., Disp: 30 tablet, Rfl: 0 .  atorvastatin (LIPITOR) 80 MG tablet, Take 40 mg by mouth at bedtime. , Disp: , Rfl:  .  Calcium Carb-Cholecalciferol (CALCIUM 600 + D PO), Take 1 tablet by mouth 2 (two) times daily. Take morning and at noon., Disp: , Rfl:  .  cholecalciferol (VITAMIN D) 1000 units tablet, Take 1,000 Units by mouth 3 (three) times daily., Disp: , Rfl:  .  clopidogrel (PLAVIX) 75 MG tablet, Take 1 tablet (75 mg total) by mouth daily., Disp: 30 tablet, Rfl: 0 .  fexofenadine (ALLEGRA) 180 MG tablet, Take 180 mg by mouth daily before breakfast. , Disp: , Rfl:  .  fluticasone (FLONASE) 50 MCG/ACT nasal spray, Place 2 sprays into both nostrils at bedtime., Disp: , Rfl:  .  isosorbide mononitrate (IMDUR) 60 MG 24 hr tablet, Take 60 mg by mouth daily., Disp: , Rfl:  .  LACTOBACILLUS PO, Take 2 tablets by mouth every morning. , Disp: , Rfl:  .  levofloxacin (LEVAQUIN) 750 MG tablet, Take 1 tablet (750 mg total) by mouth daily. (Patient not taking: Reported on 11/19/2015), Disp: 4 tablet, Rfl: 0 .  Magnesium 400 MG CAPS, Take 400 mg by mouth 2 (two) times daily. , Disp: , Rfl:  .  metoprolol (LOPRESSOR) 50 MG tablet, Take 50 mg by mouth 2 (two) times daily., Disp: , Rfl:  .  niacin 500 MG tablet, Take 500 mg by mouth 3 (three) times daily. Take one tablet  in the morning, take one tablet in the afternoon and take one tablet at night, Disp: , Rfl:  .  nitroGLYCERIN (NITROSTAT) 0.4 MG SL tablet, Place 0.4 mg under the tongue every 5 (five) minutes x 3 doses as needed for chest pain. If no relief call MD or go to emergency room., Disp: , Rfl:  .  Omega-3 Fatty Acids (FISH OIL) 1000 MG CAPS, Take 1,000 mg by mouth 2 (two) times daily., Disp: , Rfl:  .  omeprazole (PRILOSEC) 20 MG capsule, Take 20 mg by mouth 2 (two) times daily before a meal., Disp: , Rfl:  .  senna-docusate (SENOKOT-S) 8.6-50 MG per tablet, Take 2 tablets by mouth 2 (two) times daily. 4 TIMES DAILY, Disp: , Rfl:  .  traMADol (ULTRAM) 50 MG tablet, Take 50 mg by mouth 3 (three) times daily. , Disp: , Rfl:  .  vitamin C (ASCORBIC ACID) 500 MG tablet, Take 500 mg by mouth daily., Disp: , Rfl:  .  Zoledronic Acid (RECLAST IV), Inject into the vein. Injection once yearly, Disp: , Rfl:   Past Medical History: Past Medical History  Diagnosis Date  . Skin cancer   . History of open heart surgery   . CAD (coronary artery disease)   . Arthritis   . DJD (degenerative joint disease)  Tobacco Use: History  Smoking status  . Former Smoker  . Quit date: 06/26/1990  Smokeless tobacco  . Never Used    Labs: Recent Review Flowsheet Data    Labs for ITP Cardiac and Pulmonary Rehab Latest Ref Rng 11/04/2015   Cholestrol 0 - 200 mg/dL 98   LDLCALC 0 - 99 mg/dL 29   HDL >40 mg/dL 56   Trlycerides <150 mg/dL 64       Exercise Target Goals:    Exercise Program Goal: Individual exercise prescription set with THRR, safety & activity barriers. Participant demonstrates ability to understand and report RPE using BORG scale, to self-measure pulse accurately, and to acknowledge the importance of the exercise prescription.  Exercise Prescription Goal: Starting with aerobic activity 30 plus minutes a day, 3 days per week for initial exercise prescription. Provide home exercise prescription  and guidelines that participant acknowledges understanding prior to discharge.  Activity Barriers & Risk Stratification:     Activity Barriers & Cardiac Risk Stratification - 11/19/15 1328    Activity Barriers & Cardiac Risk Stratification   Activity Barriers Back Problems;Deconditioning  Bulging disc L2 and L3. Usually will not bother with exercise. Most bother when sitting for periods of time.    Cardiac Risk Stratification High      6 Minute Walk:     6 Minute Walk      11/19/15 1420       6 Minute Walk   Phase Initial     Distance 1300 feet     Walk Time 6 minutes     # of Rest Breaks 0     RPE 13     Symptoms Yes (comment)     Comments short of breath and leg weakness     Resting HR 60 bpm     Resting BP 112/72 mmHg     Max Ex. HR 84 bpm     Max Ex. BP 132/78 mmHg        Initial Exercise Prescription:     Initial Exercise Prescription - 11/19/15 1400    Date of Initial Exercise RX and Referring Provider   Date 11/19/15   Treadmill   MPH 2   Grade 0   Minutes 15   Bike   Level 1   Minutes 15   Recumbant Bike   Level 2   Watts 30   NuStep   Level 3   Watts 40   Minutes 15   Arm Ergometer   Level 1   Watts 8   Minutes 15   Arm/Foot Ergometer   Level 1   Watts 10   Minutes 15   Cybex   Level 2   RPM 40   Minutes 15   Recumbant Elliptical   Level 2   Watts 20   Minutes 15   Elliptical   Level 1   Speed 3   Minutes 5   REL-XR   Level 3   Watts 40   Minutes 15   T5 Nustep   Level 2   Watts 20   Minutes 15   Biostep-RELP   Level 3   Watts 40   Minutes 15   Prescription Details   Frequency (times per week) 3   Duration Progress to 30 minutes of continuous aerobic without signs/symptoms of physical distress   Intensity   THRR REST +  30   Ratings of Perceived Exertion 11-15   Perceived Dyspnea 2-4   Progression   Progression  Continue progressive overload as per policy without signs/symptoms or physical distress.   Resistance  Training   Training Prescription Yes   Weight 2   Reps 10-15      Perform Capillary Blood Glucose checks as needed.  Exercise Prescription Changes:     Exercise Prescription Changes      11/19/15 1400 12/03/15 1000 12/10/15 0800 12/14/15 0900 12/31/15 0800   Exercise Review   Progression  Yes Yes Yes Yes   Response to Exercise   Blood Pressure (Admit) 112/72 mmHg 130/70 mmHg   120/68 mmHg   Blood Pressure (Exercise) 132/78 mmHg 148/70 mmHg   148/82 mmHg   Blood Pressure (Exit) 122/70 mmHg 110/62 mmHg   116/72 mmHg   Heart Rate (Admit) 60 bpm 65 bpm   85 bpm   Heart Rate (Exercise) 84 bpm 88 bpm   112 bpm   Heart Rate (Exit) 64 bpm 73 bpm   71 bpm   Rating of Perceived Exertion (Exercise) _0 Symptoms  None None None None   Comments  Reviewed individualized exercise prescription and made increases per departmental policy. Exercise increases were discussed with the patient and they were able to perform the new work loads without issue (no signs or symptoms).  Reviewed individualized exercise prescription and made increases per departmental policy. Exercise increases were discussed with the patient and they were able to perform the new work loads without issue (no signs or symptoms). Patient's weight for the strength training portion was increased. All cardiac intensities remained the same today.  Reviewed individualized exercise prescription and made increases per departmental policy. Exercise increases were discussed with the patient and they were able to perform the new work loads without issue (no signs or symptoms). Patient's weight for the strength training portion was increased. All cardiac intensities remained the same today.  Reveiwed individual exercise prescription with patient. He tolerated it well with no signs or symptoms.    Duration  Progress to 45 minutes of aerobic exercise without signs/symptoms of physical distress Progress to 45 minutes of aerobic exercise without  signs/symptoms of physical distress Progress to 45 minutes of aerobic exercise without signs/symptoms of physical distress Progress to 45 minutes of aerobic exercise without signs/symptoms of physical distress   Intensity  Rest + 30 Rest + 30 Rest + 30 Rest + 30   Progression   Progression  Continue progressive overload as per policy without signs/symptoms or physical distress. Continue progressive overload as per policy without signs/symptoms or physical distress. Continue progressive overload as per policy without signs/symptoms or physical distress. Continue progressive overload as per policy without signs/symptoms or physical distress.   Resistance Training   Training Prescription  Yes Yes Yes Yes   Weight  _1 Reps  10-15 10-15 10-15 10-15   Interval Training   Interval Training     Yes   Equipment     REL-XR   Comments     Interval Training was recommended to the patient to begin with 2 min x 30 second high intensity intervals on the XR. 30 second interval should be done at a 15 RPE.    Treadmill   MPH  _2 3.4   Grade  0 0 0 2   Minutes  _3 Bike   Level  _4 --   Minutes  _5 --   Recumbant Bike   Level  2  2 2 --   Watts  _0 --   NuStep   Level  _1 --   Watts  40 40 40 --   Minutes  _2 --   Arm Ergometer   Level  _3 --   Watts  _4 --   Minutes  _5 --   Arm/Foot Ergometer   Level  _6 --   Watts  _7 --   Minutes  _8 --   Cybex   Level  _9 --   RPM  40 40 40 --   Minutes  _10 --   Recumbant Elliptical   Level  _11 --   Watts  _12 --   Minutes  _13 --   Elliptical   Level  _14 --   Speed  _15 --   Minutes  _16 --   REL-XR   Level  _17 Watts  40 40 40 124   Minutes  _18 T5 Nustep   Level  _19 --   Watts  _20 --   Minutes  _21 --   Biostep-RELP   Level  _22 --   Watts  40 40 40 --   Minutes  _23 --      Exercise Comments:      Exercise Comments      12/14/15 0916 12/31/15 1154         Exercise Comments [name] that have helped him recently since he felt the Nustep hurt his back a little back the other day. He liked the recumbent elliptical today.  Justin [name] stated that he has noticed an increase in energy in doing daily activities. He has been able to do some yard work this week without symptoms. He enjoys woodworking and building and has been able to get back to doing that sometimes.          Discharge Exercise Prescription (Final Exercise Prescription Changes):     Exercise Prescription Changes - 12/31/15 0800    Exercise Review   Progression Yes   Response to Exercise   Blood Pressure (Admit) 120/68 mmHg   Blood Pressure (Exercise) 148/82 mmHg   Blood Pressure (Exit) 116/72 mmHg   Heart Rate (Admit) 85 bpm   Heart Rate (Exercise) 112 bpm   Heart Rate (Exit) 71 bpm   Rating of Perceived Exertion (Exercise) 14   Symptoms None   Comments Reveiwed individual exercise prescription with patient. He tolerated it well with no signs or symptoms.    Duration Progress to 45 minutes of aerobic exercise without signs/symptoms of physical distress   Intensity Rest + 30   Progression   Progression Continue progressive overload as per policy without signs/symptoms or physical distress.   Resistance Training   Training Prescription Yes   Weight 4   Reps 10-15   Interval Training   Interval Training Yes   Equipment REL-XR   Comments Interval Training was recommended to the patient to begin with 2 min x 30 second high intensity intervals on the XR. 30 second interval should be done at a 15 RPE.    Treadmill   MPH 3.4   Grade 2   Minutes 25  Bike   Level --   Minutes --   Recumbant Bike   Level --   Watts --   NuStep   Level --   Watts --   Minutes --   Arm Ergometer   Level --   Watts --   Minutes --   Arm/Foot Ergometer   Level --   Watts --   Minutes --    Cybex   Level --   RPM --   Minutes --   Recumbant Elliptical   Level --   Watts --   Minutes --   Elliptical   Level --   Speed --   Minutes --   REL-XR   Level 10   Watts 124   Minutes 20   T5 Nustep   Level --   Watts --   Minutes --   Biostep-RELP   Level --   Watts --   Minutes --      Nutrition:  Target Goals: Understanding of nutrition guidelines, daily intake of sodium <1589m, cholesterol <2073m calories 30% from fat and 7% or less from saturated fats, daily to have 5 or more servings of fruits and vegetables.  Biometrics:     Pre Biometrics - 11/19/15 1425    Pre Biometrics   Height _0  (1.753 m)   Weight 202 lb 9.6 oz (91.899 kg)   Waist Circumference 44 inches   Hip Circumference 43.5 inches   Waist to Hip Ratio 1.01 %   BMI (Calculated) 30       Nutrition Therapy Plan and Nutrition Goals:     Nutrition Therapy & Goals - 01/08/16 1652    Nutrition Therapy   Diet Heart Healthy dietary guidelines based on 1700 calories   Drug/Food Interactions Statins/Certain Fruits   Protein (specify units) 7   Fiber 30 grams   Whole Grain Foods 3 servings   Saturated Fats 12 max. grams   Fruits and Vegetables 5 servings/day   Sodium 1500 grams   Personal Nutrition Goals   Personal Goal #1 Include more whole grains through 100% whole wheat bread, oatmeal, 1/2 cup brown rice, corn, popcorn   Personal Goal #2 Include at least 7 oz.of a protein food. Refer to list   Personal Goal #3 Include a protein food at breakfast to help avoid low blood sugar.   Personal Goal #4 Add 1-2 servings of fruit per day.   Intervention Plan   Intervention Prescribe, educate and counsel regarding individualized specific dietary modifications aiming towards targeted core components such as weight, hypertension, lipid management, diabetes, heart failure and other comorbidities.;Nutrition handout(s) given to patient.   Expected Outcomes Short Term Goal: A plan has been developed  with personal nutrition goals set during dietitian appointment.;Short Term Goal: Understand basic principles of dietary content, such as calories, fat, sodium, cholesterol and nutrients.;Long Term Goal: Adherence to prescribed nutrition plan.      Nutrition Discharge: Rate Your Plate Scores:     Nutrition Assessments - 01/21/16 1230    Rate Your Plate Scores   Post Score 68   Post Score % 75.5 %      Nutrition Goals Re-Evaluation:     Nutrition Goals Re-Evaluation      12/28/15 0913           Personal Goal #1 Re-Evaluation   Personal Goal #1 Trying to lose weight. Has low blood sugars so needs to eat small meals or something every 2 hours.  Goal Progress Seen Yes          Psychosocial: Target Goals: Acknowledge presence or absence of depression, maximize coping skills, provide positive support system. Participant is able to verbalize types and ability to use techniques and skills needed for reducing stress and depression.  Initial Review & Psychosocial Screening:     Initial Psych Review & Screening - 11/19/15 1320    Family Dynamics   Good Support System? Yes  "My wife is fantastic". Has a daughter that is available .   Barriers   Psychosocial barriers to participate in program There are no identifiable barriers or psychosocial needs.;The patient should benefit from training in stress management and relaxation.   Screening Interventions   Interventions Encouraged to exercise      Quality of Life Scores:     Quality of Life - 01/21/16 1230    Quality of Life Scores   Health/Function Post 21.6 %   Socioeconomic Post 23.14 %   Psych/Spiritual Post 23.57 %   Family Post 23.5 %   GLOBAL Post 22.6 %      PHQ-9:     Recent Review Flowsheet Data    Depression screen [facility] 2/9 11/19/2015   Decreased Interest 0   Down, Depressed, Hopeless 0   PHQ - 2 Score 0   Altered sleeping 0   Tired, decreased energy 3   Change in appetite 3   Feeling bad or failure  about yourself  0   Trouble concentrating 0   Moving slowly or fidgety/restless 0   Suicidal thoughts 0   PHQ-9 Score 6   Difficult doing work/chores Somewhat difficult       Psychosocial Evaluation and Intervention:     Psychosocial Evaluation - 11/21/15 0935    Psychosocial Evaluation & Interventions   Interventions Encouraged to exercise with the program and follow exercise prescription   Comments Counselor met with Justin [name] today for initial psychosocial evaluation.  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  He has a strong support system with a spouse of 41 years and a daughter who lives close by.  He also is actively involved in his local church community.  Justin [name] has other medical issues that impact his health, including severe osteoporsis and sleep apnea.  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  Justin [name] has a history of sleep apnea and states he uses a ventilator at night.  He has a good appetite and denies a history or current symptoms of depresion or anxiety.  Justin [name] states he is typically in a positive mood and has minimal stress in his life at this time.  His goals for this program are to breathe better and lose some weight.  He has a treadmill at home and plans to begin using that and join [employer] following completion of this program.        Psychosocial Re-Evaluation:     Psychosocial Re-Evaluation      12/28/15 0910 12/31/15 0942         Psychosocial Re-Evaluation   Interventions Encouraged to attend Cardiac Rehabilitation for the exercise       Comments Justin [name] said his brother has moved in with him afer his brother was in the CCU the same time Harm was for his heart attack. His brother went to phyiscal therapy rehab then came to live with them with home health due to his CHF. Gave info to give  to his brother about attending CR after home health is done.  Counselor follow up with Justin [name] today  reporting feeling stronger, breathing better and sleeping better - other than his back hurting - since he came into this program.  Counselor and Justin [name] discussed his options once he completes this program with Silver Sneakers being an option for consistent exercise.  Justin [name] continues to maintain a positive mood.  Counselor commended him for all of his hard work and progress already made in this program.           Vocational Rehabilitation: Provide vocational rehab assistance to qualifying candidates.   Vocational Rehab Evaluation & Intervention:     Vocational Rehab - 11/19/15 1332    Initial Vocational Rehab Evaluation & Intervention   Assessment shows need for Vocational Rehabilitation No      Education: Education Goals: Education classes will be provided on a weekly basis, covering required topics. Participant will state understanding/return demonstration of topics presented.  Learning Barriers/Preferences:     Learning Barriers/Preferences - 11/19/15 1333    Learning Barriers/Preferences   Learning Barriers Hearing   Learning Preferences None      Education Topics: General Nutrition Guidelines/Fats and Fiber: -Group instruction provided by verbal, written material, models and posters to present the general guidelines for heart healthy nutrition. Gives an explanation and review of dietary fats and fiber.   Controlling Sodium/Reading Food Labels: -Group verbal and written material supporting the discussion of sodium use in heart healthy nutrition. Review and explanation with models, verbal and written materials for utilization of the food label.          Cardiac Rehab from 01/21/2016 in [facility] Cardiac and Pulmonary Rehab   Date  12/31/15   Educator  C. Joneen Caraway, RD   Instruction Review Code  2- meets goals/outcomes      Exercise Physiology & Risk Factors: - Group verbal and written instruction with models to review the exercise physiology of the cardiovascular  system and associated critical values. Details cardiovascular disease risk factors and the goals associated with each risk factor.      Cardiac Rehab from 01/21/2016 in [facility] Cardiac and Pulmonary Rehab   Date  01/09/16   Educator  BS   Instruction Review Code  2- meets goals/outcomes      Aerobic Exercise & Resistance Training: - Gives group verbal and written discussion on the health impact of inactivity. On the components of aerobic and resistive training programs and the benefits of this training and how to safely progress through these programs.      Cardiac Rehab from 01/21/2016 in [facility] Cardiac and Pulmonary Rehab   Date  01/14/16   Educator  RS   Instruction Review Code  2- meets goals/outcomes      Flexibility, Balance, General Exercise Guidelines: - Provides group verbal and written instruction on the benefits of flexibility and balance training programs. Provides general exercise guidelines with specific guidelines to those with heart or lung disease. Demonstration and skill practice provided.      Cardiac Rehab from 01/21/2016 in [facility] Cardiac and Pulmonary Rehab   Date  01/16/16   Educator  bs   Instruction Review Code  2- meets goals/outcomes      Stress Management: - Provides group verbal and written instruction about the health risks of elevated stress, cause of high stress, and healthy ways to reduce stress.   Depression: - Provides group verbal and written instruction on the correlation between heart/lung disease and  depressed mood, treatment options, and the stigmas associated with seeking treatment.      Cardiac Rehab from 01/21/2016 in [facility] Cardiac and Pulmonary Rehab   Date  12/26/15   Educator  Lucianne Lei, MSW   Instruction Review Code  2- meets goals/outcomes      Anatomy & Physiology of the Heart: - Group verbal and written instruction and models provide basic cardiac anatomy and physiology, with the coronary electrical and arterial systems. Review of: AMI,  Angina, Valve disease, Heart Failure, Cardiac Arrhythmia, Pacemakers, and the ICD.      Cardiac Rehab from 01/21/2016 in [facility] Cardiac and Pulmonary Rehab   Date  01/21/16   Educator  SB   Instruction Review Code  2- meets goals/outcomes      Cardiac Procedures: - Group verbal and written instruction and models to describe the testing methods done to diagnose heart disease. Reviews the outcomes of the test results. Describes the treatment choices: Medical Management, Angioplasty, or Coronary Bypass Surgery.      Cardiac Rehab from 01/21/2016 in [facility] Cardiac and Pulmonary Rehab   Date  12/10/15   Educator  SB   Instruction Review Code  2- meets goals/outcomes      Cardiac Medications: - Group verbal and written instruction to review commonly prescribed medications for heart disease. Reviews the medication, class of the drug, and side effects. Includes the steps to properly store meds and maintain the prescription regimen.   Go Sex-Intimacy & Heart Disease, Get SMART - Goal Setting: - Group verbal and written instruction through game format to discuss heart disease and the return to sexual intimacy. Provides group verbal and written material to discuss and apply goal setting through the application of the S.M.A.R.T. Method.      Cardiac Rehab from 01/21/2016 in [facility] Cardiac and Pulmonary Rehab   Date  12/10/15   Educator  SB   Instruction Review Code  2- meets goals/outcomes      Other Matters of the Heart: - Provides group verbal, written materials and models to describe Heart Failure, Angina, Valve Disease, and Diabetes in the realm of heart disease. Includes description of the disease process and treatment options available to the cardiac patient.      Cardiac Rehab from 01/21/2016 in [facility] Cardiac and Pulmonary Rehab   Date  01/21/16   Educator  SB   Instruction Review Code  2- meets goals/outcomes      Exercise & Equipment Safety: - Individual verbal instruction and demonstration of  equipment use and safety with use of the equipment.      Cardiac Rehab from 01/21/2016 in [facility] Cardiac and Pulmonary Rehab   Date  11/19/15   Educator  SB   Instruction Review Code  2- meets goals/outcomes      Infection Prevention: - Provides verbal and written material to individual with discussion of infection control including proper hand washing and proper equipment cleaning during exercise session.      Cardiac Rehab from 01/21/2016 in [facility] Cardiac and Pulmonary Rehab   Date  11/19/15   Educator  Sb   Instruction Review Code  2- meets goals/outcomes      Falls Prevention: - Provides verbal and written material to individual with discussion of falls prevention and safety.      Cardiac Rehab from 01/21/2016 in [facility] Cardiac and Pulmonary Rehab   Date  11/19/15   Educator  SB   Instruction Review Code  2- meets goals/outcomes      Diabetes: -  Individual verbal and written instruction to review signs/symptoms of diabetes, desired ranges of glucose level fasting, after meals and with exercise. Advice that pre and post exercise glucose checks will be done for 3 sessions at entry of program.    Knowledge Questionnaire Score:     Knowledge Questionnaire Score - 01/21/16 1230    Knowledge Questionnaire Score   Post Score 27      Core Components/Risk Factors/Patient Goals at Admission:     Personal Goals and Risk Factors at Admission - 11/19/15 1313    Core Components/Risk Factors/Patient Goals on Admission    Weight Management Yes   Intervention Weight Management: Develop a combined nutrition and exercise program designed to reach desired caloric intake, while maintaining appropriate intake of nutrient and fiber, sodium and fats, and appropriate energy expenditure required for the weight goal.;Weight Management: Provide education and appropriate resources to help participant work on and attain dietary goals.;Weight Management/Obesity: Establish reasonable short term and long term  weight goals.;Obesity: Provide education and appropriate resources to help participant work on and attain dietary goals.   Admit Weight 202 lb (91.627 kg)   Goal Weight: Short Term 190 lb (86.183 kg)   Goal Weight: Long Term 180 lb (81.647 kg)   Expected Outcomes Short Term: Continue to assess and modify interventions until short term weight is achieved.;Long Term: Adherence to nutrition and physical activity/exercise program aimed toward attainment of established weight goal.   Sedentary Yes   Intervention Provide advice, education, support and counseling about physical activity/exercise needs.;Develop an individualized exercise prescription for aerobic and resistive training based on initial evaluation findings, risk stratification, comorbidities and participant's personal goals.   Expected Outcomes Achievement of increased cardiorespiratory fitness and enhanced flexibility, muscular endurance and strength shown through measurements of functional capaciy and personal statement of participant.   Lipids Yes  Currently taking niacin for low HDL. Reports his levels have increased to over 50 from in the 20's.   Intervention Provide education and support for participant on nutrition & aerobic/resistive exercise along with prescribed medications to achieve LDL <46m, HDL >463m   Expected Outcomes Short Term: Participant states understanding of desired cholesterol values and is compliant with medications prescribed. Participant is following exercise prescription and nutrition guidelines.;Long Term: Cholesterol controlled with medications as prescribed, with individualized exercise RX and with personalized nutrition plan. Value goals: LDL < 707mHDL > 40 mg.      Core Components/Risk Factors/Patient Goals Review:      Goals and Risk Factor Review      12/10/15 1548 12/28/15 0905         Core Components/Risk Factors/Patient Goals Review   Personal Goals Review Sedentary;Weight  Management/Obesity;Lipids Sedentary;Weight Management/Obesity;Lipids      Review Spoke with Justin [name]. He has been here 5 sessions.  PLans to continue with the program and hopes to see progress with weight loss as he sontinues with the exercise and nutrition plan he is providied. He is compliant with his meds and reports that his HDL is improved to greater than 50 from in the 20;s and that his bad cholseterol is low.  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 He is meeing with the Cardiac Rehab registered dietician soon. He said he lost weight in the past by subsituting a Slim Fast for a meal. He needs to eat something every 2 hours since he gets low blood sugars but he knows when his blood sugar drops. He carries  peanut butter crackers. I encoured him to carry a piece of candy or some sugar.       Expected Outcomes Continue with the exercise and nutrition plan to maintain risk factor control and working toward weight loss. Justin [name] wants to get to 180lbs.          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 He is meeing with the Cardiac Rehab registered dietician soon. He said he lost weight in the past by subsituting a Slim Fast for a meal. He needs to eat something every 2 hours since he gets low blood sugars but he knows when his blood sugar drops. He carries peanut butter crackers. I encoured him to carry a piece of candy or some sugar.    Expected Outcomes Justin [name] wants to get to 180lbs.       ITP Comments:     ITP Comments      11/19/15 1310 11/21/15 0726 01/04/16 1406 01/13/16 0937     ITP Comments Orientation completed.   Initial ITP  Continue with ITP.     30 day review   Orientation is completed to start sessions this week   Continue with ITP 30 day review 30 day review.  Continue with ITP       Comments:

## 2016-01-23 ENCOUNTER — Encounter: Payer: Medicare Other | Admitting: *Deleted

## 2016-01-23 DIAGNOSIS — Z9861 Coronary angioplasty status: Secondary | ICD-10-CM

## 2016-01-23 DIAGNOSIS — I214 Non-ST elevation (NSTEMI) myocardial infarction: Secondary | ICD-10-CM | POA: Diagnosis not present

## 2016-01-23 NOTE — Progress Notes (Signed)
Daily Session Note  Patient Details  Name: Justin [name] MRN: [id number] Date of Birth: Oct 22, 1940 Referring Provider:    Encounter Date: 01/23/2016  Check In:     Session Check In - 01/23/16 0833    Check-In   Location [facility]   Staff Present Heath Lark, RN, BSN, CCRP;Carroll Enterkin, RN, Jana Half, RN, BSN   Supervising physician immediately available to respond to emergencies See telemetry face sheet for immediately available ER MD   Medication changes reported     No   Fall or balance concerns reported    No   Warm-up and Cool-down Performed on first and last piece of equipment   Resistance Training Performed Yes   VAD Patient? No   Pain Assessment   Currently in Pain? No/denies         Goals Met:  Independence with exercise equipment Exercise tolerated well No report of cardiac concerns or symptoms Strength training completed today  Goals Unmet:  Not Applicable  Comments:  Patient completed exercise prescription and all exercise goals during rehab session. The exercise was tolerated well and the patient is progressing in the program.    Dr. [name] is Medical Director for [facility].

## 2016-01-25 ENCOUNTER — Encounter: Payer: Medicare Other | Admitting: *Deleted

## 2016-01-25 DIAGNOSIS — I214 Non-ST elevation (NSTEMI) myocardial infarction: Secondary | ICD-10-CM

## 2016-01-25 NOTE — Progress Notes (Signed)
Daily Session Note  Patient Details  Name: Justin [name] MRN: [id number] Date of Birth: November 04, 1940 Referring Provider:    Encounter Date: 01/25/2016  Check In:     Session Check In - 01/25/16 1007    Check-In   Location [facility]   Staff Present Heath Lark, RN, BSN, CCRP;Tamalyn Wadsworth, RN, Alex Gardener, DPT, CEEA   Supervising physician immediately available to respond to emergencies See telemetry face sheet for immediately available ER MD   Medication changes reported     No   Fall or balance concerns reported    No   Warm-up and Cool-down Performed on first and last piece of equipment   Resistance Training Performed Yes   VAD Patient? No   Pain Assessment   Currently in Pain? No/denies         Goals Met:  Proper associated with RPD/PD & O2 Sat Exercise tolerated well  Goals Unmet:  Not Applicable  Comments:     Dr. [name] is Medical Director for [facility].

## 2016-01-25 NOTE — Patient Instructions (Signed)
Discharge Instructions  Patient Details  Name: Justin [name] MRN: [id number] Date of Birth: Feb 17, 1941 Referring Provider:  Isaias Cowman, MD   Number of Visits:    Reason for Discharge:  Patient reached a stable level of exercise.  Smoking History:  History  Smoking status  . Former Smoker  . Quit date: 06/26/1990  Smokeless tobacco  . Never Used    Diagnosis:  NSTEMI (non-ST elevated myocardial infarction) [facility])  Initial Exercise Prescription:     Initial Exercise Prescription - 11/19/15 1400    Date of Initial Exercise RX and Referring Provider   Date 11/19/15   Treadmill   MPH 2   Grade 0   Minutes 15   Bike   Level 1   Minutes 15   Recumbant Bike   Level 2   Watts 30   NuStep   Level 3   Watts 40   Minutes 15   Arm Ergometer   Level 1   Watts 8   Minutes 15   Arm/Foot Ergometer   Level 1   Watts 10   Minutes 15   Cybex   Level 2   RPM 40   Minutes 15   Recumbant Elliptical   Level 2   Watts 20   Minutes 15   Elliptical   Level 1   Speed 3   Minutes 5   REL-XR   Level 3   Watts 40   Minutes 15   T5 Nustep   Level 2   Watts 20   Minutes 15   Biostep-RELP   Level 3   Watts 40   Minutes 15   Prescription Details   Frequency (times per week) 3   Duration Progress to 30 minutes of continuous aerobic without signs/symptoms of physical distress   Intensity   THRR REST +  30   Ratings of Perceived Exertion 11-15   Perceived Dyspnea 2-4   Progression   Progression Continue progressive overload as per policy without signs/symptoms or physical distress.   Resistance Training   Training Prescription Yes   Weight 2   Reps 10-15      Discharge Exercise Prescription (Final Exercise Prescription Changes):     Exercise Prescription Changes - 12/31/15 0800    Exercise Review   Progression Yes   Response to Exercise   Blood Pressure (Admit) 120/68 mmHg   Blood Pressure (Exercise) 148/82 mmHg   Blood Pressure (Exit) 116/72  mmHg   Heart Rate (Admit) 85 bpm   Heart Rate (Exercise) 112 bpm   Heart Rate (Exit) 71 bpm   Rating of Perceived Exertion (Exercise) 14   Symptoms None   Comments Reveiwed individual exercise prescription with patient. He tolerated it well with no signs or symptoms.    Duration Progress to 45 minutes of aerobic exercise without signs/symptoms of physical distress   Intensity Rest + 30   Progression   Progression Continue progressive overload as per policy without signs/symptoms or physical distress.   Resistance Training   Training Prescription Yes   Weight 4   Reps 10-15   Interval Training   Interval Training Yes   Equipment REL-XR   Comments Interval Training was recommended to the patient to begin with 2 min x 30 second high intensity intervals on the XR. 30 second interval should be done at a 15 RPE.    Treadmill   MPH 3.4   Grade 2   Minutes 25   Bike   Level --  Minutes --   Recumbant Bike   Level --   Watts --   NuStep   Level --   Watts --   Minutes --   Arm Ergometer   Level --   Watts --   Minutes --   Arm/Foot Ergometer   Level --   Watts --   Minutes --   Cybex   Level --   RPM --   Minutes --   Recumbant Elliptical   Level --   Watts --   Minutes --   Elliptical   Level --   Speed --   Minutes --   REL-XR   Level 10   Watts 124   Minutes 20   T5 Nustep   Level --   Watts --   Minutes --   Biostep-RELP   Level --   Watts --   Minutes --      Functional Capacity:     6 Minute Walk      11/19/15 1420       6 Minute Walk   Phase Initial     Distance 1300 feet     Walk Time 6 minutes     # of Rest Breaks 0     RPE 13     Symptoms Yes (comment)     Comments short of breath and leg weakness     Resting HR 60 bpm     Resting BP 112/72 mmHg     Max Ex. HR 84 bpm     Max Ex. BP 132/78 mmHg        Quality of Life:     Quality of Life - 01/21/16 1230    Quality of Life Scores   Health/Function Post 21.6 %    Socioeconomic Post 23.14 %   Psych/Spiritual Post 23.57 %   Family Post 23.5 %   GLOBAL Post 22.6 %      Personal Goals: Goals established at orientation with interventions provided to work toward goal.     Personal Goals and Risk Factors at Admission - 11/19/15 1313    Core Components/Risk Factors/Patient Goals on Admission    Weight Management Yes   Intervention Weight Management: Develop a combined nutrition and exercise program designed to reach desired caloric intake, while maintaining appropriate intake of nutrient and fiber, sodium and fats, and appropriate energy expenditure required for the weight goal.;Weight Management: Provide education and appropriate resources to help participant work on and attain dietary goals.;Weight Management/Obesity: Establish reasonable short term and long term weight goals.;Obesity: Provide education and appropriate resources to help participant work on and attain dietary goals.   Admit Weight 202 lb (91.627 kg)   Goal Weight: Short Term 190 lb (86.183 kg)   Goal Weight: Long Term 180 lb (81.647 kg)   Expected Outcomes Short Term: Continue to assess and modify interventions until short term weight is achieved.;Long Term: Adherence to nutrition and physical activity/exercise program aimed toward attainment of established weight goal.   Sedentary Yes   Intervention Provide advice, education, support and counseling about physical activity/exercise needs.;Develop an individualized exercise prescription for aerobic and resistive training based on initial evaluation findings, risk stratification, comorbidities and participant's personal goals.   Expected Outcomes Achievement of increased cardiorespiratory fitness and enhanced flexibility, muscular endurance and strength shown through measurements of functional capaciy and personal statement of participant.   Lipids Yes  Currently taking niacin for low HDL. Reports his levels have increased to over 50 from in  the  20's.   Intervention Provide education and support for participant on nutrition & aerobic/resistive exercise along with prescribed medications to achieve LDL 70mg , HDL >40mg .   Expected Outcomes Short Term: Participant states understanding of desired cholesterol values and is compliant with medications prescribed. Participant is following exercise prescription and nutrition guidelines.;Long Term: Cholesterol controlled with medications as prescribed, with individualized exercise RX and with personalized nutrition plan. Value goals: LDL < 70mg , HDL > 40 mg.       Personal Goals Discharge:     Goals and Risk Factor Review - 01/23/16 0953    Core Components/Risk Factors/Patient Goals Review   Personal Goals Review Weight Management/Obesity;Sedentary;Lipids   Review Justin [name] reports more energy to do things around the house and activities outside the home since starting cardiac rehab.  He stated, "I can definitely tell a difference since I started Cardiac Rehab."  Justin [name] stated he has not lost as much wieght as he thougth he would have.  He weighed 203.1 on admission to Cardiac Rehab and today he weighs 200 pounds.  In terms of his cholesterol Justin [name] reports having had his cholesterol /lab work drawn this week.  He will see Dr. Sabra Heck on Monday, May 8th.  Justin [name] also stated his meds usually keep his cholesterol where it needs to be.     Expected Outcomes Continue exercising to increase stamina; improve cholesterol;  maintain weight/lose weight; and continue cholesterol medication, diet, and exercise for cholesterol management.        Nutrition & Weight - Outcomes:     Pre Biometrics - 11/19/15 1425    Pre Biometrics   Height 5\' 9"  (1.753 m)   Weight 202 lb 9.6 oz (91.899 kg)   Waist Circumference 44 inches   Hip Circumference 43.5 inches   Waist to Hip Ratio 1.01 %   BMI (Calculated) 30       Nutrition:     Nutrition Therapy & Goals - 01/08/16 1652    Nutrition Therapy   Diet Heart  Healthy dietary guidelines based on 1700 calories   Drug/Food Interactions Statins/Certain Fruits   Protein (specify units) 7   Fiber 30 grams   Whole Grain Foods 3 servings   Saturated Fats 12 max. grams   Fruits and Vegetables 5 servings/day   Sodium 1500 grams   Personal Nutrition Goals   Personal Goal #1 Include more whole grains through 100% whole wheat bread, oatmeal, 1/2 cup brown rice, corn, popcorn   Personal Goal #2 Include at least 7 oz.of a protein food. Refer to list   Personal Goal #3 Include a protein food at breakfast to help avoid low blood sugar.   Personal Goal #4 Add 1-2 servings of fruit per day.   Intervention Plan   Intervention Prescribe, educate and counsel regarding individualized specific dietary modifications aiming towards targeted core components such as weight, hypertension, lipid management, diabetes, heart failure and other comorbidities.;Nutrition handout(s) given to patient.   Expected Outcomes Short Term Goal: A plan has been developed with personal nutrition goals set during dietitian appointment.;Short Term Goal: Understand basic principles of dietary content, such as calories, fat, sodium, cholesterol and nutrients.;Long Term Goal: Adherence to prescribed nutrition plan.      Nutrition Discharge:     Nutrition Assessments - 01/21/16 1230    Rate Your Plate Scores   Post Score 68   Post Score % 75.5 %      Education Questionnaire Score:     Knowledge Questionnaire Score - 01/21/16 1230  Knowledge Questionnaire Score   Post Score 27      Goals reviewed with patient; copy given to patient.

## 2016-01-25 NOTE — Progress Notes (Signed)
Cardiac Individual Treatment Plan  Patient Details  Name: Justin [name] MRN: [id number] Date of Birth: 04-14-1941 Referring Provider:    Initial Encounter Date:       Cardiac Rehab from 11/19/2015 in [facility] Cardiac and Pulmonary Rehab   Date  11/19/15      Visit Diagnosis: NSTEMI (non-ST elevated myocardial infarction) [facility])  Patient's Home Medications on Admission:  Current outpatient prescriptions:  .  acetaminophen (TYLENOL) 325 MG tablet, Take 650 mg by mouth 3 (three) times daily. , Disp: , Rfl:  .  aspirin EC 325 MG EC tablet, Take 1 tablet (325 mg total) by mouth daily., Disp: 30 tablet, Rfl: 0 .  atorvastatin (LIPITOR) 80 MG tablet, Take 40 mg by mouth at bedtime. , Disp: , Rfl:  .  Calcium Carb-Cholecalciferol (CALCIUM 600 + D PO), Take 1 tablet by mouth 2 (two) times daily. Take morning and at noon., Disp: , Rfl:  .  cholecalciferol (VITAMIN D) 1000 units tablet, Take 1,000 Units by mouth 3 (three) times daily., Disp: , Rfl:  .  clopidogrel (PLAVIX) 75 MG tablet, Take 1 tablet (75 mg total) by mouth daily., Disp: 30 tablet, Rfl: 0 .  fexofenadine (ALLEGRA) 180 MG tablet, Take 180 mg by mouth daily before breakfast. , Disp: , Rfl:  .  fluticasone (FLONASE) 50 MCG/ACT nasal spray, Place 2 sprays into both nostrils at bedtime., Disp: , Rfl:  .  isosorbide mononitrate (IMDUR) 60 MG 24 hr tablet, Take 60 mg by mouth daily., Disp: , Rfl:  .  LACTOBACILLUS PO, Take 2 tablets by mouth every morning. , Disp: , Rfl:  .  levofloxacin (LEVAQUIN) 750 MG tablet, Take 1 tablet (750 mg total) by mouth daily. (Patient not taking: Reported on 11/19/2015), Disp: 4 tablet, Rfl: 0 .  Magnesium 400 MG CAPS, Take 400 mg by mouth 2 (two) times daily. , Disp: , Rfl:  .  metoprolol (LOPRESSOR) 50 MG tablet, Take 50 mg by mouth 2 (two) times daily., Disp: , Rfl:  .  niacin 500 MG tablet, Take 500 mg by mouth 3 (three) times daily. Take one tablet in the morning, take one tablet in the afternoon and take one  tablet at night, Disp: , Rfl:  .  nitroGLYCERIN (NITROSTAT) 0.4 MG SL tablet, Place 0.4 mg under the tongue every 5 (five) minutes x 3 doses as needed for chest pain. If no relief call MD or go to emergency room., Disp: , Rfl:  .  Omega-3 Fatty Acids (FISH OIL) 1000 MG CAPS, Take 1,000 mg by mouth 2 (two) times daily., Disp: , Rfl:  .  omeprazole (PRILOSEC) 20 MG capsule, Take 20 mg by mouth 2 (two) times daily before a meal., Disp: , Rfl:  .  senna-docusate (SENOKOT-S) 8.6-50 MG per tablet, Take 2 tablets by mouth 2 (two) times daily. 4 TIMES DAILY, Disp: , Rfl:  .  traMADol (ULTRAM) 50 MG tablet, Take 50 mg by mouth 3 (three) times daily. , Disp: , Rfl:  .  vitamin C (ASCORBIC ACID) 500 MG tablet, Take 500 mg by mouth daily., Disp: , Rfl:  .  Zoledronic Acid (RECLAST IV), Inject into the vein. Injection once yearly, Disp: , Rfl:   Past Medical History: Past Medical History  Diagnosis Date  . Skin cancer   . History of open heart surgery   . CAD (coronary artery disease)   . Arthritis   . DJD (degenerative joint disease)     Tobacco Use: History  Smoking status  .  Former Smoker  . Quit date: 06/26/1990  Smokeless tobacco  . Never Used    Labs: Recent Review Flowsheet Data    Labs for ITP Cardiac and Pulmonary Rehab Latest Ref Rng 11/04/2015   Cholestrol 0 - 200 mg/dL 98   LDLCALC 0 - 99 mg/dL 29   HDL >40 mg/dL 56   Trlycerides <150 mg/dL 64       Exercise Target Goals:    Exercise Program Goal: Individual exercise prescription set with THRR, safety & activity barriers. Participant demonstrates ability to understand and report RPE using BORG scale, to self-measure pulse accurately, and to acknowledge the importance of the exercise prescription.  Exercise Prescription Goal: Starting with aerobic activity 30 plus minutes a day, 3 days per week for initial exercise prescription. Provide home exercise prescription and guidelines that participant acknowledges understanding  prior to discharge.  Activity Barriers & Risk Stratification:     Activity Barriers & Cardiac Risk Stratification - 11/19/15 1328    Activity Barriers & Cardiac Risk Stratification   Activity Barriers Back Problems;Deconditioning  Bulging disc L2 and L3. Usually will not bother with exercise. Most bother when sitting for periods of time.    Cardiac Risk Stratification High      6 Minute Walk:     6 Minute Walk      11/19/15 1420       6 Minute Walk   Phase Initial     Distance 1300 feet     Walk Time 6 minutes     # of Rest Breaks 0     RPE 13     Symptoms Yes (comment)     Comments short of breath and leg weakness     Resting HR 60 bpm     Resting BP 112/72 mmHg     Max Ex. HR 84 bpm     Max Ex. BP 132/78 mmHg        Initial Exercise Prescription:     Initial Exercise Prescription - 11/19/15 1400    Date of Initial Exercise RX and Referring Provider   Date 11/19/15   Treadmill   MPH 2   Grade 0   Minutes 15   Bike   Level 1   Minutes 15   Recumbant Bike   Level 2   Watts 30   NuStep   Level 3   Watts 40   Minutes 15   Arm Ergometer   Level 1   Watts 8   Minutes 15   Arm/Foot Ergometer   Level 1   Watts 10   Minutes 15   Cybex   Level 2   RPM 40   Minutes 15   Recumbant Elliptical   Level 2   Watts 20   Minutes 15   Elliptical   Level 1   Speed 3   Minutes 5   REL-XR   Level 3   Watts 40   Minutes 15   T5 Nustep   Level 2   Watts 20   Minutes 15   Biostep-RELP   Level 3   Watts 40   Minutes 15   Prescription Details   Frequency (times per week) 3   Duration Progress to 30 minutes of continuous aerobic without signs/symptoms of physical distress   Intensity   THRR REST +  30   Ratings of Perceived Exertion 11-15   Perceived Dyspnea 2-4   Progression   Progression Continue progressive overload as per policy without signs/symptoms  or physical distress.   Resistance Training   Training Prescription Yes   Weight 2   Reps  10-15      Perform Capillary Blood Glucose checks as needed.  Exercise Prescription Changes:     Exercise Prescription Changes      11/19/15 1400 12/03/15 1000 12/10/15 0800 12/14/15 0900 12/31/15 0800   Exercise Review   Progression  Yes Yes Yes Yes   Response to Exercise   Blood Pressure (Admit) 112/72 mmHg 130/70 mmHg   120/68 mmHg   Blood Pressure (Exercise) 132/78 mmHg 148/70 mmHg   148/82 mmHg   Blood Pressure (Exit) 122/70 mmHg 110/62 mmHg   116/72 mmHg   Heart Rate (Admit) 60 bpm 65 bpm   85 bpm   Heart Rate (Exercise) 84 bpm 88 bpm   112 bpm   Heart Rate (Exit) 64 bpm 73 bpm   71 bpm   Rating of Perceived Exertion (Exercise) '13 13   14   '$ Symptoms  None None None None   Comments  Reviewed individualized exercise prescription and made increases per departmental policy. Exercise increases were discussed with the patient and they were able to perform the new work loads without issue (no signs or symptoms).  Reviewed individualized exercise prescription and made increases per departmental policy. Exercise increases were discussed with the patient and they were able to perform the new work loads without issue (no signs or symptoms). Patient's weight for the strength training portion was increased. All cardiac intensities remained the same today.  Reviewed individualized exercise prescription and made increases per departmental policy. Exercise increases were discussed with the patient and they were able to perform the new work loads without issue (no signs or symptoms). Patient's weight for the strength training portion was increased. All cardiac intensities remained the same today.  Reveiwed individual exercise prescription with patient. He tolerated it well with no signs or symptoms.    Duration  Progress to 45 minutes of aerobic exercise without signs/symptoms of physical distress Progress to 45 minutes of aerobic exercise without signs/symptoms of physical distress Progress to 45  minutes of aerobic exercise without signs/symptoms of physical distress Progress to 45 minutes of aerobic exercise without signs/symptoms of physical distress   Intensity  Rest + 30 Rest + 30 Rest + 30 Rest + 30   Progression   Progression  Continue progressive overload as per policy without signs/symptoms or physical distress. Continue progressive overload as per policy without signs/symptoms or physical distress. Continue progressive overload as per policy without signs/symptoms or physical distress. Continue progressive overload as per policy without signs/symptoms or physical distress.   Resistance Training   Training Prescription  Yes Yes Yes Yes   Weight  '2 3 4 4   '$ Reps  10-15 10-15 10-15 10-15   Interval Training   Interval Training     Yes   Equipment     REL-XR   Comments     Interval Training was recommended to the patient to begin with 2 min x 30 second high intensity intervals on the XR. 30 second interval should be done at a 15 RPE.    Treadmill   MPH  '3 3 3 '$ 3.4   Grade  0 0 0 2   Minutes  '15 15 15 25   '$ Bike   Level  '1 1 1 '$ --   Minutes  '15 15 15 '$ --   Recumbant Bike   Level  '2 2 2 '$ --   Watts  30  30 30 --   NuStep   Level  '3 3 3 '$ --   Watts  40 40 40 --   Minutes  '15 15 15 '$ --   Arm Ergometer   Level  '1 1 1 '$ --   Watts  '8 8 8 '$ --   Minutes  '15 15 15 '$ --   Arm/Foot Ergometer   Level  '1 1 1 '$ --   Watts  '10 10 10 '$ --   Minutes  '15 15 15 '$ --   Cybex   Level  '2 2 2 '$ --   RPM  40 40 40 --   Minutes  '15 15 15 '$ --   Recumbant Elliptical   Level  '2 2 2 '$ --   Watts  '20 20 20 '$ --   Minutes  '15 15 15 '$ --   Elliptical   Level  '1 1 1 '$ --   Speed  '3 3 3 '$ --   Minutes  '5 5 5 '$ --   REL-XR   Level  '3 3 3 10   '$ Watts  40 40 40 124   Minutes  '15 15 15 20   '$ T5 Nustep   Level  '2 2 2 '$ --   Watts  '20 20 20 '$ --   Minutes  '15 15 15 '$ --   Biostep-RELP   Level  '3 3 3 '$ --   Watts  40 40 40 --   Minutes  '15 15 15 '$ --      Exercise Comments:     Exercise Comments      12/14/15 0916 12/31/15  1154         Exercise Comments [name] that have helped him recently since he felt the Nustep hurt his back a little back the other day. He liked the recumbent elliptical today.  Justin [name] stated that he has noticed an increase in energy in doing daily activities. He has been able to do some yard work this week without symptoms. He enjoys woodworking and building and has been able to get back to doing that sometimes.          Discharge Exercise Prescription (Final Exercise Prescription Changes):     Exercise Prescription Changes - 12/31/15 0800    Exercise Review   Progression Yes   Response to Exercise   Blood Pressure (Admit) 120/68 mmHg   Blood Pressure (Exercise) 148/82 mmHg   Blood Pressure (Exit) 116/72 mmHg   Heart Rate (Admit) 85 bpm   Heart Rate (Exercise) 112 bpm   Heart Rate (Exit) 71 bpm   Rating of Perceived Exertion (Exercise) 14   Symptoms None   Comments Reveiwed individual exercise prescription with patient. He tolerated it well with no signs or symptoms.    Duration Progress to 45 minutes of aerobic exercise without signs/symptoms of physical distress   Intensity Rest + 30   Progression   Progression Continue progressive overload as per policy without signs/symptoms or physical distress.   Resistance Training   Training Prescription Yes   Weight 4   Reps 10-15   Interval Training   Interval Training Yes   Equipment REL-XR   Comments Interval Training was recommended to the patient to begin with 2 min x 30 second high intensity intervals on the XR. 30 second interval should be done at a 15 RPE.    Treadmill   MPH 3.4   Grade 2   Minutes 25   Bike   Level --  Minutes --   Recumbant Bike   Level --   Watts --   NuStep   Level --   Watts --   Minutes --   Arm Ergometer   Level --   Watts --   Minutes --   Arm/Foot Ergometer   Level --   Watts --   Minutes --   Cybex   Level --   RPM --   Minutes --    Recumbant Elliptical   Level --   Watts --   Minutes --   Elliptical   Level --   Speed --   Minutes --   REL-XR   Level 10   Watts 124   Minutes 20   T5 Nustep   Level --   Watts --   Minutes --   Biostep-RELP   Level --   Watts --   Minutes --      Nutrition:  Target Goals: Understanding of nutrition guidelines, daily intake of sodium '1500mg'$ , cholesterol '200mg'$ , calories 30% from fat and 7% or less from saturated fats, daily to have 5 or more servings of fruits and vegetables.  Biometrics:     Pre Biometrics - 11/19/15 1425    Pre Biometrics   Height '5\' 9"'$  (1.753 m)   Weight 202 lb 9.6 oz (91.899 kg)   Waist Circumference 44 inches   Hip Circumference 43.5 inches   Waist to Hip Ratio 1.01 %   BMI (Calculated) 30       Nutrition Therapy Plan and Nutrition Goals:     Nutrition Therapy & Goals - 01/08/16 1652    Nutrition Therapy   Diet Heart Healthy dietary guidelines based on 1700 calories   Drug/Food Interactions Statins/Certain Fruits   Protein (specify units) 7   Fiber 30 grams   Whole Grain Foods 3 servings   Saturated Fats 12 max. grams   Fruits and Vegetables 5 servings/day   Sodium 1500 grams   Personal Nutrition Goals   Personal Goal #1 Include more whole grains through 100% whole wheat bread, oatmeal, 1/2 cup brown rice, corn, popcorn   Personal Goal #2 Include at least 7 oz.of a protein food. Refer to list   Personal Goal #3 Include a protein food at breakfast to help avoid low blood sugar.   Personal Goal #4 Add 1-2 servings of fruit per day.   Intervention Plan   Intervention Prescribe, educate and counsel regarding individualized specific dietary modifications aiming towards targeted core components such as weight, hypertension, lipid management, diabetes, heart failure and other comorbidities.;Nutrition handout(s) given to patient.   Expected Outcomes Short Term Goal: A plan has been developed with personal nutrition goals set during  dietitian appointment.;Short Term Goal: Understand basic principles of dietary content, such as calories, fat, sodium, cholesterol and nutrients.;Long Term Goal: Adherence to prescribed nutrition plan.      Nutrition Discharge: Rate Your Plate Scores:     Nutrition Assessments - 01/21/16 1230    Rate Your Plate Scores   Post Score 68   Post Score % 75.5 %      Nutrition Goals Re-Evaluation:     Nutrition Goals Re-Evaluation      12/28/15 0913           Personal Goal #1 Re-Evaluation   Personal Goal #1 Trying to lose weight. Has low blood sugars so needs to eat small meals or something every 2 hours.        Goal Progress Seen Yes  Psychosocial: Target Goals: Acknowledge presence or absence of depression, maximize coping skills, provide positive support system. Participant is able to verbalize types and ability to use techniques and skills needed for reducing stress and depression.  Initial Review & Psychosocial Screening:     Initial Psych Review & Screening - 11/19/15 1320    Family Dynamics   Good Support System? Yes  "My wife is fantastic". Has a daughter that is available .   Barriers   Psychosocial barriers to participate in program There are no identifiable barriers or psychosocial needs.;The patient should benefit from training in stress management and relaxation.   Screening Interventions   Interventions Encouraged to exercise      Quality of Life Scores:     Quality of Life - 01/21/16 1230    Quality of Life Scores   Health/Function Post 21.6 %   Socioeconomic Post 23.14 %   Psych/Spiritual Post 23.57 %   Family Post 23.5 %   GLOBAL Post 22.6 %      PHQ-9:     Recent Review Flowsheet Data    Depression screen [facility] 2/9 11/19/2015   Decreased Interest 0   Down, Depressed, Hopeless 0   PHQ - 2 Score 0   Altered sleeping 0   Tired, decreased energy 3   Change in appetite 3   Feeling bad or failure about yourself  0   Trouble  concentrating 0   Moving slowly or fidgety/restless 0   Suicidal thoughts 0   PHQ-9 Score 6   Difficult doing work/chores Somewhat difficult       Psychosocial Evaluation and Intervention:     Psychosocial Evaluation - 11/21/15 0935    Psychosocial Evaluation & Interventions   Interventions Encouraged to exercise with the program and follow exercise prescription   Comments Counselor met with Justin [name] today for initial psychosocial evaluation.  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  He has a strong support system with a spouse of 70 years and a daughter who lives close by.  He also is actively involved in his local church community.  Justin [name] has other medical issues that impact his health, including severe osteoporsis and sleep apnea.  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  Justin [name] has a history of sleep apnea and states he uses a ventilator at night.  He has a good appetite and denies a history or current symptoms of depresion or anxiety.  Justin [name] states he is typically in a positive mood and has minimal stress in his life at this time.  His goals for this program are to breathe better and lose some weight.  He has a treadmill at home and plans to begin using that and join [employer] following completion of this program.        Psychosocial Re-Evaluation:     Psychosocial Re-Evaluation      12/28/15 0910 12/31/15 0942         Psychosocial Re-Evaluation   Interventions Encouraged to attend Cardiac Rehabilitation for the exercise       Comments Justin [name] said his brother has moved in with him afer his brother was in the CCU the same time Justin [name] was for his heart attack. His brother went to phyiscal therapy rehab then came to live with them with home health due to his CHF. Gave info to give to his brother about attending CR after home health is done.  Counselor  follow up with Justin [name] today reporting feeling stronger,  breathing better and sleeping better - other than his back hurting - since he came into this program.  Counselor and Justin [name] discussed his options once he completes this program with Silver Sneakers being an option for consistent exercise.  Justin [name] continues to maintain a positive mood.  Counselor commended him for all of his hard work and progress already made in this program.           Vocational Rehabilitation: Provide vocational rehab assistance to qualifying candidates.   Vocational Rehab Evaluation & Intervention:     Vocational Rehab - 11/19/15 1332    Initial Vocational Rehab Evaluation & Intervention   Assessment shows need for Vocational Rehabilitation No      Education: Education Goals: Education classes will be provided on a weekly basis, covering required topics. Participant will state understanding/return demonstration of topics presented.  Learning Barriers/Preferences:     Learning Barriers/Preferences - 11/19/15 1333    Learning Barriers/Preferences   Learning Barriers Hearing   Learning Preferences None      Education Topics: General Nutrition Guidelines/Fats and Fiber: -Group instruction provided by verbal, written material, models and posters to present the general guidelines for heart healthy nutrition. Gives an explanation and review of dietary fats and fiber.   Controlling Sodium/Reading Food Labels: -Group verbal and written material supporting the discussion of sodium use in heart healthy nutrition. Review and explanation with models, verbal and written materials for utilization of the food label.          Cardiac Rehab from 01/23/2016 in [facility] Cardiac and Pulmonary Rehab   Date  12/31/15   Educator  C. Joneen Caraway, RD   Instruction Review Code  2- meets goals/outcomes      Exercise Physiology & Risk Factors: - Group verbal and written instruction with models to review the exercise physiology of the cardiovascular system and associated critical  values. Details cardiovascular disease risk factors and the goals associated with each risk factor.      Cardiac Rehab from 01/23/2016 in [facility] Cardiac and Pulmonary Rehab   Date  01/09/16   Educator  BS   Instruction Review Code  2- meets goals/outcomes      Aerobic Exercise & Resistance Training: - Gives group verbal and written discussion on the health impact of inactivity. On the components of aerobic and resistive training programs and the benefits of this training and how to safely progress through these programs.      Cardiac Rehab from 01/23/2016 in [facility] Cardiac and Pulmonary Rehab   Date  01/14/16   Educator  RS   Instruction Review Code  2- meets goals/outcomes      Flexibility, Balance, General Exercise Guidelines: - Provides group verbal and written instruction on the benefits of flexibility and balance training programs. Provides general exercise guidelines with specific guidelines to those with heart or lung disease. Demonstration and skill practice provided.      Cardiac Rehab from 01/23/2016 in [facility] Cardiac and Pulmonary Rehab   Date  01/16/16   Educator  bs   Instruction Review Code  2- meets goals/outcomes      Stress Management: - Provides group verbal and written instruction about the health risks of elevated stress, cause of high stress, and healthy ways to reduce stress.      Cardiac Rehab from 01/23/2016 in [facility] Cardiac and Pulmonary Rehab   Date  01/23/16   Educator  [facility]   Instruction Review Code  2- meets goals/outcomes      Depression: - Provides group verbal and written instruction on the correlation between heart/lung disease and depressed mood, treatment options, and the stigmas associated with seeking treatment.      Cardiac Rehab from 01/23/2016 in [facility] Cardiac and Pulmonary Rehab   Date  12/26/15   Educator  Lucianne Lei, MSW   Instruction Review Code  2- meets goals/outcomes      Anatomy & Physiology of the Heart: - Group verbal and written  instruction and models provide basic cardiac anatomy and physiology, with the coronary electrical and arterial systems. Review of: AMI, Angina, Valve disease, Heart Failure, Cardiac Arrhythmia, Pacemakers, and the ICD.      Cardiac Rehab from 01/23/2016 in [facility] Cardiac and Pulmonary Rehab   Date  01/21/16   Educator  SB   Instruction Review Code  2- meets goals/outcomes      Cardiac Procedures: - Group verbal and written instruction and models to describe the testing methods done to diagnose heart disease. Reviews the outcomes of the test results. Describes the treatment choices: Medical Management, Angioplasty, or Coronary Bypass Surgery.      Cardiac Rehab from 01/23/2016 in [facility] Cardiac and Pulmonary Rehab   Date  12/10/15   Educator  SB   Instruction Review Code  2- meets goals/outcomes      Cardiac Medications: - Group verbal and written instruction to review commonly prescribed medications for heart disease. Reviews the medication, class of the drug, and side effects. Includes the steps to properly store meds and maintain the prescription regimen.   Go Sex-Intimacy & Heart Disease, Get SMART - Goal Setting: - Group verbal and written instruction through game format to discuss heart disease and the return to sexual intimacy. Provides group verbal and written material to discuss and apply goal setting through the application of the S.M.A.R.T. Method.      Cardiac Rehab from 01/23/2016 in [facility] Cardiac and Pulmonary Rehab   Date  12/10/15   Educator  SB   Instruction Review Code  2- meets goals/outcomes      Other Matters of the Heart: - Provides group verbal, written materials and models to describe Heart Failure, Angina, Valve Disease, and Diabetes in the realm of heart disease. Includes description of the disease process and treatment options available to the cardiac patient.      Cardiac Rehab from 01/23/2016 in [facility] Cardiac and Pulmonary Rehab   Date  01/21/16   Educator  SB    Instruction Review Code  2- meets goals/outcomes      Exercise & Equipment Safety: - Individual verbal instruction and demonstration of equipment use and safety with use of the equipment.      Cardiac Rehab from 01/23/2016 in [facility] Cardiac and Pulmonary Rehab   Date  11/19/15   Educator  SB   Instruction Review Code  2- meets goals/outcomes      Infection Prevention: - Provides verbal and written material to individual with discussion of infection control including proper hand washing and proper equipment cleaning during exercise session.      Cardiac Rehab from 01/23/2016 in [facility] Cardiac and Pulmonary Rehab   Date  11/19/15   Educator  Sb   Instruction Review Code  2- meets goals/outcomes      Falls Prevention: - Provides verbal and written material to individual with discussion of falls prevention and safety.      Cardiac Rehab from 01/23/2016 in [facility] Cardiac and Pulmonary Rehab  Date  11/19/15   Educator  SB   Instruction Review Code  2- meets goals/outcomes      Diabetes: - Individual verbal and written instruction to review signs/symptoms of diabetes, desired ranges of glucose level fasting, after meals and with exercise. Advice that pre and post exercise glucose checks will be done for 3 sessions at entry of program.    Knowledge Questionnaire Score:     Knowledge Questionnaire Score - 01/21/16 1230    Knowledge Questionnaire Score   Post Score 27      Core Components/Risk Factors/Patient Goals at Admission:     Personal Goals and Risk Factors at Admission - 11/19/15 1313    Core Components/Risk Factors/Patient Goals on Admission    Weight Management Yes   Intervention Weight Management: Develop a combined nutrition and exercise program designed to reach desired caloric intake, while maintaining appropriate intake of nutrient and fiber, sodium and fats, and appropriate energy expenditure required for the weight goal.;Weight Management: Provide education and  appropriate resources to help participant work on and attain dietary goals.;Weight Management/Obesity: Establish reasonable short term and long term weight goals.;Obesity: Provide education and appropriate resources to help participant work on and attain dietary goals.   Admit Weight 202 lb (91.627 kg)   Goal Weight: Short Term 190 lb (86.183 kg)   Goal Weight: Long Term 180 lb (81.647 kg)   Expected Outcomes Short Term: Continue to assess and modify interventions until short term weight is achieved.;Long Term: Adherence to nutrition and physical activity/exercise program aimed toward attainment of established weight goal.   Sedentary Yes   Intervention Provide advice, education, support and counseling about physical activity/exercise needs.;Develop an individualized exercise prescription for aerobic and resistive training based on initial evaluation findings, risk stratification, comorbidities and participant's personal goals.   Expected Outcomes Achievement of increased cardiorespiratory fitness and enhanced flexibility, muscular endurance and strength shown through measurements of functional capaciy and personal statement of participant.   Lipids Yes  Currently taking niacin for low HDL. Reports his levels have increased to over 50 from in the 20's.   Intervention Provide education and support for participant on nutrition & aerobic/resistive exercise along with prescribed medications to achieve LDL '70mg'$ , HDL >'40mg'$ .   Expected Outcomes Short Term: Participant states understanding of desired cholesterol values and is compliant with medications prescribed. Participant is following exercise prescription and nutrition guidelines.;Long Term: Cholesterol controlled with medications as prescribed, with individualized exercise RX and with personalized nutrition plan. Value goals: LDL < '70mg'$ , HDL > 40 mg.      Core Components/Risk Factors/Patient Goals Review:      Goals and Risk Factor Review       12/10/15 1548 12/28/15 0905 01/23/16 0953       Core Components/Risk Factors/Patient Goals Review   Personal Goals Review Sedentary;Weight Management/Obesity;Lipids Sedentary;Weight Management/Obesity;Lipids Weight Management/Obesity;Sedentary;Lipids     Review Spoke with Justin [name] today. He has been here 5 sessions.  PLans to continue with the program and hopes to see progress with weight loss as he sontinues with the exercise and nutrition plan he is providied. He is compliant with his meds and reports that his HDL is improved to greater than 50 from in the 20;s and that his bad cholseterol is low.  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 He is meeing with the Cardiac Rehab registered dietician soon. He said he lost weight in the past by subsituting a Slim Fast for a  meal. He needs to eat something every 2 hours since he gets low blood sugars but he knows when his blood sugar drops. He carries peanut butter crackers. I encoured him to carry a piece of candy or some sugar.  Justin [name] reports more energy to do things around the house and activities outside the home since starting cardiac rehab.  He stated, "I can definitely tell a difference since I started Cardiac Rehab."  Justin [name] stated he has not lost as much wieght as he thougth he would have.  He weighed 203.1 on admission to Cardiac Rehab and today he weighs 200 pounds.  In terms of his cholesterol Justin [name] reports having had his cholesterol /lab work drawn this week.  He will see Dr. Sabra Heck on Monday, May 8th.  Justin [name] also stated his meds usually keep his cholesterol where it needs to be.       Expected Outcomes Continue with the exercise and nutrition plan to maintain risk factor control and working toward weight loss. Friend wants to get to 180lbs.  Continue exercising to increase stamina; improve cholesterol;  maintain weight/lose weight; and continue cholesterol medication, diet, and exercise for  cholesterol management.          Core Components/Risk Factors/Patient Goals at Discharge (Final Review):      Goals and Risk Factor Review - 01/23/16 0953    Core Components/Risk Factors/Patient Goals Review   Personal Goals Review Weight Management/Obesity;Sedentary;Lipids   Review Justin [name] reports more energy to do things around the house and activities outside the home since starting cardiac rehab.  He stated, "I can definitely tell a difference since I started Cardiac Rehab."  Justin [name] stated he has not lost as much wieght as he thougth he would have.  He weighed 203.1 on admission to Cardiac Rehab and today he weighs 200 pounds.  In terms of his cholesterol Justin [name] reports having had his cholesterol /lab work drawn this week.  He will see Dr. Sabra Heck on Monday, May 8th.  Justin [name] also stated his meds usually keep his cholesterol where it needs to be.     Expected Outcomes Continue exercising to increase stamina; improve cholesterol;  maintain weight/lose weight; and continue cholesterol medication, diet, and exercise for cholesterol management.        ITP Comments:     ITP Comments      11/19/15 1310 11/21/15 0726 01/04/16 1406 01/13/16 0937     ITP Comments Orientation completed.  Initial ITP  Continue with ITP.     30 day review   Orientation is completed to start sessions this week   Continue with ITP 30 day review 30 day review.  Continue with ITP       Comments:

## 2016-01-30 ENCOUNTER — Encounter: Payer: Medicare Other | Admitting: *Deleted

## 2016-01-30 DIAGNOSIS — I214 Non-ST elevation (NSTEMI) myocardial infarction: Secondary | ICD-10-CM | POA: Diagnosis not present

## 2016-01-30 DIAGNOSIS — Z9861 Coronary angioplasty status: Secondary | ICD-10-CM

## 2016-01-30 NOTE — Progress Notes (Signed)
Cardiac Individual Treatment Plan  Patient Details  Name: Justin [name] MRN: [id number] Date of Birth: Oct 01, 1940 Referring Provider:    Initial Encounter Date:       Cardiac Rehab from 11/19/2015 in [facility] Cardiac and Pulmonary Rehab   Date  11/19/15      Visit Diagnosis: NSTEMI (non-ST elevated myocardial infarction) ([place])  S/P PTCA (percutaneous transluminal coronary angioplasty)  Patient's Home Medications on Admission:  Current outpatient prescriptions:  .  acetaminophen (TYLENOL) 325 MG tablet, Take 650 mg by mouth 3 (three) times daily. , Disp: , Rfl:  .  aspirin EC 325 MG EC tablet, Take 1 tablet (325 mg total) by mouth daily., Disp: 30 tablet, Rfl: 0 .  atorvastatin (LIPITOR) 80 MG tablet, Take 40 mg by mouth at bedtime. , Disp: , Rfl:  .  Calcium Carb-Cholecalciferol (CALCIUM 600 + D PO), Take 1 tablet by mouth 2 (two) times daily. Take morning and at noon., Disp: , Rfl:  .  cholecalciferol (VITAMIN D) 1000 units tablet, Take 1,000 Units by mouth 3 (three) times daily., Disp: , Rfl:  .  clopidogrel (PLAVIX) 75 MG tablet, Take 1 tablet (75 mg total) by mouth daily., Disp: 30 tablet, Rfl: 0 .  fexofenadine (ALLEGRA) 180 MG tablet, Take 180 mg by mouth daily before breakfast. , Disp: , Rfl:  .  fluticasone (FLONASE) 50 MCG/ACT nasal spray, Place 2 sprays into both nostrils at bedtime., Disp: , Rfl:  .  isosorbide mononitrate (IMDUR) 60 MG 24 hr tablet, Take 60 mg by mouth daily., Disp: , Rfl:  .  LACTOBACILLUS PO, Take 2 tablets by mouth every morning. , Disp: , Rfl:  .  levofloxacin (LEVAQUIN) 750 MG tablet, Take 1 tablet (750 mg total) by mouth daily. (Patient not taking: Reported on 11/19/2015), Disp: 4 tablet, Rfl: 0 .  Magnesium 400 MG CAPS, Take 400 mg by mouth 2 (two) times daily. , Disp: , Rfl:  .  metoprolol (LOPRESSOR) 50 MG tablet, Take 50 mg by mouth 2 (two) times daily., Disp: , Rfl:  .  niacin 500 MG tablet, Take 500 mg by mouth 3 (three) times daily. Take one tablet  in the morning, take one tablet in the afternoon and take one tablet at night, Disp: , Rfl:  .  nitroGLYCERIN (NITROSTAT) 0.4 MG SL tablet, Place 0.4 mg under the tongue every 5 (five) minutes x 3 doses as needed for chest pain. If no relief call MD or go to emergency room., Disp: , Rfl:  .  Omega-3 Fatty Acids (FISH OIL) 1000 MG CAPS, Take 1,000 mg by mouth 2 (two) times daily., Disp: , Rfl:  .  omeprazole (PRILOSEC) 20 MG capsule, Take 20 mg by mouth 2 (two) times daily before a meal., Disp: , Rfl:  .  senna-docusate (SENOKOT-S) 8.6-50 MG per tablet, Take 2 tablets by mouth 2 (two) times daily. 4 TIMES DAILY, Disp: , Rfl:  .  traMADol (ULTRAM) 50 MG tablet, Take 50 mg by mouth 3 (three) times daily. , Disp: , Rfl:  .  vitamin C (ASCORBIC ACID) 500 MG tablet, Take 500 mg by mouth daily., Disp: , Rfl:  .  Zoledronic Acid (RECLAST IV), Inject into the vein. Injection once yearly, Disp: , Rfl:   Past Medical History: Past Medical History  Diagnosis Date  . Skin cancer   . History of open heart surgery   . CAD (coronary artery disease)   . Arthritis   . DJD (degenerative joint disease)  Tobacco Use: History  Smoking status  . Former Smoker  . Quit date: 06/26/1990  Smokeless tobacco  . Never Used    Labs: Recent Review Flowsheet Data    Labs for ITP Cardiac and Pulmonary Rehab Latest Ref Rng 11/04/2015   Cholestrol 0 - 200 mg/dL 98   LDLCALC 0 - 99 mg/dL 29   HDL >40 mg/dL 56   Trlycerides <150 mg/dL 64       Exercise Target Goals:    Exercise Program Goal: Individual exercise prescription set with THRR, safety & activity barriers. Participant demonstrates ability to understand and report RPE using BORG scale, to self-measure pulse accurately, and to acknowledge the importance of the exercise prescription.  Exercise Prescription Goal: Starting with aerobic activity 30 plus minutes a day, 3 days per week for initial exercise prescription. Provide home exercise prescription  and guidelines that participant acknowledges understanding prior to discharge.  Activity Barriers & Risk Stratification:     Activity Barriers & Cardiac Risk Stratification - 11/19/15 1328    Activity Barriers & Cardiac Risk Stratification   Activity Barriers Back Problems;Deconditioning  Bulging disc L2 and L3. Usually will not bother with exercise. Most bother when sitting for periods of time.    Cardiac Risk Stratification High      6 Minute Walk:     6 Minute Walk      11/19/15 1420 01/30/16 1251 01/30/16 1349   6 Minute Walk   Phase Initial (p) Discharge    Distance 1300 feet  1710 feet   Distance % Change   32 %   Walk Time 6 minutes  6 minutes   # of Rest Breaks 0  0   RPE 13  14   Symptoms Yes (comment)  No   Comments short of breath and leg weakness     Resting HR 60 bpm  59 bpm   Resting BP 112/72 mmHg  104/62 mmHg   Max Ex. HR 84 bpm  95 bpm   Max Ex. BP 132/78 mmHg  154/70 mmHg      Initial Exercise Prescription:     Initial Exercise Prescription - 11/19/15 1400    Date of Initial Exercise RX and Referring Provider   Date 11/19/15   Treadmill   MPH 2   Grade 0   Minutes 15   Bike   Level 1   Minutes 15   Recumbant Bike   Level 2   Watts 30   NuStep   Level 3   Watts 40   Minutes 15   Arm Ergometer   Level 1   Watts 8   Minutes 15   Arm/Foot Ergometer   Level 1   Watts 10   Minutes 15   Cybex   Level 2   RPM 40   Minutes 15   Recumbant Elliptical   Level 2   Watts 20   Minutes 15   Elliptical   Level 1   Speed 3   Minutes 5   REL-XR   Level 3   Watts 40   Minutes 15   T5 Nustep   Level 2   Watts 20   Minutes 15   Biostep-RELP   Level 3   Watts 40   Minutes 15   Prescription Details   Frequency (times per week) 3   Duration Progress to 30 minutes of continuous aerobic without signs/symptoms of physical distress   Intensity   THRR REST +  30  Ratings of Perceived Exertion 11-15   Perceived Dyspnea 2-4   Progression    Progression Continue progressive overload as per policy without signs/symptoms or physical distress.   Resistance Training   Training Prescription Yes   Weight 2   Reps 10-15      Perform Capillary Blood Glucose checks as needed.  Exercise Prescription Changes:     Exercise Prescription Changes      11/19/15 1400 12/03/15 1000 12/10/15 0800 12/14/15 0900 12/31/15 0800   Exercise Review   Progression  Yes Yes Yes Yes   Response to Exercise   Blood Pressure (Admit) 112/72 mmHg 130/70 mmHg   120/68 mmHg   Blood Pressure (Exercise) 132/78 mmHg 148/70 mmHg   148/82 mmHg   Blood Pressure (Exit) 122/70 mmHg 110/62 mmHg   116/72 mmHg   Heart Rate (Admit) 60 bpm 65 bpm   85 bpm   Heart Rate (Exercise) 84 bpm 88 bpm   112 bpm   Heart Rate (Exit) 64 bpm 73 bpm   71 bpm   Rating of Perceived Exertion (Exercise) _0 Symptoms  None None None None   Comments  Reviewed individualized exercise prescription and made increases per departmental policy. Exercise increases were discussed with the patient and they were able to perform the new work loads without issue (no signs or symptoms).  Reviewed individualized exercise prescription and made increases per departmental policy. Exercise increases were discussed with the patient and they were able to perform the new work loads without issue (no signs or symptoms). Patient's weight for the strength training portion was increased. All cardiac intensities remained the same today.  Reviewed individualized exercise prescription and made increases per departmental policy. Exercise increases were discussed with the patient and they were able to perform the new work loads without issue (no signs or symptoms). Patient's weight for the strength training portion was increased. All cardiac intensities remained the same today.  Reveiwed individual exercise prescription with patient. He tolerated it well with no signs or symptoms.    Duration  Progress to 45  minutes of aerobic exercise without signs/symptoms of physical distress Progress to 45 minutes of aerobic exercise without signs/symptoms of physical distress Progress to 45 minutes of aerobic exercise without signs/symptoms of physical distress Progress to 45 minutes of aerobic exercise without signs/symptoms of physical distress   Intensity  Rest + 30 Rest + 30 Rest + 30 Rest + 30   Progression   Progression  Continue progressive overload as per policy without signs/symptoms or physical distress. Continue progressive overload as per policy without signs/symptoms or physical distress. Continue progressive overload as per policy without signs/symptoms or physical distress. Continue progressive overload as per policy without signs/symptoms or physical distress.   Resistance Training   Training Prescription  Yes Yes Yes Yes   Weight  _1 Reps  10-15 10-15 10-15 10-15   Interval Training   Interval Training     Yes   Equipment     REL-XR   Comments     Interval Training was recommended to the patient to begin with 2 min x 30 second high intensity intervals on the XR. 30 second interval should be done at a 15 RPE.    Treadmill   MPH  _2 3.4   Grade  0 0 0 2   Minutes  _3 Bike   Level  _4 --  Minutes  _0 --   Recumbant Bike   Level  _1 --   Watts  _2 --   NuStep   Level  _3 --   Watts  40 40 40 --   Minutes  _4 --   Arm Ergometer   Level  _5 --   Watts  _6 --   Minutes  _7 --   Arm/Foot Ergometer   Level  _8 --   Watts  _9 --   Minutes  _10 --   Cybex   Level  _11 --   RPM  40 40 40 --   Minutes  _12 --   Recumbant Elliptical   Level  _13 --   Watts  _14 --   Minutes  _15 --   Elliptical   Level  _16 --   Speed  _17 --   Minutes  _18 --   REL-XR   Level  _19 Watts  40 40 40 124   Minutes  _20 T5 Nustep   Level  _21 --   Watts  _22 --   Minutes  _23 --   Biostep-RELP   Level  _24 --   Watts  40 40 40 --   Minutes  _25 --      Exercise Comments:     Exercise Comments      12/14/15 0916 12/31/15 1154         Exercise Comments [name] that have helped him recently since he felt the Nustep hurt his back a little back the other day. He liked the recumbent elliptical today.  Justin [name] stated that he has noticed an increase in energy in doing daily activities. He has been able to do some yard work this week without symptoms. He enjoys woodworking and building and has been able to get back to doing that sometimes.          Discharge Exercise Prescription (Final Exercise Prescription Changes):     Exercise Prescription Changes - 12/31/15 0800    Exercise Review   Progression Yes   Response to Exercise   Blood Pressure (Admit) 120/68 mmHg   Blood Pressure (Exercise) 148/82 mmHg   Blood Pressure (Exit) 116/72 mmHg   Heart Rate (Admit) 85 bpm   Heart Rate (Exercise) 112 bpm   Heart Rate (Exit) 71 bpm   Rating of Perceived Exertion (Exercise) 14   Symptoms None   Comments Reveiwed individual exercise prescription with patient. He tolerated it well with no signs or symptoms.    Duration Progress to 45 minutes of aerobic exercise without signs/symptoms of physical distress   Intensity Rest + 30   Progression   Progression Continue progressive overload as per policy without signs/symptoms or physical distress.   Resistance Training   Training Prescription Yes   Weight 4   Reps 10-15   Interval Training   Interval Training Yes   Equipment REL-XR   Comments Interval Training was recommended to the patient to begin with 2 min x 30 second high intensity intervals on the XR. 30 second interval should be done at a 15 RPE.  Treadmill   MPH 3.4   Grade 2   Minutes 25   Bike   Level --   Minutes --   Recumbant Bike   Level --   Watts --   NuStep   Level --   Watts --    Minutes --   Arm Ergometer   Level --   Watts --   Minutes --   Arm/Foot Ergometer   Level --   Watts --   Minutes --   Cybex   Level --   RPM --   Minutes --   Recumbant Elliptical   Level --   Watts --   Minutes --   Elliptical   Level --   Speed --   Minutes --   REL-XR   Level 10   Watts 124   Minutes 20   T5 Nustep   Level --   Watts --   Minutes --   Biostep-RELP   Level --   Watts --   Minutes --      Nutrition:  Target Goals: Understanding of nutrition guidelines, daily intake of sodium <1532m, cholesterol <2016m calories 30% from fat and 7% or less from saturated fats, daily to have 5 or more servings of fruits and vegetables.  Biometrics:     Pre Biometrics - 11/19/15 1425    Pre Biometrics   Height _0  (1.753 m)   Weight 202 lb 9.6 oz (91.899 kg)   Waist Circumference 44 inches   Hip Circumference 43.5 inches   Waist to Hip Ratio 1.01 %   BMI (Calculated) 30       Nutrition Therapy Plan and Nutrition Goals:     Nutrition Therapy & Goals - 01/08/16 1652    Nutrition Therapy   Diet Heart Healthy dietary guidelines based on 1700 calories   Drug/Food Interactions Statins/Certain Fruits   Protein (specify units) 7   Fiber 30 grams   Whole Grain Foods 3 servings   Saturated Fats 12 max. grams   Fruits and Vegetables 5 servings/day   Sodium 1500 grams   Personal Nutrition Goals   Personal Goal #1 Include more whole grains through 100% whole wheat bread, oatmeal, 1/2 cup brown rice, corn, popcorn   Personal Goal #2 Include at least 7 oz.of a protein food. Refer to list   Personal Goal #3 Include a protein food at breakfast to help avoid low blood sugar.   Personal Goal #4 Add 1-2 servings of fruit per day.   Intervention Plan   Intervention Prescribe, educate and counsel regarding individualized specific dietary modifications aiming towards targeted core components such as weight, hypertension, lipid management, diabetes, heart failure  and other comorbidities.;Nutrition handout(s) given to patient.   Expected Outcomes Short Term Goal: A plan has been developed with personal nutrition goals set during dietitian appointment.;Short Term Goal: Understand basic principles of dietary content, such as calories, fat, sodium, cholesterol and nutrients.;Long Term Goal: Adherence to prescribed nutrition plan.      Nutrition Discharge: Rate Your Plate Scores:     Nutrition Assessments - 01/21/16 1230    Rate Your Plate Scores   Post Score 68   Post Score % 75.5 %      Nutrition Goals Re-Evaluation:     Nutrition Goals Re-Evaluation      12/28/15 0913           Personal Goal #1 Re-Evaluation   Personal Goal #1 Trying to lose weight. Has low blood sugars so needs to  eat small meals or something every 2 hours.        Goal Progress Seen Yes          Psychosocial: Target Goals: Acknowledge presence or absence of depression, maximize coping skills, provide positive support system. Participant is able to verbalize types and ability to use techniques and skills needed for reducing stress and depression.  Initial Review & Psychosocial Screening:     Initial Psych Review & Screening - 11/19/15 1320    Family Dynamics   Good Support System? Yes  "My wife is fantastic". Has a daughter that is available .   Barriers   Psychosocial barriers to participate in program There are no identifiable barriers or psychosocial needs.;The patient should benefit from training in stress management and relaxation.   Screening Interventions   Interventions Encouraged to exercise      Quality of Life Scores:     Quality of Life - 01/21/16 1230    Quality of Life Scores   Health/Function Post 21.6 %   Socioeconomic Post 23.14 %   Psych/Spiritual Post 23.57 %   Family Post 23.5 %   GLOBAL Post 22.6 %      PHQ-9:     Recent Review Flowsheet Data    Depression screen [facility] 2/9 11/19/2015   Decreased Interest 0   Down, Depressed,  Hopeless 0   PHQ - 2 Score 0   Altered sleeping 0   Tired, decreased energy 3   Change in appetite 3   Feeling bad or failure about yourself  0   Trouble concentrating 0   Moving slowly or fidgety/restless 0   Suicidal thoughts 0   PHQ-9 Score 6   Difficult doing work/chores Somewhat difficult       Psychosocial Evaluation and Intervention:     Psychosocial Evaluation - 11/21/15 0935    Psychosocial Evaluation & Interventions   Interventions Encouraged to exercise with the program and follow exercise prescription   Comments Counselor met with Justin [name] today for initial psychosocial evaluation.  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  He has a strong support system with a spouse of 34 years and a daughter who lives close by.  He also is actively involved in his local church community.  Justin [name] has other medical issues that impact his health, including severe osteoporsis and sleep apnea.  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  Justin [name] has a history of sleep apnea and states he uses a ventilator at night.  He has a good appetite and denies a history or current symptoms of depresion or anxiety.  Justin [name] states he is typically in a positive mood and has minimal stress in his life at this time.  His goals for this program are to breathe better and lose some weight.  He has a treadmill at home and plans to begin using that and join [employer] following completion of this program.        Psychosocial Re-Evaluation:     Psychosocial Re-Evaluation      12/28/15 0910 12/31/15 0942         Psychosocial Re-Evaluation   Interventions Encouraged to attend Cardiac Rehabilitation for the exercise       Comments Justin [name] said his brother has moved in with him afer his brother was in the CCU the same time Lem was for his heart attack. His brother went to phyiscal therapy rehab then came  to live with them with home health due to  his CHF. Gave info to give to his brother about attending CR after home health is done.  Counselor follow up with Justin [name] today reporting feeling stronger, breathing better and sleeping better - other than his back hurting - since he came into this program.  Counselor and Justin [name] discussed his options once he completes this program with Silver Sneakers being an option for consistent exercise.  Justin [name] continues to maintain a positive mood.  Counselor commended him for all of his hard work and progress already made in this program.           Vocational Rehabilitation: Provide vocational rehab assistance to qualifying candidates.   Vocational Rehab Evaluation & Intervention:     Vocational Rehab - 11/19/15 1332    Initial Vocational Rehab Evaluation & Intervention   Assessment shows need for Vocational Rehabilitation No      Education: Education Goals: Education classes will be provided on a weekly basis, covering required topics. Participant will state understanding/return demonstration of topics presented.  Learning Barriers/Preferences:     Learning Barriers/Preferences - 11/19/15 1333    Learning Barriers/Preferences   Learning Barriers Hearing   Learning Preferences None      Education Topics: General Nutrition Guidelines/Fats and Fiber: -Group instruction provided by verbal, written material, models and posters to present the general guidelines for heart healthy nutrition. Gives an explanation and review of dietary fats and fiber.   Controlling Sodium/Reading Food Labels: -Group verbal and written material supporting the discussion of sodium use in heart healthy nutrition. Review and explanation with models, verbal and written materials for utilization of the food label.          Cardiac Rehab from 01/23/2016 in [facility] Cardiac and Pulmonary Rehab   Date  12/31/15   Educator  C. Joneen Caraway, RD   Instruction Review Code  2- meets goals/outcomes      Exercise  Physiology & Risk Factors: - Group verbal and written instruction with models to review the exercise physiology of the cardiovascular system and associated critical values. Details cardiovascular disease risk factors and the goals associated with each risk factor.      Cardiac Rehab from 01/23/2016 in [facility] Cardiac and Pulmonary Rehab   Date  01/09/16   Educator  BS   Instruction Review Code  2- meets goals/outcomes      Aerobic Exercise & Resistance Training: - Gives group verbal and written discussion on the health impact of inactivity. On the components of aerobic and resistive training programs and the benefits of this training and how to safely progress through these programs.      Cardiac Rehab from 01/23/2016 in [facility] Cardiac and Pulmonary Rehab   Date  01/14/16   Educator  RS   Instruction Review Code  2- meets goals/outcomes      Flexibility, Balance, General Exercise Guidelines: - Provides group verbal and written instruction on the benefits of flexibility and balance training programs. Provides general exercise guidelines with specific guidelines to those with heart or lung disease. Demonstration and skill practice provided.      Cardiac Rehab from 01/23/2016 in [facility] Cardiac and Pulmonary Rehab   Date  01/16/16   Educator  bs   Instruction Review Code  2- meets goals/outcomes      Stress Management: - Provides group verbal and written instruction about the health risks of elevated stress, cause of high stress, and healthy ways to reduce stress.  Cardiac Rehab from 01/23/2016 in [facility] Cardiac and Pulmonary Rehab   Date  01/23/16   Educator  [facility]   Instruction Review Code  2- meets goals/outcomes      Depression: - Provides group verbal and written instruction on the correlation between heart/lung disease and depressed mood, treatment options, and the stigmas associated with seeking treatment.      Cardiac Rehab from 01/23/2016 in [facility] Cardiac and Pulmonary Rehab   Date  12/26/15    Educator  Lucianne Lei, MSW   Instruction Review Code  2- meets goals/outcomes      Anatomy & Physiology of the Heart: - Group verbal and written instruction and models provide basic cardiac anatomy and physiology, with the coronary electrical and arterial systems. Review of: AMI, Angina, Valve disease, Heart Failure, Cardiac Arrhythmia, Pacemakers, and the ICD.      Cardiac Rehab from 01/23/2016 in [facility] Cardiac and Pulmonary Rehab   Date  01/21/16   Educator  SB   Instruction Review Code  2- meets goals/outcomes      Cardiac Procedures: - Group verbal and written instruction and models to describe the testing methods done to diagnose heart disease. Reviews the outcomes of the test results. Describes the treatment choices: Medical Management, Angioplasty, or Coronary Bypass Surgery.      Cardiac Rehab from 01/23/2016 in [facility] Cardiac and Pulmonary Rehab   Date  12/10/15   Educator  SB   Instruction Review Code  2- meets goals/outcomes      Cardiac Medications: - Group verbal and written instruction to review commonly prescribed medications for heart disease. Reviews the medication, class of the drug, and side effects. Includes the steps to properly store meds and maintain the prescription regimen.   Go Sex-Intimacy & Heart Disease, Get SMART - Goal Setting: - Group verbal and written instruction through game format to discuss heart disease and the return to sexual intimacy. Provides group verbal and written material to discuss and apply goal setting through the application of the S.M.A.R.T. Method.      Cardiac Rehab from 01/23/2016 in [facility] Cardiac and Pulmonary Rehab   Date  12/10/15   Educator  SB   Instruction Review Code  2- meets goals/outcomes      Other Matters of the Heart: - Provides group verbal, written materials and models to describe Heart Failure, Angina, Valve Disease, and Diabetes in the realm of heart disease. Includes description of the disease process and  treatment options available to the cardiac patient.      Cardiac Rehab from 01/23/2016 in [facility] Cardiac and Pulmonary Rehab   Date  01/21/16   Educator  SB   Instruction Review Code  2- meets goals/outcomes      Exercise & Equipment Safety: - Individual verbal instruction and demonstration of equipment use and safety with use of the equipment.      Cardiac Rehab from 01/23/2016 in [facility] Cardiac and Pulmonary Rehab   Date  11/19/15   Educator  SB   Instruction Review Code  2- meets goals/outcomes      Infection Prevention: - Provides verbal and written material to individual with discussion of infection control including proper hand washing and proper equipment cleaning during exercise session.      Cardiac Rehab from 01/23/2016 in [facility] Cardiac and Pulmonary Rehab   Date  11/19/15   Educator  Sb   Instruction Review Code  2- meets goals/outcomes      Falls Prevention: - Provides verbal and written material  to individual with discussion of falls prevention and safety.      Cardiac Rehab from 01/23/2016 in [facility]   Date  11/19/15   Educator  SB   Instruction Review Code  2- meets goals/outcomes      Diabetes: - Individual verbal and written instruction to review signs/symptoms of diabetes, desired ranges of glucose level fasting, after meals and with exercise. Advice that pre and post exercise glucose checks will be done for 3 sessions at entry of program.    Knowledge Questionnaire Score:     Knowledge Questionnaire Score - 01/21/16 1230    Knowledge Questionnaire Score   Post Score 27      Core Components/Risk Factors/Patient Goals at Admission:     Personal Goals and Risk Factors at Admission - 11/19/15 1313    Core Components/Risk Factors/Patient Goals on Admission    Weight Management Yes   Intervention Weight Management: Develop a combined nutrition and exercise program designed to reach desired caloric intake, while maintaining appropriate  intake of nutrient and fiber, sodium and fats, and appropriate energy expenditure required for the weight goal.;Weight Management: Provide education and appropriate resources to help participant work on and attain dietary goals.;Weight Management/Obesity: Establish reasonable short term and long term weight goals.;Obesity: Provide education and appropriate resources to help participant work on and attain dietary goals.   Admit Weight 202 lb (91.627 kg)   Goal Weight: Short Term 190 lb (86.183 kg)   Goal Weight: Long Term 180 lb (81.647 kg)   Expected Outcomes Short Term: Continue to assess and modify interventions until short term weight is achieved.;Long Term: Adherence to nutrition and physical activity/exercise program aimed toward attainment of established weight goal.   Sedentary Yes   Intervention Provide advice, education, support and counseling about physical activity/exercise needs.;Develop an individualized exercise prescription for aerobic and resistive training based on initial evaluation findings, risk stratification, comorbidities and participant's personal goals.   Expected Outcomes Achievement of increased cardiorespiratory fitness and enhanced flexibility, muscular endurance and strength shown through measurements of functional capaciy and personal statement of participant.   Lipids Yes  Currently taking niacin for low HDL. Reports his levels have increased to over 50 from in the 20's.   Intervention Provide education and support for participant on nutrition & aerobic/resistive exercise along with prescribed medications to achieve LDL <60m, HDL >420m   Expected Outcomes Short Term: Participant states understanding of desired cholesterol values and is compliant with medications prescribed. Participant is following exercise prescription and nutrition guidelines.;Long Term: Cholesterol controlled with medications as prescribed, with individualized exercise RX and with personalized  nutrition plan. Value goals: LDL < 7054mHDL > 40 mg.      Core Components/Risk Factors/Patient Goals Review:      Goals and Risk Factor Review      12/10/15 1548 12/28/15 0905 01/23/16 0953       Core Components/Risk Factors/Patient Goals Review   Personal Goals Review Sedentary;Weight Management/Obesity;Lipids Sedentary;Weight Management/Obesity;Lipids Weight Management/Obesity;Sedentary;Lipids     Review Spoke with Justin [name]. He has been here 5 sessions.  PLans to continue with the program and hopes to see progress with weight loss as he sontinues with the exercise and nutrition plan he is providied. He is compliant with his meds and reports that his HDL is improved to greater than 50 from in the 20;s and that his bad cholseterol is low.  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 He is meeing with the Cardiac Rehab registered dietician soon. He said he lost weight in the past by subsituting a Slim Fast for a meal. He needs to eat something every 2 hours since he gets low blood sugars but he knows when his blood sugar drops. He carries peanut butter crackers. I encoured him to carry a piece of candy or some sugar.  Justin [name] reports more energy to do things around the house and activities outside the home since starting cardiac rehab.  He stated, "I can definitely tell a difference since I started Cardiac Rehab."  Justin [name] stated he has not lost as much wieght as he thougth he would have.  He weighed 203.1 on admission to Cardiac Rehab and today he weighs 200 pounds.  In terms of his cholesterol Justin [name] reports having had his cholesterol /lab work drawn this week.  He will see Dr. Sabra Heck on Monday, May 8th.  Justin [name] also stated his meds usually keep his cholesterol where it needs to be.       Expected Outcomes Continue with the exercise and nutrition plan to maintain risk factor control and working toward weight loss. Elchanan wants to get to 180lbs.   Continue exercising to increase stamina; improve cholesterol;  maintain weight/lose weight; and continue cholesterol medication, diet, and exercise for cholesterol management.          Core Components/Risk Factors/Patient Goals at Discharge (Final Review):      Goals and Risk Factor Review - 01/23/16 0953    Core Components/Risk Factors/Patient Goals Review   Personal Goals Review Weight Management/Obesity;Sedentary;Lipids   Review Justin [name] reports more energy to do things around the house and activities outside the home since starting cardiac rehab.  He stated, "I can definitely tell a difference since I started Cardiac Rehab."  Justin [name] stated he has not lost as much wieght as he thougth he would have.  He weighed 203.1 on admission to Cardiac Rehab and today he weighs 200 pounds.  In terms of his cholesterol Justin [name] reports having had his cholesterol /lab work drawn this week.  He will see Dr. Sabra Heck on Monday, May 8th.  Justin [name] also stated his meds usually keep his cholesterol where it needs to be.     Expected Outcomes Continue exercising to increase stamina; improve cholesterol;  maintain weight/lose weight; and continue cholesterol medication, diet, and exercise for cholesterol management.        ITP Comments:     ITP Comments      11/19/15 1310 11/21/15 0726 01/04/16 1406 01/13/16 0937 01/30/16 0913   ITP Comments Orientation completed.  Initial ITP  Continue with ITP.     30 day review   Orientation is completed to start sessions this week   Continue with ITP 30 day review 30 day review.  Continue with ITP Brinson reported he saw his PMD yesterday and all labs were good.      Comments:

## 2016-01-30 NOTE — Patient Instructions (Addendum)
Discharge Instructions  Patient Details  Name: Justin [name] MRN: [id number] Date of Birth: 07-27-1941 Referring Provider:  Isaias Cowman, MD   Number of Visits: completed program  Reason for Discharge:  Patient reached a stable level of exercise. Patient independent in their exercise.  Smoking History:  History  Smoking status  . Former Smoker  . Quit date: 06/26/1990  Smokeless tobacco  . Never Used    Diagnosis:  NSTEMI (non-ST elevated myocardial infarction) (HCC)  S/P PTCA (percutaneous transluminal coronary angioplasty)  Initial Exercise Prescription:     Initial Exercise Prescription - 11/19/15 1400    Date of Initial Exercise RX and Referring Provider   Date 11/19/15   Treadmill   MPH 2   Grade 0   Minutes 15   Bike   Level 1   Minutes 15   Recumbant Bike   Level 2   Watts 30   NuStep   Level 3   Watts 40   Minutes 15   Arm Ergometer   Level 1   Watts 8   Minutes 15   Arm/Foot Ergometer   Level 1   Watts 10   Minutes 15   Cybex   Level 2   RPM 40   Minutes 15   Recumbant Elliptical   Level 2   Watts 20   Minutes 15   Elliptical   Level 1   Speed 3   Minutes 5   REL-XR   Level 3   Watts 40   Minutes 15   T5 Nustep   Level 2   Watts 20   Minutes 15   Biostep-RELP   Level 3   Watts 40   Minutes 15   Prescription Details   Frequency (times per week) 3   Duration Progress to 30 minutes of continuous aerobic without signs/symptoms of physical distress   Intensity   THRR REST +  30   Ratings of Perceived Exertion 11-15   Perceived Dyspnea 2-4   Progression   Progression Continue progressive overload as per policy without signs/symptoms or physical distress.   Resistance Training   Training Prescription Yes   Weight 2   Reps 10-15      Discharge Exercise Prescription (Final Exercise Prescription Changes):     Exercise Prescription Changes - 12/31/15 0800    Exercise Review   Progression Yes   Response to  Exercise   Blood Pressure (Admit) 120/68 mmHg   Blood Pressure (Exercise) 148/82 mmHg   Blood Pressure (Exit) 116/72 mmHg   Heart Rate (Admit) 85 bpm   Heart Rate (Exercise) 112 bpm   Heart Rate (Exit) 71 bpm   Rating of Perceived Exertion (Exercise) 14   Symptoms None   Comments Reveiwed individual exercise prescription with patient. He tolerated it well with no signs or symptoms.    Duration Progress to 45 minutes of aerobic exercise without signs/symptoms of physical distress   Intensity Rest + 30   Progression   Progression Continue progressive overload as per policy without signs/symptoms or physical distress.   Resistance Training   Training Prescription Yes   Weight 4   Reps 10-15   Interval Training   Interval Training Yes   Equipment REL-XR   Comments Interval Training was recommended to the patient to begin with 2 min x 30 second high intensity intervals on the XR. 30 second interval should be done at a 15 RPE.    Treadmill   MPH 3.4   Grade 2  Minutes 25   Bike   Level --   Minutes --   Recumbant Bike   Level --   Watts --   NuStep   Level --   Watts --   Minutes --   Arm Ergometer   Level --   Watts --   Minutes --   Arm/Foot Ergometer   Level --   Watts --   Minutes --   Cybex   Level --   RPM --   Minutes --   Recumbant Elliptical   Level --   Watts --   Minutes --   Elliptical   Level --   Speed --   Minutes --   REL-XR   Level 10   Watts 124   Minutes 20   T5 Nustep   Level --   Watts --   Minutes --   Biostep-RELP   Level --   Watts --   Minutes --      Functional Capacity:     6 Minute Walk      11/19/15 1420 01/30/16 1251 01/30/16 1349   6 Minute Walk   Phase Initial (p) Discharge    Distance 1300 feet  1710 feet   Distance % Change   32 %   Walk Time 6 minutes  6 minutes   # of Rest Breaks 0  0   RPE 13  14   Symptoms Yes (comment)  No   Comments short of breath and leg weakness     Resting HR 60 bpm  59 bpm    Resting BP 112/72 mmHg  104/62 mmHg   Max Ex. HR 84 bpm  95 bpm   Max Ex. BP 132/78 mmHg  154/70 mmHg      Quality of Life:     Quality of Life - 01/21/16 1230    Quality of Life Scores   Health/Function Post 21.6 %   Socioeconomic Post 23.14 %   Psych/Spiritual Post 23.57 %   Family Post 23.5 %   GLOBAL Post 22.6 %      Personal Goals: Goals established at orientation with interventions provided to work toward goal.     Personal Goals and Risk Factors at Admission - 11/19/15 1313    Core Components/Risk Factors/Patient Goals on Admission    Weight Management Yes   Intervention Weight Management: Develop a combined nutrition and exercise program designed to reach desired caloric intake, while maintaining appropriate intake of nutrient and fiber, sodium and fats, and appropriate energy expenditure required for the weight goal.;Weight Management: Provide education and appropriate resources to help participant work on and attain dietary goals.;Weight Management/Obesity: Establish reasonable short term and long term weight goals.;Obesity: Provide education and appropriate resources to help participant work on and attain dietary goals.   Admit Weight 202 lb (91.627 kg)   Goal Weight: Short Term 190 lb (86.183 kg)   Goal Weight: Long Term 180 lb (81.647 kg)   Expected Outcomes Short Term: Continue to assess and modify interventions until short term weight is achieved.;Long Term: Adherence to nutrition and physical activity/exercise program aimed toward attainment of established weight goal.   Sedentary Yes   Intervention Provide advice, education, support and counseling about physical activity/exercise needs.;Develop an individualized exercise prescription for aerobic and resistive training based on initial evaluation findings, risk stratification, comorbidities and participant's personal goals.   Expected Outcomes Achievement of increased cardiorespiratory fitness and enhanced  flexibility, muscular endurance and strength shown through measurements of functional capaciy  and personal statement of participant.   Lipids Yes  Currently taking niacin for low HDL. Reports his levels have increased to over 50 from in the 20's.   Intervention Provide education and support for participant on nutrition & aerobic/resistive exercise along with prescribed medications to achieve LDL 70mg , HDL >40mg .   Expected Outcomes Short Term: Participant states understanding of desired cholesterol values and is compliant with medications prescribed. Participant is following exercise prescription and nutrition guidelines.;Long Term: Cholesterol controlled with medications as prescribed, with individualized exercise RX and with personalized nutrition plan. Value goals: LDL < 70mg , HDL > 40 mg.       Personal Goals Discharge:     Goals and Risk Factor Review - 01/23/16 0953    Core Components/Risk Factors/Patient Goals Review   Personal Goals Review Weight Management/Obesity;Sedentary;Lipids   Review Quyen reports more energy to do things around the house and activities outside the home since starting cardiac rehab.  He stated, "I can definitely tell a difference since I started Cardiac Rehab."  Deidre Ala stated he has not lost as much wieght as he thougth he would have.  He weighed 203.1 on admission to Cardiac Rehab and today he weighs 200 pounds.  In terms of his cholesterol Deidre Ala reports having had his cholesterol /lab work drawn this week.  He will see Dr. Sabra Heck on Monday, May 8th.  Deidre Ala also stated his meds usually keep his cholesterol where it needs to be.     Expected Outcomes Continue exercising to increase stamina; improve cholesterol;  maintain weight/lose weight; and continue cholesterol medication, diet, and exercise for cholesterol management.        Nutrition & Weight - Outcomes:     Pre Biometrics - 11/19/15 1425    Pre Biometrics   Height 5\' 9"  (1.753 m)   Weight 202 lb 9.6 oz  (91.899 kg)   Waist Circumference 44 inches   Hip Circumference 43.5 inches   Waist to Hip Ratio 1.01 %   BMI (Calculated) 30       Nutrition:     Nutrition Therapy & Goals - 01/08/16 1652    Nutrition Therapy   Diet Heart Healthy dietary guidelines based on 1700 calories   Drug/Food Interactions Statins/Certain Fruits   Protein (specify units) 7   Fiber 30 grams   Whole Grain Foods 3 servings   Saturated Fats 12 max. grams   Fruits and Vegetables 5 servings/day   Sodium 1500 grams   Personal Nutrition Goals   Personal Goal #1 Include more whole grains through 100% whole wheat bread, oatmeal, 1/2 cup brown rice, corn, popcorn   Personal Goal #2 Include at least 7 oz.of a protein food. Refer to list   Personal Goal #3 Include a protein food at breakfast to help avoid low blood sugar.   Personal Goal #4 Add 1-2 servings of fruit per day.   Intervention Plan   Intervention Prescribe, educate and counsel regarding individualized specific dietary modifications aiming towards targeted core components such as weight, hypertension, lipid management, diabetes, heart failure and other comorbidities.;Nutrition handout(s) given to patient.   Expected Outcomes Short Term Goal: A plan has been developed with personal nutrition goals set during dietitian appointment.;Short Term Goal: Understand basic principles of dietary content, such as calories, fat, sodium, cholesterol and nutrients.;Long Term Goal: Adherence to prescribed nutrition plan.      Nutrition Discharge:     Nutrition Assessments - 01/21/16 1230    Rate Your Plate Scores   Post Score 68  Post Score % 75.5 %      Education Questionnaire Score:     Knowledge Questionnaire Score - 01/21/16 1230    Knowledge Questionnaire Score   Post Score 27      Goals reviewed with patient; copy given to patient. Discharge Instructions  Patient Details  Name: Justin [name] MRN: [id number] Date of Birth: 05-28-1941 Referring  Provider:  Isaias Cowman, MD   Number of Visits:    Reason for Discharge:  Patient reached a stable level of exercise. Patient independent in their exercise.  Smoking History:  History  Smoking status  . Former Smoker  . Quit date: 06/26/1990  Smokeless tobacco  . Never Used    Diagnosis:  NSTEMI (non-ST elevated myocardial infarction) (HCC)  S/P PTCA (percutaneous transluminal coronary angioplasty)  Initial Exercise Prescription:     Initial Exercise Prescription - 11/19/15 1400    Date of Initial Exercise RX and Referring Provider   Date 11/19/15   Treadmill   MPH 2   Grade 0   Minutes 15   Bike   Level 1   Minutes 15   Recumbant Bike   Level 2   Watts 30   NuStep   Level 3   Watts 40   Minutes 15   Arm Ergometer   Level 1   Watts 8   Minutes 15   Arm/Foot Ergometer   Level 1   Watts 10   Minutes 15   Cybex   Level 2   RPM 40   Minutes 15   Recumbant Elliptical   Level 2   Watts 20   Minutes 15   Elliptical   Level 1   Speed 3   Minutes 5   REL-XR   Level 3   Watts 40   Minutes 15   T5 Nustep   Level 2   Watts 20   Minutes 15   Biostep-RELP   Level 3   Watts 40   Minutes 15   Prescription Details   Frequency (times per week) 3   Duration Progress to 30 minutes of continuous aerobic without signs/symptoms of physical distress   Intensity   THRR REST +  30   Ratings of Perceived Exertion 11-15   Perceived Dyspnea 2-4   Progression   Progression Continue progressive overload as per policy without signs/symptoms or physical distress.   Resistance Training   Training Prescription Yes   Weight 2   Reps 10-15      Discharge Exercise Prescription (Final Exercise Prescription Changes):     Exercise Prescription Changes - 12/31/15 0800    Exercise Review   Progression Yes   Response to Exercise   Blood Pressure (Admit) 120/68 mmHg   Blood Pressure (Exercise) 148/82 mmHg   Blood Pressure (Exit) 116/72 mmHg   Heart  Rate (Admit) 85 bpm   Heart Rate (Exercise) 112 bpm   Heart Rate (Exit) 71 bpm   Rating of Perceived Exertion (Exercise) 14   Symptoms None   Comments Reveiwed individual exercise prescription with patient. He tolerated it well with no signs or symptoms.    Duration Progress to 45 minutes of aerobic exercise without signs/symptoms of physical distress   Intensity Rest + 30   Progression   Progression Continue progressive overload as per policy without signs/symptoms or physical distress.   Resistance Training   Training Prescription Yes   Weight 4   Reps 10-15   Interval Training   Interval Training Yes  Equipment REL-XR   Comments Interval Training was recommended to the patient to begin with 2 min x 30 second high intensity intervals on the XR. 30 second interval should be done at a 15 RPE.    Treadmill   MPH 3.4   Grade 2   Minutes 25   Bike   Level --   Minutes --   Recumbant Bike   Level --   Watts --   NuStep   Level --   Watts --   Minutes --   Arm Ergometer   Level --   Watts --   Minutes --   Arm/Foot Ergometer   Level --   Watts --   Minutes --   Cybex   Level --   RPM --   Minutes --   Recumbant Elliptical   Level --   Watts --   Minutes --   Elliptical   Level --   Speed --   Minutes --   REL-XR   Level 10   Watts 124   Minutes 20   T5 Nustep   Level --   Watts --   Minutes --   Biostep-RELP   Level --   Watts --   Minutes --      Functional Capacity:     6 Minute Walk      11/19/15 1420 01/30/16 1251 01/30/16 1349   6 Minute Walk   Phase Initial (p) Discharge    Distance 1300 feet  1710 feet   Distance % Change   32 %   Walk Time 6 minutes  6 minutes   # of Rest Breaks 0  0   RPE 13  14   Symptoms Yes (comment)  No   Comments short of breath and leg weakness     Resting HR 60 bpm  59 bpm   Resting BP 112/72 mmHg  104/62 mmHg   Max Ex. HR 84 bpm  95 bpm   Max Ex. BP 132/78 mmHg  154/70 mmHg      Quality of Life:      Quality of Life - 01/21/16 1230    Quality of Life Scores   Health/Function Post 21.6 %   Socioeconomic Post 23.14 %   Psych/Spiritual Post 23.57 %   Family Post 23.5 %   GLOBAL Post 22.6 %      Personal Goals: Goals established at orientation with interventions provided to work toward goal.     Personal Goals and Risk Factors at Admission - 11/19/15 1313    Core Components/Risk Factors/Patient Goals on Admission    Weight Management Yes   Intervention Weight Management: Develop a combined nutrition and exercise program designed to reach desired caloric intake, while maintaining appropriate intake of nutrient and fiber, sodium and fats, and appropriate energy expenditure required for the weight goal.;Weight Management: Provide education and appropriate resources to help participant work on and attain dietary goals.;Weight Management/Obesity: Establish reasonable short term and long term weight goals.;Obesity: Provide education and appropriate resources to help participant work on and attain dietary goals.   Admit Weight 202 lb (91.627 kg)   Goal Weight: Short Term 190 lb (86.183 kg)   Goal Weight: Long Term 180 lb (81.647 kg)   Expected Outcomes Short Term: Continue to assess and modify interventions until short term weight is achieved.;Long Term: Adherence to nutrition and physical activity/exercise program aimed toward attainment of established weight goal.   Sedentary Yes   Intervention Provide advice, education, support  and counseling about physical activity/exercise needs.;Develop an individualized exercise prescription for aerobic and resistive training based on initial evaluation findings, risk stratification, comorbidities and participant's personal goals.   Expected Outcomes Achievement of increased cardiorespiratory fitness and enhanced flexibility, muscular endurance and strength shown through measurements of functional capaciy and personal statement of participant.   Lipids Yes   Currently taking niacin for low HDL. Reports his levels have increased to over 50 from in the 20's.   Intervention Provide education and support for participant on nutrition & aerobic/resistive exercise along with prescribed medications to achieve LDL 70mg , HDL >40mg .   Expected Outcomes Short Term: Participant states understanding of desired cholesterol values and is compliant with medications prescribed. Participant is following exercise prescription and nutrition guidelines.;Long Term: Cholesterol controlled with medications as prescribed, with individualized exercise RX and with personalized nutrition plan. Value goals: LDL < 70mg , HDL > 40 mg.       Personal Goals Discharge:     Goals and Risk Factor Review - 01/23/16 0953    Core Components/Risk Factors/Patient Goals Review   Personal Goals Review Weight Management/Obesity;Sedentary;Lipids   Review Justin [name] reports more energy to do things around the house and activities outside the home since starting cardiac rehab.  He stated, "I can definitely tell a difference since I started Cardiac Rehab."  Deidre Ala stated he has not lost as much wieght as he thougth he would have.  He weighed 203.1 on admission to Cardiac Rehab and today he weighs 200 pounds.  In terms of his cholesterol Deidre Ala reports having had his cholesterol /lab work drawn this week.  He will see Dr. Sabra Heck on Monday, May 8th.  Deidre Ala also stated his meds usually keep his cholesterol where it needs to be.     Expected Outcomes Continue exercising to increase stamina; improve cholesterol;  maintain weight/lose weight; and continue cholesterol medication, diet, and exercise for cholesterol management.        Nutrition & Weight - Outcomes:     Pre Biometrics - 11/19/15 1425    Pre Biometrics   Height 5\' 9"  (1.753 m)   Weight 202 lb 9.6 oz (91.899 kg)   Waist Circumference 44 inches   Hip Circumference 43.5 inches   Waist to Hip Ratio 1.01 %   BMI (Calculated) 30        Nutrition:     Nutrition Therapy & Goals - 01/08/16 1652    Nutrition Therapy   Diet Heart Healthy dietary guidelines based on 1700 calories   Drug/Food Interactions Statins/Certain Fruits   Protein (specify units) 7   Fiber 30 grams   Whole Grain Foods 3 servings   Saturated Fats 12 max. grams   Fruits and Vegetables 5 servings/day   Sodium 1500 grams   Personal Nutrition Goals   Personal Goal #1 Include more whole grains through 100% whole wheat bread, oatmeal, 1/2 cup brown rice, corn, popcorn   Personal Goal #2 Include at least 7 oz.of a protein food. Refer to list   Personal Goal #3 Include a protein food at breakfast to help avoid low blood sugar.   Personal Goal #4 Add 1-2 servings of fruit per day.   Intervention Plan   Intervention Prescribe, educate and counsel regarding individualized specific dietary modifications aiming towards targeted core components such as weight, hypertension, lipid management, diabetes, heart failure and other comorbidities.;Nutrition handout(s) given to patient.   Expected Outcomes Short Term Goal: A plan has been developed with personal nutrition goals set during dietitian appointment.;Short Term  Goal: Understand basic principles of dietary content, such as calories, fat, sodium, cholesterol and nutrients.;Long Term Goal: Adherence to prescribed nutrition plan.      Nutrition Discharge:     Nutrition Assessments - 01/21/16 1230    Rate Your Plate Scores   Post Score 68   Post Score % 75.5 %      Education Questionnaire Score:     Knowledge Questionnaire Score - 01/21/16 1230    Knowledge Questionnaire Score   Post Score 27      Goals reviewed with patient; copy given to patient.

## 2016-01-30 NOTE — Progress Notes (Signed)
Daily Session Note  Patient Details  Name: Justin [name] MRN: [id number] Date of Birth: May 20, 1941 Referring Provider:    Encounter Date: 01/30/2016  Check In:     Session Check In - 01/30/16 0912    Check-In   Staff Present Heath Lark, RN, BSN, CCRP;Laureen Owens Shark, BS, RRT, Respiratory Therapist;Carroll Enterkin, RN, BSN   Supervising physician immediately available to respond to emergencies See telemetry face sheet for immediately available ER MD   Medication changes reported     No   Fall or balance concerns reported    No   Warm-up and Cool-down Performed on first and last piece of equipment   VAD Patient? No   Pain Assessment   Currently in Pain? No/denies         Goals Met:  Independence with exercise equipment Exercise tolerated well Personal goals reviewed No report of cardiac concerns or symptoms Strength training completed today  Goals Unmet:  Not Applicable  Comments: Doing well with exercise prescription progression.    Dr. [name] is Medical Director for [place] [facility].

## 2016-02-01 ENCOUNTER — Encounter: Payer: Medicare Other | Admitting: *Deleted

## 2016-02-01 DIAGNOSIS — I214 Non-ST elevation (NSTEMI) myocardial infarction: Secondary | ICD-10-CM

## 2016-02-01 DIAGNOSIS — Z9861 Coronary angioplasty status: Secondary | ICD-10-CM

## 2016-02-01 NOTE — Progress Notes (Signed)
Daily Session Note  Patient Details  Name: HOY FALLERT MRN: [id number] Date of Birth: 1941/06/05 Referring Provider:    Encounter Date: 02/01/2016  Check In:     Session Check In - 02/01/16 0955    Check-In   Location [facility]   Supervising physician immediately available to respond to emergencies See telemetry face sheet for immediately available ER MD   Medication changes reported     No   Fall or balance concerns reported    No   Warm-up and Cool-down Performed on first and last piece of equipment   Resistance Training Performed Yes   VAD Patient? No   Pain Assessment   Currently in Pain? No/denies   Multiple Pain Sites No         Goals Met:  Independence with exercise equipment Exercise tolerated well No report of cardiac concerns or symptoms Strength training completed today  Goals Unmet:  Not Applicable  Comments: Patient completed exercise prescription and all exercise goals during rehab session. The exercise was tolerated well and the patient is progressing in the program.     Dr. [name] is Medical Director for [place] [facility].

## 2016-02-01 NOTE — Progress Notes (Signed)
Daily Session Note  Patient Details  Name: Justin [name] MRN: [id number] Date of Birth: 1941/06/05 Referring Provider:    Encounter Date: 02/01/2016  Check In:     Session Check In - 02/01/16 0955    Check-In   Location [facility]   Supervising physician immediately available to respond to emergencies See telemetry face sheet for immediately available ER MD   Medication changes reported     No   Fall or balance concerns reported    No   Warm-up and Cool-down Performed on first and last piece of equipment   Resistance Training Performed Yes   VAD Patient? No   Pain Assessment   Currently in Pain? No/denies   Multiple Pain Sites No         Goals Met:  Independence with exercise equipment Exercise tolerated well Personal goals reviewed No report of cardiac concerns or symptoms Strength training completed today  Goals Unmet:  Not Applicable  Comments: discharged   Dr. [name] is Medical Director for [place] [facility].

## 2016-02-01 NOTE — Progress Notes (Signed)
Cardiac Individual Treatment Plan  Patient Details  Name: Justin [name] MRN: [id number] Date of Birth: Oct 01, 1940 Referring Provider:    Initial Encounter Date:       Cardiac Rehab from 11/19/2015 in [facility] Cardiac and Pulmonary Rehab   Date  11/19/15      Visit Diagnosis: NSTEMI (non-ST elevated myocardial infarction) ([place])  S/P PTCA (percutaneous transluminal coronary angioplasty)  Patient's Home Medications on Admission:  Current outpatient prescriptions:  .  acetaminophen (TYLENOL) 325 MG tablet, Take 650 mg by mouth 3 (three) times daily. , Disp: , Rfl:  .  aspirin EC 325 MG EC tablet, Take 1 tablet (325 mg total) by mouth daily., Disp: 30 tablet, Rfl: 0 .  atorvastatin (LIPITOR) 80 MG tablet, Take 40 mg by mouth at bedtime. , Disp: , Rfl:  .  Calcium Carb-Cholecalciferol (CALCIUM 600 + D PO), Take 1 tablet by mouth 2 (two) times daily. Take morning and at noon., Disp: , Rfl:  .  cholecalciferol (VITAMIN D) 1000 units tablet, Take 1,000 Units by mouth 3 (three) times daily., Disp: , Rfl:  .  clopidogrel (PLAVIX) 75 MG tablet, Take 1 tablet (75 mg total) by mouth daily., Disp: 30 tablet, Rfl: 0 .  fexofenadine (ALLEGRA) 180 MG tablet, Take 180 mg by mouth daily before breakfast. , Disp: , Rfl:  .  fluticasone (FLONASE) 50 MCG/ACT nasal spray, Place 2 sprays into both nostrils at bedtime., Disp: , Rfl:  .  isosorbide mononitrate (IMDUR) 60 MG 24 hr tablet, Take 60 mg by mouth daily., Disp: , Rfl:  .  LACTOBACILLUS PO, Take 2 tablets by mouth every morning. , Disp: , Rfl:  .  levofloxacin (LEVAQUIN) 750 MG tablet, Take 1 tablet (750 mg total) by mouth daily. (Patient not taking: Reported on 11/19/2015), Disp: 4 tablet, Rfl: 0 .  Magnesium 400 MG CAPS, Take 400 mg by mouth 2 (two) times daily. , Disp: , Rfl:  .  metoprolol (LOPRESSOR) 50 MG tablet, Take 50 mg by mouth 2 (two) times daily., Disp: , Rfl:  .  niacin 500 MG tablet, Take 500 mg by mouth 3 (three) times daily. Take one tablet  in the morning, take one tablet in the afternoon and take one tablet at night, Disp: , Rfl:  .  nitroGLYCERIN (NITROSTAT) 0.4 MG SL tablet, Place 0.4 mg under the tongue every 5 (five) minutes x 3 doses as needed for chest pain. If no relief call MD or go to emergency room., Disp: , Rfl:  .  Omega-3 Fatty Acids (FISH OIL) 1000 MG CAPS, Take 1,000 mg by mouth 2 (two) times daily., Disp: , Rfl:  .  omeprazole (PRILOSEC) 20 MG capsule, Take 20 mg by mouth 2 (two) times daily before a meal., Disp: , Rfl:  .  senna-docusate (SENOKOT-S) 8.6-50 MG per tablet, Take 2 tablets by mouth 2 (two) times daily. 4 TIMES DAILY, Disp: , Rfl:  .  traMADol (ULTRAM) 50 MG tablet, Take 50 mg by mouth 3 (three) times daily. , Disp: , Rfl:  .  vitamin C (ASCORBIC ACID) 500 MG tablet, Take 500 mg by mouth daily., Disp: , Rfl:  .  Zoledronic Acid (RECLAST IV), Inject into the vein. Injection once yearly, Disp: , Rfl:   Past Medical History: Past Medical History  Diagnosis Date  . Skin cancer   . History of open heart surgery   . CAD (coronary artery disease)   . Arthritis   . DJD (degenerative joint disease)  Tobacco Use: History  Smoking status  . Former Smoker  . Quit date: 06/26/1990  Smokeless tobacco  . Never Used    Labs: Recent Review Flowsheet Data    Labs for ITP Cardiac and Pulmonary Rehab Latest Ref Rng 11/04/2015   Cholestrol 0 - 200 mg/dL 98   LDLCALC 0 - 99 mg/dL 29   HDL >40 mg/dL 56   Trlycerides <150 mg/dL 64       Exercise Target Goals:    Exercise Program Goal: Individual exercise prescription set with THRR, safety & activity barriers. Participant demonstrates ability to understand and report RPE using BORG scale, to self-measure pulse accurately, and to acknowledge the importance of the exercise prescription.  Exercise Prescription Goal: Starting with aerobic activity 30 plus minutes a day, 3 days per week for initial exercise prescription. Provide home exercise prescription  and guidelines that participant acknowledges understanding prior to discharge.  Activity Barriers & Risk Stratification:     Activity Barriers & Cardiac Risk Stratification - 11/19/15 1328    Activity Barriers & Cardiac Risk Stratification   Activity Barriers Back Problems;Deconditioning  Bulging disc L2 and L3. Usually will not bother with exercise. Most bother when sitting for periods of time.    Cardiac Risk Stratification High      6 Minute Walk:     6 Minute Walk      11/19/15 1420 01/30/16 1251 01/30/16 1349   6 Minute Walk   Phase Initial (p) Discharge    Distance 1300 feet  1710 feet   Distance % Change   32 %   Walk Time 6 minutes  6 minutes   # of Rest Breaks 0  0   RPE 13  14   Symptoms Yes (comment)  No   Comments short of breath and leg weakness     Resting HR 60 bpm  59 bpm   Resting BP 112/72 mmHg  104/62 mmHg   Max Ex. HR 84 bpm  95 bpm   Max Ex. BP 132/78 mmHg  154/70 mmHg      Initial Exercise Prescription:     Initial Exercise Prescription - 11/19/15 1400    Date of Initial Exercise RX and Referring Provider   Date 11/19/15   Treadmill   MPH 2   Grade 0   Minutes 15   Bike   Level 1   Minutes 15   Recumbant Bike   Level 2   Watts 30   NuStep   Level 3   Watts 40   Minutes 15   Arm Ergometer   Level 1   Watts 8   Minutes 15   Arm/Foot Ergometer   Level 1   Watts 10   Minutes 15   Cybex   Level 2   RPM 40   Minutes 15   Recumbant Elliptical   Level 2   Watts 20   Minutes 15   Elliptical   Level 1   Speed 3   Minutes 5   REL-XR   Level 3   Watts 40   Minutes 15   T5 Nustep   Level 2   Watts 20   Minutes 15   Biostep-RELP   Level 3   Watts 40   Minutes 15   Prescription Details   Frequency (times per week) 3   Duration Progress to 30 minutes of continuous aerobic without signs/symptoms of physical distress   Intensity   THRR REST +  30  Ratings of Perceived Exertion 11-15   Perceived Dyspnea 2-4   Progression    Progression Continue progressive overload as per policy without signs/symptoms or physical distress.   Resistance Training   Training Prescription Yes   Weight 2   Reps 10-15      Perform Capillary Blood Glucose checks as needed.  Exercise Prescription Changes:     Exercise Prescription Changes      11/19/15 1400 12/03/15 1000 12/10/15 0800 12/14/15 0900 12/31/15 0800   Exercise Review   Progression  Yes Yes Yes Yes   Response to Exercise   Blood Pressure (Admit) 112/72 mmHg 130/70 mmHg   120/68 mmHg   Blood Pressure (Exercise) 132/78 mmHg 148/70 mmHg   148/82 mmHg   Blood Pressure (Exit) 122/70 mmHg 110/62 mmHg   116/72 mmHg   Heart Rate (Admit) 60 bpm 65 bpm   85 bpm   Heart Rate (Exercise) 84 bpm 88 bpm   112 bpm   Heart Rate (Exit) 64 bpm 73 bpm   71 bpm   Rating of Perceived Exertion (Exercise) _0 Symptoms  None None None None   Comments  Reviewed individualized exercise prescription and made increases per departmental policy. Exercise increases were discussed with the patient and they were able to perform the new work loads without issue (no signs or symptoms).  Reviewed individualized exercise prescription and made increases per departmental policy. Exercise increases were discussed with the patient and they were able to perform the new work loads without issue (no signs or symptoms). Patient's weight for the strength training portion was increased. All cardiac intensities remained the same today.  Reviewed individualized exercise prescription and made increases per departmental policy. Exercise increases were discussed with the patient and they were able to perform the new work loads without issue (no signs or symptoms). Patient's weight for the strength training portion was increased. All cardiac intensities remained the same today.  Reveiwed individual exercise prescription with patient. He tolerated it well with no signs or symptoms.    Duration  Progress to 45  minutes of aerobic exercise without signs/symptoms of physical distress Progress to 45 minutes of aerobic exercise without signs/symptoms of physical distress Progress to 45 minutes of aerobic exercise without signs/symptoms of physical distress Progress to 45 minutes of aerobic exercise without signs/symptoms of physical distress   Intensity  Rest + 30 Rest + 30 Rest + 30 Rest + 30   Progression   Progression  Continue progressive overload as per policy without signs/symptoms or physical distress. Continue progressive overload as per policy without signs/symptoms or physical distress. Continue progressive overload as per policy without signs/symptoms or physical distress. Continue progressive overload as per policy without signs/symptoms or physical distress.   Resistance Training   Training Prescription  Yes Yes Yes Yes   Weight  _1 Reps  10-15 10-15 10-15 10-15   Interval Training   Interval Training     Yes   Equipment     REL-XR   Comments     Interval Training was recommended to the patient to begin with 2 min x 30 second high intensity intervals on the XR. 30 second interval should be done at a 15 RPE.    Treadmill   MPH  _2 3.4   Grade  0 0 0 2   Minutes  _3 Bike   Level  _4 --  Minutes  _0 --   Recumbant Bike   Level  _1 --   Watts  _2 --   NuStep   Level  _3 --   Watts  40 40 40 --   Minutes  _4 --   Arm Ergometer   Level  _5 --   Watts  _6 --   Minutes  _7 --   Arm/Foot Ergometer   Level  _8 --   Watts  _9 --   Minutes  _10 --   Cybex   Level  _11 --   RPM  40 40 40 --   Minutes  _12 --   Recumbant Elliptical   Level  _13 --   Watts  _14 --   Minutes  _15 --   Elliptical   Level  _16 --   Speed  _17 --   Minutes  _18 --   REL-XR   Level  _19 Watts  40 40 40 124   Minutes  _20 T5 Nustep   Level  _21 --   Watts  _22 --   Minutes  _23 --   Biostep-RELP   Level  _24 --   Watts  40 40 40 --   Minutes  _25 --      Exercise Comments:     Exercise Comments      12/14/15 0916 12/31/15 1154         Exercise Comments [facility] that have helped him recently since he felt the Nustep hurt his back a little back the other day. He liked the recumbent elliptical today.  Travian stated that he has noticed an increase in energy in doing daily activities. He has been able to do some yard work this week without symptoms. He enjoys woodworking and building and has been able to get back to doing that sometimes.          Discharge Exercise Prescription (Final Exercise Prescription Changes):     Exercise Prescription Changes - 12/31/15 0800    Exercise Review   Progression Yes   Response to Exercise   Blood Pressure (Admit) 120/68 mmHg   Blood Pressure (Exercise) 148/82 mmHg   Blood Pressure (Exit) 116/72 mmHg   Heart Rate (Admit) 85 bpm   Heart Rate (Exercise) 112 bpm   Heart Rate (Exit) 71 bpm   Rating of Perceived Exertion (Exercise) 14   Symptoms None   Comments Reveiwed individual exercise prescription with patient. He tolerated it well with no signs or symptoms.    Duration Progress to 45 minutes of aerobic exercise without signs/symptoms of physical distress   Intensity Rest + 30   Progression   Progression Continue progressive overload as per policy without signs/symptoms or physical distress.   Resistance Training   Training Prescription Yes   Weight 4   Reps 10-15   Interval Training   Interval Training Yes   Equipment REL-XR   Comments Interval Training was recommended to the patient to begin with 2 min x 30 second high intensity intervals on the XR. 30 second interval should be done at a 15 RPE.  Treadmill   MPH 3.4   Grade 2   Minutes 25   Bike   Level --   Minutes --   Recumbant Bike   Level --   Watts --   NuStep   Level --   Watts --    Minutes --   Arm Ergometer   Level --   Watts --   Minutes --   Arm/Foot Ergometer   Level --   Watts --   Minutes --   Cybex   Level --   RPM --   Minutes --   Recumbant Elliptical   Level --   Watts --   Minutes --   Elliptical   Level --   Speed --   Minutes --   REL-XR   Level 10   Watts 124   Minutes 20   T5 Nustep   Level --   Watts --   Minutes --   Biostep-RELP   Level --   Watts --   Minutes --      Nutrition:  Target Goals: Understanding of nutrition guidelines, daily intake of sodium <1532m, cholesterol <2016m calories 30% from fat and 7% or less from saturated fats, daily to have 5 or more servings of fruits and vegetables.  Biometrics:     Pre Biometrics - 11/19/15 1425    Pre Biometrics   Height _0  (1.753 m)   Weight 202 lb 9.6 oz (91.899 kg)   Waist Circumference 44 inches   Hip Circumference 43.5 inches   Waist to Hip Ratio 1.01 %   BMI (Calculated) 30       Nutrition Therapy Plan and Nutrition Goals:     Nutrition Therapy & Goals - 01/08/16 1652    Nutrition Therapy   Diet Heart Healthy dietary guidelines based on 1700 calories   Drug/Food Interactions Statins/Certain Fruits   Protein (specify units) 7   Fiber 30 grams   Whole Grain Foods 3 servings   Saturated Fats 12 max. grams   Fruits and Vegetables 5 servings/day   Sodium 1500 grams   Personal Nutrition Goals   Personal Goal #1 Include more whole grains through 100% whole wheat bread, oatmeal, 1/2 cup brown rice, corn, popcorn   Personal Goal #2 Include at least 7 oz.of a protein food. Refer to list   Personal Goal #3 Include a protein food at breakfast to help avoid low blood sugar.   Personal Goal #4 Add 1-2 servings of fruit per day.   Intervention Plan   Intervention Prescribe, educate and counsel regarding individualized specific dietary modifications aiming towards targeted core components such as weight, hypertension, lipid management, diabetes, heart failure  and other comorbidities.;Nutrition handout(s) given to patient.   Expected Outcomes Short Term Goal: A plan has been developed with personal nutrition goals set during dietitian appointment.;Short Term Goal: Understand basic principles of dietary content, such as calories, fat, sodium, cholesterol and nutrients.;Long Term Goal: Adherence to prescribed nutrition plan.      Nutrition Discharge: Rate Your Plate Scores:     Nutrition Assessments - 01/21/16 1230    Rate Your Plate Scores   Post Score 68   Post Score % 75.5 %      Nutrition Goals Re-Evaluation:     Nutrition Goals Re-Evaluation      12/28/15 0913           Personal Goal #1 Re-Evaluation   Personal Goal #1 Trying to lose weight. Has low blood sugars so needs to  eat small meals or something every 2 hours.        Goal Progress Seen Yes          Psychosocial: Target Goals: Acknowledge presence or absence of depression, maximize coping skills, provide positive support system. Participant is able to verbalize types and ability to use techniques and skills needed for reducing stress and depression.  Initial Review & Psychosocial Screening:     Initial Psych Review & Screening - 11/19/15 1320    Family Dynamics   Good Support System? Yes  "My wife is fantastic". Has a daughter that is available .   Barriers   Psychosocial barriers to participate in program There are no identifiable barriers or psychosocial needs.;The patient should benefit from training in stress management and relaxation.   Screening Interventions   Interventions Encouraged to exercise      Quality of Life Scores:     Quality of Life - 01/21/16 1230    Quality of Life Scores   Health/Function Post 21.6 %   Socioeconomic Post 23.14 %   Psych/Spiritual Post 23.57 %   Family Post 23.5 %   GLOBAL Post 22.6 %      PHQ-9:     Recent Review Flowsheet Data    Depression screen [facility] 2/9 11/19/2015   Decreased Interest 0   Down, Depressed,  Hopeless 0   PHQ - 2 Score 0   Altered sleeping 0   Tired, decreased energy 3   Change in appetite 3   Feeling bad or failure about yourself  0   Trouble concentrating 0   Moving slowly or fidgety/restless 0   Suicidal thoughts 0   PHQ-9 Score 6   Difficult doing work/chores Somewhat difficult       Psychosocial Evaluation and Intervention:     Psychosocial Evaluation - 11/21/15 0935    Psychosocial Evaluation & Interventions   Interventions Encouraged to exercise with the program and follow exercise prescription   Comments Counselor met with Mr. Pak today for initial psychosocial evaluation.  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  He has a strong support system with a spouse of 34 years and a daughter who lives close by.  He also is actively involved in his local church community.  Mr. Prestia has other medical issues that impact his health, including severe osteoporsis and sleep apnea.  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  Mr. Bring has a history of sleep apnea and states he uses a ventilator at night.  He has a good appetite and denies a history or current symptoms of depresion or anxiety.  Mr. Baumgardner states he is typically in a positive mood and has minimal stress in his life at this time.  His goals for this program are to breathe better and lose some weight.  He has a treadmill at home and plans to begin using that and join [employer] following completion of this program.        Psychosocial Re-Evaluation:     Psychosocial Re-Evaluation      12/28/15 0910 12/31/15 0942         Psychosocial Re-Evaluation   Interventions Encouraged to attend Cardiac Rehabilitation for the exercise       Comments Mr. Shylo said his brother has moved in with him afer his brother was in the CCU the same time Lem was for his heart attack. His brother went to phyiscal therapy rehab then came  to live with them with home health due to  his CHF. Gave info to give to his brother about attending CR after home health is done.  Counselor follow up with Mr. Gift today reporting feeling stronger, breathing better and sleeping better - other than his back hurting - since he came into this program.  Counselor and Mr. Gassett discussed his options once he completes this program with Silver Sneakers being an option for consistent exercise.  Mr. Nemetz continues to maintain a positive mood.  Counselor commended him for all of his hard work and progress already made in this program.           Vocational Rehabilitation: Provide vocational rehab assistance to qualifying candidates.   Vocational Rehab Evaluation & Intervention:     Vocational Rehab - 11/19/15 1332    Initial Vocational Rehab Evaluation & Intervention   Assessment shows need for Vocational Rehabilitation No      Education: Education Goals: Education classes will be provided on a weekly basis, covering required topics. Participant will state understanding/return demonstration of topics presented.  Learning Barriers/Preferences:     Learning Barriers/Preferences - 11/19/15 1333    Learning Barriers/Preferences   Learning Barriers Hearing   Learning Preferences None      Education Topics: General Nutrition Guidelines/Fats and Fiber: -Group instruction provided by verbal, written material, models and posters to present the general guidelines for heart healthy nutrition. Gives an explanation and review of dietary fats and fiber.   Controlling Sodium/Reading Food Labels: -Group verbal and written material supporting the discussion of sodium use in heart healthy nutrition. Review and explanation with models, verbal and written materials for utilization of the food label.          Cardiac Rehab from 01/23/2016 in [facility] Cardiac and Pulmonary Rehab   Date  12/31/15   Educator  C. Joneen Caraway, RD   Instruction Review Code  2- meets goals/outcomes      Exercise  Physiology & Risk Factors: - Group verbal and written instruction with models to review the exercise physiology of the cardiovascular system and associated critical values. Details cardiovascular disease risk factors and the goals associated with each risk factor.      Cardiac Rehab from 01/23/2016 in [facility] Cardiac and Pulmonary Rehab   Date  01/09/16   Educator  BS   Instruction Review Code  2- meets goals/outcomes      Aerobic Exercise & Resistance Training: - Gives group verbal and written discussion on the health impact of inactivity. On the components of aerobic and resistive training programs and the benefits of this training and how to safely progress through these programs.      Cardiac Rehab from 01/23/2016 in [facility] Cardiac and Pulmonary Rehab   Date  01/14/16   Educator  RS   Instruction Review Code  2- meets goals/outcomes      Flexibility, Balance, General Exercise Guidelines: - Provides group verbal and written instruction on the benefits of flexibility and balance training programs. Provides general exercise guidelines with specific guidelines to those with heart or lung disease. Demonstration and skill practice provided.      Cardiac Rehab from 01/23/2016 in [facility] Cardiac and Pulmonary Rehab   Date  01/16/16   Educator  bs   Instruction Review Code  2- meets goals/outcomes      Stress Management: - Provides group verbal and written instruction about the health risks of elevated stress, cause of high stress, and healthy ways to reduce stress.  Cardiac Rehab from 01/23/2016 in [facility] Cardiac and Pulmonary Rehab   Date  01/23/16   Educator  [facility]   Instruction Review Code  2- meets goals/outcomes      Depression: - Provides group verbal and written instruction on the correlation between heart/lung disease and depressed mood, treatment options, and the stigmas associated with seeking treatment.      Cardiac Rehab from 01/23/2016 in [facility] Cardiac and Pulmonary Rehab   Date  12/26/15    Educator  Lucianne Lei, MSW   Instruction Review Code  2- meets goals/outcomes      Anatomy & Physiology of the Heart: - Group verbal and written instruction and models provide basic cardiac anatomy and physiology, with the coronary electrical and arterial systems. Review of: AMI, Angina, Valve disease, Heart Failure, Cardiac Arrhythmia, Pacemakers, and the ICD.      Cardiac Rehab from 01/23/2016 in [facility] Cardiac and Pulmonary Rehab   Date  01/21/16   Educator  SB   Instruction Review Code  2- meets goals/outcomes      Cardiac Procedures: - Group verbal and written instruction and models to describe the testing methods done to diagnose heart disease. Reviews the outcomes of the test results. Describes the treatment choices: Medical Management, Angioplasty, or Coronary Bypass Surgery.      Cardiac Rehab from 01/23/2016 in [facility] Cardiac and Pulmonary Rehab   Date  12/10/15   Educator  SB   Instruction Review Code  2- meets goals/outcomes      Cardiac Medications: - Group verbal and written instruction to review commonly prescribed medications for heart disease. Reviews the medication, class of the drug, and side effects. Includes the steps to properly store meds and maintain the prescription regimen.   Go Sex-Intimacy & Heart Disease, Get SMART - Goal Setting: - Group verbal and written instruction through game format to discuss heart disease and the return to sexual intimacy. Provides group verbal and written material to discuss and apply goal setting through the application of the S.M.A.R.T. Method.      Cardiac Rehab from 01/23/2016 in [facility] Cardiac and Pulmonary Rehab   Date  12/10/15   Educator  SB   Instruction Review Code  2- meets goals/outcomes      Other Matters of the Heart: - Provides group verbal, written materials and models to describe Heart Failure, Angina, Valve Disease, and Diabetes in the realm of heart disease. Includes description of the disease process and  treatment options available to the cardiac patient.      Cardiac Rehab from 01/23/2016 in [facility] Cardiac and Pulmonary Rehab   Date  01/21/16   Educator  SB   Instruction Review Code  2- meets goals/outcomes      Exercise & Equipment Safety: - Individual verbal instruction and demonstration of equipment use and safety with use of the equipment.      Cardiac Rehab from 01/23/2016 in [facility] Cardiac and Pulmonary Rehab   Date  11/19/15   Educator  SB   Instruction Review Code  2- meets goals/outcomes      Infection Prevention: - Provides verbal and written material to individual with discussion of infection control including proper hand washing and proper equipment cleaning during exercise session.      Cardiac Rehab from 01/23/2016 in [facility] Cardiac and Pulmonary Rehab   Date  11/19/15   Educator  Sb   Instruction Review Code  2- meets goals/outcomes      Falls Prevention: - Provides verbal and written material  to individual with discussion of falls prevention and safety.      Cardiac Rehab from 01/23/2016 in [facility]   Date  11/19/15   Educator  SB   Instruction Review Code  2- meets goals/outcomes      Diabetes: - Individual verbal and written instruction to review signs/symptoms of diabetes, desired ranges of glucose level fasting, after meals and with exercise. Advice that pre and post exercise glucose checks will be done for 3 sessions at entry of program.    Knowledge Questionnaire Score:     Knowledge Questionnaire Score - 01/21/16 1230    Knowledge Questionnaire Score   Post Score 27      Core Components/Risk Factors/Patient Goals at Admission:     Personal Goals and Risk Factors at Admission - 11/19/15 1313    Core Components/Risk Factors/Patient Goals on Admission    Weight Management Yes   Intervention Weight Management: Develop a combined nutrition and exercise program designed to reach desired caloric intake, while maintaining appropriate  intake of nutrient and fiber, sodium and fats, and appropriate energy expenditure required for the weight goal.;Weight Management: Provide education and appropriate resources to help participant work on and attain dietary goals.;Weight Management/Obesity: Establish reasonable short term and long term weight goals.;Obesity: Provide education and appropriate resources to help participant work on and attain dietary goals.   Admit Weight 202 lb (91.627 kg)   Goal Weight: Short Term 190 lb (86.183 kg)   Goal Weight: Long Term 180 lb (81.647 kg)   Expected Outcomes Short Term: Continue to assess and modify interventions until short term weight is achieved.;Long Term: Adherence to nutrition and physical activity/exercise program aimed toward attainment of established weight goal.   Sedentary Yes   Intervention Provide advice, education, support and counseling about physical activity/exercise needs.;Develop an individualized exercise prescription for aerobic and resistive training based on initial evaluation findings, risk stratification, comorbidities and participant's personal goals.   Expected Outcomes Achievement of increased cardiorespiratory fitness and enhanced flexibility, muscular endurance and strength shown through measurements of functional capaciy and personal statement of participant.   Lipids Yes  Currently taking niacin for low HDL. Reports his levels have increased to over 50 from in the 20's.   Intervention Provide education and support for participant on nutrition & aerobic/resistive exercise along with prescribed medications to achieve LDL <60m, HDL >420m   Expected Outcomes Short Term: Participant states understanding of desired cholesterol values and is compliant with medications prescribed. Participant is following exercise prescription and nutrition guidelines.;Long Term: Cholesterol controlled with medications as prescribed, with individualized exercise RX and with personalized  nutrition plan. Value goals: LDL < 7054mHDL > 40 mg.      Core Components/Risk Factors/Patient Goals Review:      Goals and Risk Factor Review      12/10/15 1548 12/28/15 0905 01/23/16 0953       Core Components/Risk Factors/Patient Goals Review   Personal Goals Review Sedentary;Weight Management/Obesity;Lipids Sedentary;Weight Management/Obesity;Lipids Weight Management/Obesity;Sedentary;Lipids     Review Spoke with DewJoshuahday. He has been here 5 sessions.  PLans to continue with the program and hopes to see progress with weight loss as he sontinues with the exercise and nutrition plan he is providied. He is compliant with his meds and reports that his HDL is improved to greater than 50 from in the 20;s and that his bad cholseterol is low.  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 He is meeing with the Cardiac Rehab registered dietician soon. He said he lost weight in the past by subsituting a Slim Fast for a meal. He needs to eat something every 2 hours since he gets low blood sugars but he knows when his blood sugar drops. He carries peanut butter crackers. I encoured him to carry a piece of candy or some sugar.  Kenry reports more energy to do things around the house and activities outside the home since starting cardiac rehab.  He stated, "I can definitely tell a difference since I started Cardiac Rehab."  Deidre Ala stated he has not lost as much wieght as he thougth he would have.  He weighed 203.1 on admission to Cardiac Rehab and today he weighs 200 pounds.  In terms of his cholesterol Deidre Ala reports having had his cholesterol /lab work drawn this week.  He will see Dr. Sabra Heck on Monday, May 8th.  Deidre Ala also stated his meds usually keep his cholesterol where it needs to be.       Expected Outcomes Continue with the exercise and nutrition plan to maintain risk factor control and working toward weight loss. Geoge wants to get to 180lbs.   Continue exercising to increase stamina; improve cholesterol;  maintain weight/lose weight; and continue cholesterol medication, diet, and exercise for cholesterol management.          Core Components/Risk Factors/Patient Goals at Discharge (Final Review):      Goals and Risk Factor Review - 01/23/16 0953    Core Components/Risk Factors/Patient Goals Review   Personal Goals Review Weight Management/Obesity;Sedentary;Lipids   Review Kentrel reports more energy to do things around the house and activities outside the home since starting cardiac rehab.  He stated, "I can definitely tell a difference since I started Cardiac Rehab."  Deidre Ala stated he has not lost as much wieght as he thougth he would have.  He weighed 203.1 on admission to Cardiac Rehab and today he weighs 200 pounds.  In terms of his cholesterol Deidre Ala reports having had his cholesterol /lab work drawn this week.  He will see Dr. Sabra Heck on Monday, May 8th.  Deidre Ala also stated his meds usually keep his cholesterol where it needs to be.     Expected Outcomes Continue exercising to increase stamina; improve cholesterol;  maintain weight/lose weight; and continue cholesterol medication, diet, and exercise for cholesterol management.        ITP Comments:     ITP Comments      11/19/15 1310 11/21/15 0726 01/04/16 1406 01/13/16 0937 01/30/16 0913   ITP Comments Orientation completed.  Initial ITP  Continue with ITP.     30 day review   Orientation is completed to start sessions this week   Continue with ITP 30 day review 30 day review.  Continue with ITP Varick reported he saw his PMD yesterday and all labs were good.     02/01/16 1107           ITP Comments Discharged today          Comments:

## 2016-02-01 NOTE — Progress Notes (Signed)
Discharge Summary  Patient Details  Name: Justin [name] MRN: [id number] Date of Birth: 1940-11-19 Referring Provider:     Number of Visits: 36  Reason for Discharge:  Patient reached a stable level of exercise. Patient independent in their exercise.  Smoking History:  History  Smoking status  . Former Smoker  . Quit date: 06/26/1990  Smokeless tobacco  . Never Used    Diagnosis:  NSTEMI (non-ST elevated myocardial infarction) ([place])  S/P PTCA (percutaneous transluminal coronary angioplasty)  ADL UCSD:   Initial Exercise Prescription:     Initial Exercise Prescription - 11/19/15 1400    Date of Initial Exercise RX and Referring Provider   Date 11/19/15   Treadmill   MPH 2   Grade 0   Minutes 15   Bike   Level 1   Minutes 15   Recumbant Bike   Level 2   Watts 30   NuStep   Level 3   Watts 40   Minutes 15   Arm Ergometer   Level 1   Watts 8   Minutes 15   Arm/Foot Ergometer   Level 1   Watts 10   Minutes 15   Cybex   Level 2   RPM 40   Minutes 15   Recumbant Elliptical   Level 2   Watts 20   Minutes 15   Elliptical   Level 1   Speed 3   Minutes 5   REL-XR   Level 3   Watts 40   Minutes 15   T5 Nustep   Level 2   Watts 20   Minutes 15   Biostep-RELP   Level 3   Watts 40   Minutes 15   Prescription Details   Frequency (times per week) 3   Duration Progress to 30 minutes of continuous aerobic without signs/symptoms of physical distress   Intensity   THRR REST +  30   Ratings of Perceived Exertion 11-15   Perceived Dyspnea 2-4   Progression   Progression Continue progressive overload as per policy without signs/symptoms or physical distress.   Resistance Training   Training Prescription Yes   Weight 2   Reps 10-15      Discharge Exercise Prescription (Final Exercise Prescription Changes):     Exercise Prescription Changes - 12/31/15 0800    Exercise Review   Progression Yes   Response to Exercise   Blood Pressure (Admit)  120/68 mmHg   Blood Pressure (Exercise) 148/82 mmHg   Blood Pressure (Exit) 116/72 mmHg   Heart Rate (Admit) 85 bpm   Heart Rate (Exercise) 112 bpm   Heart Rate (Exit) 71 bpm   Rating of Perceived Exertion (Exercise) 14   Symptoms None   Comments Reveiwed individual exercise prescription with patient. He tolerated it well with no signs or symptoms.    Duration Progress to 45 minutes of aerobic exercise without signs/symptoms of physical distress   Intensity Rest + 30   Progression   Progression Continue progressive overload as per policy without signs/symptoms or physical distress.   Resistance Training   Training Prescription Yes   Weight 4   Reps 10-15   Interval Training   Interval Training Yes   Equipment REL-XR   Comments Interval Training was recommended to the patient to begin with 2 min x 30 second high intensity intervals on the XR. 30 second interval should be done at a 15 RPE.    Treadmill   MPH 3.4   Grade  2   Minutes 25   Bike   Level --   Minutes --   Recumbant Bike   Level --   Watts --   NuStep   Level --   Watts --   Minutes --   Arm Ergometer   Level --   Watts --   Minutes --   Arm/Foot Ergometer   Level --   Watts --   Minutes --   Cybex   Level --   RPM --   Minutes --   Recumbant Elliptical   Level --   Watts --   Minutes --   Elliptical   Level --   Speed --   Minutes --   REL-XR   Level 10   Watts 124   Minutes 20   T5 Nustep   Level --   Watts --   Minutes --   Biostep-RELP   Level --   Watts --   Minutes --      Functional Capacity:     6 Minute Walk      11/19/15 1420 01/30/16 1251 01/30/16 1349   6 Minute Walk   Phase Initial (p) Discharge    Distance 1300 feet  1710 feet   Distance % Change   32 %   Walk Time 6 minutes  6 minutes   # of Rest Breaks 0  0   RPE 13  14   Symptoms Yes (comment)  No   Comments short of breath and leg weakness     Resting HR 60 bpm  59 bpm   Resting BP 112/72 mmHg  104/62 mmHg    Max Ex. HR 84 bpm  95 bpm   Max Ex. BP 132/78 mmHg  154/70 mmHg      Psychological, QOL, Others - Outcomes: PHQ 2/9: Depression screen PHQ 2/9 11/19/2015  Decreased Interest 0  Down, Depressed, Hopeless 0  PHQ - 2 Score 0  Altered sleeping 0  Tired, decreased energy 3  Change in appetite 3  Feeling bad or failure about yourself  0  Trouble concentrating 0  Moving slowly or fidgety/restless 0  Suicidal thoughts 0  PHQ-9 Score 6  Difficult doing work/chores Somewhat difficult    Quality of Life:     Quality of Life - 01/21/16 1230    Quality of Life Scores   Health/Function Post 21.6 %   Socioeconomic Post 23.14 %   Psych/Spiritual Post 23.57 %   Family Post 23.5 %   GLOBAL Post 22.6 %      Personal Goals: Goals established at orientation with interventions provided to work toward goal.     Personal Goals and Risk Factors at Admission - 11/19/15 1313    Core Components/Risk Factors/Patient Goals on Admission    Weight Management Yes   Intervention Weight Management: Develop a combined nutrition and exercise program designed to reach desired caloric intake, while maintaining appropriate intake of nutrient and fiber, sodium and fats, and appropriate energy expenditure required for the weight goal.;Weight Management: Provide education and appropriate resources to help participant work on and attain dietary goals.;Weight Management/Obesity: Establish reasonable short term and long term weight goals.;Obesity: Provide education and appropriate resources to help participant work on and attain dietary goals.   Admit Weight 202 lb (91.627 kg)   Goal Weight: Short Term 190 lb (86.183 kg)   Goal Weight: Long Term 180 lb (81.647 kg)   Expected Outcomes Short Term: Continue to assess and modify interventions until short  term weight is achieved.;Long Term: Adherence to nutrition and physical activity/exercise program aimed toward attainment of established weight goal.   Sedentary Yes    Intervention Provide advice, education, support and counseling about physical activity/exercise needs.;Develop an individualized exercise prescription for aerobic and resistive training based on initial evaluation findings, risk stratification, comorbidities and participant's personal goals.   Expected Outcomes Achievement of increased cardiorespiratory fitness and enhanced flexibility, muscular endurance and strength shown through measurements of functional capaciy and personal statement of participant.   Lipids Yes  Currently taking niacin for low HDL. Reports his levels have increased to over 50 from in the 20's.   Intervention Provide education and support for participant on nutrition & aerobic/resistive exercise along with prescribed medications to achieve LDL 70mg , HDL >40mg .   Expected Outcomes Short Term: Participant states understanding of desired cholesterol values and is compliant with medications prescribed. Participant is following exercise prescription and nutrition guidelines.;Long Term: Cholesterol controlled with medications as prescribed, with individualized exercise RX and with personalized nutrition plan. Value goals: LDL < 70mg , HDL > 40 mg.       Personal Goals Discharge:     Goals and Risk Factor Review      12/10/15 1548 12/28/15 0905 01/23/16 0953       Core Components/Risk Factors/Patient Goals Review   Personal Goals Review Sedentary;Weight Management/Obesity;Lipids Sedentary;Weight Management/Obesity;Lipids Weight Management/Obesity;Sedentary;Lipids     Review Spoke with Justin [name] today. He has been here 5 sessions.  PLans to continue with the program and hopes to see progress with weight loss as he sontinues with the exercise and nutrition plan he is providied. He is compliant with his meds and reports that his HDL is improved to greater than 50 from in the 20;s and that his bad cholseterol is low.  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 He is meeing with the Cardiac Rehab registered dietician soon. He said he lost weight in the past by subsituting a Slim Fast for a meal. He needs to eat something every 2 hours since he gets low blood sugars but he knows when his blood sugar drops. He carries peanut butter crackers. I encoured him to carry a piece of candy or some sugar.  Justin [name] reports more energy to do things around the house and activities outside the home since starting cardiac rehab.  He stated, "I can definitely tell a difference since I started Cardiac Rehab."  Justin [name] stated he has not lost as much wieght as he thougth he would have.  He weighed 203.1 on admission to Cardiac Rehab and today he weighs 200 pounds.  In terms of his cholesterol Justin [name] reports having had his cholesterol /lab work drawn this week.  He will see Dr. Sabra Heck on Monday, May 8th.  Justin [name] also stated his meds usually keep his cholesterol where it needs to be.       Expected Outcomes Continue with the exercise and nutrition plan to maintain risk factor control and working toward weight loss. Jamen wants to get to 180lbs.  Continue exercising to increase stamina; improve cholesterol;  maintain weight/lose weight; and continue cholesterol medication, diet, and exercise for cholesterol management.          Nutrition & Weight - Outcomes:     Pre Biometrics - 11/19/15 1425    Pre Biometrics   Height 5\' 9"  (1.753 m)   Weight 202 lb 9.6 oz (91.899 kg)   Waist Circumference 44 inches  Hip Circumference 43.5 inches   Waist to Hip Ratio 1.01 %   BMI (Calculated) 30       Nutrition:     Nutrition Therapy & Goals - 01/08/16 1652    Nutrition Therapy   Diet Heart Healthy dietary guidelines based on 1700 calories   Drug/Food Interactions Statins/Certain Fruits   Protein (specify units) 7   Fiber 30 grams   Whole Grain Foods 3 servings   Saturated Fats 12 max. grams   Fruits and Vegetables 5 servings/day   Sodium 1500 grams    Personal Nutrition Goals   Personal Goal #1 Include more whole grains through 100% whole wheat bread, oatmeal, 1/2 cup brown rice, corn, popcorn   Personal Goal #2 Include at least 7 oz.of a protein food. Refer to list   Personal Goal #3 Include a protein food at breakfast to help avoid low blood sugar.   Personal Goal #4 Add 1-2 servings of fruit per day.   Intervention Plan   Intervention Prescribe, educate and counsel regarding individualized specific dietary modifications aiming towards targeted core components such as weight, hypertension, lipid management, diabetes, heart failure and other comorbidities.;Nutrition handout(s) given to patient.   Expected Outcomes Short Term Goal: A plan has been developed with personal nutrition goals set during dietitian appointment.;Short Term Goal: Understand basic principles of dietary content, such as calories, fat, sodium, cholesterol and nutrients.;Long Term Goal: Adherence to prescribed nutrition plan.      Nutrition Discharge:     Nutrition Assessments - 01/21/16 1230    Rate Your Plate Scores   Post Score 68   Post Score % 75.5 %      Education Questionnaire Score:     Knowledge Questionnaire Score - 01/21/16 1230    Knowledge Questionnaire Score   Post Score 27      Goals reviewed with patient; copy given to patient.

## 2017-02-24 ENCOUNTER — Other Ambulatory Visit: Payer: Self-pay | Admitting: Orthopedic Surgery

## 2017-02-24 DIAGNOSIS — M545 Low back pain, unspecified: Secondary | ICD-10-CM

## 2017-03-03 ENCOUNTER — Telehealth: Payer: Self-pay | Admitting: *Deleted

## 2017-03-03 ENCOUNTER — Ambulatory Visit
Admission: RE | Admit: 2017-03-03 | Discharge: 2017-03-03 | Disposition: A | Discharge status: Home / Self Care | Payer: Medicare Other | Source: Ambulatory Visit | Attending: Orthopedic Surgery | Admitting: Orthopedic Surgery

## 2017-03-03 DIAGNOSIS — M545 Low back pain, unspecified: Secondary | ICD-10-CM

## 2017-03-03 DIAGNOSIS — M5126 Other intervertebral disc displacement, lumbar region: Secondary | ICD-10-CM | POA: Insufficient documentation

## 2017-03-03 DIAGNOSIS — M4316 Spondylolisthesis, lumbar region: Secondary | ICD-10-CM | POA: Insufficient documentation

## 2017-03-03 DIAGNOSIS — R609 Edema, unspecified: Secondary | ICD-10-CM | POA: Insufficient documentation

## 2017-03-03 NOTE — Telephone Encounter (Signed)
Pt states Dr. Paulla Dolly put a pin in his toe 4-5 years ago and he is to have a MRI tonight will it be okay. I spoke with Dr. Cannon Kettle and she said to be safe get CT. I spoke with pt, gave Dr. Leeanne Rio recommendation. Pt states had a MRI in 2016 at the [place] and he guess it was okay, and asked what metal was put in and I told him we did not have the records for that far back, but routinely titanium was used. Pt asked if it would be okay and I told him I could only give Dr. Leeanne Rio recommendation and he should discuss with the radiologist or doctor.

## 2017-03-24 ENCOUNTER — Ambulatory Visit: Admit: 2017-03-24 | Payer: Medicare Other | Admitting: Unknown Physician Specialty

## 2017-03-26 ENCOUNTER — Encounter: Payer: Self-pay | Admitting: *Deleted

## 2017-03-27 ENCOUNTER — Ambulatory Visit
Admission: RE | Admit: 2017-03-27 | Discharge: 2017-03-27 | Disposition: A | Discharge status: Home / Self Care | Payer: Medicare Other | Source: Ambulatory Visit | Attending: Unknown Physician Specialty | Admitting: Unknown Physician Specialty

## 2017-03-27 ENCOUNTER — Encounter
Admission: RE | Disposition: A | Discharge status: Home / Self Care | Payer: Self-pay | Source: Ambulatory Visit | Attending: Unknown Physician Specialty

## 2017-03-27 ENCOUNTER — Encounter: Payer: Self-pay | Admitting: *Deleted

## 2017-03-27 ENCOUNTER — Ambulatory Visit: Payer: Medicare Other | Admitting: Anesthesiology

## 2017-03-27 DIAGNOSIS — K2970 Gastritis, unspecified, without bleeding: Secondary | ICD-10-CM | POA: Insufficient documentation

## 2017-03-27 DIAGNOSIS — I252 Old myocardial infarction: Secondary | ICD-10-CM | POA: Diagnosis not present

## 2017-03-27 DIAGNOSIS — K640 First degree hemorrhoids: Secondary | ICD-10-CM | POA: Diagnosis not present

## 2017-03-27 DIAGNOSIS — D122 Benign neoplasm of ascending colon: Secondary | ICD-10-CM | POA: Insufficient documentation

## 2017-03-27 DIAGNOSIS — I2510 Atherosclerotic heart disease of native coronary artery without angina pectoris: Secondary | ICD-10-CM | POA: Insufficient documentation

## 2017-03-27 DIAGNOSIS — K3189 Other diseases of stomach and duodenum: Secondary | ICD-10-CM | POA: Diagnosis not present

## 2017-03-27 DIAGNOSIS — D509 Iron deficiency anemia, unspecified: Secondary | ICD-10-CM | POA: Diagnosis not present

## 2017-03-27 DIAGNOSIS — K5730 Diverticulosis of large intestine without perforation or abscess without bleeding: Secondary | ICD-10-CM | POA: Insufficient documentation

## 2017-03-27 DIAGNOSIS — Z1211 Encounter for screening for malignant neoplasm of colon: Secondary | ICD-10-CM | POA: Diagnosis not present

## 2017-03-27 DIAGNOSIS — K219 Gastro-esophageal reflux disease without esophagitis: Secondary | ICD-10-CM | POA: Diagnosis not present

## 2017-03-27 DIAGNOSIS — K317 Polyp of stomach and duodenum: Secondary | ICD-10-CM | POA: Diagnosis not present

## 2017-03-27 DIAGNOSIS — K6389 Other specified diseases of intestine: Secondary | ICD-10-CM | POA: Diagnosis not present

## 2017-03-27 DIAGNOSIS — Z7982 Long term (current) use of aspirin: Secondary | ICD-10-CM | POA: Diagnosis not present

## 2017-03-27 DIAGNOSIS — Z7951 Long term (current) use of inhaled steroids: Secondary | ICD-10-CM | POA: Insufficient documentation

## 2017-03-27 DIAGNOSIS — K449 Diaphragmatic hernia without obstruction or gangrene: Secondary | ICD-10-CM | POA: Insufficient documentation

## 2017-03-27 DIAGNOSIS — Z951 Presence of aortocoronary bypass graft: Secondary | ICD-10-CM | POA: Diagnosis not present

## 2017-03-27 DIAGNOSIS — Z87891 Personal history of nicotine dependence: Secondary | ICD-10-CM | POA: Insufficient documentation

## 2017-03-27 DIAGNOSIS — D123 Benign neoplasm of transverse colon: Secondary | ICD-10-CM | POA: Insufficient documentation

## 2017-03-27 DIAGNOSIS — Z79899 Other long term (current) drug therapy: Secondary | ICD-10-CM | POA: Diagnosis not present

## 2017-03-27 DIAGNOSIS — Z86010 Personal history of colonic polyps: Secondary | ICD-10-CM | POA: Insufficient documentation

## 2017-03-27 DIAGNOSIS — G4730 Sleep apnea, unspecified: Secondary | ICD-10-CM | POA: Insufficient documentation

## 2017-03-27 DIAGNOSIS — N189 Chronic kidney disease, unspecified: Secondary | ICD-10-CM | POA: Diagnosis not present

## 2017-03-27 DIAGNOSIS — J449 Chronic obstructive pulmonary disease, unspecified: Secondary | ICD-10-CM | POA: Insufficient documentation

## 2017-03-27 HISTORY — PX: ESOPHAGOGASTRODUODENOSCOPY (EGD) WITH PROPOFOL: SHX5813

## 2017-03-27 HISTORY — DX: Chronic kidney disease, unspecified: N18.9

## 2017-03-27 HISTORY — DX: Gastro-esophageal reflux disease without esophagitis: K21.9

## 2017-03-27 HISTORY — DX: Chronic obstructive pulmonary disease, unspecified: J44.9

## 2017-03-27 HISTORY — DX: Sleep apnea, unspecified: G47.30

## 2017-03-27 HISTORY — PX: COLONOSCOPY WITH PROPOFOL: SHX5780

## 2017-03-27 HISTORY — DX: Acute myocardial infarction, unspecified: I21.9

## 2017-03-27 SURGERY — ESOPHAGOGASTRODUODENOSCOPY (EGD) WITH PROPOFOL
Anesthesia: General

## 2017-03-27 MED ORDER — PROPOFOL 10 MG/ML IV BOLUS
INTRAVENOUS | Status: DC | PRN
  Administered 2017-03-27: 80 mg via INTRAVENOUS

## 2017-03-27 MED ORDER — PHENYLEPHRINE HCL 10 MG/ML IJ SOLN
INTRAMUSCULAR | Status: AC
  Filled 2017-03-27: qty 1

## 2017-03-27 MED ORDER — PROPOFOL 500 MG/50ML IV EMUL
INTRAVENOUS | Status: DC | PRN
  Administered 2017-03-27: 120 ug/kg/min via INTRAVENOUS

## 2017-03-27 MED ORDER — EPHEDRINE SULFATE 50 MG/ML IJ SOLN
INTRAMUSCULAR | Status: AC
  Filled 2017-03-27: qty 1

## 2017-03-27 MED ORDER — FENTANYL CITRATE (PF) 100 MCG/2ML IJ SOLN
INTRAMUSCULAR | Status: DC | PRN
  Administered 2017-03-27: 50 ug via INTRAVENOUS

## 2017-03-27 MED ORDER — EPHEDRINE SULFATE-NACL 50-0.9 MG/10ML-% IV SOSY
PREFILLED_SYRINGE | INTRAVENOUS | Status: DC | PRN
  Administered 2017-03-27 (×5): 10 mg via INTRAVENOUS

## 2017-03-27 MED ORDER — SODIUM CHLORIDE 0.9 % IV SOLN: INTRAVENOUS | Status: DC

## 2017-03-27 MED ORDER — FENTANYL CITRATE (PF) 100 MCG/2ML IJ SOLN
INTRAMUSCULAR | Status: AC
  Filled 2017-03-27: qty 2

## 2017-03-27 MED ORDER — GLYCOPYRROLATE 0.2 MG/ML IJ SOLN
INTRAMUSCULAR | Status: DC | PRN
  Administered 2017-03-27: 0.2 mg via INTRAVENOUS

## 2017-03-27 MED ORDER — SODIUM CHLORIDE 0.9 % IV SOLN
INTRAVENOUS | Status: DC
  Administered 2017-03-27: 07:00:00 via INTRAVENOUS

## 2017-03-27 MED ORDER — PROPOFOL 500 MG/50ML IV EMUL
INTRAVENOUS | Status: AC
  Filled 2017-03-27: qty 50

## 2017-03-27 NOTE — Op Note (Signed)
[facility] Gastroenterology Patient Name: Justin [name] Procedure Date: 03/27/2017 7:29 AM MRN: [id number] Account #: [id number] Date of Birth: 1941/08/24 Admit Type: Outpatient Age: 76 Room: [facility] ENDO ROOM 3 Gender: Male Note Status: Finalized Procedure:            Colonoscopy Indications:          High risk colon cancer surveillance: Personal history                        of colonic polyps Providers:            Manya Silvas, MD Referring MD:         Rusty Aus, MD (Referring MD) Medicines:            Propofol per Anesthesia Complications:        No immediate complications. Procedure:            Pre-Anesthesia Assessment:                       - After reviewing the risks and benefits, the patient                        was deemed in satisfactory condition to undergo the                        procedure.                       After obtaining informed consent, the colonoscope was                        passed under direct vision. Throughout the procedure,                        the patient's blood pressure, pulse, and oxygen                        saturations were monitored continuously. The                        Colonoscope was introduced through the anus and                        advanced to the the cecum, identified by appendiceal                        orifice and ileocecal valve. The colonoscopy was                        performed without difficulty. The patient tolerated the                        procedure well. The quality of the bowel preparation                        was excellent. Findings:      Nine sessile polyps were found in the transverse colon, hepatic flexure       and ascending colon. The polyps were diminutive in size. These polyps       were removed with a jumbo cold forceps. Resection and retrieval were  complete.      Four sessile polyps were found in the transverse colon and ascending       colon. The polyps were small in  size. These polyps were removed with a       hot snare. Resection and retrieval were complete.      A small polyp was found in the splenic flexure. The polyp was sessile.       The polyp was removed with a hot snare. Resection and retrieval were       complete.      Multiple small-mouthed diverticula were found in the sigmoid colon.      Internal hemorrhoids were found during endoscopy. The hemorrhoids were       small and Grade I (internal hemorrhoids that do not prolapse).      A diffuse area of moderate melanosis was found in the sigmoid colon, in       the descending colon, in the transverse colon, in the ascending colon       and in the cecum. Impression:           - Nine diminutive polyps in the transverse colon, at                        the hepatic flexure and in the ascending colon, removed                        with a jumbo cold forceps. Resected and retrieved.                       - Four small polyps in the transverse colon and in the                        ascending colon, removed with a hot snare. Resected and                        retrieved.                       - One small polyp at the splenic flexure, removed with                        a hot snare. Resected and retrieved.                       - Diverticulosis in the sigmoid colon.                       - Internal hemorrhoids.                       - Melanosis in the colon. Recommendation:       - Await pathology results. Manya Silvas, MD 03/27/2017 8:24:05 AM This report has been signed electronically. Number of Addenda: 0 Note Initiated On: 03/27/2017 7:29 AM Scope Withdrawal Time: 0 hours 2 minutes 17 seconds  Total Procedure Duration: 0 hours 29 minutes 12 seconds       [facility]

## 2017-03-27 NOTE — Op Note (Signed)
[facility] Gastroenterology Patient Name: Justin [name] Procedure Date: 03/27/2017 7:30 AM MRN: [id number] Account #: [id number] Date of Birth: [birth year] Admit Type: Outpatient Age: 76 Room: [facility] ENDO ROOM 3 Gender: Male Note Status: Finalized Procedure:            Upper GI endoscopy Indications:          Iron deficiency anemia Providers:            Manya Silvas, MD Referring MD:         Rusty Aus, MD (Referring MD) Medicines:            Propofol per Anesthesia Complications:        No immediate complications. Procedure:            Pre-Anesthesia Assessment:                       - After reviewing the risks and benefits, the patient                        was deemed in satisfactory condition to undergo the                        procedure.                       After obtaining informed consent, the endoscope was                        passed under direct vision. Throughout the procedure,                        the patient's blood pressure, pulse, and oxygen                        saturations were monitored continuously. The Endoscope                        was introduced through the mouth, and advanced to the                        second part of duodenum. The upper GI endoscopy was                        accomplished without difficulty. The patient tolerated                        the procedure well. Findings:      The examined esophagus was normal.GEJ 38-39cm.      A small hiatal hernia was present.      Striped mildly erythematous mucosa without bleeding was found in the       gastric antrum. Biopsies were taken with a cold forceps for histology.       Biopsies were taken with a cold forceps for Helicobacter pylori testing.      Localized mild inflammation characterized by erythema and granularity       was found in the gastric body. Biopsies were taken with a cold forceps       for histology. Biopsies were taken with a cold forceps for Helicobacter        pylori testing.      The examined duodenum was  normal. Impression:           - Normal esophagus.                       - Small hiatal hernia.                       - Erythematous mucosa in the antrum. Biopsied.                       - Gastritis. Biopsied.                       - Normal examined duodenum. Recommendation:       - Await pathology results. Manya Silvas, MD 03/27/2017 7:47:58 AM This report has been signed electronically. Number of Addenda: 0 Note Initiated On: 03/27/2017 7:30 AM      [facility]

## 2017-03-27 NOTE — Anesthesia Postprocedure Evaluation (Signed)
Anesthesia Post Note  Patient: ZAKERY NORMINGTON  Procedure(s) Performed: Procedure(s) (LRB): ESOPHAGOGASTRODUODENOSCOPY (EGD) WITH PROPOFOL (N/A) COLONOSCOPY WITH PROPOFOL (N/A)  Patient location during evaluation: Endoscopy Anesthesia Type: General Level of consciousness: awake and alert Pain management: pain level controlled Vital Signs Assessment: post-procedure vital signs reviewed and stable Respiratory status: spontaneous breathing, nonlabored ventilation, respiratory function stable and patient connected to nasal cannula oxygen Cardiovascular status: blood pressure returned to baseline and stable Postop Assessment: no signs of nausea or vomiting Anesthetic complications: no     Last Vitals:  Vitals:   03/27/17 0823 03/27/17 0833  BP: 112/62 110/67  Pulse: 67 68  Resp: 18 16  Temp: (!) 36 C     Last Pain:  Vitals:   03/27/17 0823  TempSrc: Tympanic                 Edona Schreffler S

## 2017-03-27 NOTE — Anesthesia Preprocedure Evaluation (Addendum)
Anesthesia Evaluation  Patient identified by MRN, date of birth, ID band Patient awake    Reviewed: Allergy & Precautions, NPO status , Patient's Chart, lab work & pertinent test results, reviewed documented beta blocker date and time   Airway Mallampati: II  TM Distance: >3 FB     Dental  (+) Chipped, Partial Upper, Poor Dentition, Missing   Pulmonary sleep apnea , pneumonia, resolved, COPD, former smoker,           Cardiovascular + CAD, + Past MI and + CABG       Neuro/Psych    GI/Hepatic GERD  ,  Endo/Other    Renal/GU Renal disease     Musculoskeletal  (+) Arthritis ,   Abdominal   Peds  Hematology   Anesthesia Other Findings   Reproductive/Obstetrics                            Anesthesia Physical Anesthesia Plan  ASA: III  Anesthesia Plan: General   Post-op Pain Management:    Induction: Intravenous  PONV Risk Score and Plan:   Airway Management Planned:   Additional Equipment:   Intra-op Plan:   Post-operative Plan:   Informed Consent: I have reviewed the patients History and Physical, chart, labs and discussed the procedure including the risks, benefits and alternatives for the proposed anesthesia with the patient or authorized representative who has indicated his/her understanding and acceptance.     Plan Discussed with: CRNA  Anesthesia Plan Comments:         Anesthesia Quick Evaluation

## 2017-03-27 NOTE — Anesthesia Post-op Follow-up Note (Cosign Needed)
Anesthesia QCDR form completed.        

## 2017-03-27 NOTE — Transfer of Care (Signed)
Immediate Anesthesia Transfer of Care Note  Patient: Justin [name]  Procedure(s) Performed: Procedure(s): ESOPHAGOGASTRODUODENOSCOPY (EGD) WITH PROPOFOL (N/A) COLONOSCOPY WITH PROPOFOL (N/A)  Patient Location: PACU and Endoscopy Unit  Anesthesia Type:General  Level of Consciousness: awake, alert  and oriented  Airway & Oxygen Therapy: Patient Spontanous Breathing and Patient connected to nasal cannula oxygen  Post-op Assessment: Report given to RN and Post -op Vital signs reviewed and stable  Post vital signs: Reviewed and stable  Last Vitals:  Vitals:   03/27/17 0644 03/27/17 0823  BP: 122/80 112/62  Pulse: (!) 55 67  Resp: 18 18  Temp: (!) 36.1 C (!) 36 C    Last Pain:  Vitals:   03/27/17 0823  TempSrc: Tympanic         Complications: No apparent anesthesia complications

## 2017-03-27 NOTE — H&P (Signed)
Primary Care Physician:  Rusty Aus, MD Primary Gastroenterologist:  Dr. Vira Agar  Pre-Procedure History & Physical: HPI:  Justin [name] is a 76 y.o. male is here for an endoscopy and colonoscopy.   Past Medical History:  Diagnosis Date  . Arthritis   . CAD (coronary artery disease)   . Chronic kidney disease   . COPD (chronic obstructive pulmonary disease) ([place])   . DJD (degenerative joint disease)   . GERD (gastroesophageal reflux disease)   . History of open heart surgery   . Myocardial infarction ([place])   . Skin cancer   . Sleep apnea     Past Surgical History:  Procedure Laterality Date  . BACK SURGERY    . CARDIAC CATHETERIZATION N/A 11/07/2015   Procedure: Left Heart Cath and Coronary Angiography;  Surgeon: Teodoro Spray, MD;  Location: [place] [facility];  Service: Cardiovascular;  Laterality: N/A;  . CARDIAC CATHETERIZATION N/A 11/07/2015   Procedure: Coronary Stent Intervention;  Surgeon: Isaias Cowman, MD;  Location: [place] [facility];  Service: Cardiovascular;  Laterality: N/A;  . CATARACT EXTRACTION    . CHOLECYSTECTOMY    . CORONARY ANGIOPLASTY    . CORONARY ARTERY BYPASS GRAFT    . EYE SURGERY    . HERNIA REMOVED    . HERNIA REPAIR    . kissing spine syndrome    . kyphoplasty t10 t11    . OPEN HEART SURGERY    . RETROPUBIC PROSTATECTOMY    . SKIN CANCER REMOVED      Prior to Admission medications   Medication Sig Start Date End Date Taking? Authorizing Provider  acetaminophen (TYLENOL) 325 MG tablet Take 650 mg by mouth 3 (three) times daily.    Yes [provider]  aspirin EC 325 MG EC tablet Take 1 tablet (325 mg total) by mouth daily. 11/08/15  Yes Gouru, Illene Silver, MD  atorvastatin (LIPITOR) 80 MG tablet Take 40 mg by mouth at bedtime.    Yes [provider]  Calcium Carb-Cholecalciferol (CALCIUM 600 + D PO) Take 1 tablet by mouth 2 (two) times daily. Take morning and at noon.   Yes [provider]   cholecalciferol (VITAMIN D) 1000 units tablet Take 1,000 Units by mouth 3 (three) times daily.   Yes [provider]  fexofenadine (ALLEGRA) 180 MG tablet Take 180 mg by mouth daily before breakfast.    Yes [provider]  fluticasone (FLONASE) 50 MCG/ACT nasal spray Place 2 sprays into both nostrils at bedtime.   Yes [provider]  isosorbide mononitrate (IMDUR) 60 MG 24 hr tablet Take 60 mg by mouth daily.   Yes [provider]  LACTOBACILLUS PO Take 2 tablets by mouth every morning.    Yes [provider]  Magnesium 400 MG CAPS Take 400 mg by mouth 2 (two) times daily.    Yes [provider]  metoprolol (LOPRESSOR) 50 MG tablet Take 50 mg by mouth 2 (two) times daily.   Yes [provider]  niacin 500 MG tablet Take 500 mg by mouth 3 (three) times daily. Take one tablet in the morning, take one tablet in the afternoon and take one tablet at night   Yes [provider]  Omega-3 Fatty Acids (FISH OIL) 1000 MG CAPS Take 1,000 mg by mouth 2 (two) times daily.   Yes [provider]  omeprazole (PRILOSEC) 20 MG capsule Take 20 mg by mouth 2 (two) times daily before a meal.  Yes [provider]  senna-docusate (SENOKOT-S) 8.6-50 MG per tablet Take 2 tablets by mouth 2 (two) times daily. 4 TIMES DAILY   Yes [provider]  traMADol (ULTRAM) 50 MG tablet Take 50 mg by mouth 3 (three) times daily.    Yes [provider]  vitamin C (ASCORBIC ACID) 500 MG tablet Take 500 mg by mouth daily.   Yes [provider]  clopidogrel (PLAVIX) 75 MG tablet Take 1 tablet (75 mg total) by mouth daily. Patient not taking: Reported on 03/27/2017 11/08/15   Nicholes Mango, MD  levofloxacin (LEVAQUIN) 750 MG tablet Take 1 tablet (750 mg total) by mouth daily. Patient not taking: Reported on 11/19/2015 11/08/15   Nicholes Mango, MD  nitroGLYCERIN (NITROSTAT) 0.4 MG SL tablet Place 0.4 mg under the tongue every 5  (five) minutes x 3 doses as needed for chest pain. If no relief call MD or go to emergency room.    [provider]  Zoledronic Acid (RECLAST IV) Inject into the vein. Injection once yearly    [provider]    Allergies as of 01/12/2017  . (No Known Allergies)    Family History  Problem Relation Age of Onset  . Cancer Father     Social History   Social History  . Marital status: Married    Spouse name: N/A  . Number of children: N/A  . Years of education: N/A   Occupational History  . Not on file.   Social History Main Topics  . Smoking status: Former Smoker    Quit date: 06/26/1990  . Smokeless tobacco: Never Used  . Alcohol use No  . Drug use: No  . Sexual activity: Not on file   Other Topics Concern  . Not on file   Social History Narrative  . No narrative on file    Review of Systems: See HPI, otherwise negative ROS  Physical Exam: BP 122/80   Pulse (!) 55   Temp (!) 97 F (36.1 C) (Tympanic)   Resp 18   SpO2 99%  General:   Alert,  pleasant and cooperative in NAD Head:  Normocephalic and atraumatic. Neck:  Supple; no masses or thyromegaly. Lungs:  Clear throughout to auscultation.    Heart:  Regular rate and rhythm. Abdomen:  Soft, nontender and nondistended. Normal bowel sounds, without guarding, and without rebound.   Neurologic:  Alert and  oriented x4;  grossly normal neurologically.  Impression/Plan: [name] is here for an endoscopy and colonoscopy to be performed for [facility] colon polyps and low ferritin level.  Risks, benefits, limitations, and alternatives regarding  endoscopy and colonoscopy have been reviewed with the patient.  Questions have been answered.  All parties agreeable.   Gaylyn Cheers, MD  03/27/2017, 7:26 AM

## 2017-03-30 LAB — SURGICAL PATHOLOGY

## 2017-03-31 ENCOUNTER — Encounter: Payer: Self-pay | Admitting: Unknown Physician Specialty

## 2017-04-06 SURGERY — ESOPHAGOGASTRODUODENOSCOPY (EGD) WITH PROPOFOL
Anesthesia: General

## 2017-06-11 ENCOUNTER — Encounter
Admission: RE | Admit: 2017-06-11 | Discharge: 2017-06-11 | Disposition: A | Discharge status: Home / Self Care | Payer: Medicare Other | Source: Ambulatory Visit | Attending: Internal Medicine | Admitting: Internal Medicine

## 2017-06-11 ENCOUNTER — Other Ambulatory Visit: Payer: Self-pay | Admitting: Internal Medicine

## 2017-06-11 DIAGNOSIS — R079 Chest pain, unspecified: Secondary | ICD-10-CM | POA: Insufficient documentation

## 2017-06-11 LAB — POCT I-STAT CREATININE: CREATININE: 1 mg/dL (ref 0.61–1.24)

## 2017-06-11 MED ORDER — IOPAMIDOL (ISOVUE-370) INJECTION 76%
80.0000 mL | Freq: Once | INTRAVENOUS | Status: AC | PRN
  Administered 2017-06-11: 80 mL via INTRAVENOUS

## 2017-07-30 DIAGNOSIS — Z8781 Personal history of (healed) traumatic fracture: Secondary | ICD-10-CM | POA: Insufficient documentation

## 2017-08-24 ENCOUNTER — Other Ambulatory Visit: Payer: Self-pay | Admitting: Podiatry

## 2017-09-02 NOTE — Discharge Instructions (Signed)
[facility] °[facility] ° °POST OPERATIVE INSTRUCTIONS FOR DR. TROXLER AND DR. FOWLER °[facility] ° ° °1. Take your medication as prescribed.  Pain medication should be taken only as needed. ° °2. Keep the dressing clean, dry and intact. ° °3. Keep your foot elevated above the heart level for the first 48 hours. ° °4. Walking to the bathroom and brief periods of walking are acceptable, unless we have instructed you to be non-weight bearing. ° °5. Always wear your post-op shoe when walking.  Always use your crutches if you are to be non-weight bearing. ° °6. Do not take a shower. Baths are permissible as long as the foot is kept out of the water.  ° °7. Every hour you are awake:  °- Bend your knee 15 times. °- Flex foot 15 times °- Massage calf 15 times ° °8. Call [facility] ([phone]) if any of the following problems occur: °- You develop a temperature or fever. °- The bandage becomes saturated with blood. °- Medication does not stop your pain. °- Injury of the foot occurs. °- Any symptoms of infection including redness, odor, or red streaks running from wound. ° ° °General Anesthesia, Adult, Care After °These instructions provide you with information about caring for yourself after your procedure. Your health care provider may also give you more specific instructions. Your treatment has been planned according to current medical practices, but problems sometimes occur. Call your health care provider if you have any problems or questions after your procedure. °What can I expect after the procedure? °After the procedure, it is common to have: °· Vomiting. °· A sore throat. °· Mental slowness. ° °It is common to feel: °· Nauseous. °· Cold or shivery. °· Sleepy. °· Tired. °· Sore or achy, even in parts of your body where you did not have surgery. ° °Follow these instructions at home: °For at least 24 hours after the procedure: °· Do not: °? Participate in  activities where you could fall or become injured. °? Drive. °? Use heavy machinery. °? Drink alcohol. °? Take sleeping pills or medicines that cause drowsiness. °? Make important decisions or sign legal documents. °? Take care of children on your own. °· Rest. °Eating and drinking °· If you vomit, drink water, juice, or soup when you can drink without vomiting. °· Drink enough fluid to keep your urine clear or pale yellow. °· Make sure you have little or no nausea before eating solid foods. °· Follow the diet recommended by your health care provider. °General instructions °· Have a responsible adult stay with you until you are awake and alert. °· Return to your normal activities as told by your health care provider. Ask your health care provider what activities are safe for you. °· Take over-the-counter and prescription medicines only as told by your health care provider. °· If you smoke, do not smoke without supervision. °· Keep all follow-up visits as told by your health care provider. This is important. °Contact a health care provider if: °· You continue to have nausea or vomiting at home, and medicines are not helpful. °· You cannot drink fluids or start eating again. °· You cannot urinate after 8-12 hours. °· You develop a skin rash. °· You have fever. °· You have increasing redness at the site of your procedure. °Get help right away if: °· You have difficulty breathing. °· You have chest pain. °· You have unexpected bleeding. °· You feel that you   are having a life-threatening or urgent problem. °This information is not intended to replace advice given to you by your health care provider. Make sure you discuss any questions you have with your health care provider. °Document Released: 12/15/2000 Document Revised: 02/11/2016 Document Reviewed: 08/23/2015 °Elsevier Interactive Patient Education © 2018 [employer] ° °

## 2017-09-07 ENCOUNTER — Other Ambulatory Visit: Payer: Self-pay

## 2017-09-07 ENCOUNTER — Encounter: Payer: Self-pay | Admitting: *Deleted

## 2017-09-10 ENCOUNTER — Ambulatory Visit
Admission: RE | Admit: 2017-09-10 | Discharge: 2017-09-10 | Disposition: A | Discharge status: Home / Self Care | Payer: Medicare Other | Source: Ambulatory Visit | Attending: Podiatry | Admitting: Podiatry

## 2017-09-10 ENCOUNTER — Encounter
Admission: RE | Disposition: A | Discharge status: Home / Self Care | Payer: Self-pay | Source: Ambulatory Visit | Attending: Podiatry

## 2017-09-10 ENCOUNTER — Ambulatory Visit: Payer: Medicare Other | Admitting: Anesthesiology

## 2017-09-10 DIAGNOSIS — I2510 Atherosclerotic heart disease of native coronary artery without angina pectoris: Secondary | ICD-10-CM | POA: Diagnosis not present

## 2017-09-10 DIAGNOSIS — M79671 Pain in right foot: Secondary | ICD-10-CM | POA: Insufficient documentation

## 2017-09-10 DIAGNOSIS — E785 Hyperlipidemia, unspecified: Secondary | ICD-10-CM | POA: Insufficient documentation

## 2017-09-10 DIAGNOSIS — I252 Old myocardial infarction: Secondary | ICD-10-CM | POA: Insufficient documentation

## 2017-09-10 DIAGNOSIS — M25774 Osteophyte, right foot: Secondary | ICD-10-CM | POA: Insufficient documentation

## 2017-09-10 DIAGNOSIS — J449 Chronic obstructive pulmonary disease, unspecified: Secondary | ICD-10-CM | POA: Insufficient documentation

## 2017-09-10 DIAGNOSIS — Z79899 Other long term (current) drug therapy: Secondary | ICD-10-CM | POA: Diagnosis not present

## 2017-09-10 DIAGNOSIS — Z955 Presence of coronary angioplasty implant and graft: Secondary | ICD-10-CM | POA: Insufficient documentation

## 2017-09-10 DIAGNOSIS — K219 Gastro-esophageal reflux disease without esophagitis: Secondary | ICD-10-CM | POA: Insufficient documentation

## 2017-09-10 DIAGNOSIS — Z87891 Personal history of nicotine dependence: Secondary | ICD-10-CM | POA: Diagnosis not present

## 2017-09-10 DIAGNOSIS — G4733 Obstructive sleep apnea (adult) (pediatric): Secondary | ICD-10-CM | POA: Diagnosis not present

## 2017-09-10 DIAGNOSIS — Z951 Presence of aortocoronary bypass graft: Secondary | ICD-10-CM | POA: Insufficient documentation

## 2017-09-10 HISTORY — DX: Presence of external hearing-aid: Z97.4

## 2017-09-10 HISTORY — DX: Kissing spine, site unspecified: M48.20

## 2017-09-10 HISTORY — PX: EXCISION PARTIAL PHALANX: SHX6617

## 2017-09-10 HISTORY — DX: Presence of dental prosthetic device (complete) (partial): Z97.2

## 2017-09-10 SURGERY — EXCISION, PHALANX, PARTIAL
Anesthesia: General | Laterality: Right | Wound class: Clean

## 2017-09-10 MED ORDER — BUPIVACAINE HCL (PF) 0.5 % IJ SOLN
INTRAMUSCULAR | Status: DC | PRN
  Administered 2017-09-10: 2.5 mL

## 2017-09-10 MED ORDER — ACETAMINOPHEN 160 MG/5ML PO SOLN: 325.0000 mg | ORAL | Status: DC | PRN

## 2017-09-10 MED ORDER — MIDAZOLAM HCL 2 MG/2ML IJ SOLN
INTRAMUSCULAR | Status: DC | PRN
  Administered 2017-09-10: 1 mg via INTRAVENOUS

## 2017-09-10 MED ORDER — OXYCODONE HCL 5 MG/5ML PO SOLN: 5.0000 mg | Freq: Once | ORAL | Status: DC | PRN

## 2017-09-10 MED ORDER — POVIDONE-IODINE 7.5 % EX SOLN
Freq: Once | CUTANEOUS | Status: AC
  Administered 2017-09-10: 07:00:00 via TOPICAL

## 2017-09-10 MED ORDER — OXYCODONE HCL 5 MG PO TABS: 5.0000 mg | ORAL_TABLET | Freq: Once | ORAL | Status: DC | PRN

## 2017-09-10 MED ORDER — PROPOFOL 500 MG/50ML IV EMUL
INTRAVENOUS | Status: DC | PRN
  Administered 2017-09-10: 100 ug/kg/min via INTRAVENOUS

## 2017-09-10 MED ORDER — DEXMEDETOMIDINE HCL 200 MCG/2ML IV SOLN
INTRAVENOUS | Status: DC | PRN
  Administered 2017-09-10: 4 ug via INTRAVENOUS

## 2017-09-10 MED ORDER — HYDROCODONE-ACETAMINOPHEN 5-325 MG PO TABS: 1.0000 | ORAL_TABLET | Freq: Four times a day (QID) | ORAL | 0 refills | Status: DC | PRN

## 2017-09-10 MED ORDER — FENTANYL CITRATE (PF) 100 MCG/2ML IJ SOLN: 25.0000 ug | INTRAMUSCULAR | Status: DC | PRN

## 2017-09-10 MED ORDER — LACTATED RINGERS IV SOLN
INTRAVENOUS | Status: DC
Start: 2017-09-10 — End: 2017-09-10
  Administered 2017-09-10: 07:00:00 via INTRAVENOUS

## 2017-09-10 MED ORDER — CEFAZOLIN SODIUM-DEXTROSE 2-4 GM/100ML-% IV SOLN
2.0000 g | INTRAVENOUS | Status: AC
  Administered 2017-09-10: 2 g via INTRAVENOUS

## 2017-09-10 MED ORDER — ACETAMINOPHEN 325 MG PO TABS: 325.0000 mg | ORAL_TABLET | ORAL | Status: DC | PRN

## 2017-09-10 MED ORDER — FENTANYL CITRATE (PF) 100 MCG/2ML IJ SOLN
INTRAMUSCULAR | Status: DC | PRN
  Administered 2017-09-10 (×2): 50 ug via INTRAVENOUS

## 2017-09-10 MED ORDER — EPHEDRINE SULFATE 50 MG/ML IJ SOLN
INTRAMUSCULAR | Status: DC | PRN
  Administered 2017-09-10 (×3): 10 mg via INTRAVENOUS

## 2017-09-10 SURGICAL SUPPLY — 30 items
BANDAGE ELASTIC 4 LF NS (GAUZE/BANDAGES/DRESSINGS) ×3 IMPLANT
BENZOIN TINCTURE PRP APPL 2/3 (GAUZE/BANDAGES/DRESSINGS) ×3 IMPLANT
BLADE OSC/SAGITTAL MD 5.5X18 (BLADE) ×3 IMPLANT
BNDG ESMARK 4X12 TAN STRL LF (GAUZE/BANDAGES/DRESSINGS) ×3 IMPLANT
BNDG GAUZE 4.5X4.1 6PLY STRL (MISCELLANEOUS) ×3 IMPLANT
BNDG STRETCH 4X75 STRL LF (GAUZE/BANDAGES/DRESSINGS) ×3 IMPLANT
CANISTER SUCT 1200ML W/VALVE (MISCELLANEOUS) ×3 IMPLANT
COVER LIGHT HANDLE UNIVERSAL (MISCELLANEOUS) ×6 IMPLANT
CUFF TOURN SGL QUICK 18 (TOURNIQUET CUFF) ×3 IMPLANT
DRAPE FLUOR MINI C-ARM 54X84 (DRAPES) ×3 IMPLANT
DURAPREP 26ML APPLICATOR (WOUND CARE) ×3 IMPLANT
GAUZE PETRO XEROFOAM 1X8 (MISCELLANEOUS) ×3 IMPLANT
GAUZE SPONGE 4X4 12PLY STRL (GAUZE/BANDAGES/DRESSINGS) ×3 IMPLANT
GLOVE BIO SURGEON STRL SZ8 (GLOVE) ×3 IMPLANT
GOWN STRL REUS W/ TWL LRG LVL3 (GOWN DISPOSABLE) ×1 IMPLANT
GOWN STRL REUS W/ TWL XL LVL3 (GOWN DISPOSABLE) ×1 IMPLANT
GOWN STRL REUS W/TWL LRG LVL3 (GOWN DISPOSABLE) ×2
GOWN STRL REUS W/TWL XL LVL3 (GOWN DISPOSABLE) ×2
KIT ROOM TURNOVER OR (KITS) ×3 IMPLANT
NEEDLE HYPO 18GX1.5 BLUNT FILL (NEEDLE) ×3 IMPLANT
NEEDLE HYPO 25GX1X1/2 BEV (NEEDLE) ×3 IMPLANT
NS IRRIG 500ML POUR BTL (IV SOLUTION) ×3 IMPLANT
PACK EXTREMITY ARMC (MISCELLANEOUS) ×3 IMPLANT
PAD GROUND ADULT SPLIT (MISCELLANEOUS) ×3 IMPLANT
RASP SM TEAR CROSS CUT (RASP) ×3 IMPLANT
STOCKINETTE STRL 6IN 960660 (GAUZE/BANDAGES/DRESSINGS) ×3 IMPLANT
STRAP BODY AND KNEE 60X3 (MISCELLANEOUS) ×3 IMPLANT
SUT ETHILON 5-0 FS-2 18 BLK (SUTURE) ×3 IMPLANT
SUT VIC AB 4-0 FS2 27 (SUTURE) ×3 IMPLANT
SYRINGE 10CC LL (SYRINGE) ×3 IMPLANT

## 2017-09-10 NOTE — Op Note (Signed)
Operative note   Surgeon: Dr. Albertine Patricia, DPM.    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 Proximal section of the middle phalanx bone and also partial resection distal phalanx bone          EBL: Less than 5 cc    Anesthesia:IV sedation delivered by the anesthesia team with a local block 0.5% Marcaine plain 3 cc delivered by me preoperatively    Hemostasis: Ankle tourniquet 225 mils mercury pressure for 20 minutes    Specimen: None    Complications:none    Operative indications: Chronic pain and irritation from bone contact and friction between the second toe exostosis on the great toe    Procedure:  Patient was brought into the OR and placed on the operating table in thesupine position. After anesthesia was obtained theright lower extremity was prepped and draped in usual sterile fashion.  Operative Report: This time to his directed the second toe of the right foot dorsally over the DIP joint where a 1.5 cm linear incision was made and deepened sharp blunt dissection. Bleeders clamped and bovied as required. This point the extensor tendon overlying that area was identified and incised transversely reflected proximally off the middle phalanx head. This point a sagittal saw was used to resect the middle phalanx head. Tissues and freed away from the base of the distal phalanx medially where a prominence of bone was noted. This was resected with sagittal saw and with power rasp. The middle phalanx medially was then rasped smoothly as well. There is checked FluoroScan good position correction and reduction of bone proliferation was noted. There is an copiously irrigated and the extensor tendon was notified and reapproximated with 4 Vicryl simple interrupted sutures. Skin was reapproximated with 5-0 nylon horizontal mattress and simple combination. There is an dressed with Xeroform  gauze 4 x 4's Kling Kerlix tourniquet was released by complete vascularity seen to return all digits of the right foot.    Patient tolerated the procedure and anesthesia well.  Was transported from the OR to the PACU with all vital signs stable and vascular status intact. To be discharged per routine protocol.  Will follow up in approximately 1 week in the outpatient clinic.

## 2017-09-10 NOTE — H&P (Signed)
H and P has been reviewed and no changes are noted.  

## 2017-09-10 NOTE — Anesthesia Postprocedure Evaluation (Signed)
Anesthesia Post Note  Patient: Justin [name]  Procedure(s) Performed: EXCISION PARTIAL PHALANX-2nd toe (Right )  Patient location during evaluation: PACU Anesthesia Type: General Level of consciousness: awake and alert Pain management: pain level controlled Vital Signs Assessment: post-procedure vital signs reviewed and stable Respiratory status: spontaneous breathing, nonlabored ventilation, respiratory function stable and patient connected to nasal cannula oxygen Cardiovascular status: blood pressure returned to baseline and stable Postop Assessment: no apparent nausea or vomiting Anesthetic complications: no    DANIEL D KOVACS

## 2017-09-10 NOTE — Anesthesia Preprocedure Evaluation (Addendum)
Anesthesia Evaluation  Patient identified by MRN, date of birth, ID band Patient awake    Reviewed: Allergy & Precautions, H&P , NPO status , Patient's Chart, lab work & pertinent test results, reviewed documented beta blocker date and time   Airway Mallampati: II  TM Distance: >3 FB Neck ROM: full    Dental no notable dental hx.    Pulmonary sleep apnea , pneumonia, resolved, COPD,  COPD inhaler, former smoker,    Pulmonary exam normal breath sounds clear to auscultation       Cardiovascular + CAD, + Past MI, + Cardiac Stents and + CABG   Rhythm:regular Rate:Normal     Neuro/Psych negative neurological ROS  negative psych ROS   GI/Hepatic Neg liver ROS, GERD  Medicated,  Endo/Other  negative endocrine ROS  Renal/GU CRFRenal disease  negative genitourinary   Musculoskeletal   Abdominal   Peds  Hematology negative hematology ROS (+)   Anesthesia Other Findings   Reproductive/Obstetrics negative OB ROS                            Anesthesia Physical Anesthesia Plan  ASA: III  Anesthesia Plan: General   Post-op Pain Management:    Induction:   PONV Risk Score and Plan: 2 and Propofol infusion and Ondansetron  Airway Management Planned:   Additional Equipment:   Intra-op Plan:   Post-operative Plan:   Informed Consent: I have reviewed the patients History and Physical, chart, labs and discussed the procedure including the risks, benefits and alternatives for the proposed anesthesia with the patient or authorized representative who has indicated his/her understanding and acceptance.     Plan Discussed with: CRNA  Anesthesia Plan Comments:         Anesthesia Quick Evaluation

## 2017-09-10 NOTE — Transfer of Care (Signed)
Immediate Anesthesia Transfer of Care Note  Patient: Justin [name]  Procedure(s) Performed: EXCISION PARTIAL PHALANX-2nd toe (Right )  Patient Location: PACU  Anesthesia Type: General  Level of Consciousness: awake, alert  and patient cooperative  Airway and Oxygen Therapy: Patient Spontanous Breathing and Patient connected to supplemental oxygen  Post-op Assessment: Post-op Vital signs reviewed, Patient's Cardiovascular Status Stable, Respiratory Function Stable, Patent Airway and No signs of Nausea or vomiting  Post-op Vital Signs: Reviewed and stable  Complications: No apparent anesthesia complications

## 2017-09-10 NOTE — Anesthesia Procedure Notes (Signed)
Procedure Name: MAC Date/Time: 09/10/2017 7:31 AM Performed by: Janna Arch, CRNA Pre-anesthesia Checklist: Patient identified, Emergency Drugs available, Suction available and Patient being monitored Patient Re-evaluated:Patient Re-evaluated prior to induction Oxygen Delivery Method: Simple face mask

## 2017-09-11 ENCOUNTER — Encounter: Payer: Self-pay | Admitting: Podiatry

## 2018-02-25 ENCOUNTER — Other Ambulatory Visit: Payer: Self-pay | Admitting: Cardiology

## 2018-02-25 DIAGNOSIS — R600 Localized edema: Secondary | ICD-10-CM

## 2018-02-25 DIAGNOSIS — R0602 Shortness of breath: Secondary | ICD-10-CM

## 2018-03-01 ENCOUNTER — Ambulatory Visit
Admission: RE | Admit: 2018-03-01 | Discharge: 2018-03-01 | Disposition: A | Discharge status: Home / Self Care | Payer: Medicare Other | Source: Ambulatory Visit | Attending: Cardiology | Admitting: Cardiology

## 2018-03-01 DIAGNOSIS — R0602 Shortness of breath: Secondary | ICD-10-CM

## 2018-03-01 DIAGNOSIS — R600 Localized edema: Secondary | ICD-10-CM | POA: Diagnosis not present

## 2018-09-28 ENCOUNTER — Ambulatory Visit
Admission: RE | Admit: 2018-09-28 | Discharge: 2018-09-28 | Disposition: A | Discharge status: Home / Self Care | Payer: Medicare Other | Attending: Cardiology | Admitting: Cardiology

## 2018-09-28 ENCOUNTER — Encounter
Admission: RE | Disposition: A | Discharge status: Home / Self Care | Payer: Self-pay | Source: Home / Self Care | Attending: Cardiology

## 2018-09-28 ENCOUNTER — Other Ambulatory Visit: Payer: Self-pay

## 2018-09-28 ENCOUNTER — Encounter: Payer: Self-pay | Admitting: *Deleted

## 2018-09-28 DIAGNOSIS — N400 Enlarged prostate without lower urinary tract symptoms: Secondary | ICD-10-CM | POA: Diagnosis not present

## 2018-09-28 DIAGNOSIS — I252 Old myocardial infarction: Secondary | ICD-10-CM | POA: Insufficient documentation

## 2018-09-28 DIAGNOSIS — Z79899 Other long term (current) drug therapy: Secondary | ICD-10-CM | POA: Insufficient documentation

## 2018-09-28 DIAGNOSIS — I25119 Atherosclerotic heart disease of native coronary artery with unspecified angina pectoris: Secondary | ICD-10-CM | POA: Insufficient documentation

## 2018-09-28 DIAGNOSIS — M1990 Unspecified osteoarthritis, unspecified site: Secondary | ICD-10-CM | POA: Diagnosis not present

## 2018-09-28 DIAGNOSIS — Z87891 Personal history of nicotine dependence: Secondary | ICD-10-CM | POA: Insufficient documentation

## 2018-09-28 DIAGNOSIS — Z955 Presence of coronary angioplasty implant and graft: Secondary | ICD-10-CM | POA: Insufficient documentation

## 2018-09-28 DIAGNOSIS — Z8249 Family history of ischemic heart disease and other diseases of the circulatory system: Secondary | ICD-10-CM | POA: Diagnosis not present

## 2018-09-28 DIAGNOSIS — G4733 Obstructive sleep apnea (adult) (pediatric): Secondary | ICD-10-CM | POA: Diagnosis not present

## 2018-09-28 DIAGNOSIS — Z7951 Long term (current) use of inhaled steroids: Secondary | ICD-10-CM | POA: Insufficient documentation

## 2018-09-28 DIAGNOSIS — Z7982 Long term (current) use of aspirin: Secondary | ICD-10-CM | POA: Insufficient documentation

## 2018-09-28 DIAGNOSIS — E7801 Familial hypercholesterolemia: Secondary | ICD-10-CM | POA: Insufficient documentation

## 2018-09-28 DIAGNOSIS — K219 Gastro-esophageal reflux disease without esophagitis: Secondary | ICD-10-CM | POA: Insufficient documentation

## 2018-09-28 DIAGNOSIS — Z951 Presence of aortocoronary bypass graft: Secondary | ICD-10-CM | POA: Diagnosis not present

## 2018-09-28 DIAGNOSIS — I2510 Atherosclerotic heart disease of native coronary artery without angina pectoris: Secondary | ICD-10-CM | POA: Diagnosis not present

## 2018-09-28 DIAGNOSIS — R079 Chest pain, unspecified: Secondary | ICD-10-CM

## 2018-09-28 HISTORY — DX: Personal history of urinary calculi: Z87.442

## 2018-09-28 HISTORY — DX: Angina pectoris, unspecified: I20.9

## 2018-09-28 HISTORY — DX: Dyspnea, unspecified: R06.00

## 2018-09-28 HISTORY — PX: LEFT HEART CATH AND CORONARY ANGIOGRAPHY: CATH118249

## 2018-09-28 HISTORY — DX: Cardiac arrhythmia, unspecified: I49.9

## 2018-09-28 SURGERY — LEFT HEART CATH AND CORONARY ANGIOGRAPHY
Anesthesia: Moderate Sedation | Laterality: Left

## 2018-09-28 MED ORDER — ACETAMINOPHEN 325 MG PO TABS
650.0000 mg | ORAL_TABLET | ORAL | Status: DC | PRN
  Administered 2018-09-28: 650 mg via ORAL

## 2018-09-28 MED ORDER — FENTANYL CITRATE (PF) 100 MCG/2ML IJ SOLN
INTRAMUSCULAR | Status: AC
Start: 2018-09-28 — End: ?
  Filled 2018-09-28: qty 2

## 2018-09-28 MED ORDER — SODIUM CHLORIDE 0.9% FLUSH: 3.0000 mL | Freq: Two times a day (BID) | INTRAVENOUS | Status: DC

## 2018-09-28 MED ORDER — ACETAMINOPHEN 325 MG PO TABS
ORAL_TABLET | ORAL | Status: AC
  Filled 2018-09-28: qty 2

## 2018-09-28 MED ORDER — SODIUM CHLORIDE 0.9 % IV SOLN: 250.0000 mL | INTRAVENOUS | Status: DC | PRN

## 2018-09-28 MED ORDER — SODIUM CHLORIDE 0.9 % WEIGHT BASED INFUSION
3.0000 mL/kg/h | INTRAVENOUS | Status: AC
  Administered 2018-09-28: 3 mL/kg/h via INTRAVENOUS

## 2018-09-28 MED ORDER — ASPIRIN 81 MG PO CHEW
CHEWABLE_TABLET | ORAL | Status: AC
  Administered 2018-09-28: 81 mg via ORAL
  Filled 2018-09-28: qty 1

## 2018-09-28 MED ORDER — HEPARIN (PORCINE) IN NACL 1000-0.9 UT/500ML-% IV SOLN
INTRAVENOUS | Status: AC
  Filled 2018-09-28: qty 1000

## 2018-09-28 MED ORDER — ONDANSETRON HCL 4 MG/2ML IJ SOLN: 4.0000 mg | Freq: Four times a day (QID) | INTRAMUSCULAR | Status: DC | PRN

## 2018-09-28 MED ORDER — SODIUM CHLORIDE 0.9% FLUSH: 3.0000 mL | INTRAVENOUS | Status: DC | PRN

## 2018-09-28 MED ORDER — SODIUM CHLORIDE 0.9 % WEIGHT BASED INFUSION: 1.0000 mL/kg/h | INTRAVENOUS | Status: DC

## 2018-09-28 MED ORDER — MIDAZOLAM HCL 2 MG/2ML IJ SOLN
INTRAMUSCULAR | Status: AC
  Filled 2018-09-28: qty 2

## 2018-09-28 MED ORDER — IOPAMIDOL (ISOVUE-300) INJECTION 61%
INTRAVENOUS | Status: DC | PRN
  Administered 2018-09-28: 140 mL via INTRAVENOUS

## 2018-09-28 MED ORDER — MIDAZOLAM HCL 2 MG/2ML IJ SOLN
INTRAMUSCULAR | Status: DC | PRN
  Administered 2018-09-28: 1 mg via INTRAVENOUS

## 2018-09-28 MED ORDER — ASPIRIN 81 MG PO CHEW
81.0000 mg | CHEWABLE_TABLET | ORAL | Status: AC
  Administered 2018-09-28: 81 mg via ORAL

## 2018-09-28 SURGICAL SUPPLY — 13 items
CATH INFINITI 5 FR IM (CATHETERS) ×2 IMPLANT
CATH INFINITI 5 FR MPA2 (CATHETERS) ×2 IMPLANT
CATH INFINITI 5FR ANG PIGTAIL (CATHETERS) ×2 IMPLANT
CATH INFINITI 5FR JL4 (CATHETERS) ×2 IMPLANT
CATH INFINITI JR4 5F (CATHETERS) ×2 IMPLANT
DEVICE CLOSURE MYNXGRIP 5F (Vascular Products) ×2 IMPLANT
KIT MANI 3VAL PERCEP (MISCELLANEOUS) ×3 IMPLANT
NDL PERC 18GX7CM (NEEDLE) IMPLANT
NEEDLE PERC 18GX7CM (NEEDLE) ×3 IMPLANT
PACK CARDIAC CATH (CUSTOM PROCEDURE TRAY) ×3 IMPLANT
SHEATH AVANTI 5FR X 11CM (SHEATH) ×2 IMPLANT
WIRE EMERALD 3MM-J .035X260CM (WIRE) ×2 IMPLANT
WIRE GUIDERIGHT .035X150 (WIRE) ×2 IMPLANT

## 2018-09-28 NOTE — H&P (Signed)
Chief Complaint: Chief Complaint  Patient presents with  . Follow-up  Myo and Carotids  Date of Service: 09/21/2018 Date of Birth: 08-31-1941 PCP: Yevonne Pax, MD  History of Present Illness: Mr. Skillin is a 78 y.o.male patient with a past medical history significant for coronary artery disease s/p bypass and PCI, obstructive sleep apnea, and familial hypercholesterolemia who presents to review Myoview and bilateral carotid artery ultrasound results. Admits to continued exertional mid-sternal chest pain with occasional radiation down left arm. Episodes last a few minutes and are relieved with sublingual nitroglycerin. Associated weakness and low energy. Denies shortness of breath or palpitations. Denies syncopal/presyncopal episodes.   Stress test on 09/17/18 revealed normal heart function with small area of anterior apical hypoperfusion with stress and borderline reversibilty with rest. Bilateral carotid ultrasound on 09/02/18 revealed moderate plaque in left ICA with no evidence of hemodynamic compromise. Cardiac catheterization on 11/07/15 revealed 95% lesion of SVG graft to RCA with successful PCI with DES.   Past Medical and Surgical History  Past Medical History Past Medical History:  Diagnosis Date  . AR (allergic rhinitis)  . BPH (benign prostatic hypertrophy)  . CAD (coronary artery disease)  . Cataract cortical, senile  . Diverticulosis  . Encounter for blood transfusion  . GERD (gastroesophageal reflux disease) 1996  Started takiing prilosec now omeprazole 2 daily  . H/O emphysema  . Hyperlipidemia 06/17/2011  . Myocardial infarction (CMS-HCC) 11/03/15  . Nephrolithiasis  . OSA (obstructive sleep apnea) 06/17/2011  . Osteoarthritis  . Osteoporosis 01/21/2014  IV reclast, five compression fxs requiring kyphoplasty  . Prostate cancer (CMS-HCC) 2008  Prostatectomy  . Reflux esophagitis 01/21/2014   Past Surgical History He has a past surgical history that includes  Coronary artery bypass graft; Cholecystectomy (1997); Skin cancer removal; Cardiac catheterization; Retropubic prostatectomy; Hernia repair (1979); BACK SURGERY (2009, 2011); Fracture surgery; Prostate surgery (2008); Cataract extraction; Coronary angioplasty (11/07/15); Colonoscopy (11/27/1999); Colonoscopy (01/11/2002); Colonoscopy (07/15/2005); Colonoscopy (07/17/2008); Colonoscopy (10/31/2011); egd (02/13/2011); egd (03/24/2017); and Colonoscopy (03/27/2017).   Medications and Allergies  Current Medications  Current Outpatient Medications  Medication Sig Dispense Refill  . *niacin oral tablet 500mg  tablet 500mg  1 tab by mouth 3 times a day  . acetaminophen (TYLENOL) 325 MG tablet Take 975 mg by mouth every 8 (eight) hours  . albuterol 90 mcg/actuation inhaler Inhale 2 inhalations into the lungs every 6 (six) hours as needed for Wheezing 3 Inhaler 3  . aspirin 81 MG EC tablet Take 81 mg by mouth once daily.  Marland Kitchen atorvastatin (LIPITOR) 80 MG tablet Take 40 mg by mouth nightly.  . calcium carbonate-vitamin D3 (CALTRATE 600+D) 600 mg(1,500mg ) -400 unit tablet Take 1 tablet by mouth 2 (two) times daily with meals.  . cholecalciferol (VITAMIN D3) 1,000 unit capsule Take 1,000 Units by mouth 3 (three) times daily.  . cyanocobalamin (VITAMIN B12) 1000 MCG tablet Take 1,000 mcg by mouth once daily.  . fexofenadine (ALLEGRA) 180 MG tablet Take 180 mg by mouth once daily.  . fluticasone (FLONASE) 50 mcg/actuation nasal spray Place 1 spray into both nostrils nightly (Patient taking differently: Place 2 sprays into both nostrils nightly ) 16 g 6  . hydrocortisone (ANUSOL-HC) 25 mg suppository Place 25 mg rectally once daily as needed for Hemorrhoids.  . isosorbide mononitrate (IMDUR) 60 MG ER tablet Take 60 mg by mouth once daily.  Marland Kitchen LACTOBACILLUS ACIDOPHILUS/PECT (LACTOBACILLUS ACIDOPH-PECTIN ORAL) Take 2 capsules by mouth once daily.  . magnesium oxide (MAG-OX) 400 mg tablet Take 400 mg by  mouth 2 (two)  times daily.   . metoprolol tartrate (LOPRESSOR) 25 MG tablet Take 25 mg by mouth 2 (two) times daily  . nitroglycerin (NITROSTAT) 0.4 MG SL tablet Place 1 tablet under tongue as needed for chest pain (may repeat every 5 minutes but seek medical help if pain persists after 3 tablets) as needed  . omega-3 fatty acids 1,000 mg Cap 1 tab by mouth 2 times a day  . omeprazole (PRILOSEC) 20 MG DR capsule 2 cap by mouth daily (Patient taking differently: Take 1 capsule by mouth 2 (two) times daily. )  . potassium chloride (K-DUR,KLOR-CON) 20 MEQ ER tablet Take 1 tablet (20 mEq total) by mouth once daily 90 tablet 1  . psyllium, sugar, (METAMUCIL) 3.4 gram packet Take 1 packet by mouth 2 (two) times daily  . sennosides-docusate (SENOKOT-S) 8.6-50 mg tablet Take 2 tablets by mouth 2 (two) times daily.  Marland Kitchen tiotropium-olodaterol (STIOLTO RESPIMAT) 2.5-2.5 mcg/actuation inhaler Inhale 2 inhalations into the lungs once daily 12 g 3  . TORsemide (DEMADEX) 20 MG tablet Take 1 tablet (20 mg total) by mouth 2 (two) times daily Reported on 01/28/2016 60 tablet 6   No current facility-administered medications for this visit.   Allergies: Patient has no known allergies.  Social and Family History  Social History reports that he quit smoking about 28 years ago. His smoking use included cigarettes. He has a 70.00 pack-year smoking history. He has never used smokeless tobacco. He reports that he does not drink alcohol or use drugs.  Family History Family History  Problem Relation Age of Onset  . Osteoporosis (Thinning of bones) Mother  . Hip fracture Mother  . Prostate cancer Father  . Asthma Father  . Diabetes type II Sister  . Osteoporosis (Thinning of bones) Sister  . High blood pressure (Hypertension) Sister  . Myocardial Infarction (Heart attack) Brother  . Heart disease Brother  . Colon polyps Brother  . Coronary Artery Disease (Blocked arteries around heart) Brother  . Skin cancer Brother   Review  of Systems   Review of Systems: The patient denies chest pain, shortness of breath, orthopnea, paroxysmal nocturnal dyspnea, pedal edema, palpitations, heart racing, fatigue, dizziness, lightheadedness, presyncope, syncope, leg pain, leg cramping. Review of 12 Systems is negative except as described in HPI.   Physical Examination   Vitals:BP 108/70  Pulse 71  Ht 170.2 cm (5\' 7" )  Wt 87.3 kg (192 lb 7.4 oz)  BMI 30.14 kg/m  Ht:170.2 cm (5\' 7" ) Wt:87.3 kg (192 lb 7.4 oz) ZOX:WRUE surface area is 2.03 meters squared. Body mass index is 30.14 kg/m.  General: Well developed, well nourished. In no acute distress HEENT: Pupils equally reactive to light and accomodation  Neck: Supple without thyromegaly, or goiter. Carotid pulses 2+. No carotid bruits present.  Pulmonary: Clear to auscultation bilaterally; no wheezes, rales, rhonchi Cardiovascular: Regular rate and rhythm. No gallops, murmurs or rubs Gastrointestinal: Soft nontender, nondistended, with normal bowel sounds Extremities: No cyanosis, clubbing, or edema Peripheral Pulses: 2+ in upper extremities, 2+ in lower extremities  Neurology: Alert and oriented X3 Pysch: Good affect. Responds appropriately  Assessment   78 y.o. male with  1. Atherosclerosis of native coronary artery of native heart without angina pectoris  2. OSA (obstructive sleep apnea)  3. Familial hypercholesterolemia   Plan  1. Atherosclerosis of coronary arteries -Will proceed with cardiac catheterization for ongoing symptoms and abnormal stress test results; risks/benefits discussed in detail -Continue aspirin 81mg  daily, atorvastatin 40mg  daily, IMDUR  60mg  daily, and metoprolol 25mg  twice daily  -Continue use of sublingual nitroglycerin as needed  2. Obstructive sleep apnea -Nightly use of CPAP encouraged 3. Familial hypercholesterolemia  -Continue atorvastatin 40mg  nightly  -LDL goal <70    Orders Placed This Encounter  Procedures  . Basic Metabolic  Panel (BMP)  . CBC w/auto Differential (5 Part)  . ECG 12-lead   Return after cath.  Javier Docker Fath MD  Pt seen and examined. No change from above.

## 2018-09-28 NOTE — Discharge Instructions (Signed)
Groin Insertion Instructions-If you lose feeling or develop tingling or pain in your leg or foot after the procedure, please walk around first.  If the discomfort does not improve , contact your physician and proceed to the nearest emergency room.  Loss of feeling in your leg might mean that a blockage has formed in the artery and this can be appropriately treated.  Limit your activity for the next two days after your procedure.  Avoid stooping, bending, heavy lifting or exertion as this may put pressure on the insertion site.  Resume normal activities in 48 hours.  You may shower after 24 hours but avoid excessive warm water and do not scrub the site.  Remove clear dressing in 48 hours.  If you have had a closure device inserted, do not soak in a tub bath or a hot tub for at least one week.  No driving for 48 hours after discharge.  After the procedure, check the insertion site occasionally.  If any oozing occurs or there is apparent swelling, firm pressure over the site will prevent a bruise from forming.  You can not hurt anything by pressing directly on the site.  The pressure stops the bleeding by allowing a small clot to form.  If the bleeding continues after the pressure has been applied for more than 15 minutes, call 911 or go to the nearest emergency room.    The x-ray dye causes you to pass a considerate amount of urine.  For this reason, you will be asked to drink plenty of liquids after the procedure to prevent dehydration.  You may resume you regular diet.  Avoid caffeine products.    For pain at the site of your procedure, take non-aspirin medicines such as Tylenol. Continue taking all your present medications at home unless your doctor prescribes any changes.  Angiogram, Care After This sheet gives you information about how to care for yourself after your procedure. Your health care provider may also give you more specific instructions. If you have problems or questions, contact your health  care provider. What can I expect after the procedure? After the procedure, it is common to have bruising and tenderness at the catheter insertion area. Follow these instructions at home: Insertion site care  Follow instructions from your health care provider about how to take care of your insertion site. Make sure you: ? Wash your hands with soap and water before you change your bandage (dressing). If soap and water are not available, use hand sanitizer. ? Change your dressing as told by your health care provider. ? Leave stitches (sutures), skin glue, or adhesive strips in place. These skin closures may need to stay in place for 2 weeks or longer. If adhesive strip edges start to loosen and curl up, you may trim the loose edges. Do not remove adhesive strips completely unless your health care provider tells you to do that.  Do not take baths, swim, or use a hot tub until your health care provider approves.  You may shower 24-48 hours after the procedure or as told by your health care provider. ? Gently wash the site with plain soap and water. ? Pat the area dry with a clean towel. ? Do not rub the site. This may cause bleeding.  Do not apply powder or lotion to the site. Keep the site clean and dry.  Check your insertion site every day for signs of infection. Check for: ? Redness, swelling, or pain. ? Fluid or blood. ?  Warmth. ? Pus or a bad smell. Activity  Rest as told by your health care provider, usually for 1-2 days.  Do not lift anything that is heavier than 10 lbs. (4.5 kg) or as told by your health care provider.  Do not drive for 24 hours if you were given a medicine to help you relax (sedative).  Do not drive or use heavy machinery while taking prescription pain medicine. General instructions   Return to your normal activities as told by your health care provider, usually in about a week. Ask your health care provider what activities are safe for you.  If the  catheter site starts bleeding, lie flat and put pressure on the site. If the bleeding does not stop, get help right away. This is a medical emergency.  Drink enough fluid to keep your urine clear or pale yellow. This helps flush the contrast dye from your body.  Take over-the-counter and prescription medicines only as told by your health care provider.  Keep all follow-up visits as told by your health care provider. This is important. Contact a health care provider if:  You have a fever or chills.  You have redness, swelling, or pain around your insertion site.  You have fluid or blood coming from your insertion site.  The insertion site feels warm to the touch.  You have pus or a bad smell coming from your insertion site.  You have bruising around the insertion site.  You notice blood collecting in the tissue around the catheter site (hematoma). The hematoma may be painful to the touch. Get help right away if:  You have severe pain at the catheter insertion area.  The catheter insertion area swells very fast.  The catheter insertion area is bleeding, and the bleeding does not stop when you hold steady pressure on the area.  The area near or just beyond the catheter insertion site becomes pale, cool, tingly, or numb. These symptoms may represent a serious problem that is an emergency. Do not wait to see if the symptoms will go away. Get medical help right away. Call your local emergency services (911 in the [place] Do not drive yourself to the hospital. Summary  After the procedure, it is common to have bruising and tenderness at the catheter insertion area.  After the procedure, it is important to rest and drink plenty of fluids.  Do not take baths, swim, or use a hot tub until your health care provider says it is okay to do so. You may shower 24-48 hours after the procedure or as told by your health care provider.  If the catheter site starts bleeding, lie flat and put  pressure on the site. If the bleeding does not stop, get help right away. This is a medical emergency. This information is not intended to replace advice given to you by your health care provider. Make sure you discuss any questions you have with your health care provider. Document Released: 03/27/2005 Document Revised: 08/13/2016 Document Reviewed: 08/13/2016 Elsevier Interactive Patient Education  2019 [employer].

## 2018-10-25 ENCOUNTER — Encounter (INDEPENDENT_AMBULATORY_CARE_PROVIDER_SITE_OTHER): Payer: Medicare Other | Admitting: Vascular Surgery

## 2018-10-28 ENCOUNTER — Other Ambulatory Visit (INDEPENDENT_AMBULATORY_CARE_PROVIDER_SITE_OTHER): Payer: Self-pay | Admitting: Vascular Surgery

## 2018-10-28 ENCOUNTER — Ambulatory Visit (INDEPENDENT_AMBULATORY_CARE_PROVIDER_SITE_OTHER): Payer: Medicare Other | Admitting: Vascular Surgery

## 2018-10-28 ENCOUNTER — Other Ambulatory Visit (INDEPENDENT_AMBULATORY_CARE_PROVIDER_SITE_OTHER): Payer: Medicare Other

## 2018-10-28 ENCOUNTER — Encounter (INDEPENDENT_AMBULATORY_CARE_PROVIDER_SITE_OTHER): Payer: Self-pay | Admitting: Vascular Surgery

## 2018-10-28 ENCOUNTER — Ambulatory Visit (INDEPENDENT_AMBULATORY_CARE_PROVIDER_SITE_OTHER): Payer: Medicare Other

## 2018-10-28 VITALS — BP 129/76 | HR 71 | Resp 16 | Ht 67.0 in | Wt 194.0 lb

## 2018-10-28 DIAGNOSIS — I739 Peripheral vascular disease, unspecified: Secondary | ICD-10-CM | POA: Diagnosis not present

## 2018-10-28 DIAGNOSIS — K2100 Gastro-esophageal reflux disease with esophagitis, without bleeding: Secondary | ICD-10-CM

## 2018-10-28 DIAGNOSIS — E782 Mixed hyperlipidemia: Secondary | ICD-10-CM

## 2018-10-28 DIAGNOSIS — M79604 Pain in right leg: Secondary | ICD-10-CM | POA: Diagnosis not present

## 2018-10-28 DIAGNOSIS — I2510 Atherosclerotic heart disease of native coronary artery without angina pectoris: Secondary | ICD-10-CM

## 2018-10-28 DIAGNOSIS — M79605 Pain in left leg: Secondary | ICD-10-CM

## 2018-10-28 DIAGNOSIS — Z87891 Personal history of nicotine dependence: Secondary | ICD-10-CM

## 2018-11-01 ENCOUNTER — Encounter (INDEPENDENT_AMBULATORY_CARE_PROVIDER_SITE_OTHER): Payer: Self-pay | Admitting: Vascular Surgery

## 2018-11-01 ENCOUNTER — Ambulatory Visit (INDEPENDENT_AMBULATORY_CARE_PROVIDER_SITE_OTHER): Payer: Medicare Other | Admitting: Nurse Practitioner

## 2018-11-01 VITALS — BP 102/61 | HR 66 | Resp 14 | Ht 68.0 in | Wt 197.6 lb

## 2018-11-01 DIAGNOSIS — M5136 Other intervertebral disc degeneration, lumbar region: Secondary | ICD-10-CM

## 2018-11-01 DIAGNOSIS — M79605 Pain in left leg: Secondary | ICD-10-CM

## 2018-11-01 DIAGNOSIS — I2510 Atherosclerotic heart disease of native coronary artery without angina pectoris: Secondary | ICD-10-CM

## 2018-11-01 DIAGNOSIS — E782 Mixed hyperlipidemia: Secondary | ICD-10-CM

## 2018-11-01 DIAGNOSIS — M79604 Pain in right leg: Secondary | ICD-10-CM | POA: Diagnosis not present

## 2018-11-01 DIAGNOSIS — Z87891 Personal history of nicotine dependence: Secondary | ICD-10-CM

## 2018-11-10 ENCOUNTER — Encounter (INDEPENDENT_AMBULATORY_CARE_PROVIDER_SITE_OTHER): Payer: Self-pay | Admitting: Vascular Surgery

## 2018-11-10 DIAGNOSIS — M79606 Pain in leg, unspecified: Secondary | ICD-10-CM | POA: Insufficient documentation

## 2018-11-10 DIAGNOSIS — I739 Peripheral vascular disease, unspecified: Secondary | ICD-10-CM | POA: Insufficient documentation

## 2018-11-10 NOTE — Progress Notes (Signed)
MRN : [id number]  Justin [name] is a 78 y.o. (19-Dec-1940) male who presents with chief complaint of  Chief Complaint  Patient presents with  . Establish Care  .  History of Present Illness:  The patient is seen for evaluation of painful lower extremities. Patient notes the pain is variable and not always associated with activity.  The pain is somewhat consistent day to day occurring on most days. The patient notes the pain also occurs with standing and routinely seems worse as the day wears on. The pain has been progressive over the past several years. The patient states these symptoms are causing  a profound negative impact on quality of life and daily activities.  The patient denies rest pain or dangling of an extremity off the side of the bed during the night for relief. No open wounds or sores at this time. No history of DVT or phlebitis. No prior interventions or surgeries.  There is a  history of back problems and DJD of the lumbar and sacral spine.    Current Meds  Medication Sig  . acetaminophen (TYLENOL) 325 MG tablet Take 975 mg by mouth 3 (three) times daily.   Marland Kitchen albuterol (PROVENTIL HFA;VENTOLIN HFA) 108 (90 Base) MCG/ACT inhaler Inhale 2 puffs into the lungs every 4 (four) hours as needed for wheezing or shortness of breath.  Marland Kitchen aspirin EC 81 MG tablet Take 81 mg by mouth daily.  Marland Kitchen atorvastatin (LIPITOR) 80 MG tablet Take 40 mg by mouth at bedtime.   . Calcium Carb-Cholecalciferol (CALCIUM 600 + D PO) Take 1 tablet by mouth 2 (two) times daily.   . cholecalciferol (VITAMIN D) 1000 units tablet Take 1,000 Units by mouth 3 (three) times daily.  . cyanocobalamin 1000 MCG tablet Take 1,000 mcg by mouth daily.  Marland Kitchen docusate sodium (COLACE) 50 MG capsule Take 100 mg by mouth 2 (two) times daily.  . fexofenadine (ALLEGRA) 180 MG tablet Take 180 mg by mouth daily before breakfast.   . fluticasone (FLONASE) 50 MCG/ACT nasal spray Place 2 sprays into both nostrils at bedtime.  .  gabapentin (NEURONTIN) 100 MG capsule Take 300 mg by mouth at bedtime.   . hydrocortisone (ANUSOL-HC) 25 MG suppository Place 25 mg rectally 2 (two) times daily as needed for hemorrhoids or anal itching.  . isosorbide mononitrate (IMDUR) 60 MG 24 hr tablet Take 60 mg by mouth daily.  Marland Kitchen LACTOBACILLUS PO Take 2 tablets by mouth every morning.   . Magnesium 400 MG CAPS Take 400 mg by mouth 2 (two) times daily.   . metoprolol tartrate (LOPRESSOR) 25 MG tablet Take 25 mg by mouth 2 (two) times daily.   . niacin 500 MG tablet Take 500 mg by mouth 3 (three) times daily. Take one tablet in the morning, take one tablet in the afternoon and take one tablet at night  . nitroGLYCERIN (NITROSTAT) 0.4 MG SL tablet Place 0.4 mg under the tongue every 5 (five) minutes x 3 doses as needed for chest pain. If no relief call MD or go to emergency room.  . Olodaterol HCl 2.5 MCG/ACT AERS Inhale 2 puffs into the lungs every morning.  . Omega-3 Fatty Acids (FISH OIL) 1000 MG CAPS Take 1,000 mg by mouth 2 (two) times daily.  Marland Kitchen omeprazole (PRILOSEC) 20 MG capsule Take 20 mg by mouth 2 (two) times daily before a meal.  . potassium chloride SA (K-DUR,KLOR-CON) 20 MEQ tablet Take 20 mEq by mouth at bedtime.  . psyllium (  METAMUCIL) 58.6 % packet Take 1 packet by mouth 2 (two) times daily.   Marland Kitchen senna (SENOKOT) 8.6 MG TABS tablet Take 1 tablet by mouth daily as needed for mild constipation.  . Tiotropium Bromide-Olodaterol 2.5-2.5 MCG/ACT AERS Inhale 2 puffs into the lungs daily.  Marland Kitchen torsemide (DEMADEX) 20 MG tablet Take 20 mg by mouth 2 (two) times daily.   . traMADol (ULTRAM) 50 MG tablet Take 50 mg by mouth 2 (two) times daily.     Past Medical History:  Diagnosis Date  . Anginal pain ([place])   . Arthritis   . 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  . CAD (coronary artery disease)   . Chronic kidney disease   . COPD (chronic obstructive pulmonary disease) ([place])   . DJD (degenerative joint disease)   . Does use hearing aid      bilateral  . Dyspnea   . Dysrhythmia   . GERD (gastroesophageal reflux disease)   . History of kidney stones   . History of open heart surgery   . Myocardial infarction ([place])   . Skin cancer   . Sleep apnea   . Wears dentures    partial upper and lower    Past Surgical History:  Procedure Laterality Date  . BACK SURGERY    . CARDIAC CATHETERIZATION N/A 11/07/2015   Procedure: Left Heart Cath and Coronary Angiography;  Surgeon: Teodoro Spray, MD;  Location: [facility];  Service: Cardiovascular;  Laterality: N/A;  . CARDIAC CATHETERIZATION N/A 11/07/2015   Procedure: Coronary Stent Intervention;  Surgeon: Isaias Cowman, MD;  Location: [facility];  Service: Cardiovascular;  Laterality: N/A;  . CATARACT EXTRACTION    . CHOLECYSTECTOMY    . COLONOSCOPY WITH PROPOFOL N/A 03/27/2017   Procedure: COLONOSCOPY WITH PROPOFOL;  Surgeon: Manya Silvas, MD;  Location: [facility] ENDOSCOPY;  Service: Endoscopy;  Laterality: N/A;  . CORONARY ANGIOPLASTY    . CORONARY ARTERY BYPASS GRAFT    . ESOPHAGOGASTRODUODENOSCOPY (EGD) WITH PROPOFOL N/A 03/27/2017   Procedure: ESOPHAGOGASTRODUODENOSCOPY (EGD) WITH PROPOFOL;  Surgeon: Manya Silvas, MD;  Location: [facility] ENDOSCOPY;  Service: Endoscopy;  Laterality: N/A;  . EXCISION PARTIAL PHALANX Right 09/10/2017   Procedure: EXCISION PARTIAL PHALANX-2nd toe;  Surgeon: Albertine Patricia, DPM;  Location: [place];  Service: Podiatry;  Laterality: Right;  sleep apnea  . EYE SURGERY    . HERNIA REMOVED    . HERNIA REPAIR    . kissing spine syndrome    . kyphoplasty t10 t11    . LEFT HEART CATH AND CORONARY ANGIOGRAPHY Left 09/28/2018   Procedure: LEFT HEART CATH AND CORONARY ANGIOGRAPHY;  Surgeon: Teodoro Spray, MD;  Location: [place] [facility];  Service: Cardiovascular;  Laterality: Left;  . OPEN HEART SURGERY    . RETROPUBIC PROSTATECTOMY    . SKIN CANCER REMOVED      Social History Social History   Tobacco Use   . Smoking status: Former Smoker    Years: 35.00    Last attempt to quit: 06/26/1990    Years since quitting: 28.3  . Smokeless tobacco: Never Used  Substance Use Topics  . Alcohol use: No    Alcohol/week: 0.0 standard drinks  . Drug use: No    Family History Family History  Problem Relation Age of Onset  . Cancer Father   No family history of bleeding/clotting disorders, porphyria or autoimmune disease   No Known Allergies   REVIEW OF SYSTEMS (Negative unless checked)  Constitutional: [] Weight loss  [] Fever  [] Chills Cardiac: [x] Chest pain   [] Chest pressure   [] Palpitations   [] Shortness of breath when laying flat   [] Shortness of breath with exertion. Vascular:  [x] Pain in legs with walking   [x] Pain in legs at rest  [] History of DVT   [] Phlebitis   [] Swelling in legs   [] Varicose veins   [] Non-healing ulcers Pulmonary:   [] Uses home oxygen   [] Productive cough   [] Hemoptysis   [] Wheeze  [] COPD   [] Asthma Neurologic:  [] Dizziness   [] Seizures   [] History of stroke   [] History of TIA  [] Aphasia   [] Vissual changes   [] Weakness or numbness in arm   [] Weakness or numbness in leg Musculoskeletal:   [] Joint swelling   [] Joint pain   [] Low back pain Hematologic:  [] Easy bruising  [] Easy bleeding   [] Hypercoagulable state   [] Anemic Gastrointestinal:  [] Diarrhea   [] Vomiting  [x] Gastroesophageal reflux/heartburn   [] Difficulty swallowing. Genitourinary:  [] Chronic kidney disease   [] Difficult urination  [] Frequent urination   [] Blood in urine Skin:  [] Rashes   [] Ulcers  Psychological:  [] History of anxiety   []  History of major depression.  Physical Examination  Vitals:   10/28/18 0859  BP: 129/76  Pulse: 71  Resp: 16  Weight: 194 lb (88 kg)  Height: 5\' 7"  (1.702 m)   Body mass index is 30.38 kg/m. Gen: WD/WN, NAD Head: [place]/AT, No temporalis wasting.  Ear/Nose/Throat: Hearing grossly intact, nares w/o erythema or drainage, poor dentition Eyes: PER, EOMI, sclera  nonicteric.  Neck: Supple, no masses.  No bruit or JVD.  Pulmonary:  Good air movement, clear to auscultation bilaterally, no use of accessory muscles.  Cardiac: RRR, normal S1, S2, no Murmurs. Vascular:  Both feet pink warm with good cap refill Vessel Right Left  Radial Palpable Palpable  PT 1+ Palpable 1+ Palpable  DP Trace Palpable Trace Palpable  Gastrointestinal: soft, non-distended. No guarding/no peritoneal signs.  Musculoskeletal: M/S 5/5 throughout.  No deformity or atrophy.  Neurologic: CN 2-12 intact. Pain and light touch intact in extremities.  Symmetrical.  Speech is fluent. Motor exam as listed above. Psychiatric: Judgment intact, Mood & affect appropriate for pt's clinical situation. Dermatologic: No rashes or ulcers noted.  No changes consistent with cellulitis. Lymph : No Cervical lymphadenopathy, no lichenification or skin changes of chronic lymphedema.  CBC Lab Results  Component Value Date   WBC 7.3 11/08/2015   HGB 12.9 (L) 11/08/2015   HCT 38.1 (L) 11/08/2015   MCV 100.1 (H) 11/08/2015   PLT 141 (L) 11/08/2015    BMET    Component Value Date/Time   NA 139 11/08/2015 0513   K 3.7 11/08/2015 0513   CL 105 11/08/2015 0513   CO2 30 11/08/2015 0513   GLUCOSE 117 (H) 11/08/2015 0513   BUN 11 11/08/2015 0513   CREATININE 1.00 06/11/2017 1147   CALCIUM 8.5 (L) 11/08/2015 0513   GFRNONAA >60 11/08/2015 0513   GFRAA >60 11/08/2015 0513   CrCl cannot be calculated (Patient's most recent lab result is older than the maximum 21 days allowed.).  COAG Lab Results  Component Value Date   INR 1.24 11/03/2015    Radiology Vas [place] Burnard Bunting With/wo Tbi  Result Date: 10/28/2018 LOWER EXTREMITY DOPPLER STUDY Indications: Claudication.  Performing Technologist: Charlane Ferretti RT (R)(VS)  Examination Guidelines: A complete evaluation includes at minimum, Doppler waveform signals and systolic blood pressure reading at the level of bilateral brachial, anterior tibial, and  posterior tibial arteries,  when vessel segments are accessible. Bilateral testing is considered an integral part of a complete examination. Photoelectric Plethysmograph (PPG) waveforms and toe systolic pressure readings are included as required and additional duplex testing as needed. Limited examinations for reoccurring indications may be performed as noted.  ABI Findings: +---------+------------------+-----+--------+----------------+ Right    Rt Pressure (mmHg)IndexWaveformComment          +---------+------------------+-----+--------+----------------+ Brachial 108                                             +---------+------------------+-----+--------+----------------+ ATA      135               1.22                          +---------+------------------+-----+--------+----------------+ PTA                                     Non compressable +---------+------------------+-----+--------+----------------+ Great Toe120               1.08                          +---------+------------------+-----+--------+----------------+ +---------+------------------+-----+--------+----------------+ Left     Lt Pressure (mmHg)IndexWaveformComment          +---------+------------------+-----+--------+----------------+ Brachial 111                                             +---------+------------------+-----+--------+----------------+ ATA                                     Non compressable +---------+------------------+-----+--------+----------------+ PTA      174               1.57                          +---------+------------------+-----+--------+----------------+ Great Toe132               1.19                          +---------+------------------+-----+--------+----------------+ +-------+-----------+-----------+------------+------------+ ABI/TBIToday's ABIToday's TBIPrevious ABIPrevious TBI +-------+-----------+-----------+------------+------------+ Right   1.22       1.08                                +-------+-----------+-----------+------------+------------+ Left   1.57       1.19                                +-------+-----------+-----------+------------+------------+  Summary: Right: Resting right ankle-brachial index is within normal range. No evidence of significant right lower extremity arterial disease. The right toe-brachial index is normal. Right posterior tibial artery is non compressable suggestive of medial calcification. Left: Resting left ankle-brachial index indicates noncompressible left lower extremity arteries.The left toe-brachial index is normal. ABIs are unreliable. Left anterior tibial artery is non compressable and suggestve of  medial calcification.  *See table(s) above for measurements and observations.  Electronically signed by Hortencia Pilar MD on 10/28/2018 at 2:33:05 PM.   Final    Vas [place] Lower Extremity Arterial Duplex  Result Date: 10/28/2018 LOWER EXTREMITY ARTERIAL DUPLEX STUDY Indications: Claudication.  Current ABI: Right ABI = 1.22 Left ABI = 1.57 Performing Technologist: Charlane Ferretti RT (R)(VS)  Examination Guidelines: A complete evaluation includes B-mode imaging, spectral Doppler, color Doppler, and power Doppler as needed of all accessible portions of each vessel. Bilateral testing is considered an integral part of a complete examination. Limited examinations for reoccurring indications may be performed as noted.  Right Duplex Findings: +----------+--------+-----+--------+---------+--------+           PSV cm/sRatioStenosisWaveform Comments +----------+--------+-----+--------+---------+--------+ CFA Prox  94                   triphasic         +----------+--------+-----+--------+---------+--------+ DFA       72                   triphasic         +----------+--------+-----+--------+---------+--------+ SFA Prox  65                   triphasic          +----------+--------+-----+--------+---------+--------+ SFA Mid   59                   triphasic         +----------+--------+-----+--------+---------+--------+ SFA Distal64                   triphasic         +----------+--------+-----+--------+---------+--------+ POP Prox  43                   triphasic         +----------+--------+-----+--------+---------+--------+ POP Distal36                   triphasic         +----------+--------+-----+--------+---------+--------+ ATA Distal28                   triphasic         +----------+--------+-----+--------+---------+--------+ PTA Distal75                   triphasic         +----------+--------+-----+--------+---------+--------+  +--------------+-------+-----------+--------+--------+-----+--------+ Left PoplitealAP (cm)Transv (cm)WaveformStenosisShapeComments +--------------+-------+-----------+--------+--------+-----+--------+ Distal                                                        +--------------+-------+-----------+--------+--------+-----+--------+  Summary: Right: Near normal examination. ABIs are unreliable.  See table(s) above for measurements and observations. Electronically signed by Hortencia Pilar MD on 10/28/2018 at 2:33:08 PM.    Final       Assessment/Plan 1. PAD (peripheral artery disease) (HCC)  Recommend:  The patient has atypical pain symptoms for pure atherosclerotic disease. However, on physical exam there is evidence of mixed venous and arterial disease, given the diminished pulses of the legs.  Noninvasive studies including ABI's and right arterial ultrasound of the legs will be obtained and the patient will follow up with me to review these studies.  The patient should continue walking and begin a more formal exercise program. The patient should continue his  antiplatelet therapy and aggressive treatment of the lipid abnormalities.  The patient should begin wearing  graduated compression socks 15-20 mmHg strength to control edema.   - VAS [place] ABI WITH/WO TBI; Future - VAS [place] LOWER EXTREMITY ARTERIAL DUPLEX; Future  2. Pain in both lower extremities Recommend:  The patient has atypical pain symptoms for pure atherosclerotic disease. However, on physical exam there is evidence of mixed venous and arterial disease, given the diminished pulses of the legs.  Noninvasive studies including ABI's and right arterial ultrasound of the legs will be obtained and the patient will follow up with me to review these studies.  The patient should continue walking and begin a more formal exercise program. The patient should continue his antiplatelet therapy and aggressive treatment of the lipid abnormalities.  The patient should begin wearing graduated compression socks 15-20 mmHg strength to control edema.   - VAS [place] ABI WITH/WO TBI; Future - VAS [place] LOWER EXTREMITY ARTERIAL DUPLEX; Future  3. Arteriosclerosis of coronary artery Continue cardiac and antihypertensive medications as already ordered and reviewed, no changes at this time.  Continue statin as ordered and reviewed, no changes at this time  Nitrates PRN for chest pain   4. Esophagitis, reflux Continue PPI as already ordered, this medication has been reviewed and there are no changes at this time.  Avoidence of caffeine and alcohol  Moderate elevation of the head of the bed   5. Mixed hyperlipidemia Continue statin as ordered and reviewed, no changes at this time     Hortencia Pilar, MD  11/10/2018 7:54 AM

## 2018-11-14 ENCOUNTER — Encounter (INDEPENDENT_AMBULATORY_CARE_PROVIDER_SITE_OTHER): Payer: Self-pay | Admitting: Nurse Practitioner

## 2018-11-14 NOTE — Progress Notes (Signed)
SUBJECTIVE:  Patient ID: Justin [name], male    DOB: 1941/01/31, 78 y.o.   MRN: [id number] Chief Complaint  Patient presents with  . Follow-up    HPI  Justin [name] is a 78 y.o. male that presents today for follow-up studies to evaluate bilateral leg pain.  The patient describes claudication with ambulation.  He also describes pain when sitting.  He states that sometimes the pain is worse with ambulation sometimes it is present at rest.  He denies increasing intensity of pain when he lays flat.  He denies any fever, chills, nausea, vomiting or diarrhea.  He denies any chest pain or shortness of breath.  He denies any TIA-like symptoms or amaurosis fugax.  The patient has several PAD risk factors such as high cholesterol and existing coronary artery disease.  The patient underwent bilateral ABIs which were noncompressible.  However he had triphasic waveforms in his bilateral tibial arteries.  He also under went an arterial duplex of his right lower extremity which was triphasic from the level of the external iliac to the tibial arteries.  Past Medical History:  Diagnosis Date  . Anginal pain ([place])   . Arthritis   . 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  . CAD (coronary artery disease)   . Chronic kidney disease   . COPD (chronic obstructive pulmonary disease) ([place])   . DJD (degenerative joint disease)   . Does use hearing aid    bilateral  . Dyspnea   . Dysrhythmia   . GERD (gastroesophageal reflux disease)   . History of kidney stones   . History of open heart surgery   . Myocardial infarction ([place])   . Skin cancer   . Sleep apnea   . Wears dentures    partial upper and lower    Past Surgical History:  Procedure Laterality Date  . BACK SURGERY    . CARDIAC CATHETERIZATION N/A 11/07/2015   Procedure: Left Heart Cath and Coronary Angiography;  Surgeon: Teodoro Spray, MD;  Location: [facility];  Service: Cardiovascular;  Laterality: N/A;  . CARDIAC CATHETERIZATION  N/A 11/07/2015   Procedure: Coronary Stent Intervention;  Surgeon: Isaias Cowman, MD;  Location: [facility];  Service: Cardiovascular;  Laterality: N/A;  . CATARACT EXTRACTION    . CHOLECYSTECTOMY    . COLONOSCOPY WITH PROPOFOL N/A 03/27/2017   Procedure: COLONOSCOPY WITH PROPOFOL;  Surgeon: Manya Silvas, MD;  Location: [facility] ENDOSCOPY;  Service: Endoscopy;  Laterality: N/A;  . CORONARY ANGIOPLASTY    . CORONARY ARTERY BYPASS GRAFT    . ESOPHAGOGASTRODUODENOSCOPY (EGD) WITH PROPOFOL N/A 03/27/2017   Procedure: ESOPHAGOGASTRODUODENOSCOPY (EGD) WITH PROPOFOL;  Surgeon: Manya Silvas, MD;  Location[facility] ENDOSCOPY;  Service: Endoscopy;  Laterality: N/A;  . EXCISION PARTIAL PHALANX Right 09/10/2017   Procedure: EXCISION PARTIAL PHALANX-2nd toe;  Surgeon: Albertine Patricia, DPM;  Location: [facility];  Service: Podiatry;  Laterality: Right;  sleep apnea  . EYE SURGERY    . HERNIA REMOVED    . HERNIA REPAIR    . kissing spine syndrome    . kyphoplasty t10 t11    . LEFT HEART CATH AND CORONARY ANGIOGRAPHY Left 09/28/2018   Procedure: LEFT HEART CATH AND CORONARY ANGIOGRAPHY;  Surgeon: Teodoro Spray, MD;  Location: [facility];  Service: Cardiovascular;  Laterality: Left;  . OPEN HEART SURGERY    . RETROPUBIC PROSTATECTOMY    . SKIN CANCER REMOVED      Social  History   Socioeconomic History  . Marital status: Married    Spouse name: Not on file  . Number of children: Not on file  . Years of education: Not on file  . Highest education level: Not on file  Occupational History  . Not on file  Social Needs  . Financial resource strain: Not on file  . Food insecurity:    Worry: Not on file    Inability: Not on file  . Transportation needs:    Medical: Not on file    Non-medical: Not on file  Tobacco Use  . Smoking status: Former Smoker    Years: 35.00    Last attempt to quit: 06/26/1990    Years since quitting: 28.4  . Smokeless tobacco: Never Used   Substance and Sexual Activity  . Alcohol use: No    Alcohol/week: 0.0 standard drinks  . Drug use: No  . Sexual activity: Not on file  Lifestyle  . Physical activity:    Days per week: Not on file    Minutes per session: Not on file  . Stress: Not on file  Relationships  . Social connections:    Talks on phone: Not on file    Gets together: Not on file    Attends religious service: Not on file    Active member of club or organization: Not on file    Attends meetings of clubs or organizations: Not on file    Relationship status: Not on file  . Intimate partner violence:    Fear of current or ex partner: Not on file    Emotionally abused: Not on file    Physically abused: Not on file    Forced sexual activity: Not on file  Other Topics Concern  . Not on file  Social History Narrative  . Not on file    Family History  Problem Relation Age of Onset  . Cancer Father     No Known Allergies   Review of Systems   Review of Systems: Negative Unless Checked Constitutional: [] Weight loss  [] Fever  [] Chills Cardiac: [] Chest pain   []  Atrial Fibrillation  [] Palpitations   [] Shortness of breath when laying flat   [] Shortness of breath with exertion. [] Shortness of breath at rest Vascular:  [x] Pain in legs with walking   [] Pain in legs with standing [x] Pain in legs when laying flat   [x] Claudication    [] Pain in feet when laying flat    [] History of DVT   [] Phlebitis   [] Swelling in legs   [] Varicose veins   [] Non-healing ulcers Pulmonary:   [] Uses home oxygen   [] Productive cough   [] Hemoptysis   [] Wheeze  [] COPD   [] Asthma Neurologic:  [] Dizziness   [] Seizures  [] Blackouts [] History of stroke   [] History of TIA  [] Aphasia   [] Temporary Blindness   [] Weakness or numbness in arm   [] Weakness or numbness in leg Musculoskeletal:   [] Joint swelling   [] Joint pain   [x] Low back pain  []  History of Knee Replacement [] Arthritis [] back Surgeries  [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 [] Abdominal pain Genitourinary:  [] Chronic kidney disease   [] Difficult urination  [] Anuric   [] Blood in urine [] Frequent urination  [] Burning with urination   [] Hematuria Skin:  [] Rashes   [] Ulcers [] Wounds Psychological:  [] History of anxiety   []  History  of major depression  []  Memory Difficulties      OBJECTIVE:   Physical Exam  BP 102/61 (BP Location: Right Arm, Patient Position: Sitting)   Pulse 66   Resp 14   Ht 5\' 8"  (1.727 m)   Wt 197 lb 9.6 oz (89.6 kg)   BMI 30.04 kg/m   Gen: WD/WN, NAD Head: [place]/AT, No temporalis wasting.  Ear/Nose/Throat: Hearing grossly intact, nares w/o erythema or drainage Eyes: PER, EOMI, sclera nonicteric.  Neck: Supple, no masses.  No JVD.  Pulmonary:  Good air movement, no use of accessory muscles.  Cardiac: RRR Vascular:  Vessel Right Left  Radial Palpable Palpable  Dorsalis Pedis Palpable Palpable  Posterior Tibial Palpable Palpable   Gastrointestinal: soft, non-distended. No guarding/no peritoneal signs.  Musculoskeletal: M/S 5/5 throughout.  No deformity or atrophy.  Neurologic: Pain and light touch intact in extremities.  Symmetrical.  Speech is fluent. Motor exam as listed above. Psychiatric: Judgment intact, Mood & affect appropriate for pt's clinical situation. Dermatologic: No Venous rashes. No Ulcers Noted.  No changes consistent with cellulitis. Lymph : No Cervical lymphadenopathy, no lichenification or skin changes of chronic lymphedema.       ASSESSMENT AND PLAN:  1. Pain in both lower extremities Recommend:  I do not find evidence of Vascular pathology that would explain the patient's symptoms  The patient has atypical pain symptoms for vascular disease  Noninvasive studies including venous ultrasound of the legs do not identify vascular  problems  The patient should continue walking and begin a more formal exercise program. The patient should continue his antiplatelet therapy and aggressive treatment of the lipid abnormalities. The patient should begin wearing graduated compression socks 15-20 mmHg strength to control her mild edema.  Patient will follow-up with me on a PRN basis  Further work-up of her lower extremity pain is deferred to the primary service     2. Mixed hyperlipidemia Continue statin as ordered and reviewed, no changes at this time   3. DDD (degenerative disc disease), lumbar Continue NSAID medications as already ordered, these medications have been reviewed and there are no changes at this time.  Continued activity and therapy was stressed.    Current Outpatient Medications on File Prior to Visit  Medication Sig Dispense Refill  . acetaminophen (TYLENOL) 325 MG tablet Take 975 mg by mouth 3 (three) times daily.     Marland Kitchen albuterol (PROVENTIL HFA;VENTOLIN HFA) 108 (90 Base) MCG/ACT inhaler Inhale 2 puffs into the lungs every 4 (four) hours as needed for wheezing or shortness of breath.    Marland Kitchen aspirin EC 81 MG tablet Take 81 mg by mouth daily.    Marland Kitchen atorvastatin (LIPITOR) 80 MG tablet Take 40 mg by mouth at bedtime.     . Calcium Carb-Cholecalciferol (CALCIUM 600 + D PO) Take 1 tablet by mouth 2 (two) times daily.     . cholecalciferol (VITAMIN D) 1000 units tablet Take 1,000 Units by mouth 3 (three) times daily.    . cyanocobalamin 1000 MCG tablet Take 1,000 mcg by mouth daily.    Marland Kitchen docusate sodium (COLACE) 50 MG capsule Take 100 mg by mouth 2 (two) times daily.    . fexofenadine (ALLEGRA) 180 MG tablet Take 180 mg by mouth daily before breakfast.     . fluticasone (FLONASE) 50 MCG/ACT nasal spray Place 2 sprays into both nostrils at bedtime.    . gabapentin (NEURONTIN) 100 MG capsule Take 300 mg by mouth at bedtime.     Marland Kitchen  hydrocortisone (ANUSOL-HC) 25 MG suppository Place 25 mg rectally 2 (two) times  daily as needed for hemorrhoids or anal itching.    . isosorbide mononitrate (IMDUR) 60 MG 24 hr tablet Take 60 mg by mouth daily.    Marland Kitchen LACTOBACILLUS PO Take 2 tablets by mouth every morning.     . Magnesium 400 MG CAPS Take 400 mg by mouth 2 (two) times daily.     . metoprolol tartrate (LOPRESSOR) 25 MG tablet Take 25 mg by mouth 2 (two) times daily.     . niacin 500 MG tablet Take 500 mg by mouth 3 (three) times daily. Take one tablet in the morning, take one tablet in the afternoon and take one tablet at night    . nitroGLYCERIN (NITROSTAT) 0.4 MG SL tablet Place 0.4 mg under the tongue every 5 (five) minutes x 3 doses as needed for chest pain. If no relief call MD or go to emergency room.    . Olodaterol HCl 2.5 MCG/ACT AERS Inhale 2 puffs into the lungs every morning.    . Omega-3 Fatty Acids (FISH OIL) 1000 MG CAPS Take 1,000 mg by mouth 2 (two) times daily.    Marland Kitchen omeprazole (PRILOSEC) 20 MG capsule Take 20 mg by mouth 2 (two) times daily before a meal.    . potassium chloride SA (K-DUR,KLOR-CON) 20 MEQ tablet Take 20 mEq by mouth at bedtime.    . psyllium (METAMUCIL) 58.6 % packet Take 1 packet by mouth 2 (two) times daily.     Marland Kitchen senna (SENOKOT) 8.6 MG TABS tablet Take 1 tablet by mouth daily as needed for mild constipation.    . Tiotropium Bromide-Olodaterol 2.5-2.5 MCG/ACT AERS Inhale 2 puffs into the lungs daily.    Marland Kitchen torsemide (DEMADEX) 20 MG tablet Take 20 mg by mouth 2 (two) times daily.     . traMADol (ULTRAM) 50 MG tablet Take 50 mg by mouth 2 (two) times daily.      No current facility-administered medications on file prior to visit.     There are no Patient Instructions on file for this visit. No follow-ups on file.   Kris Hartmann, NP  This note was completed with Sales executive.  Any errors are purely unintentional.

## 2019-08-04 DIAGNOSIS — C4492 Squamous cell carcinoma of skin, unspecified: Secondary | ICD-10-CM

## 2019-08-04 HISTORY — DX: Squamous cell carcinoma of skin, unspecified: C44.92

## 2020-03-15 ENCOUNTER — Other Ambulatory Visit: Payer: Self-pay | Admitting: Family Medicine

## 2020-03-15 DIAGNOSIS — S32010A Wedge compression fracture of first lumbar vertebra, initial encounter for closed fracture: Secondary | ICD-10-CM

## 2020-03-19 ENCOUNTER — Ambulatory Visit
Admission: RE | Admit: 2020-03-19 | Discharge: 2020-03-19 | Disposition: A | Discharge status: Home / Self Care | Payer: Medicare Other | Source: Ambulatory Visit | Attending: Family Medicine | Admitting: Family Medicine

## 2020-03-19 ENCOUNTER — Other Ambulatory Visit: Payer: Self-pay

## 2020-03-19 DIAGNOSIS — S32010A Wedge compression fracture of first lumbar vertebra, initial encounter for closed fracture: Secondary | ICD-10-CM

## 2020-03-21 ENCOUNTER — Other Ambulatory Visit
Admission: RE | Admit: 2020-03-21 | Discharge: 2020-03-21 | Disposition: A | Discharge status: Home / Self Care | Payer: Medicare Other | Source: Ambulatory Visit | Attending: Orthopedic Surgery | Admitting: Orthopedic Surgery

## 2020-03-21 ENCOUNTER — Other Ambulatory Visit: Payer: Self-pay

## 2020-03-21 ENCOUNTER — Other Ambulatory Visit: Payer: Self-pay | Admitting: Orthopedic Surgery

## 2020-03-21 DIAGNOSIS — Z20822 Contact with and (suspected) exposure to covid-19: Secondary | ICD-10-CM | POA: Insufficient documentation

## 2020-03-21 DIAGNOSIS — Z01812 Encounter for preprocedural laboratory examination: Secondary | ICD-10-CM | POA: Insufficient documentation

## 2020-03-21 LAB — SARS CORONAVIRUS 2 (TAT 6-24 HRS): SARS Coronavirus 2: NEGATIVE

## 2020-03-21 NOTE — H&P (Signed)
Chief Complaint  Patient presents with  . Establish Care  L1 Compression fracture   History of the Present Illness: Justin [name] is a 79 y.o. male here for evaluation of L1 compression fracture. An MRI was ordered by Grayland Ormond, MD, in internal medicine. He comes in today to discuss possible kyphoplasty.  The patient states he is in such pain from his back that he does not want to delay surgery. He can not lay down due to pain in his back. He sleeps in a chair at the edge of his bed. He walks with very slow steps.  The patient has had prior kyphoplasties by Dr. Mauri Pole. In 2009, he had T7, T8, and T9 kyphoplasties. In 2011, he had T10 and T11 kyphoplasties.  The patient takes baby aspirin. He states he is constipated from his medication. He is not short of breath.  I have reviewed past medical, surgical, social and family history, and allergies as documented in the EMR.  Past Medical History: Past Medical History:  Diagnosis Date  . AR (allergic rhinitis)  . BPH (benign prostatic hypertrophy)  . CAD (coronary artery disease)  . Cataract cortical, senile  . Chronic obstructive pulmonary disease (CMS-HCC) 06/06/2019  . Diverticulosis  . Encounter for blood transfusion  . Essential hypertension 06/06/2019  . GERD (gastroesophageal reflux disease) 1996  Started takiing prilosec now omeprazole 2 daily  . H/O emphysema  . Hyperlipidemia 06/17/2011  . Myocardial infarction (CMS-HCC) 11/03/15  . Nephrolithiasis  . OSA (obstructive sleep apnea) 06/17/2011  . Osteoarthritis  . Osteoporosis 01/21/2014  IV reclast, five compression fxs requiring kyphoplasty  . Prostate cancer (CMS-HCC) 2008  Prostatectomy  . Reflux esophagitis 01/21/2014  . Skin cancer  neck   Past Surgical History: Past Surgical History:  Procedure Laterality Date  . BACK SURGERY 2009, 2011  . CARDIAC CATHETERIZATION  . CATARACT EXTRACTION  . CHOLECYSTECTOMY 1997  . COLONOSCOPY 11/27/1999  Tubulovillous Polyp  .  COLONOSCOPY 01/11/2002  PH Adenomatous Polyp  . COLONOSCOPY 07/15/2005  Serrated Adenoma  . COLONOSCOPY 07/17/2008  PH Adenomatous Polyps  . COLONOSCOPY 10/31/2011  Adenomatous Polyp: CBF 10/2016  . COLONOSCOPY 03/27/2017  Adenomatous Polyps: CBF 03/2020  . CORONARY ANGIOPLASTY 11/07/15  . CORONARY ARTERY BYPASS GRAFT  . EGD 02/13/2011  No repeat per MUS  . EGD 03/24/2017  No repeat per RTE  . FRACTURE SURGERY  compression fractures see back surgery  . HERNIA REPAIR 1979  . PROSTATE SURGERY 2008  . RETROPUBIC PROSTATECTOMY  . Skin cancer removal  . SKIN CANCER REMOVED Right 08/01/2019  Right side of neck   Past Family History: Family History  Problem Relation Age of Onset  . Osteoporosis (Thinning of bones) Mother  . Hip fracture Mother  . Prostate cancer Father  . Asthma Father  . Cancer Father  . Diabetes type II Sister  . Osteoporosis (Thinning of bones) Sister  . High blood pressure (Hypertension) Sister  . Myocardial Infarction (Heart attack) Brother  . Heart disease Brother  . Colon polyps Brother  . Coronary Artery Disease (Blocked arteries around heart) Brother  . Skin cancer Brother  . High blood pressure (Hypertension) Sister   Medications: Current Outpatient Medications Ordered in Epic  Medication Sig Dispense Refill  . *niacin oral tablet 500mg  tablet 500mg  1 tab by mouth 3 times a day  . acetaminophen (TYLENOL) 650 MG ER tablet Take 650 mg by mouth every 8 (eight) hours as needed for Pain  . albuterol 90 mcg/actuation inhaler Inhale 2 inhalations  into the lungs every 6 (six) hours as needed for Wheezing 3 Inhaler 3  . ascorbic acid/multivit-min (EMERGEN-C ORAL) Take by mouth One packet daily  . aspirin 81 MG EC tablet Take 81 mg by mouth once daily.  Marland Kitchen atorvastatin (LIPITOR) 80 MG tablet Take 40 mg by mouth nightly.  . BENEFIBER, WHEAT DEXTRIN, ORAL Take by mouth  . calcium carbonate-vitamin D3 (CALTRATE 600+D) 600 mg(1,500mg ) -400 unit tablet Take 1  tablet by mouth 2 (two) times daily with meals.  . cholecalciferol (VITAMIN D3) 1,000 unit capsule Take 3,000 Units by mouth once daily  . cyanocobalamin (VITAMIN B12) 1000 MCG tablet Take 1,000 mcg by mouth once daily.  . fexofenadine (ALLEGRA) 180 MG tablet Take 180 mg by mouth once daily.  . fluticasone (FLONASE) 50 mcg/actuation nasal spray Place 1 spray into both nostrils nightly (Patient taking differently: Place 2 sprays into both nostrils nightly ) 16 g 6  . gabapentin (NEURONTIN) 300 MG capsule Take 300 mg by mouth once daily  . hydrocortisone (ANUSOL-HC) 25 mg suppository Place 25 mg rectally once daily as needed for Hemorrhoids.  Marland Kitchen ibuprofen (MOTRIN) 400 MG tablet Take 400 mg by mouth 3 (three) times daily  . isosorbide mononitrate (IMDUR) 60 MG ER tablet Take 60 mg by mouth once daily.  Marland Kitchen LACTOBACILLUS ACIDOPHILUS/PECT (LACTOBACILLUS ACIDOPH-PECTIN ORAL) Take 2 capsules by mouth once daily.  . magnesium oxide (MAG-OX) 400 mg tablet Take 400 mg by mouth 2 (two) times daily.   . metoprolol tartrate (LOPRESSOR) 25 MG tablet Take 25 mg by mouth 2 (two) times daily  . nitroglycerin (NITROSTAT) 0.4 MG SL tablet Place 1 tablet under tongue as needed for chest pain (may repeat every 5 minutes but seek medical help if pain persists after 3 tablets) as needed  . omega-3 fatty acids 1,000 mg Cap 1 tab by mouth 2 times a day  . omeprazole (PRILOSEC) 20 MG DR capsule 2 cap by mouth daily (Patient taking differently: Take 1 capsule by mouth 2 (two) times daily. )  . ondansetron (ZOFRAN) 4 MG tablet Take 1 tablet (4 mg total) by mouth every 8 (eight) hours as needed for Nausea for up to 7 days 20 tablet 0  . oxyCODONE (ROXICODONE) 5 MG immediate release tablet Take 1 tablet (5 mg total) by mouth every 8 (eight) hours as needed for Pain for up to 5 days 15 tablet 0  . polyethylene glycol (MIRALAX) packet Take 17 g by mouth once daily Mix in 4-8ounces of fluid prior to taking.  . potassium chloride  (KLOR-CON) 20 MEQ ER tablet Take 20 mEq by mouth 2 (two) times daily  . sennosides-docusate (SENOKOT-S) 8.6-50 mg tablet Take 2 tablets by mouth 2 (two) times daily.  . sodium phosphates (FLEET) enema Place 1 enema rectally once  . sour cherry extract (TART CHERRY EXTRACT) 1,000 mg Cap Take 3,000 mg by mouth once daily  . tiotropium-olodateroL (STIOLTO RESPIMAT) 2.5-2.5 mcg/actuation inhaler Inhale 2 inhalations into the lungs once daily 12 g 3  . TORsemide (DEMADEX) 20 MG tablet Take 1 tablet (20 mg total) by mouth 3 (three) times daily Reported on 01/28/2016 (Patient taking differently: Take 20 mg by mouth 3 (three) times daily Reported on 01/28/2016 takes additional 1 tablet For 5 days as needed ) 90 tablet 6  . acetaminophen (TYLENOL) 325 MG tablet Take 650 mg by mouth once daily (Patient not taking: Reported on 03/14/2020 )   No current Epic-ordered facility-administered medications on file.   Allergies: No  Known Allergies   Body mass index is 32.02 kg/m.  Review of Systems: A comprehensive 14 point ROS was performed, reviewed, and the pertinent orthopaedic findings are documented in the HPI.  Vitals:  03/21/20 0916  BP: 134/80   General Physical Examination:  General/Constitutional: No apparent distress: well-nourished and well developed. Eyes: Pupils equal, round with synchronous movement. Lungs: Clear to auscultation HEENT: Normal Vascular: No edema, swelling or tenderness, except as noted in detailed exam. Cardiac: Heart rate and rhythm is regular. Integumentary: No impressive skin lesions present, except as noted in detailed exam. Neuro/Psych: Normal mood and affect, oriented to person, place and time.  Musculoskeletal Examination: On exam, tenderness to L1. Lungs are clear. Heart rate and rhythm is normal. HEENT is normal. Partial upper and lower dentures.  Radiographs: No new imaging studies were obtained or reviewed today.  Assessment: ICD-10-CM  1. Closed wedge  compression fracture of L1 vertebra, initial encounter (CMS-HCC) S32.010A   Plan: The patient has clinical findings of very painful L1 compression fracture.  We discussed the patient 's MRI findings. We will plan for L1 kyphoplasty on Friday after cardiology evaluation. I reviewed the procedure and postoperative course with the patient, as well as provided a brochure.   Surgical Risks:  The nature of the condition and the proposed procedure has been reviewed in detail with the patient. Surgical versus non-surgical options and prognosis for recovery have been reviewed and the inherent risks and benefits of each have been discussed including the risks of infection, bleeding, injury to nerves/blood vessels/tendons, incomplete relief of symptoms, persisting pain and/or stiffness, loss of function, complex regional pain syndrome, failure of the procedure, as appropriate.  Teeth: Partial upper and lower dentures  Scribe Attestation: I, Dawn Royse, am acting as scribe for [employer], MD Reviewed paper H+P, will be scanned into chart. No changes noted.

## 2020-03-22 ENCOUNTER — Other Ambulatory Visit: Payer: Self-pay | Admitting: Cardiology

## 2020-03-22 ENCOUNTER — Other Ambulatory Visit
Admission: RE | Admit: 2020-03-22 | Discharge: 2020-03-22 | Disposition: A | Discharge status: Home / Self Care | Payer: Medicare Other | Source: Ambulatory Visit | Attending: Rehabilitative and Restorative Service Providers" | Admitting: Rehabilitative and Restorative Service Providers"

## 2020-03-22 ENCOUNTER — Ambulatory Visit
Admission: RE | Admit: 2020-03-22 | Discharge: 2020-03-22 | Disposition: A | Discharge status: Home / Self Care | Payer: Medicare Other | Source: Ambulatory Visit | Attending: Rehabilitative and Restorative Service Providers" | Admitting: Rehabilitative and Restorative Service Providers"

## 2020-03-22 DIAGNOSIS — I2510 Atherosclerotic heart disease of native coronary artery without angina pectoris: Secondary | ICD-10-CM | POA: Insufficient documentation

## 2020-03-22 DIAGNOSIS — R0602 Shortness of breath: Secondary | ICD-10-CM | POA: Insufficient documentation

## 2020-03-22 LAB — BRAIN NATRIURETIC PEPTIDE: B Natriuretic Peptide: 157.9 pg/mL — ABNORMAL HIGH (ref 0.0–100.0)

## 2020-03-22 MED ORDER — POTASSIUM CHLORIDE CRYS ER 20 MEQ PO TBCR
EXTENDED_RELEASE_TABLET | ORAL | Status: AC
  Administered 2020-03-22: 40 meq via ORAL
  Filled 2020-03-22: qty 2

## 2020-03-22 MED ORDER — FUROSEMIDE 10 MG/ML IJ SOLN
INTRAMUSCULAR | Status: AC
  Administered 2020-03-22: 80 mg via INTRAVENOUS
  Filled 2020-03-22: qty 4

## 2020-03-22 MED ORDER — POTASSIUM CHLORIDE ER 10 MEQ PO TBCR: 40.0000 meq | EXTENDED_RELEASE_TABLET | Freq: Every day | ORAL | Status: DC

## 2020-03-22 MED ORDER — FUROSEMIDE 10 MG/ML IJ SOLN: 80.0000 mg | Freq: Once | INTRAMUSCULAR | Status: DC

## 2020-03-22 MED ORDER — POTASSIUM CHLORIDE CRYS ER 20 MEQ PO TBCR: 40.0000 meq | EXTENDED_RELEASE_TABLET | Freq: Once | ORAL | Status: AC

## 2020-03-22 MED ORDER — FUROSEMIDE 10 MG/ML IJ SOLN
INTRAMUSCULAR | Status: AC
  Filled 2020-03-22: qty 4

## 2020-03-22 MED ORDER — FUROSEMIDE 10 MG/ML IJ SOLN: 80.0000 mg | Freq: Once | INTRAMUSCULAR | Status: AC

## 2020-03-23 ENCOUNTER — Other Ambulatory Visit: Payer: Self-pay

## 2020-03-23 ENCOUNTER — Ambulatory Visit
Admission: RE | Admit: 2020-03-23 | Discharge: 2020-03-23 | Disposition: A | Discharge status: Home / Self Care | Payer: Medicare Other | Attending: Orthopedic Surgery | Admitting: Orthopedic Surgery

## 2020-03-23 ENCOUNTER — Ambulatory Visit: Payer: Medicare Other | Admitting: Anesthesiology

## 2020-03-23 ENCOUNTER — Encounter: Payer: Self-pay | Admitting: Orthopedic Surgery

## 2020-03-23 ENCOUNTER — Encounter
Admission: RE | Disposition: A | Discharge status: Home / Self Care | Payer: Self-pay | Source: Home / Self Care | Attending: Orthopedic Surgery

## 2020-03-23 ENCOUNTER — Ambulatory Visit: Payer: Medicare Other

## 2020-03-23 DIAGNOSIS — M810 Age-related osteoporosis without current pathological fracture: Secondary | ICD-10-CM | POA: Diagnosis not present

## 2020-03-23 DIAGNOSIS — I491 Atrial premature depolarization: Secondary | ICD-10-CM | POA: Diagnosis not present

## 2020-03-23 DIAGNOSIS — Z85828 Personal history of other malignant neoplasm of skin: Secondary | ICD-10-CM | POA: Diagnosis not present

## 2020-03-23 DIAGNOSIS — Z9079 Acquired absence of other genital organ(s): Secondary | ICD-10-CM | POA: Insufficient documentation

## 2020-03-23 DIAGNOSIS — E785 Hyperlipidemia, unspecified: Secondary | ICD-10-CM | POA: Diagnosis not present

## 2020-03-23 DIAGNOSIS — Z951 Presence of aortocoronary bypass graft: Secondary | ICD-10-CM | POA: Diagnosis not present

## 2020-03-23 DIAGNOSIS — I10 Essential (primary) hypertension: Secondary | ICD-10-CM | POA: Diagnosis not present

## 2020-03-23 DIAGNOSIS — J439 Emphysema, unspecified: Secondary | ICD-10-CM | POA: Insufficient documentation

## 2020-03-23 DIAGNOSIS — Z9849 Cataract extraction status, unspecified eye: Secondary | ICD-10-CM | POA: Diagnosis not present

## 2020-03-23 DIAGNOSIS — N400 Enlarged prostate without lower urinary tract symptoms: Secondary | ICD-10-CM | POA: Diagnosis not present

## 2020-03-23 DIAGNOSIS — K219 Gastro-esophageal reflux disease without esophagitis: Secondary | ICD-10-CM | POA: Insufficient documentation

## 2020-03-23 DIAGNOSIS — S32010A Wedge compression fracture of first lumbar vertebra, initial encounter for closed fracture: Secondary | ICD-10-CM | POA: Diagnosis present

## 2020-03-23 DIAGNOSIS — Z8042 Family history of malignant neoplasm of prostate: Secondary | ICD-10-CM | POA: Insufficient documentation

## 2020-03-23 DIAGNOSIS — Z79899 Other long term (current) drug therapy: Secondary | ICD-10-CM | POA: Diagnosis not present

## 2020-03-23 DIAGNOSIS — Z9861 Coronary angioplasty status: Secondary | ICD-10-CM | POA: Diagnosis not present

## 2020-03-23 DIAGNOSIS — K5900 Constipation, unspecified: Secondary | ICD-10-CM | POA: Insufficient documentation

## 2020-03-23 DIAGNOSIS — Z8546 Personal history of malignant neoplasm of prostate: Secondary | ICD-10-CM | POA: Insufficient documentation

## 2020-03-23 DIAGNOSIS — Z808 Family history of malignant neoplasm of other organs or systems: Secondary | ICD-10-CM | POA: Insufficient documentation

## 2020-03-23 DIAGNOSIS — Y939 Activity, unspecified: Secondary | ICD-10-CM | POA: Insufficient documentation

## 2020-03-23 DIAGNOSIS — Z8371 Family history of colonic polyps: Secondary | ICD-10-CM | POA: Insufficient documentation

## 2020-03-23 DIAGNOSIS — Z825 Family history of asthma and other chronic lower respiratory diseases: Secondary | ICD-10-CM | POA: Insufficient documentation

## 2020-03-23 DIAGNOSIS — Z9049 Acquired absence of other specified parts of digestive tract: Secondary | ICD-10-CM | POA: Insufficient documentation

## 2020-03-23 DIAGNOSIS — Z7982 Long term (current) use of aspirin: Secondary | ICD-10-CM | POA: Diagnosis not present

## 2020-03-23 DIAGNOSIS — G4733 Obstructive sleep apnea (adult) (pediatric): Secondary | ICD-10-CM | POA: Insufficient documentation

## 2020-03-23 DIAGNOSIS — X58XXXA Exposure to other specified factors, initial encounter: Secondary | ICD-10-CM | POA: Diagnosis not present

## 2020-03-23 DIAGNOSIS — Z8249 Family history of ischemic heart disease and other diseases of the circulatory system: Secondary | ICD-10-CM | POA: Insufficient documentation

## 2020-03-23 DIAGNOSIS — Z833 Family history of diabetes mellitus: Secondary | ICD-10-CM | POA: Insufficient documentation

## 2020-03-23 DIAGNOSIS — I252 Old myocardial infarction: Secondary | ICD-10-CM | POA: Insufficient documentation

## 2020-03-23 DIAGNOSIS — Z8262 Family history of osteoporosis: Secondary | ICD-10-CM | POA: Insufficient documentation

## 2020-03-23 DIAGNOSIS — M1990 Unspecified osteoarthritis, unspecified site: Secondary | ICD-10-CM | POA: Insufficient documentation

## 2020-03-23 DIAGNOSIS — I2510 Atherosclerotic heart disease of native coronary artery without angina pectoris: Secondary | ICD-10-CM | POA: Insufficient documentation

## 2020-03-23 DIAGNOSIS — Z419 Encounter for procedure for purposes other than remedying health state, unspecified: Secondary | ICD-10-CM

## 2020-03-23 HISTORY — PX: KYPHOPLASTY: SHX5884

## 2020-03-23 LAB — BASIC METABOLIC PANEL
Anion gap: 14 (ref 5–15)
BUN: 24 mg/dL — ABNORMAL HIGH (ref 8–23)
CO2: 26 mmol/L (ref 22–32)
Calcium: 9 mg/dL (ref 8.9–10.3)
Chloride: 98 mmol/L (ref 98–111)
Creatinine, Ser: 1.41 mg/dL — ABNORMAL HIGH (ref 0.61–1.24)
GFR calc Af Amer: 55 mL/min — ABNORMAL LOW (ref >60)
GFR calc non Af Amer: 47 mL/min — ABNORMAL LOW (ref >60)
Glucose, Bld: 96 mg/dL (ref 70–99)
Potassium: 4.2 mmol/L (ref 3.5–5.1)
Sodium: 138 mmol/L (ref 135–145)

## 2020-03-23 LAB — CBC
HCT: 38.9 % — ABNORMAL LOW (ref 39.0–52.0)
Hemoglobin: 13 g/dL (ref 13.0–17.0)
MCH: 32.3 pg (ref 26.0–34.0)
MCHC: 33.4 g/dL (ref 30.0–36.0)
MCV: 96.8 fL (ref 80.0–100.0)
Platelets: 184 10*3/uL (ref 150–400)
RBC: 4.02 MIL/uL — ABNORMAL LOW (ref 4.22–5.81)
RDW: 13.1 % (ref 11.5–15.5)
WBC: 7 10*3/uL (ref 4.0–10.5)
nRBC: 0 % (ref 0.0–0.2)

## 2020-03-23 SURGERY — KYPHOPLASTY
Anesthesia: General | Site: Spine Lumbar

## 2020-03-23 MED ORDER — SEVOFLURANE IN SOLN
RESPIRATORY_TRACT | Status: AC
  Filled 2020-03-23: qty 250

## 2020-03-23 MED ORDER — FENTANYL CITRATE (PF) 100 MCG/2ML IJ SOLN: 25.0000 ug | INTRAMUSCULAR | Status: DC | PRN

## 2020-03-23 MED ORDER — BUPIVACAINE-EPINEPHRINE (PF) 0.5% -1:200000 IJ SOLN
INTRAMUSCULAR | Status: DC | PRN
  Administered 2020-03-23: 20 mL

## 2020-03-23 MED ORDER — OXYCODONE HCL 5 MG PO TABS
5.0000 mg | ORAL_TABLET | Freq: Once | ORAL | Status: AC | PRN
  Administered 2020-03-23: 5 mg via ORAL

## 2020-03-23 MED ORDER — OXYCODONE HCL 5 MG/5ML PO SOLN: 5.0000 mg | Freq: Once | ORAL | Status: AC | PRN

## 2020-03-23 MED ORDER — METOCLOPRAMIDE HCL 10 MG PO TABS: 5.0000 mg | ORAL_TABLET | Freq: Three times a day (TID) | ORAL | Status: DC | PRN

## 2020-03-23 MED ORDER — CEFAZOLIN SODIUM-DEXTROSE 2-4 GM/100ML-% IV SOLN
INTRAVENOUS | Status: AC
  Filled 2020-03-23: qty 100

## 2020-03-23 MED ORDER — CHLORHEXIDINE GLUCONATE 0.12 % MT SOLN: 15.0000 mL | Freq: Once | OROMUCOSAL | Status: AC

## 2020-03-23 MED ORDER — DEXAMETHASONE SODIUM PHOSPHATE 10 MG/ML IJ SOLN
INTRAMUSCULAR | Status: AC
  Filled 2020-03-23: qty 1

## 2020-03-23 MED ORDER — OXYCODONE HCL 5 MG PO TABS
ORAL_TABLET | ORAL | Status: AC
  Filled 2020-03-23: qty 1

## 2020-03-23 MED ORDER — SODIUM CHLORIDE 0.9 % IV SOLN: INTRAVENOUS | Status: DC

## 2020-03-23 MED ORDER — METOCLOPRAMIDE HCL 5 MG/ML IJ SOLN: 5.0000 mg | Freq: Three times a day (TID) | INTRAMUSCULAR | Status: DC | PRN

## 2020-03-23 MED ORDER — FENTANYL CITRATE (PF) 100 MCG/2ML IJ SOLN
INTRAMUSCULAR | Status: AC
  Filled 2020-03-23: qty 2

## 2020-03-23 MED ORDER — ONDANSETRON HCL 4 MG PO TABS: 4.0000 mg | ORAL_TABLET | Freq: Four times a day (QID) | ORAL | Status: DC | PRN

## 2020-03-23 MED ORDER — BUPIVACAINE-EPINEPHRINE (PF) 0.5% -1:200000 IJ SOLN
INTRAMUSCULAR | Status: AC
  Filled 2020-03-23: qty 30

## 2020-03-23 MED ORDER — CEFAZOLIN SODIUM-DEXTROSE 2-4 GM/100ML-% IV SOLN
2.0000 g | INTRAVENOUS | Status: AC
  Administered 2020-03-23: 2 g via INTRAVENOUS

## 2020-03-23 MED ORDER — LIDOCAINE HCL (PF) 1 % IJ SOLN
INTRAMUSCULAR | Status: AC
  Filled 2020-03-23: qty 30

## 2020-03-23 MED ORDER — LACTATED RINGERS IV SOLN: INTRAVENOUS | Status: DC

## 2020-03-23 MED ORDER — DEXAMETHASONE SODIUM PHOSPHATE 10 MG/ML IJ SOLN
INTRAMUSCULAR | Status: DC | PRN
  Administered 2020-03-23: 5 mg via INTRAVENOUS

## 2020-03-23 MED ORDER — ONDANSETRON HCL 4 MG/2ML IJ SOLN
INTRAMUSCULAR | Status: AC
  Filled 2020-03-23: qty 2

## 2020-03-23 MED ORDER — PROPOFOL 500 MG/50ML IV EMUL
INTRAVENOUS | Status: DC | PRN
  Administered 2020-03-23: 80 ug/kg/min via INTRAVENOUS

## 2020-03-23 MED ORDER — ORAL CARE MOUTH RINSE: 15.0000 mL | Freq: Once | OROMUCOSAL | Status: AC

## 2020-03-23 MED ORDER — ONDANSETRON HCL 4 MG/2ML IJ SOLN
INTRAMUSCULAR | Status: DC | PRN
  Administered 2020-03-23: 4 mg via INTRAVENOUS

## 2020-03-23 MED ORDER — LIDOCAINE HCL 1 % IJ SOLN
INTRAMUSCULAR | Status: DC | PRN
  Administered 2020-03-23: 10 mL
  Administered 2020-03-23: 20 mL

## 2020-03-23 MED ORDER — FENTANYL CITRATE (PF) 100 MCG/2ML IJ SOLN
INTRAMUSCULAR | Status: DC | PRN
  Administered 2020-03-23 (×2): 25 ug via INTRAVENOUS
  Administered 2020-03-23: 50 ug via INTRAVENOUS

## 2020-03-23 MED ORDER — PROPOFOL 500 MG/50ML IV EMUL
INTRAVENOUS | Status: AC
  Filled 2020-03-23: qty 50

## 2020-03-23 MED ORDER — CHLORHEXIDINE GLUCONATE 0.12 % MT SOLN
OROMUCOSAL | Status: AC
  Administered 2020-03-23: 15 mL via OROMUCOSAL
  Filled 2020-03-23: qty 15

## 2020-03-23 MED ORDER — ONDANSETRON HCL 4 MG/2ML IJ SOLN: 4.0000 mg | Freq: Four times a day (QID) | INTRAMUSCULAR | Status: DC | PRN

## 2020-03-23 SURGICAL SUPPLY — 20 items
CEMENT KYPHON CX01A KIT/MIXER (Cement) ×3 IMPLANT
COVER WAND RF STERILE (DRAPES) ×3 IMPLANT
DERMABOND ADVANCED (GAUZE/BANDAGES/DRESSINGS) ×2
DERMABOND ADVANCED .7 DNX12 (GAUZE/BANDAGES/DRESSINGS) ×1 IMPLANT
DEVICE BIOPSY BONE KYPHX (INSTRUMENTS) ×3 IMPLANT
DRAPE C-ARM XRAY 36X54 (DRAPES) ×3 IMPLANT
DURAPREP 26ML APPLICATOR (WOUND CARE) ×3 IMPLANT
GLOVE SURG SYN 9.0  PF PI (GLOVE) ×2
GLOVE SURG SYN 9.0 PF PI (GLOVE) ×1 IMPLANT
GOWN SRG 2XL LVL 4 RGLN SLV (GOWNS) ×1 IMPLANT
GOWN STRL NON-REIN 2XL LVL4 (GOWNS) ×2
GOWN STRL REUS W/ TWL LRG LVL3 (GOWN DISPOSABLE) ×1 IMPLANT
GOWN STRL REUS W/TWL LRG LVL3 (GOWN DISPOSABLE) ×2
PACK KYPHOPLASTY (MISCELLANEOUS) ×3 IMPLANT
RENTAL RFA  GENERATOR (MISCELLANEOUS)
RENTAL RFA GENERATOR (MISCELLANEOUS) IMPLANT
STRAP SAFETY 5IN WIDE (MISCELLANEOUS) ×3 IMPLANT
SWABSTK COMLB BENZOIN TINCTURE (MISCELLANEOUS) ×3 IMPLANT
TRAY KYPHOPAK 15/3 EXPRESS 1ST (MISCELLANEOUS) IMPLANT
TRAY KYPHOPAK 20/3 EXPRESS 1ST (MISCELLANEOUS) ×3 IMPLANT

## 2020-03-23 NOTE — Anesthesia Preprocedure Evaluation (Addendum)
Anesthesia Evaluation  Patient identified by MRN, date of birth, ID band Patient awake    Reviewed: Allergy & Precautions, H&P , NPO status , Patient's Chart, lab work & pertinent test results  Airway Mallampati: II  TM Distance: >3 FB     Dental  (+) Chipped, Missing   Pulmonary shortness of breath, sleep apnea , COPD, former smoker,    breath sounds clear to auscultation       Cardiovascular (-) angina+ CAD, + Past MI and + Peripheral Vascular Disease   Rhythm:regular Rate:Normal     Neuro/Psych negative neurological ROS  negative psych ROS   GI/Hepatic Neg liver ROS, GERD  Controlled,  Endo/Other  negative endocrine ROS  Renal/GU Renal stones  negative genitourinary   Musculoskeletal   Abdominal   Peds  Hematology negative hematology ROS (+)   Anesthesia Other Findings Past Medical History: No date: Anginal pain (HCC) No date: Arthritis 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 No date: CAD (coronary artery disease) No date: Chronic kidney disease No date: COPD (chronic obstructive pulmonary disease) (HCC) No date: DJD (degenerative joint disease) No date: Does use hearing aid     Comment:  bilateral No date: Dyspnea No date: Dysrhythmia No date: GERD (gastroesophageal reflux disease) No date: History of kidney stones No date: History of open heart surgery No date: Myocardial infarction ([place]) No date: Skin cancer No date: Sleep apnea No date: Wears dentures     Comment:  partial upper and lower  Past Surgical History: No date: BACK SURGERY 11/07/2015: CARDIAC CATHETERIZATION; N/A     Comment:  Procedure: Left Heart Cath and Coronary Angiography;                Surgeon: Teodoro Spray, MD;  Location: [facility] [facility]               [facility];  Service: Cardiovascular;  Laterality: N/A; 11/07/2015: CARDIAC CATHETERIZATION; N/A     Comment:  Procedure: Coronary Stent Intervention;  Surgeon:                Isaias Cowman, MD;  Location: [facility] [facility];              Service: Cardiovascular;  Laterality: N/A; No date: CATARACT EXTRACTION No date: CHOLECYSTECTOMY 03/27/2017: COLONOSCOPY WITH PROPOFOL; N/A     Comment:  Procedure: COLONOSCOPY WITH PROPOFOL;  Surgeon: Manya Silvas, MD;  Location: [facility] ENDOSCOPY;  Service:               Endoscopy;  Laterality: N/A; No date: CORONARY ANGIOPLASTY No date: CORONARY ARTERY BYPASS GRAFT 03/27/2017: ESOPHAGOGASTRODUODENOSCOPY (EGD) WITH PROPOFOL; N/A     Comment:  Procedure: ESOPHAGOGASTRODUODENOSCOPY (EGD) WITH               PROPOFOL;  Surgeon: Manya Silvas, MD;  Location:               [facility] ENDOSCOPY;  Service: Endoscopy;  Laterality: N/A; 09/10/2017: EXCISION PARTIAL PHALANX; Right     Comment:  Procedure: EXCISION PARTIAL PHALANX-2nd toe;  Surgeon:               Albertine Patricia, DPM;  Location: [place];                Service: Podiatry;  Laterality: Right;  sleep apnea No date: EYE SURGERY No date: HERNIA REMOVED No date: HERNIA  REPAIR 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 No date: kyphoplasty t10 t11 09/28/2018: LEFT HEART CATH AND CORONARY ANGIOGRAPHY; Left     Comment:  Procedure: LEFT HEART CATH AND CORONARY ANGIOGRAPHY;                Surgeon: Teodoro Spray, MD;  Location: [place] [facility]              [facility];  Service: Cardiovascular;  Laterality: Left; No date: OPEN HEART SURGERY No date: RETROPUBIC PROSTATECTOMY No date: SKIN CANCER REMOVED     Reproductive/Obstetrics negative OB ROS                            Anesthesia Physical Anesthesia Plan  ASA: III  Anesthesia Plan: General   Post-op Pain Management:    Induction:   PONV Risk Score and Plan: Propofol infusion and TIVA  Airway Management Planned: Natural Airway and Nasal Cannula  Additional Equipment:   Intra-op Plan:   Post-operative Plan:   Informed Consent: I have reviewed the patients  History and Physical, chart, labs and discussed the procedure including the risks, benefits and alternatives for the proposed anesthesia with the patient or authorized representative who has indicated his/her understanding and acceptance.     Dental Advisory Given  Plan Discussed with: Anesthesiologist, CRNA and Surgeon  Anesthesia Plan Comments:        Anesthesia Quick Evaluation

## 2020-03-23 NOTE — Transfer of Care (Signed)
Immediate Anesthesia Transfer of Care Note  Patient: Justin [name]  Procedure(s) Performed: KYPHOPLASTY L1 (N/A Spine Lumbar)  Patient Location: PACU  Anesthesia Type:General  Level of Consciousness: drowsy  Airway & Oxygen Therapy: Patient Spontanous Breathing and Patient connected to nasal cannula oxygen  Post-op Assessment: Report given to RN  Post vital signs: stable  Last Vitals:  Vitals Value Taken Time  BP 140/114 03/23/20 1255  Temp    Pulse 79 03/23/20 1255  Resp 12 03/23/20 1255  SpO2 97 % 03/23/20 1255  Vitals shown include unvalidated device data.  Last Pain:  Vitals:   03/23/20 1058  TempSrc: Temporal  PainSc: 7          Complications: No complications documented.

## 2020-03-23 NOTE — Discharge Instructions (Addendum)
AMBULATORY SURGERY  DISCHARGE INSTRUCTIONS   1) The drugs that you were given will stay in your system until tomorrow so for the next 24 hours you should not:  A) Drive an automobile B) Make any legal decisions C) Drink any alcoholic beverage   2) You may resume regular meals tomorrow.  Today it is better to start with liquids and gradually work up to solid foods.  You may eat anything you prefer, but it is better to start with liquids, then soup and crackers, and gradually work up to solid foods.   3) Please notify your doctor immediately if you have any unusual bleeding, trouble breathing, redness and pain at the surgery site, drainage, fever, or pain not relieved by medication. 4)   5) Your post-operative visit with Dr.                                     is: Date:                        Time:    Please call to schedule your post-operative visit.  6) Additional Instructions:      Take it easy today and tomorrow with no lifting or bending.  Try to walk all you can starting today. Call office if you are having problems.  [place] on Sunday and then okay to shower.

## 2020-03-23 NOTE — Anesthesia Postprocedure Evaluation (Signed)
Anesthesia Post Note  Patient: Justin [name]  Procedure(s) Performed: KYPHOPLASTY L1 (N/A Spine Lumbar)  Patient location during evaluation: PACU Anesthesia Type: General Level of consciousness: awake and alert Pain management: pain level controlled Vital Signs Assessment: post-procedure vital signs reviewed and stable Respiratory status: spontaneous breathing, nonlabored ventilation and respiratory function stable Cardiovascular status: blood pressure returned to baseline and stable Postop Assessment: no apparent nausea or vomiting Anesthetic complications: no   No complications documented.   Last Vitals:  Vitals:   03/23/20 1330 03/23/20 1354  BP: 139/84 129/88  Pulse: 79 76  Resp:  18  Temp: (!) 36.1 C (!) 36.1 C  SpO2: 99% 95%    Last Pain:  Vitals:   03/23/20 1354  TempSrc: Temporal  PainSc: [place]

## 2020-03-23 NOTE — Op Note (Signed)
Date March 23, 2020  time 12:50 PM   PATIENT:  Justin [name]   PRE-OPERATIVE DIAGNOSIS:  closed wedge compression fracture of L1   POST-OPERATIVE DIAGNOSIS:  closed wedge compression fracture of L1   PROCEDURE:  Procedure(s): KYPHOPLASTY L1  SURGEON: Laurene Footman, MD   ASSISTANTS: None   ANESTHESIA:   local and MAC   EBL:  No intake/output data recorded.   BLOOD ADMINISTERED:none   DRAINS: none    LOCAL MEDICATIONS USED:  MARCAINE    and XYLOCAINE    SPECIMEN:   None   DISPOSITION OF SPECIMEN:  None   COUNTS:  YES   TOURNIQUET:  * No tourniquets in log *   IMPLANTS: Bone cement   DICTATION: .Dragon Dictation  patient was brought to the operating room and after adequate anesthesia was obtained the patient was placed prone.  C arm was brought in in good visualization of the affected level obtained on both AP and lateral projections.  After patient identification and timeout procedures were completed, local anesthetic was infiltrated with 10 cc 1% Xylocaine infiltrated subcutaneously.  This is done the area on the each side of the planned approach.  The back was then prepped and draped in the usual sterile manner and repeat timeout procedure carried out.  A spinal needle was brought down to the pedicle on the each side of  L1 and a 50-50 mix of 1% Xylocaine half percent Sensorcaine with epinephrine total of 20 cc injected on each side.  After allowing this to set a small incision was made and the trocar was advanced into the vertebral body in an extrapedicular fashion.  Biopsy was not obtained from either side despite attempt Drilling was carried out balloon inserted with inflation to  for cc on the right and 4 cc on the left.  When the cement was appropriate consistency 4-1/2 cc were injected on the right and 4 cc on the left into the vertebral body without extravasation, good fill superior to inferior endplates and from right to left sides along the inferior endplate.  After the  cement had set the trochar was removed and permanent C-arm views obtained.  The wound was closed with Dermabond followed by [place]:  Discharged home after PACU   PATIENT DISPOSITION:  PACU - hemodynamically stable.

## 2020-03-24 ENCOUNTER — Encounter: Payer: Self-pay | Admitting: Orthopedic Surgery

## 2020-03-27 LAB — SURGICAL PATHOLOGY

## 2020-03-30 ENCOUNTER — Ambulatory Visit
Admission: RE | Admit: 2020-03-30 | Discharge: 2020-03-30 | Disposition: A | Discharge status: Home / Self Care | Payer: Medicare Other | Source: Ambulatory Visit | Attending: Cardiology | Admitting: Cardiology

## 2020-03-30 ENCOUNTER — Other Ambulatory Visit: Payer: Self-pay

## 2020-03-30 ENCOUNTER — Other Ambulatory Visit
Admission: RE | Admit: 2020-03-30 | Discharge: 2020-03-30 | Disposition: A | Discharge status: Home / Self Care | Payer: Medicare Other | Source: Ambulatory Visit | Attending: Cardiology | Admitting: Cardiology

## 2020-03-30 ENCOUNTER — Other Ambulatory Visit: Payer: Self-pay | Admitting: Cardiology

## 2020-03-30 DIAGNOSIS — R600 Localized edema: Secondary | ICD-10-CM | POA: Diagnosis present

## 2020-03-30 LAB — BRAIN NATRIURETIC PEPTIDE: B Natriuretic Peptide: 256.3 pg/mL — ABNORMAL HIGH (ref 0.0–100.0)

## 2020-03-30 MED ORDER — POTASSIUM CHLORIDE ER 10 MEQ PO TBCR: 40.0000 meq | EXTENDED_RELEASE_TABLET | Freq: Every day | ORAL | Status: AC

## 2020-03-30 MED ORDER — POTASSIUM CHLORIDE CRYS ER 20 MEQ PO TBCR: 40.0000 meq | EXTENDED_RELEASE_TABLET | Freq: Once | ORAL | Status: AC

## 2020-03-30 MED ORDER — FUROSEMIDE 10 MG/ML IJ SOLN
INTRAMUSCULAR | Status: AC
  Administered 2020-03-30: 80 mg via INTRAVENOUS
  Filled 2020-03-30: qty 8

## 2020-03-30 MED ORDER — FUROSEMIDE 10 MG/ML IJ SOLN: 80.0000 mg | Freq: Once | INTRAMUSCULAR | Status: AC

## 2020-03-30 MED ORDER — POTASSIUM CHLORIDE CRYS ER 20 MEQ PO TBCR
EXTENDED_RELEASE_TABLET | ORAL | Status: AC
  Administered 2020-03-30: 40 meq via ORAL
  Filled 2020-03-30: qty 2

## 2020-04-18 ENCOUNTER — Other Ambulatory Visit
Admission: RE | Admit: 2020-04-18 | Discharge: 2020-04-18 | Disposition: A | Discharge status: Home / Self Care | Payer: Medicare Other | Source: Ambulatory Visit | Attending: Cardiology | Admitting: Cardiology

## 2020-04-18 DIAGNOSIS — I5022 Chronic systolic (congestive) heart failure: Secondary | ICD-10-CM | POA: Insufficient documentation

## 2020-04-18 LAB — BRAIN NATRIURETIC PEPTIDE: B Natriuretic Peptide: 235.8 pg/mL — ABNORMAL HIGH (ref 0.0–100.0)

## 2020-04-26 ENCOUNTER — Other Ambulatory Visit: Payer: Self-pay

## 2020-04-26 ENCOUNTER — Ambulatory Visit (INDEPENDENT_AMBULATORY_CARE_PROVIDER_SITE_OTHER): Payer: Medicare Other | Admitting: Dermatology

## 2020-04-26 DIAGNOSIS — Z1283 Encounter for screening for malignant neoplasm of skin: Secondary | ICD-10-CM

## 2020-04-26 DIAGNOSIS — L814 Other melanin hyperpigmentation: Secondary | ICD-10-CM

## 2020-04-26 DIAGNOSIS — L570 Actinic keratosis: Secondary | ICD-10-CM | POA: Diagnosis not present

## 2020-04-26 DIAGNOSIS — L720 Epidermal cyst: Secondary | ICD-10-CM

## 2020-04-26 DIAGNOSIS — B353 Tinea pedis: Secondary | ICD-10-CM | POA: Diagnosis not present

## 2020-04-26 DIAGNOSIS — D0439 Carcinoma in situ of skin of other parts of face: Secondary | ICD-10-CM | POA: Diagnosis not present

## 2020-04-26 DIAGNOSIS — L821 Other seborrheic keratosis: Secondary | ICD-10-CM

## 2020-04-26 DIAGNOSIS — D229 Melanocytic nevi, unspecified: Secondary | ICD-10-CM

## 2020-04-26 DIAGNOSIS — D1800 Hemangioma unspecified site: Secondary | ICD-10-CM

## 2020-04-26 DIAGNOSIS — D485 Neoplasm of uncertain behavior of skin: Secondary | ICD-10-CM

## 2020-04-26 DIAGNOSIS — Z86007 Personal history of in-situ neoplasm of skin: Secondary | ICD-10-CM

## 2020-04-26 DIAGNOSIS — L578 Other skin changes due to chronic exposure to nonionizing radiation: Secondary | ICD-10-CM

## 2020-04-26 DIAGNOSIS — B351 Tinea unguium: Secondary | ICD-10-CM | POA: Diagnosis not present

## 2020-04-26 DIAGNOSIS — D099 Carcinoma in situ, unspecified: Secondary | ICD-10-CM

## 2020-04-26 HISTORY — DX: Carcinoma in situ, unspecified: D09.9

## 2020-04-26 MED ORDER — CICLOPIROX OLAMINE 0.77 % EX CREA: TOPICAL_CREAM | Freq: Two times a day (BID) | CUTANEOUS | 2 refills | Status: DC

## 2020-04-26 NOTE — Patient Instructions (Addendum)
Wound Care Instructions  1. Cleanse wound gently with soap and water once a day then pat dry with clean gauze. Apply a thing coat of Petrolatum (petroleum jelly, "Vaseline") over the wound (unless you have an allergy to this). We recommend that you use a new, sterile tube of Vaseline. Do not pick or remove scabs. Do not remove the yellow or white "healing tissue" from the base of the wound.  2. Cover the wound with fresh, clean, nonstick gauze and secure with paper tape. You may use Band-Aids in place of gauze and tape if the would is small enough, but would recommend trimming much of the tape off as there is often too much. Sometimes Band-Aids can irritate the skin.  3. You should call the office for your biopsy report after 1 week if you have not already been contacted.  4. If you experience any problems, such as abnormal amounts of bleeding, swelling, significant bruising, significant pain, or evidence of infection, please call the office immediately.  5. FOR ADULT SURGERY PATIENTS: If you need something for pain relief you may take 1 extra strength Tylenol (acetaminophen) AND 2 Ibuprofen (200mg  each) together every 4 hours as needed for pain. (do not take these if you are allergic to them or if you have a reason you should not take them.) Typically, you may only need pain medication for 1 to 3 days.      Cryotherapy Aftercare  . Wash gently with soap and water everyday.   [place] Kitchen Apply Vaseline and Band-Aid daily until healed.  Recommend daily broad spectrum sunscreen SPF 30+ to sun-exposed areas, reapply every 2 hours as needed. Call for new or changing lesions.  Start ciclopirox cream to feet, in between toes twice daily for 3 weeks then continue once weekly for maintenance. If not covered can use over the counter terbinafine cream. Recommend AmLactin cream.

## 2020-04-26 NOTE — Progress Notes (Signed)
Follow-Up Visit   Subjective  Justin [name] is a 79 y.o. 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  Patient here today for TBSE and skin cancer screening. There is a spot at his right sideburn that came up about 6 months ago and has stayed sore and will not heal.   The following portions of the chart were reviewed this encounter and updated as appropriate:  Tobacco  Allergies  Meds  Problems  Med Hx  Surg Hx  Fam Hx      Review of Systems:  No other skin or systemic complaints except as noted in HPI or Assessment and Plan.  Objective  Well appearing patient in no apparent distress; mood and affect are within normal limits.  A full examination was performed including scalp, head, eyes, ears, nose, lips, neck, chest, axillae, abdomen, back, buttocks, bilateral upper extremities, bilateral lower extremities, hands, feet, fingers, toes, fingernails, and toenails. All findings within normal limits unless otherwise noted below.  Objective  Right Preauricular: 1.0cm scaly pink plaque     Objective  R frontal scalp x 2, R postauricualr x 1, L vertex x 1, L forehead x 1, L temple x 1, L preauricular x 1, L cheek x 3, L dorsal hand x 1, R vertex scalp x 3 (14): Erythematous thin papules/macules with gritty scale.   Objective  Base of left penis: Subcutaneous nodule.   Objective  Toe Nails: Thickened nails with subungual debris.  Objective  Bilateral feet: Scaling and maceration web spaces and over distal and lateral soles.    Assessment & Plan  Neoplasm of uncertain behavior of skin Right Preauricular  Skin / nail biopsy Type of biopsy: tangential   Informed consent: discussed and consent obtained   Timeout: patient name, date of birth, surgical site, and procedure verified   Patient was prepped and draped in usual sterile fashion: Area prepped with isopropyl alcohol. Anesthesia: the lesion was anesthetized in a  standard fashion   Anesthetic:  1% lidocaine w/ epinephrine 1-100,000 buffered w/ 8.4% NaHCO3 Instrument used: flexible razor blade   Hemostasis achieved with: aluminum chloride   Outcome: patient tolerated procedure well   Post-procedure details: wound care instructions given   Additional details:  Mupirocin and a bandage applied  Specimen 1 - Surgical pathology Differential Diagnosis: r/o SCC Check Margins: No 1.0cm scaly pink plaque  AK (actinic keratosis) (14) R frontal scalp x 2, R postauricualr x 1, L vertex x 1, L forehead x 1, L temple x 1, L preauricular x 1, L cheek x 3, L dorsal hand x 1, R vertex scalp x 3  Recheck L vertex at follow up  Destruction of lesion - R frontal scalp x 2, R postauricualr x 1, L vertex x 1, L forehead x 1, L temple x 1, L preauricular x 1, L cheek x 3, L dorsal hand x 1, R vertex scalp x 3 Complexity: simple   Destruction method: cryotherapy   Informed consent: discussed and consent obtained   Lesion destroyed using liquid nitrogen: Yes   Cryotherapy cycles:  2 Outcome: patient tolerated procedure well with no complications   Post-procedure details: wound care instructions given    Epidermal inclusion cyst Base of left penis  Benign-appearing.  Observation.  Call clinic for new or changing lesions.  Onychomycosis Toe Nails  Chronic, bothersome.   Discussed option of po terbinafine, deferred at this time.   Start ciclopirox cream to feet,  in between toes twice daily for 3 weeks then continue once weekly for maintenance. If not covered can use over the counter terbinafine cream. Recommend AmLactin cream.   Tinea pedis of left foot Bilateral feet  Start ciclopirox cream to feet, in between toes twice daily for 3 weeks then continue once weekly for maintenance. If not covered can use over the counter terbinafine cream. Recommend AmLactin cream.   Ordered Medications: ciclopirox (LOPROX) 0.77 % cream   Lentigines - Scattered tan  macules - Discussed due to sun exposure - Benign, observe - Call for any changes  Seborrheic Keratoses - Stuck-on, waxy, tan-brown papules and plaques  - Discussed benign etiology and prognosis. - Observe - Call for any changes  Melanocytic Nevi - Tan-brown and/or pink-flesh-colored symmetric macules and papules - Benign appearing on exam today - Observation - Call clinic for new or changing moles - Recommend daily use of broad spectrum spf 30+ sunscreen to sun-exposed areas.   Hemangiomas - Red papules - Discussed benign nature - Observe - Call for any changes  Actinic Damage - diffuse scaly erythematous macules with underlying dyspigmentation - Recommend daily broad spectrum sunscreen SPF 30+ to sun-exposed areas, reapply every 2 hours as needed.  - Call for new or changing lesions.  Skin cancer screening performed today.  History of Squamous Cell Carcinoma in Situ of the Skin - No evidence of recurrence today at right neck - Recommend regular full body skin exams - Recommend daily broad spectrum sunscreen SPF 30+ to sun-exposed areas, reapply every 2 hours as needed.  - Call if any new or changing lesions are noted between office visits  Return 2-3 months, for [place] follow up.  Graciella Belton, RMA, am acting as scribe for Forest Gleason, MD .  Documentation: I have reviewed the above documentation for accuracy and completeness, and I agree with the above.  Forest Gleason, MD

## 2020-05-02 ENCOUNTER — Encounter: Payer: Self-pay | Admitting: Dermatology

## 2020-05-02 NOTE — Progress Notes (Signed)
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   Will refer for Mohs at [facility] per patient preference and request appointment in about 6 weeks to give a little time to hopefully have pandemic spread under better control and perhaps have an update on booster vaccines since patient was vaccinated last January. Patient to call if he notes any changes to the lesion before then.

## 2020-05-03 ENCOUNTER — Other Ambulatory Visit: Payer: Self-pay

## 2020-05-03 DIAGNOSIS — D0439 Carcinoma in situ of skin of other parts of face: Secondary | ICD-10-CM

## 2020-05-03 NOTE — Progress Notes (Signed)
Referral to the skin surgery center.

## 2020-05-14 ENCOUNTER — Other Ambulatory Visit: Payer: Self-pay | Admitting: Orthopedic Surgery

## 2020-05-14 ENCOUNTER — Other Ambulatory Visit
Admission: RE | Admit: 2020-05-14 | Discharge: 2020-05-14 | Disposition: A | Discharge status: Home / Self Care | Payer: Medicare Other | Source: Ambulatory Visit | Attending: Orthopedic Surgery | Admitting: Orthopedic Surgery

## 2020-05-14 ENCOUNTER — Other Ambulatory Visit: Payer: Self-pay

## 2020-05-14 DIAGNOSIS — Z01812 Encounter for preprocedural laboratory examination: Secondary | ICD-10-CM | POA: Insufficient documentation

## 2020-05-14 DIAGNOSIS — Z20822 Contact with and (suspected) exposure to covid-19: Secondary | ICD-10-CM | POA: Insufficient documentation

## 2020-05-14 NOTE — H&P (Signed)
Chief Complaint  Patient presents with  . Follow-up  New back pain, S/P multiple kypho procedures   History of the Present Illness: Justin [name] is a 79 y.o. male here for evaluation of increased back pain after an L1 kyphoplasty that was performed on 03/23/2020. X-ray obtained today.  The patient states she had to sit in a chair for 2 hours on Monday, 05/07/2020, due to waiting for a skin surgery. That evening he started having back pain, and states his pain continued to get worse, and by Thursday his pain was severe. He had taken Tylenol, tramadol and Advil, and still had pain. The patient states his legs are not swollen this time, and they are back to normal.  The patient takes a baby aspirin daily. He is supposed to have a bone density scan at the [place] next month.  I have reviewed past medical, surgical, social and family history, and allergies as documented in the EMR.  Past Medical History: Past Medical History:  Diagnosis Date  . AR (allergic rhinitis)  . BPH (benign prostatic hypertrophy)  . CAD (coronary artery disease)  . Cataract cortical, senile  . Chronic obstructive pulmonary disease (CMS-HCC) 06/06/2019  . Diverticulosis  . Encounter for blood transfusion  . Essential hypertension 06/06/2019  . GERD (gastroesophageal reflux disease) 1996  Started takiing prilosec now omeprazole 2 daily  . H/O emphysema  . Hyperlipidemia 06/17/2011  . Myocardial infarction (CMS-HCC) 11/03/15  . Nephrolithiasis  . OSA (obstructive sleep apnea) 06/17/2011  . Osteoarthritis  . Osteoporosis 01/21/2014  IV reclast, five compression fxs requiring kyphoplasty  . Prostate cancer (CMS-HCC) 2008  Prostatectomy  . Reflux esophagitis 01/21/2014  . Skin cancer  neck   Past Surgical History: Past Surgical History:  Procedure Laterality Date  . BACK SURGERY 2009, 2011  . CARDIAC CATHETERIZATION  . CATARACT EXTRACTION  . CHOLECYSTECTOMY 1997  . COLONOSCOPY 11/27/1999  Tubulovillous Polyp  .  COLONOSCOPY 01/11/2002  PH Adenomatous Polyp  . COLONOSCOPY 07/15/2005  Serrated Adenoma  . COLONOSCOPY 07/17/2008  PH Adenomatous Polyps  . COLONOSCOPY 10/31/2011  Adenomatous Polyp: CBF 10/2016  . COLONOSCOPY 03/27/2017  Adenomatous Polyps: CBF 03/2020  . CORONARY ANGIOPLASTY 11/07/15  . CORONARY ARTERY BYPASS GRAFT  . EGD 02/13/2011  No repeat per MUS  . EGD 03/24/2017  No repeat per RTE  . FRACTURE SURGERY  compression fractures see back surgery  . HERNIA REPAIR 1979  . OTHER SURGERY 2021  Back surgery  . PROSTATE SURGERY 2008  . RETROPUBIC PROSTATECTOMY  . Skin cancer removal  . SKIN CANCER REMOVED Right 08/01/2019  Right side of neck   Past Family History: Family History  Problem Relation Age of Onset  . Osteoporosis (Thinning of bones) Mother  . Hip fracture Mother  . Prostate cancer Father  . Asthma Father  . Cancer Father  . Diabetes type II Sister  . Osteoporosis (Thinning of bones) Sister  . High blood pressure (Hypertension) Sister  . Myocardial Infarction (Heart attack) Brother  . Heart disease Brother  . Colon polyps Brother  . Coronary Artery Disease (Blocked arteries around heart) Brother  . Skin cancer Brother  . High blood pressure (Hypertension) Sister   Medications: Current Outpatient Medications Ordered in Epic  Medication Sig Dispense Refill  . *niacin oral tablet 500mg  tablet 500mg  1 tab by mouth 3 times a day  . acetaminophen (TYLENOL) 650 MG ER tablet Take 650 mg by mouth every 8 (eight) hours as needed for Pain  . albuterol 90 mcg/actuation  inhaler Inhale 2 inhalations into the lungs every 6 (six) hours as needed for Wheezing 3 Inhaler 3  . ascorbic acid/multivit-min (EMERGEN-C ORAL) Take by mouth One packet daily  . aspirin 81 MG EC tablet Take 81 mg by mouth once daily.  Marland Kitchen atorvastatin (LIPITOR) 80 MG tablet Take 40 mg by mouth nightly.  . BENEFIBER, WHEAT DEXTRIN, ORAL Take by mouth  . bumetanide (BUMEX) 0.5 MG tablet Take 1 tablet  (0.5 mg total) by mouth once daily as needed (edema) 30 tablet 11  . calcium carbonate-vitamin D3 (CALTRATE 600+D) 600 mg(1,500mg ) -400 unit tablet Take 1 tablet by mouth 2 (two) times daily with meals.  . cholecalciferol (VITAMIN D3) 1,000 unit capsule Take 3,000 Units by mouth once daily  . cyanocobalamin (VITAMIN B12) 1000 MCG tablet Take 1,000 mcg by mouth once daily.  . fexofenadine (ALLEGRA) 180 MG tablet Take 180 mg by mouth once daily.  . fluticasone (FLONASE) 50 mcg/actuation nasal spray Place 1 spray into both nostrils nightly (Patient taking differently: Place 2 sprays into both nostrils nightly ) 16 g 6  . gabapentin (NEURONTIN) 300 MG capsule Take 300 mg by mouth once daily  . hydrocortisone (ANUSOL-HC) 25 mg suppository Place 25 mg rectally once daily as needed for Hemorrhoids.  Marland Kitchen ibuprofen (MOTRIN) 400 MG tablet Take 400 mg by mouth 3 (three) times daily  . isosorbide mononitrate (IMDUR) 60 MG ER tablet Take 60 mg by mouth once daily.  Marland Kitchen LACTOBACILLUS ACIDOPHILUS/PECT (LACTOBACILLUS ACIDOPH-PECTIN ORAL) Take 2 capsules by mouth once daily.  . magnesium oxide (MAG-OX) 400 mg tablet Take 400 mg by mouth 2 (two) times daily.   . metoprolol tartrate (LOPRESSOR) 25 MG tablet Take 25 mg by mouth 2 (two) times daily  . nitroglycerin (NITROSTAT) 0.4 MG SL tablet Place 1 tablet under tongue as needed for chest pain (may repeat every 5 minutes but seek medical help if pain persists after 3 tablets) as needed  . omega-3 fatty acids 1,000 mg Cap 1 tab by mouth 2 times a day  . omeprazole (PRILOSEC) 20 MG DR capsule 2 cap by mouth daily (Patient taking differently: Take 1 capsule by mouth 2 (two) times daily. )  . potassium chloride (KLOR-CON) 20 MEQ ER tablet Take 20 mEq by mouth 2 (two) times daily  . sennosides-docusate (SENOKOT-S) 8.6-50 mg tablet Take 2 tablets by mouth 2 (two) times daily.  . sodium phosphates (FLEET) enema Place 1 enema rectally once  . sour cherry extract (TART CHERRY  EXTRACT) 1,000 mg Cap Take 3,000 mg by mouth once daily  . tiotropium-olodateroL (STIOLTO RESPIMAT) 2.5-2.5 mcg/actuation inhaler Inhale 2 inhalations into the lungs once daily 12 g 3  . TORsemide (DEMADEX) 20 MG tablet Take 1 tablet (20 mg total) by mouth 3 (three) times daily Reported on 01/28/2016 90 tablet 6  . traMADoL (ULTRAM) 50 mg tablet Take 1 tablet (50 mg total) by mouth every 6 (six) hours as needed for Pain for up to 5 days 50 tablet 0  . HYDROcodone-acetaminophen (NORCO) 5-325 mg tablet Take 1 tablet by mouth every 6 (six) hours as needed 15 tablet 0  . traMADoL (ULTRAM) 50 mg tablet Take 1 tablet (50 mg total) by mouth 2 (two) times daily for 30 days (Patient not taking: Reported on 05/14/2020 ) 60 tablet 1   No current Epic-ordered facility-administered medications on file.   Allergies: Allergies  Allergen Reactions  . Maxzide [Triamterene-Hydrochlorothiazid] Nausea    Body mass index is 29.38 kg/m.  Review of  Systems: A comprehensive 14 point ROS was performed, reviewed, and the pertinent orthopaedic findings are documented in the HPI.  Vitals:  05/14/20 0800  BP: 136/82   General Physical Examination:  General/Constitutional: No apparent distress: well-nourished and well developed. Eyes: Pupils equal, round with synchronous movement. Lungs: Clear to auscultation HEENT: Normal Vascular: No edema, swelling or tenderness, except as noted in detailed exam. Cardiac: Heart rate and rhythm is regular. Integumentary: No impressive skin lesions present, except as noted in detailed exam. Neuro/Psych: Normal mood and affect, oriented to person, place and time.  Musculoskeletal Examination: On exam, tenderness at T12. Lungs are clear. Heart rate and rhythm is normal. HEENT is normal. No clonus.  Radiographs: AP and lateral x-rays of the thoracolumbar spine were ordered and personally reviewed today. These show L1 kyphoplasty, as well as T11, T9, T8, and T7. There is wedge  compression at L1. On comparison to prior x-ray from 04/06/2020, that is a new new fracture.  X-ray Impression New L1 fracture since 04/06/2020.  Assessment: ICD-10-CM  1. Status post kyphoplasty Z98.890  2. Midline low back pain without sciatica, unspecified chronicity M54.5  3. Thoracic compression fracture, closed, initial encounter (CMS-HCC) S22.000A   Plan: The patient has clinical findings of acute T12 fracture.  We discussed the patient's x-ray findings. He has had no relief with pain medicaiton. We will plan on T12 kyphoplasty.  Surgical Risks:  The nature of the condition and the proposed procedure has been reviewed in detail with the patient. Surgical versus non-surgical options and prognosis for recovery have been reviewed and the inherent risks and benefits of each have been discussed including the risks of infection, bleeding, injury to nerves/blood vessels/tendons, incomplete relief of symptoms, persisting pain and/or stiffness, loss of function, complex regional pain syndrome, failure of the procedure, as appropriate.  Teeth: Partial upper and lower dentures.  Scribe Attestation: I, Dawn Royse, am acting as scribe for [employer], MD    Electronically signed by Lauris Poag, MD at 05/14/2020 8:26 PM EDT  Reviewed paper H+P, will be scanned into chart. No changes noted.

## 2020-05-15 ENCOUNTER — Ambulatory Visit: Payer: Medicare Other | Admitting: Anesthesiology

## 2020-05-15 ENCOUNTER — Encounter
Admission: RE | Disposition: A | Discharge status: Home / Self Care | Payer: Self-pay | Source: Home / Self Care | Attending: Orthopedic Surgery

## 2020-05-15 ENCOUNTER — Encounter: Payer: Self-pay | Admitting: Orthopedic Surgery

## 2020-05-15 ENCOUNTER — Ambulatory Visit
Admission: RE | Admit: 2020-05-15 | Discharge: 2020-05-15 | Disposition: A | Discharge status: Home / Self Care | Payer: Medicare Other | Attending: Orthopedic Surgery | Admitting: Orthopedic Surgery

## 2020-05-15 ENCOUNTER — Ambulatory Visit: Payer: Medicare Other

## 2020-05-15 DIAGNOSIS — Z951 Presence of aortocoronary bypass graft: Secondary | ICD-10-CM | POA: Diagnosis not present

## 2020-05-15 DIAGNOSIS — J439 Emphysema, unspecified: Secondary | ICD-10-CM | POA: Insufficient documentation

## 2020-05-15 DIAGNOSIS — I739 Peripheral vascular disease, unspecified: Secondary | ICD-10-CM | POA: Diagnosis not present

## 2020-05-15 DIAGNOSIS — Z7982 Long term (current) use of aspirin: Secondary | ICD-10-CM | POA: Diagnosis not present

## 2020-05-15 DIAGNOSIS — E785 Hyperlipidemia, unspecified: Secondary | ICD-10-CM | POA: Insufficient documentation

## 2020-05-15 DIAGNOSIS — S22080A Wedge compression fracture of T11-T12 vertebra, initial encounter for closed fracture: Secondary | ICD-10-CM | POA: Diagnosis present

## 2020-05-15 DIAGNOSIS — I129 Hypertensive chronic kidney disease with stage 1 through stage 4 chronic kidney disease, or unspecified chronic kidney disease: Secondary | ICD-10-CM | POA: Diagnosis not present

## 2020-05-15 DIAGNOSIS — X58XXXA Exposure to other specified factors, initial encounter: Secondary | ICD-10-CM | POA: Insufficient documentation

## 2020-05-15 DIAGNOSIS — Z87891 Personal history of nicotine dependence: Secondary | ICD-10-CM | POA: Insufficient documentation

## 2020-05-15 DIAGNOSIS — Z8546 Personal history of malignant neoplasm of prostate: Secondary | ICD-10-CM | POA: Insufficient documentation

## 2020-05-15 DIAGNOSIS — N189 Chronic kidney disease, unspecified: Secondary | ICD-10-CM | POA: Diagnosis not present

## 2020-05-15 DIAGNOSIS — Z85828 Personal history of other malignant neoplasm of skin: Secondary | ICD-10-CM | POA: Diagnosis not present

## 2020-05-15 DIAGNOSIS — I252 Old myocardial infarction: Secondary | ICD-10-CM | POA: Insufficient documentation

## 2020-05-15 DIAGNOSIS — G4733 Obstructive sleep apnea (adult) (pediatric): Secondary | ICD-10-CM | POA: Diagnosis not present

## 2020-05-15 DIAGNOSIS — J309 Allergic rhinitis, unspecified: Secondary | ICD-10-CM | POA: Diagnosis not present

## 2020-05-15 DIAGNOSIS — Z419 Encounter for procedure for purposes other than remedying health state, unspecified: Secondary | ICD-10-CM

## 2020-05-15 DIAGNOSIS — M810 Age-related osteoporosis without current pathological fracture: Secondary | ICD-10-CM | POA: Insufficient documentation

## 2020-05-15 DIAGNOSIS — K2100 Gastro-esophageal reflux disease with esophagitis, without bleeding: Secondary | ICD-10-CM | POA: Insufficient documentation

## 2020-05-15 DIAGNOSIS — I2510 Atherosclerotic heart disease of native coronary artery without angina pectoris: Secondary | ICD-10-CM | POA: Diagnosis not present

## 2020-05-15 DIAGNOSIS — Z79899 Other long term (current) drug therapy: Secondary | ICD-10-CM | POA: Diagnosis not present

## 2020-05-15 HISTORY — PX: KYPHOPLASTY: SHX5884

## 2020-05-15 LAB — SARS CORONAVIRUS 2 (TAT 6-24 HRS): SARS Coronavirus 2: NEGATIVE

## 2020-05-15 SURGERY — KYPHOPLASTY
Anesthesia: General

## 2020-05-15 MED ORDER — MIDAZOLAM HCL 2 MG/2ML IJ SOLN
INTRAMUSCULAR | Status: DC | PRN
  Administered 2020-05-15 (×2): .5 mg via INTRAVENOUS
  Administered 2020-05-15: 1 mg via INTRAVENOUS

## 2020-05-15 MED ORDER — PROPOFOL 10 MG/ML IV BOLUS
INTRAVENOUS | Status: AC
  Filled 2020-05-15: qty 20

## 2020-05-15 MED ORDER — LIDOCAINE HCL (PF) 1 % IJ SOLN
INTRAMUSCULAR | Status: AC
  Filled 2020-05-15: qty 30

## 2020-05-15 MED ORDER — ONDANSETRON HCL 4 MG/2ML IJ SOLN: 4.0000 mg | Freq: Once | INTRAMUSCULAR | Status: DC | PRN

## 2020-05-15 MED ORDER — MIDAZOLAM HCL 2 MG/2ML IJ SOLN
INTRAMUSCULAR | Status: AC
  Filled 2020-05-15: qty 2

## 2020-05-15 MED ORDER — CHLORHEXIDINE GLUCONATE 0.12 % MT SOLN: 15.0000 mL | Freq: Once | OROMUCOSAL | Status: DC

## 2020-05-15 MED ORDER — PROPOFOL 500 MG/50ML IV EMUL
INTRAVENOUS | Status: DC | PRN
  Administered 2020-05-15: 50 ug/kg/min via INTRAVENOUS

## 2020-05-15 MED ORDER — FENTANYL CITRATE (PF) 100 MCG/2ML IJ SOLN
INTRAMUSCULAR | Status: AC
  Filled 2020-05-15: qty 2

## 2020-05-15 MED ORDER — LIDOCAINE HCL 1 % IJ SOLN
INTRAMUSCULAR | Status: DC | PRN
  Administered 2020-05-15: 30 mL

## 2020-05-15 MED ORDER — ORAL CARE MOUTH RINSE: 15.0000 mL | Freq: Once | OROMUCOSAL | Status: DC

## 2020-05-15 MED ORDER — FENTANYL CITRATE (PF) 100 MCG/2ML IJ SOLN
INTRAMUSCULAR | Status: DC | PRN
Start: 2020-05-15 — End: 2020-05-15
  Administered 2020-05-15 (×2): 25 ug via INTRAVENOUS
  Administered 2020-05-15: 50 ug via INTRAVENOUS

## 2020-05-15 MED ORDER — LACTATED RINGERS IV SOLN: INTRAVENOUS | Status: DC

## 2020-05-15 MED ORDER — BUPIVACAINE-EPINEPHRINE (PF) 0.5% -1:200000 IJ SOLN
INTRAMUSCULAR | Status: AC
  Filled 2020-05-15: qty 30

## 2020-05-15 MED ORDER — CEFAZOLIN SODIUM-DEXTROSE 2-4 GM/100ML-% IV SOLN: 2.0000 g | INTRAVENOUS | Status: DC

## 2020-05-15 MED ORDER — FENTANYL CITRATE (PF) 100 MCG/2ML IJ SOLN
25.0000 ug | INTRAMUSCULAR | Status: DC | PRN
  Administered 2020-05-15 (×2): 25 ug via INTRAVENOUS

## 2020-05-15 MED ORDER — BUPIVACAINE-EPINEPHRINE (PF) 0.5% -1:200000 IJ SOLN
INTRAMUSCULAR | Status: DC | PRN
  Administered 2020-05-15: 20 mL

## 2020-05-15 MED ORDER — CHLORHEXIDINE GLUCONATE 0.12 % MT SOLN
OROMUCOSAL | Status: AC
  Filled 2020-05-15: qty 15

## 2020-05-15 MED ORDER — FENTANYL CITRATE (PF) 100 MCG/2ML IJ SOLN
INTRAMUSCULAR | Status: DC
Start: 2020-05-15 — End: 2020-05-15
  Filled 2020-05-15: qty 2

## 2020-05-15 MED ORDER — CEFAZOLIN SODIUM-DEXTROSE 2-4 GM/100ML-% IV SOLN
INTRAVENOUS | Status: AC
  Filled 2020-05-15: qty 100

## 2020-05-15 MED ORDER — ONDANSETRON HCL 4 MG/2ML IJ SOLN
INTRAMUSCULAR | Status: DC | PRN
  Administered 2020-05-15: 4 mg via INTRAVENOUS

## 2020-05-15 SURGICAL SUPPLY — 21 items
ADH SKN CLS APL DERMABOND .7 (GAUZE/BANDAGES/DRESSINGS) ×1
CEMENT KYPHON CX01A KIT/MIXER (Cement) ×3 IMPLANT
COVER WAND RF STERILE (DRAPES) ×3 IMPLANT
DERMABOND ADVANCED (GAUZE/BANDAGES/DRESSINGS) ×2
DERMABOND ADVANCED .7 DNX12 (GAUZE/BANDAGES/DRESSINGS) ×1 IMPLANT
DEVICE BIOPSY BONE KYPHX (INSTRUMENTS) ×3 IMPLANT
DRAPE C-ARM XRAY 36X54 (DRAPES) ×3 IMPLANT
DURAPREP 26ML APPLICATOR (WOUND CARE) ×3 IMPLANT
GLOVE SURG SYN 9.0  PF PI (GLOVE) ×2
GLOVE SURG SYN 9.0 PF PI (GLOVE) ×1 IMPLANT
GOWN SRG 2XL LVL 4 RGLN SLV (GOWNS) ×1 IMPLANT
GOWN STRL NON-REIN 2XL LVL4 (GOWNS) ×3
GOWN STRL REUS W/ TWL LRG LVL3 (GOWN DISPOSABLE) ×1 IMPLANT
GOWN STRL REUS W/TWL LRG LVL3 (GOWN DISPOSABLE) ×3
PACK KYPHOPLASTY (MISCELLANEOUS) ×3 IMPLANT
RENTAL RFA  GENERATOR (MISCELLANEOUS)
RENTAL RFA GENERATOR (MISCELLANEOUS) IMPLANT
STRAP SAFETY 5IN WIDE (MISCELLANEOUS) ×3 IMPLANT
SWABSTK COMLB BENZOIN TINCTURE (MISCELLANEOUS) ×3 IMPLANT
TRAY KYPHOPAK 15/3 EXPRESS 1ST (MISCELLANEOUS) ×3 IMPLANT
TRAY KYPHOPAK 20/3 EXPRESS 1ST (MISCELLANEOUS) IMPLANT

## 2020-05-15 NOTE — Anesthesia Preprocedure Evaluation (Signed)
Anesthesia Evaluation  Patient identified by MRN, date of birth, ID band Patient awake    Reviewed: Allergy & Precautions, H&P , NPO status , Patient's Chart, lab work & pertinent test results  Airway Mallampati: II  TM Distance: >3 FB     Dental  (+) Chipped, Missing   Pulmonary shortness of breath, sleep apnea , COPD, former smoker,    breath sounds clear to auscultation       Cardiovascular (-) angina+ CAD, + Past MI and + Peripheral Vascular Disease  + dysrhythmias  Rhythm:regular Rate:Normal     Neuro/Psych negative neurological ROS  negative psych ROS   GI/Hepatic Neg liver ROS, GERD  Controlled,  Endo/Other  negative endocrine ROS  Renal/GU Renal stones  negative genitourinary   Musculoskeletal  (+) Arthritis , Osteoarthritis,    Abdominal   Peds  Hematology negative hematology ROS (+)   Anesthesia Other Findings Past Medical History: No date: Anginal pain (HCC) No date: Arthritis 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 No date: CAD (coronary artery disease) No date: Chronic kidney disease No date: COPD (chronic obstructive pulmonary disease) (HCC) No date: DJD (degenerative joint disease) No date: Does use hearing aid     Comment:  bilateral No date: Dyspnea No date: Dysrhythmia No date: GERD (gastroesophageal reflux disease) No date: History of kidney stones No date: History of open heart surgery No date: Myocardial infarction ([place]) No date: Skin cancer No date: Sleep apnea No date: Wears dentures     Comment:  partial upper and lower  Past Surgical History: No date: BACK SURGERY 11/07/2015: CARDIAC CATHETERIZATION; N/A     Comment:  Procedure: Left Heart Cath and Coronary Angiography;                Surgeon: Teodoro Spray, MD;  Location: [facility]               [facility];  Service: Cardiovascular;  Laterality: N/A; 11/07/2015: CARDIAC CATHETERIZATION; N/A     Comment:   Procedure: Coronary Stent Intervention;  Surgeon:               Isaias Cowman, MD;  Location: [facility];              Service: Cardiovascular;  Laterality: N/A; No date: CATARACT EXTRACTION No date: CHOLECYSTECTOMY 03/27/2017: COLONOSCOPY WITH PROPOFOL; N/A     Comment:  Procedure: COLONOSCOPY WITH PROPOFOL;  Surgeon: Manya Silvas, MD;  Location[facility] ENDOSCOPY;  Service:               Endoscopy;  Laterality: N/A; No date: CORONARY ANGIOPLASTY No date: CORONARY ARTERY BYPASS GRAFT 03/27/2017: ESOPHAGOGASTRODUODENOSCOPY (EGD) WITH PROPOFOL; N/A     Comment:  Procedure: ESOPHAGOGASTRODUODENOSCOPY (EGD) WITH               PROPOFOL;  Surgeon: Manya Silvas, MD;  Location:               [facility] ENDOSCOPY;  Service: Endoscopy;  Laterality: N/A; 09/10/2017: EXCISION PARTIAL PHALANX; Right     Comment:  Procedure: EXCISION PARTIAL PHALANX-2nd toe;  Surgeon:               Albertine Patricia, DPM;  Location: [place];                Service: Podiatry;  Laterality: Right;  sleep apnea No date: EYE  SURGERY No date: HERNIA REMOVED No date: HERNIA REPAIR 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 No date: kyphoplasty t10 t11 09/28/2018: LEFT HEART CATH AND CORONARY ANGIOGRAPHY; Left     Comment:  Procedure: LEFT HEART CATH AND CORONARY ANGIOGRAPHY;                Surgeon: Teodoro Spray, MD;  Location: [place] [facility] [facility]              [facility];  Service: Cardiovascular;  Laterality: Left; No date: OPEN HEART SURGERY No date: RETROPUBIC PROSTATECTOMY No date: SKIN CANCER REMOVED     Reproductive/Obstetrics negative OB ROS                             Anesthesia Physical  Anesthesia Plan  ASA: III  Anesthesia Plan: General   Post-op Pain Management:    Induction:   PONV Risk Score and Plan: Propofol infusion and TIVA  Airway Management Planned: Natural Airway and Nasal Cannula  Additional Equipment:   Intra-op Plan:   Post-operative  Plan:   Informed Consent: I have reviewed the patients History and Physical, chart, labs and discussed the procedure including the risks, benefits and alternatives for the proposed anesthesia with the patient or authorized representative who has indicated his/her understanding and acceptance.     Dental Advisory Given  Plan Discussed with: Anesthesiologist, CRNA and Surgeon  Anesthesia Plan Comments:         Anesthesia Quick Evaluation

## 2020-05-15 NOTE — Anesthesia Procedure Notes (Signed)
Date/Time: 05/15/2020 1:05 PM Performed by: Nelda Marseille, CRNA Pre-anesthesia Checklist: Patient identified, Emergency Drugs available, Suction available, Patient being monitored and Timeout performed Oxygen Delivery Method: Nasal cannula

## 2020-05-15 NOTE — Anesthesia Postprocedure Evaluation (Signed)
Anesthesia Post Note  Patient: Justin [name]  Procedure(s) Performed: T 12 Kyphoplasty (N/A )  Patient location during evaluation: PACU Anesthesia Type: General Level of consciousness: awake and alert and oriented Pain management: pain level controlled Vital Signs Assessment: post-procedure vital signs reviewed and stable Respiratory status: spontaneous breathing Cardiovascular status: blood pressure returned to baseline Anesthetic complications: no   No complications documented.   Last Vitals:  Vitals:   05/15/20 1427 05/15/20 1437  BP:  116/60  Pulse: 74 75  Resp:  18  Temp:  36.6 C  SpO2: 95% 94%    Last Pain:  Vitals:   05/15/20 1437  TempSrc: Temporal  PainSc: 3                  Wilberto Console

## 2020-05-15 NOTE — Op Note (Signed)
Date May 15, 2020  time 1:33 PM   PATIENT: Justin [name]   PRE-OPERATIVE DIAGNOSIS:  closed wedge compression fracture of T12   POST-OPERATIVE DIAGNOSIS:  closed wedge compression fracture of T12   PROCEDURE:  Procedure(s): KYPHOPLASTY T12  SURGEON: Laurene Footman, MD   ASSISTANTS: None   ANESTHESIA:   local and MAC   EBL:  No intake/output data recorded.   BLOOD ADMINISTERED:none   DRAINS: none    LOCAL MEDICATIONS USED:  MARCAINE    and XYLOCAINE    SPECIMEN:   None   DISPOSITION OF SPECIMEN:  Not applicable   COUNTS:  YES   TOURNIQUET:  * No tourniquets in log *   IMPLANTS: Bone cement   DICTATION: .Dragon Dictation  patient was brought to the operating room and after adequate anesthesia was obtained the patient was placed prone.  C arm was brought in in good visualization of the affected level obtained on both AP and lateral projections.  After patient identification and timeout procedures were completed, local anesthetic was infiltrated with 10 cc 1% Xylocaine infiltrated subcutaneously.  This is done the area on the each side of the planned approach.  The back was then prepped and draped in the usual sterile manner and repeat timeout procedure carried out.  A spinal needle was brought down to the pedicle on the each side of  T12 and a 50-50 mix of 1% Xylocaine half percent Sensorcaine with epinephrine total of 20 cc injected on each side.  After allowing this to set a small incision was made and the trocar was advanced into the vertebral body in an extrapedicular fashion.  Biopsy was attempted but not obtained Drilling was carried out balloon inserted with inflation to  2 cc on the right and 2 cc on the left.  When the cement was appropriate consistency 3-1/2 cc were injected on the right and 3-1/2 cc on the left into the vertebral body without extravasation, good fill superior to inferior endplates and from right to left sides along the inferior endplate.  After the  cement had set the trochar was removed and permanent C-arm views obtained.  The wound was closed with Dermabond followed by [place]:  Discharged home after recovery room   PATIENT DISPOSITION:  PACU - hemodynamically stable.

## 2020-05-15 NOTE — Discharge Instructions (Signed)
AMBULATORY SURGERY  DISCHARGE INSTRUCTIONS   1) The drugs that you were given will stay in your system until tomorrow so for the next 24 hours you should not:  A) Drive an automobile B) Make any legal decisions C) Drink any alcoholic beverage   2) You may resume regular meals tomorrow.  Today it is better to start with liquids and gradually work up to solid foods.  You may eat anything you prefer, but it is better to start with liquids, then soup and crackers, and gradually work up to solid foods.   3) Please notify your doctor immediately if you have any unusual bleeding, trouble breathing, redness and pain at the surgery site, drainage, fever, or pain not relieved by medication.    4) Additional Instructions:        Please contact your physician with any problems or Same Day Surgery at [phone], Monday through Friday 6 am to 4 pm, or [place] at [facility] number at [phone].Take it easy today and tomorrow and then resume more normal activities. Remove Band-Aids on Thursday then okay to shower. Pain medicine as previously prescribed. Call office if any increase in pain.

## 2020-05-15 NOTE — Transfer of Care (Signed)
Immediate Anesthesia Transfer of Care Note  Patient: Justin [name]  Procedure(s) Performed: T 12 Kyphoplasty (N/A )  Patient Location: PACU  Anesthesia Type:General  Level of Consciousness: awake, alert  and oriented  Airway & Oxygen Therapy: Patient Spontanous Breathing and Patient connected to nasal cannula oxygen  Post-op Assessment: Report given to RN and Post -op Vital signs reviewed and stable  Post vital signs: Reviewed and stable  Last Vitals:  Vitals Value Taken Time  BP 111/65 05/15/20 1333  Temp 36.3 C 05/15/20 1333  Pulse 79 05/15/20 1338  Resp 15 05/15/20 1338  SpO2 96 % 05/15/20 1338  Vitals shown include unvalidated device data.  Last Pain:  Vitals:   05/15/20 1333  TempSrc:   PainSc: 4          Complications: No complications documented.

## 2020-05-15 NOTE — Progress Notes (Signed)
Updated pt's wife via telephone call

## 2020-05-16 ENCOUNTER — Encounter: Payer: Self-pay | Admitting: Orthopedic Surgery

## 2020-05-22 ENCOUNTER — Encounter: Payer: Self-pay | Admitting: Orthopedic Surgery

## 2020-05-22 NOTE — Addendum Note (Signed)
Addendum  created 05/22/20 1328 by Doreen Salvage, CRNA   Intraprocedure Event edited

## 2020-06-07 ENCOUNTER — Other Ambulatory Visit: Payer: Self-pay | Admitting: Cardiology

## 2020-06-07 DIAGNOSIS — R600 Localized edema: Secondary | ICD-10-CM

## 2020-06-08 ENCOUNTER — Other Ambulatory Visit: Payer: Self-pay

## 2020-06-08 ENCOUNTER — Ambulatory Visit
Admission: RE | Admit: 2020-06-08 | Discharge: 2020-06-08 | Disposition: A | Discharge status: Home / Self Care | Payer: Medicare Other | Source: Ambulatory Visit | Attending: Cardiology | Admitting: Cardiology

## 2020-06-08 DIAGNOSIS — R600 Localized edema: Secondary | ICD-10-CM | POA: Diagnosis not present

## 2020-06-11 ENCOUNTER — Other Ambulatory Visit: Payer: Self-pay

## 2020-06-11 ENCOUNTER — Other Ambulatory Visit
Admission: RE | Admit: 2020-06-11 | Discharge: 2020-06-11 | Disposition: A | Discharge status: Home / Self Care | Payer: Medicare Other | Source: Ambulatory Visit | Attending: Internal Medicine | Admitting: Internal Medicine

## 2020-06-11 DIAGNOSIS — Z01812 Encounter for preprocedural laboratory examination: Secondary | ICD-10-CM | POA: Diagnosis present

## 2020-06-11 DIAGNOSIS — Z20822 Contact with and (suspected) exposure to covid-19: Secondary | ICD-10-CM | POA: Diagnosis not present

## 2020-06-12 ENCOUNTER — Encounter: Payer: Self-pay | Admitting: Internal Medicine

## 2020-06-12 LAB — SARS CORONAVIRUS 2 (TAT 6-24 HRS): SARS Coronavirus 2: NEGATIVE

## 2020-06-13 ENCOUNTER — Ambulatory Visit
Admission: RE | Admit: 2020-06-13 | Discharge: 2020-06-13 | Disposition: A | Discharge status: Home / Self Care | Payer: Medicare Other | Attending: Internal Medicine | Admitting: Internal Medicine

## 2020-06-13 ENCOUNTER — Ambulatory Visit: Payer: Medicare Other | Admitting: Certified Registered Nurse Anesthetist

## 2020-06-13 ENCOUNTER — Other Ambulatory Visit: Payer: Self-pay

## 2020-06-13 ENCOUNTER — Encounter
Admission: RE | Disposition: A | Discharge status: Home / Self Care | Payer: Self-pay | Source: Home / Self Care | Attending: Internal Medicine

## 2020-06-13 ENCOUNTER — Encounter: Payer: Self-pay | Admitting: Internal Medicine

## 2020-06-13 DIAGNOSIS — K640 First degree hemorrhoids: Secondary | ICD-10-CM | POA: Diagnosis not present

## 2020-06-13 DIAGNOSIS — M1990 Unspecified osteoarthritis, unspecified site: Secondary | ICD-10-CM | POA: Insufficient documentation

## 2020-06-13 DIAGNOSIS — N189 Chronic kidney disease, unspecified: Secondary | ICD-10-CM | POA: Insufficient documentation

## 2020-06-13 DIAGNOSIS — N400 Enlarged prostate without lower urinary tract symptoms: Secondary | ICD-10-CM | POA: Insufficient documentation

## 2020-06-13 DIAGNOSIS — Z791 Long term (current) use of non-steroidal anti-inflammatories (NSAID): Secondary | ICD-10-CM | POA: Insufficient documentation

## 2020-06-13 DIAGNOSIS — H269 Unspecified cataract: Secondary | ICD-10-CM | POA: Insufficient documentation

## 2020-06-13 DIAGNOSIS — D122 Benign neoplasm of ascending colon: Secondary | ICD-10-CM | POA: Diagnosis not present

## 2020-06-13 DIAGNOSIS — K5730 Diverticulosis of large intestine without perforation or abscess without bleeding: Secondary | ICD-10-CM | POA: Insufficient documentation

## 2020-06-13 DIAGNOSIS — M810 Age-related osteoporosis without current pathological fracture: Secondary | ICD-10-CM | POA: Insufficient documentation

## 2020-06-13 DIAGNOSIS — Z8546 Personal history of malignant neoplasm of prostate: Secondary | ICD-10-CM | POA: Insufficient documentation

## 2020-06-13 DIAGNOSIS — I252 Old myocardial infarction: Secondary | ICD-10-CM | POA: Diagnosis not present

## 2020-06-13 DIAGNOSIS — J449 Chronic obstructive pulmonary disease, unspecified: Secondary | ICD-10-CM | POA: Diagnosis not present

## 2020-06-13 DIAGNOSIS — I2510 Atherosclerotic heart disease of native coronary artery without angina pectoris: Secondary | ICD-10-CM | POA: Insufficient documentation

## 2020-06-13 DIAGNOSIS — D120 Benign neoplasm of cecum: Secondary | ICD-10-CM | POA: Diagnosis not present

## 2020-06-13 DIAGNOSIS — Z888 Allergy status to other drugs, medicaments and biological substances status: Secondary | ICD-10-CM | POA: Diagnosis not present

## 2020-06-13 DIAGNOSIS — I129 Hypertensive chronic kidney disease with stage 1 through stage 4 chronic kidney disease, or unspecified chronic kidney disease: Secondary | ICD-10-CM | POA: Insufficient documentation

## 2020-06-13 DIAGNOSIS — D123 Benign neoplasm of transverse colon: Secondary | ICD-10-CM | POA: Insufficient documentation

## 2020-06-13 DIAGNOSIS — Z79899 Other long term (current) drug therapy: Secondary | ICD-10-CM | POA: Diagnosis not present

## 2020-06-13 DIAGNOSIS — Z7982 Long term (current) use of aspirin: Secondary | ICD-10-CM | POA: Diagnosis not present

## 2020-06-13 DIAGNOSIS — Z86010 Personal history of colonic polyps: Secondary | ICD-10-CM | POA: Diagnosis not present

## 2020-06-13 DIAGNOSIS — K219 Gastro-esophageal reflux disease without esophagitis: Secondary | ICD-10-CM | POA: Diagnosis not present

## 2020-06-13 DIAGNOSIS — Z1211 Encounter for screening for malignant neoplasm of colon: Secondary | ICD-10-CM | POA: Diagnosis not present

## 2020-06-13 DIAGNOSIS — G4730 Sleep apnea, unspecified: Secondary | ICD-10-CM | POA: Diagnosis not present

## 2020-06-13 DIAGNOSIS — K6389 Other specified diseases of intestine: Secondary | ICD-10-CM | POA: Insufficient documentation

## 2020-06-13 DIAGNOSIS — Z85828 Personal history of other malignant neoplasm of skin: Secondary | ICD-10-CM | POA: Insufficient documentation

## 2020-06-13 HISTORY — DX: Benign prostatic hyperplasia without lower urinary tract symptoms: N40.0

## 2020-06-13 HISTORY — DX: Malignant neoplasm of prostate: C61

## 2020-06-13 HISTORY — DX: Essential (primary) hypertension: I10

## 2020-06-13 HISTORY — DX: Unspecified cataract: H26.9

## 2020-06-13 HISTORY — DX: Age-related osteoporosis without current pathological fracture: M81.0

## 2020-06-13 HISTORY — PX: COLONOSCOPY: SHX5424

## 2020-06-13 HISTORY — DX: Benign neoplasm, unspecified site: D36.9

## 2020-06-13 SURGERY — COLONOSCOPY
Anesthesia: General

## 2020-06-13 MED ORDER — LIDOCAINE HCL (PF) 2 % IJ SOLN
INTRAMUSCULAR | Status: AC
  Filled 2020-06-13: qty 5

## 2020-06-13 MED ORDER — PROPOFOL 500 MG/50ML IV EMUL
INTRAVENOUS | Status: DC | PRN
  Administered 2020-06-13: 120 ug/kg/min via INTRAVENOUS

## 2020-06-13 MED ORDER — SODIUM CHLORIDE 0.9 % IV SOLN: INTRAVENOUS | Status: DC

## 2020-06-13 MED ORDER — PROPOFOL 10 MG/ML IV BOLUS
INTRAVENOUS | Status: DC | PRN
  Administered 2020-06-13: 80 mg via INTRAVENOUS

## 2020-06-13 MED ORDER — PROPOFOL 500 MG/50ML IV EMUL
INTRAVENOUS | Status: AC
  Filled 2020-06-13: qty 50

## 2020-06-13 MED ORDER — LIDOCAINE HCL (CARDIAC) PF 100 MG/5ML IV SOSY
PREFILLED_SYRINGE | INTRAVENOUS | Status: DC | PRN
  Administered 2020-06-13: 50 mg via INTRAVENOUS

## 2020-06-13 NOTE — Interval H&P Note (Signed)
History and Physical Interval Note:  06/13/2020 9:48 AM  Justin [name]  has presented today for surgery, with the diagnosis of PERSONAL HX.OF COLON POLYPS.  The various methods of treatment have been discussed with the patient and family. After consideration of risks, benefits and other options for treatment, the patient has consented to  Procedure(s): COLONOSCOPY (N/A) as a surgical intervention.  The patient's history has been reviewed, patient examined, no change in status, stable for surgery.  I have reviewed the patient's chart and labs.  Questions were answered to the patient's satisfaction.     [place], [place]

## 2020-06-13 NOTE — H&P (Signed)
Outpatient short stay form Pre-procedure 06/13/2020 9:47 AM Justin [name] K. Justin [name], M.D.  Primary Physician: Justin [name], M.D.  Reason for visit: Personal history of multiple adenomatous colon polyps (2018).  History of present illness:                            Patient presents for colonoscopy for a personal hx of colon polyps. The patient denies abdominal pain, abnormal weight loss or rectal bleeding.     No current facility-administered medications for this encounter.  Facility-Administered Medications Ordered in Other Encounters:    furosemide (LASIX) injection 80 mg, 80 mg, Intravenous, Once, Fath, Justin Docker, MD   potassium chloride (KLOR-CON) CR tablet 40 mEq, 40 mEq, Oral, Daily, Fath, Justin Docker, MD  Medications Prior to Admission  Medication Sig Dispense Refill Last Dose   atorvastatin (LIPITOR) 80 MG tablet Take 40 mg by mouth at bedtime.    06/12/2020 at Unknown time   Calcium Carbonate-Vitamin D (CALCIUM 600+D) 600-400 MG-UNIT tablet Take 1 tablet by mouth 2 (two) times daily.   06/12/2020 at Unknown time   ciclopirox (LOPROX) 0.77 % cream Apply topically 2 (two) times daily. Twice daily to feet, in between toes twice daily for 3 weeks then once weekly for maintenance. 90 g 2 06/11/2020 at Unknown time   cyanocobalamin 1000 MCG tablet Take 1,000 mcg by mouth at bedtime.    06/12/2020 at Unknown time   fexofenadine (ALLEGRA) 180 MG tablet Take 180 mg by mouth daily before breakfast.    06/12/2020 at Unknown time   fluticasone (FLONASE) 50 MCG/ACT nasal spray Place 2 sprays into both nostrils at bedtime.   06/11/2020 at Unknown time   gabapentin (NEURONTIN) 300 MG capsule Take 300 mg by mouth at bedtime.    06/12/2020 at Unknown time   ibuprofen (ADVIL) 200 MG tablet Take 400 mg by mouth 3 (three) times daily.   06/12/2020 at Unknown time   isosorbide mononitrate (IMDUR) 60 MG 24 hr tablet Take 60 mg by mouth daily.   06/13/2020 at 0500   metoprolol tartrate (LOPRESSOR) 25 MG  tablet Take 25 mg by mouth 2 (two) times daily.    06/13/2020 at 0500   Multiple Vitamins-Minerals (EMERGEN-C VITAMIN C) PACK Take 1 packet by mouth daily.   Past Week at Unknown time   niacin 500 MG tablet Take 500 mg by mouth 3 (three) times daily.    06/11/2020 at Unknown time   omeprazole (PRILOSEC) 20 MG capsule Take 20 mg by mouth 2 (two) times daily before a meal.   06/13/2020 at Unknown time   potassium chloride SA (K-DUR,KLOR-CON) 20 MEQ tablet Take 20 mEq by mouth 2 (two) times daily.    06/12/2020 at Unknown time   senna-docusate (SENOKOT-S) 8.6-50 MG tablet Take 2 tablets by mouth 2 (two) times daily.   06/12/2020 at Unknown time   Tiotropium Bromide-Olodaterol 2.5-2.5 MCG/ACT AERS Inhale 2 puffs into the lungs daily.   06/13/2020 at Unknown time   torsemide (DEMADEX) 20 MG tablet Take 60 mg by mouth daily.    06/12/2020 at Unknown time   traMADol (ULTRAM) 50 MG tablet Take 50 mg by mouth 2 (two) times daily.   06/13/2020 at Unknown time   acetaminophen (TYLENOL) 500 MG tablet Take 1,000 mg by mouth in the morning and at bedtime.      albuterol (PROVENTIL HFA;VENTOLIN HFA) 108 (90 Base) MCG/ACT inhaler Inhale 2 puffs into the lungs every 4 (four)  hours as needed for wheezing or shortness of breath.   06/11/2020   aspirin EC 81 MG tablet Take 81 mg by mouth daily.   06/09/2020   cholecalciferol (VITAMIN D) 1000 units tablet Take 3,000 Units by mouth daily.    06/11/2020   HYDROcodone-acetaminophen (NORCO/VICODIN) 5-325 MG tablet Take 1 tablet by mouth every 6 (six) hours as needed for pain. (Patient not taking: Reported on 06/13/2020)   Completed Course at Unknown time   hydrocortisone (ANUSOL-HC) 25 MG suppository Place 25 mg rectally 2 (two) times daily as needed for hemorrhoids or anal itching.       Magnesium 400 MG CAPS Take 400 mg by mouth 2 (two) times daily.    06/11/2020   nitroGLYCERIN (NITROSTAT) 0.4 MG SL tablet Place 0.4 mg under the tongue every 5 (five) minutes x 3 doses  as needed for chest pain. If no relief call MD or go to emergency room. (Patient not taking: Reported on 05/15/2020)      Omega-3 Fatty Acids (FISH OIL) 1000 MG CAPS Take 1,000 mg by mouth 2 (two) times daily.   06/11/2020   Probiotic CAPS Take 2 capsules by mouth at bedtime.   06/11/2020   TART CHERRY PO Take 1 capsule by mouth daily.   06/11/2020   Wheat Dextrin (BENEFIBER DRINK MIX PO) Take 1 Dose by mouth daily.    06/11/2020     Allergies  Allergen Reactions   Hydrochlorothiazide W-Triamterene Nausea Only     Past Medical History:  Diagnosis Date   Adenomatous polyps    Anginal pain (HCC)    Arthritis    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   BPH (benign prostatic hyperplasia)    CAD (coronary artery disease)    Cataract    Chronic kidney disease    COPD (chronic obstructive pulmonary disease) (HCC)    DJD (degenerative joint disease)    Does use hearing aid    bilateral   Dyspnea    Dysrhythmia    GERD (gastroesophageal reflux disease)    History of kidney stones    History of open heart surgery    Hypertension    Myocardial infarction [facility])    Osteoporosis    Prostate cancer ([place])    Skin cancer    Sleep apnea    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    Physical Exam  Gen: Alert, oriented. Appears stated age.  HEENT: [place]/AT. PERRLA. Lungs: CTA, no wheezes. CV: RR nl S1, S2. Abd: soft, benign, no masses. BS+ Ext: No edema. Pulses 2+    Planned procedures: Proceed with colonoscopy. The patient understands the nature of the planned procedure, indications, risks, alternatives and potential complications including but not limited to bleeding, infection, perforation, damage to internal organs and possible oversedation/side effects from anesthesia. The patient agrees and gives consent to proceed.  Please refer to  procedure notes for findings, recommendations and patient disposition/instructions.     Justin [name] K. Justin [name], M.D. Gastroenterology 06/13/2020  9:47 AM

## 2020-06-13 NOTE — Transfer of Care (Signed)
Immediate Anesthesia Transfer of Care Note  Patient: Justin [name]  Procedure(s) Performed: COLONOSCOPY (N/A )  Patient Location: PACU and Endoscopy Unit  Anesthesia Type:General  Level of Consciousness: drowsy  Airway & Oxygen Therapy: Patient Spontanous Breathing  Post-op Assessment: Report given to RN and Post -op Vital signs reviewed and stable  Post vital signs: Reviewed and stable  Last Vitals:  Vitals Value Taken Time  BP 97/66 06/13/20 1102  Temp    Pulse 71 06/13/20 1103  Resp 14 06/13/20 1103  SpO2 96 % 06/13/20 1103  Vitals shown include unvalidated device data.  Last Pain:  Vitals:   06/13/20 0945  TempSrc: Temporal  PainSc: 0-No pain         Complications: No complications documented.

## 2020-06-13 NOTE — Op Note (Signed)
[facility] Gastroenterology Patient Name: Justin [name] Procedure Date: 06/13/2020 10:25 AM MRN: [id number] Account #: [id number] Date of Birth: August 15, 1941 Admit Type: Outpatient Age: 79 Room: [facility] ENDO ROOM 3 Gender: Male Note Status: Finalized Procedure:             Colonoscopy Indications:           High risk colon cancer surveillance: Personal history                         of multiple (3 or more) adenomas Providers:             Benay Pike. Roarke Marciano MD, MD Medicines:             Propofol per Anesthesia Complications:         No immediate complications. Estimated blood loss:                         Minimal. Procedure:             Pre-Anesthesia Assessment:                        - The risks and benefits of the procedure and the                         sedation options and risks were discussed with the                         patient. All questions were answered and informed                         consent was obtained.                        - Patient identification and proposed procedure were                         verified prior to the procedure by the nurse. The                         procedure was verified in the procedure room.                        - ASA Grade Assessment: III - A patient with severe                         systemic disease.                        - After reviewing the risks and benefits, the patient                         was deemed in satisfactory condition to undergo the                         procedure.                        After obtaining informed consent, the colonoscope was  passed under direct vision. Throughout the procedure,                         the patient's blood pressure, pulse, and oxygen                         saturations were monitored continuously. The                         Colonoscope was introduced through the anus and                         advanced to the the cecum, identified by  appendiceal                         orifice and ileocecal valve. The colonoscopy was                         performed without difficulty. The patient tolerated                         the procedure well. The quality of the bowel                         preparation was adequate. The ileocecal valve,                         appendiceal orifice, and rectum were photographed. Findings:      The perianal and digital rectal examinations were normal. Pertinent       negatives include normal sphincter tone and no palpable rectal lesions.      Many small and large-mouthed diverticula were found in the sigmoid colon       and descending colon.      A diffuse area of moderate melanosis was found in the entire colon.      Five sessile polyps were found in the transverse colon, ascending colon       and cecum. The polyps were 3 to 4 mm in size. These polyps were removed       with a jumbo cold forceps. Resection and retrieval were complete.      A 10 mm polyp was found in the ascending colon. The polyp was sessile.       The polyp was removed with a hot snare. Resection and retrieval were       complete.      Non-bleeding internal hemorrhoids were found during retroflexion. The       hemorrhoids were Grade I (internal hemorrhoids that do not prolapse). Impression:            - Diverticulosis in the sigmoid colon and in the                         descending colon.                        - Melanosis in the colon.                        - Five 3 to 4 mm polyps in the transverse colon, in  the ascending colon and in the cecum, removed with a                         jumbo cold forceps. Resected and retrieved.                        - One 10 mm polyp in the ascending colon, removed with                         a hot snare. Resected and retrieved.                        - Non-bleeding internal hemorrhoids. Recommendation:        - Patient has a contact number available for                          emergencies. The signs and symptoms of potential                         delayed complications were discussed with the patient.                         Return to normal activities tomorrow. Written                         discharge instructions were provided to the patient.                        - Resume previous diet.                        - Continue present medications.                        - Await pathology results.                        - If polyps are benign or adenomatous without                         dysplasia, I will advise NO further colonoscopy due to                         advanced age and/or severe comorbidity.                        - Return to GI office PRN.                        - The findings and recommendations were discussed with                         the patient. Procedure Code(s):     --- Professional ---                        [id number], Colonoscopy, flexible; with removal of                         tumor(s), polyp(s), or other lesion(s) by snare  technique                        45380, 59, Colonoscopy, flexible; with biopsy, single                         or multiple Diagnosis Code(s):     --- Professional ---                        K57.30, Diverticulosis of large intestine without                         perforation or abscess without bleeding                        K63.89, Other specified diseases of intestine                        K64.0, First degree hemorrhoids                        Z86.010, Personal history of colonic polyps                        K63.5, Polyp of colon CPT copyright 2019 American Medical Association. All rights reserved. The codes documented in this report are preliminary and upon coder review may  be revised to meet current compliance requirements. Efrain Sella MD, MD 06/13/2020 11:06:03 AM This report has been signed electronically. Number of Addenda: 0 Note Initiated On: 06/13/2020 10:25  AM Scope Withdrawal Time: 0 hours 11 minutes 29 seconds  Total Procedure Duration: 0 hours 16 minutes 57 seconds  Estimated Blood Loss:  Estimated blood loss was minimal.      [facility]

## 2020-06-13 NOTE — Anesthesia Preprocedure Evaluation (Addendum)
Anesthesia Evaluation  Patient identified by MRN, date of birth, ID band Patient awake    Reviewed: Allergy & Precautions, H&P , NPO status , Patient's Chart, lab work & pertinent test results  History of Anesthesia Complications Negative for: history of anesthetic complications  Airway Mallampati: II  TM Distance: >3 FB     Dental  (+) Missing   Pulmonary shortness of breath, sleep apnea and Continuous Positive Airway Pressure Ventilation , COPD,  COPD inhaler, former smoker,     + decreased breath sounds      Cardiovascular hypertension, + CAD, + Past MI and + Peripheral Vascular Disease  (-) Cardiac Stents (-) dysrhythmias  Rhythm:regular Rate:Normal     Neuro/Psych negative neurological ROS  negative psych ROS   GI/Hepatic Neg liver ROS, GERD  Controlled,  Endo/Other  negative endocrine ROS  Renal/GU Renal disease (CKD)  negative genitourinary   Musculoskeletal   Abdominal   Peds  Hematology negative hematology ROS (+)   Anesthesia Other Findings Past Medical History: No date: Adenomatous polyps No date: Anginal pain (HCC) No date: Arthritis 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 No date: BPH (benign prostatic hyperplasia) No date: CAD (coronary artery disease) No date: Cataract No date: Chronic kidney disease No date: COPD (chronic obstructive pulmonary disease) (HCC) No date: DJD (degenerative joint disease) No date: Does use hearing aid     Comment:  bilateral No date: Dyspnea No date: Dysrhythmia No date: GERD (gastroesophageal reflux disease) No date: History of kidney stones No date: History of open heart surgery No date: Hypertension No date: Myocardial infarction ([place]) No date: Osteoporosis No date: Prostate cancer ([place]) No date: Skin cancer No date: Sleep apnea 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 neck. SCCis No date: Wears dentures      Comment:  partial upper and lower  Past Surgical History: No date: BACK SURGERY 11/07/2015: CARDIAC CATHETERIZATION; N/A     Comment:  Procedure: Left Heart Cath and Coronary Angiography;                Surgeon: Teodoro Spray, MD;  Location: [place] [facility]               LAB;  Service: Cardiovascular;  Laterality: N/A; 11/07/2015: CARDIAC CATHETERIZATION; N/A     Comment:  Procedure: Coronary Stent Intervention;  Surgeon:               Isaias Cowman, MD;  Location: [place] [facility] LAB;              Service: Cardiovascular;  Laterality: N/A; No date: CATARACT EXTRACTION No date: CHOLECYSTECTOMY 03/27/2017: COLONOSCOPY WITH PROPOFOL; N/A     Comment:  Procedure: COLONOSCOPY WITH PROPOFOL;  Surgeon: Manya Silvas, MD;  Location: [facility] ENDOSCOPY;  Service:               Endoscopy;  Laterality: N/A; No date: CORONARY ANGIOPLASTY No date: CORONARY ARTERY BYPASS GRAFT 03/27/2017: ESOPHAGOGASTRODUODENOSCOPY (EGD) WITH PROPOFOL; N/A     Comment:  Procedure: ESOPHAGOGASTRODUODENOSCOPY (EGD) WITH               PROPOFOL;  Surgeon: Manya Silvas, MD;  Location:               [facility] ENDOSCOPY;  Service: Endoscopy;  Laterality: N/A; 09/10/2017: EXCISION PARTIAL PHALANX; Right     Comment:  Procedure: EXCISION PARTIAL PHALANX-2nd toe;  Surgeon:               Albertine Patricia, DPM;  Location: [place];                Service: Podiatry;  Laterality: Right;  sleep apnea No date: EYE SURGERY No date: HERNIA REMOVED No date: HERNIA REPAIR 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: KYPHOPLASTY; N/A     Comment:  Procedure: KYPHOPLASTY L1;  Surgeon: Hessie Knows, MD;               Location: [facility] ORS;  Service: Orthopedics;  Laterality:               N/A; 05/15/2020: KYPHOPLASTY; N/A     Comment:  Procedure: T 12 Kyphoplasty;  Surgeon: Hessie Knows,               MD;  Location: [facility] ORS;  Service: Orthopedics;                Laterality: N/A; No date: kyphoplasty t10  t11 09/28/2018: LEFT HEART CATH AND CORONARY ANGIOGRAPHY; Left     Comment:  Procedure: LEFT HEART CATH AND CORONARY ANGIOGRAPHY;                Surgeon: Teodoro Spray, MD;  Location: [place] [facility]              LAB;  Service: Cardiovascular;  Laterality: Left; No date: OPEN HEART SURGERY No date: RETROPUBIC PROSTATECTOMY No date: SKIN CANCER REMOVED  BMI    Body Mass Index: 29.83 kg/m      Reproductive/Obstetrics negative OB ROS                            Anesthesia Physical Anesthesia Plan  ASA: III  Anesthesia Plan: General   Post-op Pain Management:    Induction:   PONV Risk Score and Plan: Propofol infusion and TIVA  Airway Management Planned:   Additional Equipment:   Intra-op Plan:   Post-operative Plan:   Informed Consent: I have reviewed the patients History and Physical, chart, labs and discussed the procedure including the risks, benefits and alternatives for the proposed anesthesia with the patient or authorized representative who has indicated his/her understanding and acceptance.     Dental Advisory Given  Plan Discussed with: Anesthesiologist, CRNA and Surgeon  Anesthesia Plan Comments:         Anesthesia Quick Evaluation

## 2020-06-14 ENCOUNTER — Encounter: Payer: Self-pay | Admitting: Internal Medicine

## 2020-06-14 LAB — SURGICAL PATHOLOGY

## 2020-06-14 NOTE — Anesthesia Postprocedure Evaluation (Signed)
Anesthesia Post Note  Patient: Justin [name]  Procedure(s) Performed: COLONOSCOPY (N/A )  Patient location during evaluation: PACU Anesthesia Type: General Level of consciousness: awake and alert Pain management: pain level controlled Vital Signs Assessment: post-procedure vital signs reviewed and stable Respiratory status: spontaneous breathing, nonlabored ventilation and respiratory function stable Cardiovascular status: blood pressure returned to baseline and stable Postop Assessment: no apparent nausea or vomiting Anesthetic complications: no   No complications documented.   Last Vitals:  Vitals:   06/13/20 1123 06/13/20 1133  BP: 99/80 108/76  Pulse: 75 73  Resp: (!) 21 19  Temp:    SpO2: 96% 95%    Last Pain:  Vitals:   06/14/20 0745  TempSrc:   PainSc: 0-No pain                 Justin [name] Justin [name]

## 2020-07-25 ENCOUNTER — Other Ambulatory Visit: Payer: Self-pay

## 2020-07-25 ENCOUNTER — Ambulatory Visit (INDEPENDENT_AMBULATORY_CARE_PROVIDER_SITE_OTHER): Payer: Medicare Other | Admitting: Dermatology

## 2020-07-25 DIAGNOSIS — Z86007 Personal history of in-situ neoplasm of skin: Secondary | ICD-10-CM | POA: Diagnosis not present

## 2020-07-25 DIAGNOSIS — L570 Actinic keratosis: Secondary | ICD-10-CM | POA: Diagnosis not present

## 2020-07-25 DIAGNOSIS — L578 Other skin changes due to chronic exposure to nonionizing radiation: Secondary | ICD-10-CM | POA: Diagnosis not present

## 2020-07-25 NOTE — Patient Instructions (Signed)
Cryotherapy Aftercare  . Wash gently with soap and water everyday.   . Apply Vaseline and Band-Aid daily until healed.  Prior to procedure, discussed risks of blister formation, small wound, skin dyspigmentation, or rare scar following cryotherapy.   

## 2020-07-25 NOTE — Progress Notes (Signed)
   Follow-Up Visit   Subjective  Justin [name] is a 79 y.o. male who presents for the following: Follow-up (Patient here today for 3 month AK follow up. AK's treated at last visit were at scalp, face and hands. ).  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 Nothing new or changing today that patient is aware of.   The following portions of the chart were reviewed this encounter and updated as appropriate:  Tobacco  Allergies  Meds  Problems  Med Hx  Surg Hx  Fam Hx      Review of Systems:  No other skin or systemic complaints except as noted in HPI or Assessment and Plan.  Objective  Well appearing patient in no apparent distress; mood and affect are within normal limits.  A focused examination was performed including face, ears, neck, scalp, hands. Relevant physical exam findings are noted in the Assessment and Plan.  Objective  R frontal scalp x 1, R vertex scalp x 3, L preauricular x 1, L cheek x 1, L dorsal hand x 1 (7): Erythematous thin papules/macules with gritty scale.    Assessment & Plan  AK (actinic keratosis) (7) R frontal scalp x 1, R vertex scalp x 3, L preauricular x 1, L cheek x 1, L dorsal hand x 1  Prior to procedure, discussed risks of blister formation, small wound, skin dyspigmentation, or rare scar following cryotherapy.    Destruction of lesion - R frontal scalp x 1, R vertex scalp x 3, L preauricular x 1, L cheek x 1, L dorsal hand x 1  Destruction method: cryotherapy   Informed consent: discussed and consent obtained   Lesion destroyed using liquid nitrogen: Yes   Cryotherapy cycles:  2 Outcome: patient tolerated procedure well with no complications   Post-procedure details: wound care instructions given     History of Squamous Cell Carcinoma in Situ of the Skin - No evidence of recurrence today at right preauricular - Recommend regular full body skin exams - Recommend  daily broad spectrum sunscreen SPF 30+ to sun-exposed areas, reapply every 2 hours as needed.  - Call if any new or changing lesions are noted between office visits  Actinic Damage - chronic, secondary to cumulative UV radiation exposure/sun exposure over time - diffuse scaly erythematous macules with underlying dyspigmentation - Recommend daily broad spectrum sunscreen SPF 30+ to sun-exposed areas, reapply every 2 hours as needed.  - Call for new or changing lesions.  Return in about 3 months (around 10/25/2020) for TBSE.  Graciella Belton, RMA, am acting as scribe for Forest Gleason, MD .  Documentation: I have reviewed the above documentation for accuracy and completeness, and I agree with the above.  Forest Gleason, MD

## 2020-10-05 ENCOUNTER — Other Ambulatory Visit: Payer: Self-pay | Admitting: Gastroenterology

## 2020-10-05 DIAGNOSIS — K219 Gastro-esophageal reflux disease without esophagitis: Secondary | ICD-10-CM

## 2020-10-05 DIAGNOSIS — R1084 Generalized abdominal pain: Secondary | ICD-10-CM

## 2020-10-12 ENCOUNTER — Other Ambulatory Visit: Payer: Self-pay

## 2020-10-12 ENCOUNTER — Ambulatory Visit
Admission: RE | Admit: 2020-10-12 | Discharge: 2020-10-12 | Disposition: A | Discharge status: Home / Self Care | Payer: Medicare Other | Source: Ambulatory Visit | Attending: Gastroenterology | Admitting: Gastroenterology

## 2020-10-12 DIAGNOSIS — R1084 Generalized abdominal pain: Secondary | ICD-10-CM | POA: Diagnosis present

## 2020-10-12 DIAGNOSIS — K219 Gastro-esophageal reflux disease without esophagitis: Secondary | ICD-10-CM | POA: Diagnosis not present

## 2020-11-28 ENCOUNTER — Encounter: Payer: Medicare Other | Admitting: Dermatology

## 2021-01-10 ENCOUNTER — Ambulatory Visit
Admission: RE | Admit: 2021-01-10 | Discharge: 2021-01-10 | Disposition: A | Discharge status: Home / Self Care | Payer: Medicare Other | Source: Ambulatory Visit | Attending: Gastroenterology | Admitting: Gastroenterology

## 2021-01-10 ENCOUNTER — Other Ambulatory Visit: Payer: Self-pay | Admitting: Gastroenterology

## 2021-01-10 ENCOUNTER — Other Ambulatory Visit: Payer: Self-pay

## 2021-01-10 DIAGNOSIS — R1013 Epigastric pain: Secondary | ICD-10-CM

## 2021-01-10 DIAGNOSIS — R112 Nausea with vomiting, unspecified: Secondary | ICD-10-CM | POA: Diagnosis present

## 2021-01-10 DIAGNOSIS — R634 Abnormal weight loss: Secondary | ICD-10-CM

## 2021-01-10 MED ORDER — IOHEXOL 300 MG/ML  SOLN
100.0000 mL | Freq: Once | INTRAMUSCULAR | Status: AC | PRN
  Administered 2021-01-10: 100 mL via INTRAVENOUS

## 2021-01-14 ENCOUNTER — Other Ambulatory Visit: Payer: Self-pay

## 2021-01-14 ENCOUNTER — Encounter
Admission: RE | Disposition: A | Discharge status: Home / Self Care | Payer: Self-pay | Source: Home / Self Care | Attending: Internal Medicine

## 2021-01-14 ENCOUNTER — Ambulatory Visit: Payer: Medicare Other | Admitting: Anesthesiology

## 2021-01-14 ENCOUNTER — Ambulatory Visit
Admission: RE | Admit: 2021-01-14 | Discharge: 2021-01-14 | Disposition: A | Discharge status: Home / Self Care | Payer: Medicare Other | Attending: Internal Medicine | Admitting: Internal Medicine

## 2021-01-14 ENCOUNTER — Encounter: Payer: Self-pay | Admitting: Internal Medicine

## 2021-01-14 DIAGNOSIS — R634 Abnormal weight loss: Secondary | ICD-10-CM | POA: Diagnosis not present

## 2021-01-14 DIAGNOSIS — R1011 Right upper quadrant pain: Secondary | ICD-10-CM | POA: Insufficient documentation

## 2021-01-14 DIAGNOSIS — Z888 Allergy status to other drugs, medicaments and biological substances status: Secondary | ICD-10-CM | POA: Diagnosis not present

## 2021-01-14 DIAGNOSIS — Z7982 Long term (current) use of aspirin: Secondary | ICD-10-CM | POA: Insufficient documentation

## 2021-01-14 DIAGNOSIS — K319 Disease of stomach and duodenum, unspecified: Secondary | ICD-10-CM | POA: Insufficient documentation

## 2021-01-14 DIAGNOSIS — Z791 Long term (current) use of non-steroidal anti-inflammatories (NSAID): Secondary | ICD-10-CM | POA: Insufficient documentation

## 2021-01-14 DIAGNOSIS — K2970 Gastritis, unspecified, without bleeding: Secondary | ICD-10-CM | POA: Diagnosis not present

## 2021-01-14 DIAGNOSIS — K2289 Other specified disease of esophagus: Secondary | ICD-10-CM | POA: Diagnosis not present

## 2021-01-14 DIAGNOSIS — R112 Nausea with vomiting, unspecified: Secondary | ICD-10-CM | POA: Diagnosis not present

## 2021-01-14 DIAGNOSIS — K219 Gastro-esophageal reflux disease without esophagitis: Secondary | ICD-10-CM | POA: Diagnosis not present

## 2021-01-14 DIAGNOSIS — K449 Diaphragmatic hernia without obstruction or gangrene: Secondary | ICD-10-CM | POA: Diagnosis not present

## 2021-01-14 DIAGNOSIS — Z79891 Long term (current) use of opiate analgesic: Secondary | ICD-10-CM | POA: Diagnosis not present

## 2021-01-14 DIAGNOSIS — Z79899 Other long term (current) drug therapy: Secondary | ICD-10-CM | POA: Diagnosis not present

## 2021-01-14 HISTORY — PX: ESOPHAGOGASTRODUODENOSCOPY: SHX5428

## 2021-01-14 SURGERY — ESOPHAGOGASTRODUODENOSCOPY (EGD) WITH PROPOFOL
Anesthesia: General

## 2021-01-14 SURGERY — EGD (ESOPHAGOGASTRODUODENOSCOPY)
Anesthesia: General

## 2021-01-14 MED ORDER — LIDOCAINE HCL (CARDIAC) PF 100 MG/5ML IV SOSY
PREFILLED_SYRINGE | INTRAVENOUS | Status: DC | PRN
  Administered 2021-01-14: 20 mg via INTRAVENOUS

## 2021-01-14 MED ORDER — PROPOFOL 500 MG/50ML IV EMUL
INTRAVENOUS | Status: AC
  Filled 2021-01-14: qty 50

## 2021-01-14 MED ORDER — SODIUM CHLORIDE 0.9 % IV SOLN: INTRAVENOUS | Status: DC

## 2021-01-14 MED ORDER — PHENYLEPHRINE HCL (PRESSORS) 10 MG/ML IV SOLN
INTRAVENOUS | Status: DC | PRN
  Administered 2021-01-14 (×2): 100 ug via INTRAVENOUS

## 2021-01-14 MED ORDER — PROPOFOL 500 MG/50ML IV EMUL
INTRAVENOUS | Status: DC | PRN
  Administered 2021-01-14: 135 ug/kg/min via INTRAVENOUS

## 2021-01-14 MED ORDER — GLYCOPYRROLATE 0.2 MG/ML IJ SOLN
INTRAMUSCULAR | Status: DC | PRN
  Administered 2021-01-14: .2 mg via INTRAVENOUS

## 2021-01-14 MED ORDER — PROPOFOL 10 MG/ML IV BOLUS
INTRAVENOUS | Status: DC | PRN
  Administered 2021-01-14: 100 mg via INTRAVENOUS

## 2021-01-14 MED ORDER — LIDOCAINE HCL (PF) 2 % IJ SOLN
INTRAMUSCULAR | Status: AC
  Filled 2021-01-14: qty 5

## 2021-01-14 NOTE — Op Note (Signed)
[facility] Gastroenterology Patient Name: Justin [name] Procedure Date: 01/14/2021 2:33 PM MRN: [id number] Account #: [id number] Date of Birth: 01-Aug-1941 Admit Type: Outpatient Age: 80 Room: [facility] ENDO ROOM 2 Gender: Male Note Status: Finalized Procedure:             Upper GI endoscopy Indications:           Abdominal pain in the right upper quadrant,                         Gastro-esophageal reflux disease, Nausea with                         vomiting, Weight loss Providers:             Benay Pike. Alice Reichert MD, MD Referring MD:          Rusty Aus, MD (Referring MD) Medicines:             Propofol per Anesthesia Complications:         No immediate complications. Procedure:             Pre-Anesthesia Assessment:                        - The risks and benefits of the procedure and the                         sedation options and risks were discussed with the                         patient. All questions were answered and informed                         consent was obtained.                        - Patient identification and proposed procedure were                         verified prior to the procedure by the nurse. The                         procedure was verified in the procedure room.                        - ASA Grade Assessment: III - A patient with severe                         systemic disease.                        - After reviewing the risks and benefits, the patient                         was deemed in satisfactory condition to undergo the                         procedure.                        After obtaining informed consent, the endoscope  was                         passed under direct vision. Throughout the procedure,                         the patient's blood pressure, pulse, and oxygen                         saturations were monitored continuously. The Endoscope                         was introduced through the mouth, and advanced to  the                         third part of duodenum. The upper GI endoscopy was                         accomplished without difficulty. The patient tolerated                         the procedure well. Findings:      Moderate tortuosity of the mid to distal esophagus was noted compatible       with a diagnosis of Presbyesophagus.      A medium-sized hiatal hernia was present.      Scattered moderate inflammation characterized by erythema and linear       erosions was found in the gastric body and in the gastric antrum.       Biopsies were taken with a cold forceps for Helicobacter pylori testing.       Verification of patient identification for the specimen was done by the       nurse.      The examined duodenum was normal.      The exam was otherwise without abnormality. Impression:            - Medium-sized hiatal hernia.                        - Gastritis. Biopsied.                        - Normal examined duodenum.                        - The examination was otherwise normal. Recommendation:        - Patient has a contact number available for                         emergencies. The signs and symptoms of potential                         delayed complications were discussed with the patient.                         Return to normal activities tomorrow. Written                         discharge instructions were provided to the patient.                        -  Resume previous diet.                        - Continue present medications.                        - Await pathology results.                        - CT angiogram r/o mesenteric ischemia. Dx: abdominal                         pain, food fear, unexplained weight loss.                        - Return to physician assistant in 4 weeks.                        - Follow up with [name], PA-C in [ ]  months.                        - The findings and recommendations were discussed with                         the  patient. Procedure Code(s):     --- Professional ---                        [phone], Esophagogastroduodenoscopy, flexible,                         transoral; with biopsy, single or multiple Diagnosis Code(s):     --- Professional ---                        R63.4, Abnormal weight loss                        R11.2, Nausea with vomiting, unspecified                        K21.9, Gastro-esophageal reflux disease without                         esophagitis                        R10.11, Right upper quadrant pain                        K29.70, Gastritis, unspecified, without bleeding                        K44.9, Diaphragmatic hernia without obstruction or                         gangrene CPT copyright 2019 American Medical Association. All rights reserved. The codes documented in this report are preliminary and upon coder review may  be revised to meet current compliance requirements. Efrain Sella MD, MD 01/14/2021 2:58:26 PM This report has been signed electronically. Number of Addenda: 0 Note Initiated On: 01/14/2021 2:33 PM Estimated Blood Loss:  Estimated blood loss: none.  [facility]

## 2021-01-14 NOTE — Interval H&P Note (Signed)
History and Physical Interval Note:  01/14/2021 2:45 PM  Justin [name]  has presented today for surgery, with the diagnosis of GERD.  The various methods of treatment have been discussed with the patient and family. After consideration of risks, benefits and other options for treatment, the patient has consented to  Procedure(s): ESOPHAGOGASTRODUODENOSCOPY (EGD) (N/A) as a surgical intervention.  The patient's history has been reviewed, patient examined, no change in status, stable for surgery.  I have reviewed the patient's chart and labs.  Questions were answered to the patient's satisfaction.     [place], [employer]

## 2021-01-14 NOTE — H&P (Signed)
Outpatient short stay form Pre-procedure 01/14/2021 2:34 PM Justin [name] K. Alice Reichert, M.D.  Primary Physician: [name], M.D.  Reason for visit:  RUQ pain, N/V  History of present illness:  Patient with RUQ pain, 20 lb unintentional weight loss has several nausea and vomiting episodes predated by epigastric pain. CT scan of the abdomen showed moderate hiatal hernia, left renal calculus, umbilical hernias. Patient has a hx of cholecystectomy.    Current Facility-Administered Medications:  .  0.9 %  sodium chloride infusion, , Intravenous, Continuous, [place], Benay Pike, MD, Last Rate: 20 mL/hr at 01/14/21 1401, Continued from Pre-op at 01/14/21 1401  Facility-Administered Medications Ordered in Other Encounters:  .  furosemide (LASIX) injection 80 mg, 80 mg, Intravenous, Once, Zhara Gieske Spray, MD .  potassium chloride (KLOR-CON) CR tablet 40 mEq, 40 mEq, Oral, Daily, Fath, Javier Docker, MD  Medications Prior to Admission  Medication Sig Dispense Refill Last Dose  . acetaminophen (TYLENOL) 500 MG tablet Take 1,000 mg by mouth in the morning and at bedtime.   01/13/2021 at Unknown time  . albuterol (PROVENTIL HFA;VENTOLIN HFA) 108 (90 Base) MCG/ACT inhaler Inhale 2 puffs into the lungs every 4 (four) hours as needed for wheezing or shortness of breath.   01/14/2021 at 0530  . aspirin EC 81 MG tablet Take 81 mg by mouth daily.   Past Week at Unknown time  . atorvastatin (LIPITOR) 80 MG tablet Take 40 mg by mouth at bedtime.    01/13/2021 at Unknown time  . Calcium Carbonate-Vitamin D 600-400 MG-UNIT tablet Take 1 tablet by mouth 2 (two) times daily.   01/13/2021 at Unknown time  . cholecalciferol (VITAMIN D) 1000 units tablet Take 3,000 Units by mouth daily.    01/13/2021 at Unknown time  . ciclopirox (LOPROX) 0.77 % cream Apply topically 2 (two) times daily. Twice daily to feet, in between toes twice daily for 3 weeks then once weekly for maintenance. 90 g 2 Past Week at Unknown time  . cyanocobalamin  1000 MCG tablet Take 1,000 mcg by mouth at bedtime.    01/13/2021 at Unknown time  . fexofenadine (ALLEGRA) 180 MG tablet Take 180 mg by mouth daily before breakfast.    01/13/2021 at Unknown time  . fluticasone (FLONASE) 50 MCG/ACT nasal spray Place 2 sprays into both nostrils at bedtime.   01/13/2021 at Unknown time  . gabapentin (NEURONTIN) 300 MG capsule Take 300 mg by mouth at bedtime.    01/13/2021 at Unknown time  . hydrocortisone (ANUSOL-HC) 25 MG suppository Place 25 mg rectally 2 (two) times daily as needed for hemorrhoids or anal itching.    Past Month at Unknown time  . ibuprofen (ADVIL) 200 MG tablet Take 400 mg by mouth 3 (three) times daily.   01/13/2021 at Unknown time  . isosorbide mononitrate (IMDUR) 60 MG 24 hr tablet Take 60 mg by mouth daily.   01/13/2021 at Unknown time  . Magnesium 400 MG CAPS Take 400 mg by mouth 2 (two) times daily.    01/13/2021 at Unknown time  . metoprolol tartrate (LOPRESSOR) 25 MG tablet Take 25 mg by mouth 2 (two) times daily.    01/14/2021 at 0530  . Multiple Vitamins-Minerals (EMERGEN-C VITAMIN C) PACK Take 1 packet by mouth daily.   Past Week at Unknown time  . niacin 500 MG tablet Take 500 mg by mouth 3 (three) times daily.    01/13/2021 at Unknown time  . nitroGLYCERIN (NITROSTAT) 0.4 MG SL tablet Place 0.4 mg under the  tongue every 5 (five) minutes x 3 doses as needed for chest pain. If no relief call MD or go to emergency room.   Past Month at Unknown time  . Omega-3 Fatty Acids (FISH OIL) 1000 MG CAPS Take 1,000 mg by mouth 2 (two) times daily.   Past Week at Unknown time  . omeprazole (PRILOSEC) 20 MG capsule Take 20 mg by mouth 2 (two) times daily before a meal.   01/13/2021 at Unknown time  . potassium chloride SA (K-DUR,KLOR-CON) 20 MEQ tablet Take 20 mEq by mouth 2 (two) times daily.    01/13/2021 at Unknown time  . Probiotic CAPS Take 2 capsules by mouth at bedtime.   01/13/2021 at Unknown time  . senna-docusate (SENOKOT-S) 8.6-50 MG tablet Take 2  tablets by mouth 2 (two) times daily.   01/13/2021 at Unknown time  . TART CHERRY PO Take 1 capsule by mouth daily.   01/13/2021 at Unknown time  . Tiotropium Bromide-Olodaterol 2.5-2.5 MCG/ACT AERS Inhale 2 puffs into the lungs daily.   Past Week at Unknown time  . torsemide (DEMADEX) 20 MG tablet Take 60 mg by mouth daily.    01/13/2021 at Unknown time  . traMADol (ULTRAM) 50 MG tablet Take 50 mg by mouth 2 (two) times daily.   Past Week at Unknown time  . Wheat Dextrin (BENEFIBER DRINK MIX PO) Take 1 Dose by mouth daily.    01/13/2021 at Unknown time  . HYDROcodone-acetaminophen (NORCO/VICODIN) 5-325 MG tablet Take 1 tablet by mouth every 6 (six) hours as needed for pain. (Patient not taking: Reported on 06/13/2020)        Allergies  Allergen Reactions  . Hydrochlorothiazide W-Triamterene Nausea Only     Past Medical History:  Diagnosis Date  . Adenomatous polyps   . Anginal pain ([place])   . Arthritis   . 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  . BPH (benign prostatic hyperplasia)   . CAD (coronary artery disease)   . Cataract   . Chronic kidney disease   . COPD (chronic obstructive pulmonary disease) ([place])   . DJD (degenerative joint disease)   . Does use hearing aid    bilateral  . Dyspnea   . Dysrhythmia   . GERD (gastroesophageal reflux disease)   . History of kidney stones   . History of open heart surgery   . Hypertension   . Myocardial infarction ([place])   . Osteoporosis   . Prostate cancer ([place])   . Skin cancer   . Sleep apnea   . 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 Wears dentures    partial upper and lower    Review of systems:  Otherwise negative.    Physical Exam  Gen: Alert, oriented. Appears stated age.  HEENT: [place]/AT. PERRLA. Lungs: CTA, no wheezes. CV: RR nl S1, S2. Abd: soft, benign, no masses. BS+ Ext: No edema. Pulses 2+    Planned  procedures: Proceed with EGD. The patient understands the nature of the planned procedure, indications, risks, alternatives and potential complications including but not limited to bleeding, infection, perforation, damage to internal organs and possible oversedation/side effects from anesthesia. The patient agrees and gives consent to proceed.  Please refer to procedure notes for findings, recommendations and patient disposition/instructions.     Shammond Arave K. Alice Reichert, M.D. Gastroenterology 01/14/2021  2:34 PM

## 2021-01-14 NOTE — Anesthesia Postprocedure Evaluation (Signed)
Anesthesia Post Note  Patient: Justin [name]  Procedure(s) Performed: ESOPHAGOGASTRODUODENOSCOPY (EGD) (N/A )  Patient location during evaluation: PACU Anesthesia Type: General Level of consciousness: awake and alert and oriented Pain management: pain level controlled Vital Signs Assessment: post-procedure vital signs reviewed and stable Respiratory status: spontaneous breathing Cardiovascular status: blood pressure returned to baseline Anesthetic complications: no   No complications documented.   Last Vitals:  Vitals:   01/14/21 1459 01/14/21 1506  BP: (!) 109/52 (!) 101/53  Pulse: 64 70  Resp: 15 (!) 28  Temp:    SpO2: 97% 98%    Last Pain:  Vitals:   01/14/21 1506  TempSrc:   PainSc: 0-No pain                 Charis Juliana

## 2021-01-14 NOTE — Transfer of Care (Signed)
Immediate Anesthesia Transfer of Care Note  Patient: [name]  Procedure(s) Performed: ESOPHAGOGASTRODUODENOSCOPY (EGD) (N/A )  Patient Location: Endoscopy Unit  Anesthesia Type:General  Level of Consciousness: awake, drowsy and patient cooperative  Airway & Oxygen Therapy: Patient Spontanous Breathing  Post-op Assessment: Report given to RN and Post -op Vital signs reviewed and stable  Post vital signs: Reviewed and stable  Last Vitals:  Vitals Value Taken Time  BP 98/44 01/14/21 1456  Temp 35.9 C 01/14/21 1456  Pulse 64 01/14/21 1456  Resp 14 01/14/21 1456  SpO2 97 % 01/14/21 1456  Vitals shown include unvalidated device data.  Last Pain:  Vitals:   01/14/21 1456  TempSrc: Temporal  PainSc: Asleep         Complications: No complications documented.

## 2021-01-14 NOTE — Anesthesia Preprocedure Evaluation (Signed)
Anesthesia Evaluation  Patient identified by MRN, date of birth, ID band Patient awake    Reviewed: Allergy & Precautions, H&P , NPO status , Patient's Chart, lab work & pertinent test results  History of Anesthesia Complications Negative for: history of anesthetic complications  Airway Mallampati: II  TM Distance: >3 FB     Dental  (+) Missing   Pulmonary shortness of breath, sleep apnea and Continuous Positive Airway Pressure Ventilation , COPD,  COPD inhaler, former smoker,     + decreased breath sounds      Cardiovascular hypertension, + angina + CAD, + Past MI and + Peripheral Vascular Disease  (-) Cardiac Stents (-) dysrhythmias  Rhythm:regular Rate:Normal     Neuro/Psych negative neurological ROS  negative psych ROS   GI/Hepatic Neg liver ROS, GERD  Controlled,  Endo/Other  negative endocrine ROS  Renal/GU Renal disease (CKD)  negative genitourinary   Musculoskeletal  (+) Arthritis , Osteoarthritis,    Abdominal   Peds negative pediatric ROS (+)  Hematology negative hematology ROS (+)   Anesthesia Other Findings Past Medical History: No date: Adenomatous polyps No date: Anginal pain (HCC) No date: Arthritis 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 No date: BPH (benign prostatic hyperplasia) No date: CAD (coronary artery disease) No date: Cataract No date: Chronic kidney disease No date: COPD (chronic obstructive pulmonary disease) (HCC) No date: DJD (degenerative joint disease) No date: Does use hearing aid     Comment:  bilateral No date: Dyspnea No date: Dysrhythmia No date: GERD (gastroesophageal reflux disease) No date: History of kidney stones No date: History of open heart surgery No date: Hypertension No date: Myocardial infarction ([place]) No date: Osteoporosis No date: Prostate cancer ([place]) No date: Skin cancer No date: Sleep apnea 08/04/2019: Squamous cell carcinoma of  skin     Comment:  Right lat. neck. SCCis No date: Wears dentures     Comment:  partial upper and lower  Past Surgical History: No date: BACK SURGERY 11/07/2015: CARDIAC CATHETERIZATION; N/A     Comment:  Procedure: Left Heart Cath and Coronary Angiography;                Surgeon: Teodoro Spray, MD;  Location: [place] [facility]               LAB;  Service: Cardiovascular;  Laterality: N/A; 11/07/2015: CARDIAC CATHETERIZATION; N/A     Comment:  Procedure: Coronary Stent Intervention;  Surgeon:               Isaias Cowman, MD;  Location: [facility];              Service: Cardiovascular;  Laterality: N/A; No date: CATARACT EXTRACTION No date: CHOLECYSTECTOMY 03/27/2017: COLONOSCOPY WITH PROPOFOL; N/A     Comment:  Procedure: COLONOSCOPY WITH PROPOFOL;  Surgeon: Manya Silvas, MD;  Location: [facility] ENDOSCOPY;  Service:               Endoscopy;  Laterality: N/A; No date: CORONARY ANGIOPLASTY No date: CORONARY ARTERY BYPASS GRAFT 03/27/2017: ESOPHAGOGASTRODUODENOSCOPY (EGD) WITH PROPOFOL; N/A     Comment:  Procedure: ESOPHAGOGASTRODUODENOSCOPY (EGD) WITH               PROPOFOL;  Surgeon: Manya Silvas, MD;  Location:               [facility] ENDOSCOPY;  Service: Endoscopy;  Laterality: N/A; 09/10/2017: EXCISION PARTIAL PHALANX; Right     Comment:  Procedure: EXCISION PARTIAL PHALANX-2nd toe;  Surgeon:               Albertine Patricia, DPM;  Location: [place];                Service: Podiatry;  Laterality: Right;  sleep apnea No date: EYE SURGERY No date: HERNIA REMOVED No date: HERNIA REPAIR 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: KYPHOPLASTY; N/A     Comment:  Procedure: KYPHOPLASTY L1;  Surgeon: Hessie Knows, MD;               Location: [facility] ORS;  Service: Orthopedics;  Laterality:               N/A; 05/15/2020: KYPHOPLASTY; N/A     Comment:  Procedure: T 12 Kyphoplasty;  Surgeon: Hessie Knows,               MD;  Location: [facility] ORS;  Service:  Orthopedics;                Laterality: N/A; No date: kyphoplasty t10 t11 09/28/2018: LEFT HEART CATH AND CORONARY ANGIOGRAPHY; Left     Comment:  Procedure: LEFT HEART CATH AND CORONARY ANGIOGRAPHY;                Surgeon: Teodoro Spray, MD;  Location: [place] [facility]              LAB;  Service: Cardiovascular;  Laterality: Left; No date: OPEN HEART SURGERY No date: RETROPUBIC PROSTATECTOMY No date: SKIN CANCER REMOVED  BMI    Body Mass Index: 29.83 kg/m      Reproductive/Obstetrics negative OB ROS                             Anesthesia Physical  Anesthesia Plan  ASA: III  Anesthesia Plan: General   Post-op Pain Management:    Induction:   PONV Risk Score and Plan: Propofol infusion and TIVA  Airway Management Planned: Nasal Cannula  Additional Equipment:   Intra-op Plan:   Post-operative Plan:   Informed Consent: I have reviewed the patients History and Physical, chart, labs and discussed the procedure including the risks, benefits and alternatives for the proposed anesthesia with the patient or authorized representative who has indicated his/her understanding and acceptance.     Dental Advisory Given and Dental advisory given  Plan Discussed with: Anesthesiologist, CRNA and Surgeon  Anesthesia Plan Comments:         Anesthesia Quick Evaluation

## 2021-01-16 ENCOUNTER — Other Ambulatory Visit: Payer: Self-pay | Admitting: Gastroenterology

## 2021-01-16 ENCOUNTER — Encounter: Payer: Self-pay | Admitting: Internal Medicine

## 2021-01-16 DIAGNOSIS — R1011 Right upper quadrant pain: Secondary | ICD-10-CM

## 2021-01-16 DIAGNOSIS — R634 Abnormal weight loss: Secondary | ICD-10-CM

## 2021-01-16 DIAGNOSIS — R1013 Epigastric pain: Secondary | ICD-10-CM

## 2021-01-16 LAB — SURGICAL PATHOLOGY

## 2021-02-06 ENCOUNTER — Other Ambulatory Visit: Payer: Self-pay

## 2021-02-06 ENCOUNTER — Ambulatory Visit
Admission: RE | Admit: 2021-02-06 | Discharge: 2021-02-06 | Disposition: A | Discharge status: Home / Self Care | Payer: Medicare Other | Source: Ambulatory Visit | Attending: Gastroenterology | Admitting: Gastroenterology

## 2021-02-06 DIAGNOSIS — R1011 Right upper quadrant pain: Secondary | ICD-10-CM | POA: Insufficient documentation

## 2021-02-06 DIAGNOSIS — R634 Abnormal weight loss: Secondary | ICD-10-CM | POA: Diagnosis present

## 2021-02-06 DIAGNOSIS — R1013 Epigastric pain: Secondary | ICD-10-CM | POA: Insufficient documentation

## 2021-02-25 NOTE — Progress Notes (Signed)
02/26/2021 7:34 PM   Justin [name] [birth year] [id number]  Referring provider: Rusty Aus, MD [place] [facility] [place],  [place] [id number]  Chief Complaint  Patient presents with  . New Patient (Initial Visit)    Bladder pain    HPI: 80 year old male with a personal history of prostate cancer who presents today for further evaluation of "bladder pain".  He was previously patient of Dr. Jacqlyn Larsen at [facility] urology from which records are available via [place] today.  Notably, he has a personal history of prostate cancer diagnosed in 2008.  He underwent retropubic prostatectomy.  PSA has remained undetectable as of 07/2020.  He reports pain with bladder filling which is relieved with urination.  This is been going on for several months.  In addition to this, he has had a lot of GI upset which is just below the umbilicus which he has been treated with Carafate.  He believes the 2 issues may be related.  He denies any overt dysuria or gross hematuria.  He has had multiple urinalysis including most recently on 02/01/2021 all of which are negative without microscopic blood or evidence of infection.   Previously wearing 4 ppd, now wearing pull ups daily.  Worse with activity.  Worsening with age.    She also has a known history of kidney stone.  On his most recent CT last month, this measured 1 cm in the left lower pole.  He is no without about this particular stone for quite some time, years and was being followed with serial KUB.  He is elected conservative management.  He is asymptomatic from this.  He does have some midline low back pain unlikely related to the stone.   PMH: Past Medical History:  Diagnosis Date  . Adenomatous polyps   . Anginal pain ([place])   . Arthritis   . 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  . BPH (benign prostatic hyperplasia)   . CAD (coronary artery disease)   . Cataract   . Chronic kidney disease   . COPD (chronic  obstructive pulmonary disease) ([place])   . DJD (degenerative joint disease)   . Does use hearing aid    bilateral  . Dyspnea   . Dysrhythmia   . GERD (gastroesophageal reflux disease)   . History of kidney stones   . History of open heart surgery   . Hypertension   . Myocardial infarction ([place])   . Osteoporosis   . Prostate cancer ([place])   . Skin cancer   . Sleep apnea   . 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 Wears dentures    partial upper and lower    Surgical History: Past Surgical History:  Procedure Laterality Date  . BACK SURGERY    . CARDIAC CATHETERIZATION N/A 11/07/2015   Procedure: Left Heart Cath and Coronary Angiography;  Surgeon: Teodoro Spray, MD;  Location: [place] [facility];  Service: Cardiovascular;  Laterality: N/A;  . CARDIAC CATHETERIZATION N/A 11/07/2015   Procedure: Coronary Stent Intervention;  Surgeon: Isaias Cowman, MD;  Location: [place] [facility];  Service: Cardiovascular;  Laterality: N/A;  . CATARACT EXTRACTION    . CHOLECYSTECTOMY    . COLONOSCOPY N/A 06/13/2020   Procedure: COLONOSCOPY;  Surgeon: Toledo, Benay Pike, MD;  Location: [facility] ENDOSCOPY;  Service: Gastroenterology;  Laterality: N/A;  . COLONOSCOPY WITH  PROPOFOL N/A 03/27/2017   Procedure: COLONOSCOPY WITH PROPOFOL;  Surgeon: Manya Silvas, MD;  Location: [facility] ENDOSCOPY;  Service: Endoscopy;  Laterality: N/A;  . CORONARY ANGIOPLASTY    . CORONARY ARTERY BYPASS GRAFT    . ESOPHAGOGASTRODUODENOSCOPY N/A 01/14/2021   Procedure: ESOPHAGOGASTRODUODENOSCOPY (EGD);  Surgeon: Toledo, Benay Pike, MD;  Location: [facility] ENDOSCOPY;  Service: Gastroenterology;  Laterality: N/A;  . ESOPHAGOGASTRODUODENOSCOPY (EGD) WITH PROPOFOL N/A 03/27/2017   Procedure: ESOPHAGOGASTRODUODENOSCOPY (EGD) WITH PROPOFOL;  Surgeon: Manya Silvas, MD;  Location: [facility];  Service: Endoscopy;   Laterality: N/A;  . EXCISION PARTIAL PHALANX Right 09/10/2017   Procedure: EXCISION PARTIAL PHALANX-2nd toe;  Surgeon: Albertine Patricia, DPM;  Location: [place];  Service: Podiatry;  Laterality: Right;  sleep apnea  . EYE SURGERY    . HERNIA REMOVED    . HERNIA REPAIR    . kissing spine syndrome    . KYPHOPLASTY N/A 03/23/2020   Procedure: KYPHOPLASTY L1;  Surgeon: Hessie Knows, MD;  Location: [facility];  Service: Orthopedics;  Laterality: N/A;  . KYPHOPLASTY N/A 05/15/2020   Procedure: T 12 Kyphoplasty;  Surgeon: Hessie Knows, MD;  Location: [facility];  Service: Orthopedics;  Laterality: N/A;  . kyphoplasty t10 t11    . LEFT HEART CATH AND CORONARY ANGIOGRAPHY Left 09/28/2018   Procedure: LEFT HEART CATH AND CORONARY ANGIOGRAPHY;  Surgeon: Teodoro Spray, MD;  Location: [place] [facility];  Service: Cardiovascular;  Laterality: Left;  . OPEN HEART SURGERY    . RETROPUBIC PROSTATECTOMY    . SKIN CANCER REMOVED      Home Medications:  Allergies as of 02/26/2021      Reactions   Hydrochlorothiazide W-triamterene Nausea Only      Medication List       Accurate as of February 26, 2021  7:34 PM. If you have any questions, ask your nurse or doctor.        STOP taking these medications   ciclopirox 0.77 % cream Commonly known as: LOPROX Stopped by: Hollice Espy, MD   HYDROcodone-acetaminophen 5-325 MG tablet Commonly known as: NORCO/VICODIN Stopped by: Hollice Espy, MD   traMADol 50 MG tablet Commonly known as: ULTRAM Stopped by: Hollice Espy, MD     TAKE these medications   acetaminophen 500 MG tablet Commonly known as: TYLENOL Take 1,000 mg by mouth in the morning and at bedtime.   albuterol 108 (90 Base) MCG/ACT inhaler Commonly known as: VENTOLIN HFA Inhale 2 puffs into the lungs every 4 (four) hours as needed for wheezing or shortness of breath.   aspirin EC 81 MG tablet Take 81 mg by mouth daily.   atorvastatin 80 MG tablet Commonly known as:  LIPITOR Take 40 mg by mouth at bedtime.   BENEFIBER DRINK MIX PO Take 1 Dose by mouth daily.   Calcium Carbonate-Vitamin D 600-400 MG-UNIT tablet Take 1 tablet by mouth 2 (two) times daily.   cholecalciferol 25 MCG (1000 UNIT) tablet Commonly known as: VITAMIN D Take 3,000 Units by mouth daily.   cyanocobalamin 1000 MCG tablet Take 1,000 mcg by mouth at bedtime.   Emergen-C Vitamin C Pack Take 1 packet by mouth daily.   fexofenadine 180 MG tablet Commonly known as: ALLEGRA Take 180 mg by mouth daily before breakfast.   Fish Oil 1000 MG Caps Take 1,000 mg by mouth 2 (two) times daily.   fluticasone 50 MCG/ACT nasal spray Commonly known as: FLONASE Place 2 sprays into both nostrils at bedtime.   gabapentin 300  MG capsule Commonly known as: NEURONTIN Take 300 mg by mouth at bedtime.   hydrocortisone 25 MG suppository Commonly known as: ANUSOL-HC Place 25 mg rectally 2 (two) times daily as needed for hemorrhoids or anal itching.   ibuprofen 200 MG tablet Commonly known as: ADVIL Take 400 mg by mouth 3 (three) times daily.   isosorbide mononitrate 60 MG 24 hr tablet Commonly known as: IMDUR Take 60 mg by mouth daily.   Magnesium 400 MG Caps Take 400 mg by mouth 2 (two) times daily.   metoprolol tartrate 25 MG tablet Commonly known as: LOPRESSOR Take 25 mg by mouth 2 (two) times daily.   niacin 500 MG tablet Take 500 mg by mouth 3 (three) times daily.   nitroGLYCERIN 0.4 MG SL tablet Commonly known as: NITROSTAT Place 0.4 mg under the tongue every 5 (five) minutes x 3 doses as needed for chest pain. If no relief call MD or go to emergency room.   omeprazole 40 MG capsule Commonly known as: PRILOSEC Take 1 capsule by mouth 2 (two) times daily. What changed: Another medication with the same name was removed. Continue taking this medication, and follow the directions you see here. Changed by: Hollice Espy, MD   potassium chloride SA 20 MEQ tablet Commonly  known as: KLOR-CON Take 20 mEq by mouth 2 (two) times daily.   Probiotic Caps Take 2 capsules by mouth at bedtime.   senna-docusate 8.6-50 MG tablet Commonly known as: Senokot-S Take 2 tablets by mouth 2 (two) times daily.   sucralfate 1 g tablet Commonly known as: CARAFATE Take 1 g by mouth 4 (four) times daily.   TART CHERRY PO Take 1 capsule by mouth daily.   Tiotropium Bromide-Olodaterol 2.5-2.5 MCG/ACT Aers Inhale 2 puffs into the lungs daily.   torsemide 20 MG tablet Commonly known as: DEMADEX Take 60 mg by mouth daily.       Allergies:  Allergies  Allergen Reactions  . Hydrochlorothiazide W-Triamterene Nausea Only    Family History: Family History  Problem Relation Age of Onset  . Cancer Father   . Prostate cancer Father   . Bladder Cancer Neg Hx   . Kidney cancer Neg Hx     Social History:  reports that he quit smoking about 30 years ago. He quit after 35.00 years of use. He has never used smokeless tobacco. He reports that he does not drink alcohol and does not use drugs.   Physical Exam: BP 128/81   Pulse 86   Ht 5\' 8"  (1.727 m)   Wt 176 lb (79.8 kg)   BMI 26.76 kg/m   Constitutional:  Alert and oriented, No acute distress. HEENT: [place] AT, moist mucus membranes.  Trachea midline, no masses. Cardiovascular: No clubbing, cyanosis, or edema. Respiratory: Normal respiratory effort, no increased work of breathing. Neurologic: Grossly intact, no focal deficits, moving all 4 extremities. Psychiatric: Normal mood and affect.  Laboratory Data: Creatinine 1.4 as of 01/2001  PSA  detectable as above  Urinalysis Results for orders placed or performed in visit on 02/26/21  Microscopic Examination   Urine  Result Value Ref Range   WBC, UA 0-5 0 - 5 /hpf   RBC 0-2 0 - 2 /hpf   Epithelial Cells (non renal) None seen 0 - 10 /hpf   Casts Present (A) None seen /lpf   Cast Type Hyaline casts N/A   Bacteria, UA None seen None seen/Few  Urinalysis, Complete   Result Value Ref Range   Specific  Gravity, UA <1.005 (L) 1.005 - 1.030   pH, UA 6.0 5.0 - 7.5   Color, UA Yellow Yellow   Appearance Ur Clear Clear   Leukocytes,UA Negative Negative   Protein,UA Negative Negative/Trace   Glucose, UA Negative Negative   Ketones, UA Negative Negative   RBC, UA Negative Negative   Bilirubin, UA Negative Negative   Urobilinogen, Ur 0.2 0.2 - 1.0 mg/dL   Nitrite, UA Negative Negative   Microscopic Examination See below:   BLADDER SCAN AMB NON-IMAGING  Result Value Ref Range   Scan Result 63ml     Pertinent Imaging: CT abd pelvis form 4/22 reviewed, 1 cm left lower pole stone  Assessment & Plan:    1. Bladder pain Suprapubic discomfort with bladder filling of unclear etiology  No evidence of urinary retention, incomplete bladder emptying, or infection is contributing factors  We discussed consideration of cystoscopy to rule out bladder neck contracture or any underlying bladder pathology  We also discussed that his discomfort may be GI in origin rather than GU especially given his other abdominal discomfort which he agrees - Urinalysis, Complete - BLADDER SCAN AMB NON-IMAGING  2. Kidney stone on left side Stable 1 cm left lower pole kidney stone, asymptomatic  Agree with conservative management this point time given chronicity and lack of symptoms  3. History of prostate cancer PSA remains undetectable, continue to monitor annually  4. SUI (stress urinary incontinence), male Fairly significant worsening stress urinary incontinence although patient has minimal bother from this  Discussed alternative options including referral for consideration of intervention versus pelvic floor therapy, elected to defer   Return for cysto.  Hollice Espy, MD  [facility] [address] [place], [place] [address] [phone]

## 2021-02-26 ENCOUNTER — Other Ambulatory Visit: Payer: Self-pay

## 2021-02-26 ENCOUNTER — Encounter: Payer: Self-pay | Admitting: Urology

## 2021-02-26 ENCOUNTER — Ambulatory Visit (INDEPENDENT_AMBULATORY_CARE_PROVIDER_SITE_OTHER): Payer: Medicare Other | Admitting: Urology

## 2021-02-26 VITALS — BP 128/81 | HR 86 | Ht 68.0 in | Wt 176.0 lb

## 2021-02-26 DIAGNOSIS — R3989 Other symptoms and signs involving the genitourinary system: Secondary | ICD-10-CM | POA: Diagnosis not present

## 2021-02-26 DIAGNOSIS — Z8546 Personal history of malignant neoplasm of prostate: Secondary | ICD-10-CM

## 2021-02-26 DIAGNOSIS — N393 Stress incontinence (female) (male): Secondary | ICD-10-CM

## 2021-02-26 DIAGNOSIS — N200 Calculus of kidney: Secondary | ICD-10-CM | POA: Diagnosis not present

## 2021-02-26 LAB — BLADDER SCAN AMB NON-IMAGING

## 2021-02-26 LAB — URINALYSIS, COMPLETE
Bilirubin, UA: NEGATIVE
Glucose, UA: NEGATIVE
Ketones, UA: NEGATIVE
Leukocytes,UA: NEGATIVE
Nitrite, UA: NEGATIVE
Protein,UA: NEGATIVE
RBC, UA: NEGATIVE
Specific Gravity, UA: <1.005 — ABNORMAL LOW (ref 1.005–1.030)
Urobilinogen, Ur: 0.2 mg/dL (ref 0.2–1.0)
pH, UA: 6 (ref 5.0–7.5)

## 2021-02-26 LAB — MICROSCOPIC EXAMINATION
Bacteria, UA: NONE SEEN
Epithelial Cells (non renal): NONE SEEN /hpf (ref 0–10)

## 2021-02-26 NOTE — Patient Instructions (Signed)
Cystoscopy Cystoscopy is a procedure that is used to help diagnose and sometimes treat conditions that affect the lower urinary tract. The lower urinary tract includes the bladder and the urethra. The urethra is the tube that drains urine from the bladder. Cystoscopy is done using a thin, tube-shaped instrument with a light and camera at the end (cystoscope). The cystoscope may be hard or flexible, depending on the goal of the procedure. The cystoscope is inserted through the urethra, into the bladder. Cystoscopy may be recommended if you have:  Urinary tract infections that keep coming back.  Blood in the urine (hematuria).  An inability to control when you urinate (urinary incontinence) or an overactive bladder.  Unusual cells found in a urine sample.  A blockage in the urethra, such as a urinary stone.  Painful urination.  An abnormality in the bladder found during an intravenous pyelogram (IVP) or CT scan. Cystoscopy may also be done to remove a sample of tissue to be examined under a microscope (biopsy). What are the risks? Generally, this is a safe procedure. However, problems may occur, including:  Infection.  Bleeding.  What happens during the procedure?  1. You will be given one or more of the following: ? A medicine to numb the area (local anesthetic). 2. The area around the opening of your urethra will be cleaned. 3. The cystoscope will be passed through your urethra into your bladder. 4. Germ-free (sterile) fluid will flow through the cystoscope to fill your bladder. The fluid will stretch your bladder so that your health care provider can clearly examine your bladder walls. 5. Your doctor will look at the urethra and bladder. 6. The cystoscope will be removed The procedure may vary among health care providers  What can I expect after the procedure? After the procedure, it is common to have: 1. Some soreness or pain in your abdomen and urethra. 2. Urinary symptoms.  These include: ? Mild pain or burning when you urinate. Pain should stop within a few minutes after you urinate. This may last for up to 1 week. ? A small amount of blood in your urine for several days. ? Feeling like you need to urinate but producing only a small amount of urine. Follow these instructions at home: General instructions  Return to your normal activities as told by your health care provider.   Do not drive for 24 hours if you were given a sedative during your procedure.  Watch for any blood in your urine. If the amount of blood in your urine increases, call your health care provider.  If a tissue sample was removed for testing (biopsy) during your procedure, it is up to you to get your test results. Ask your health care provider, or the department that is doing the test, when your results will be ready.  Drink enough fluid to keep your urine pale yellow.  Keep all follow-up visits as told by your health care provider. This is important. Contact a health care provider if you:  Have pain that gets worse or does not get better with medicine, especially pain when you urinate.  Have trouble urinating.  Have more blood in your urine. Get help right away if you:  Have blood clots in your urine.  Have abdominal pain.  Have a fever or chills.  Are unable to urinate. Summary  Cystoscopy is a procedure that is used to help diagnose and sometimes treat conditions that affect the lower urinary tract.  Cystoscopy is done using   a thin, tube-shaped instrument with a light and camera at the end.  After the procedure, it is common to have some soreness or pain in your abdomen and urethra.  Watch for any blood in your urine. If the amount of blood in your urine increases, call your health care provider.  If you were prescribed an antibiotic medicine, take it as told by your health care provider. Do not stop taking the antibiotic even if you start to feel better. This  information is not intended to replace advice given to you by your health care provider. Make sure you discuss any questions you have with your health care provider. Document Revised: 08/31/2018 Document Reviewed: 08/31/2018 Elsevier Patient Education  2020 Elsevier Inc[employer]   

## 2021-02-27 ENCOUNTER — Other Ambulatory Visit: Payer: Self-pay | Admitting: Urology

## 2021-03-05 ENCOUNTER — Other Ambulatory Visit: Payer: Self-pay

## 2021-03-05 ENCOUNTER — Ambulatory Visit (INDEPENDENT_AMBULATORY_CARE_PROVIDER_SITE_OTHER): Payer: Medicare Other | Admitting: Urology

## 2021-03-05 ENCOUNTER — Encounter: Payer: Self-pay | Admitting: Urology

## 2021-03-05 VITALS — BP 116/62 | HR 67 | Ht 68.0 in | Wt 165.0 lb

## 2021-03-05 DIAGNOSIS — N393 Stress incontinence (female) (male): Secondary | ICD-10-CM

## 2021-03-05 DIAGNOSIS — Z8546 Personal history of malignant neoplasm of prostate: Secondary | ICD-10-CM | POA: Diagnosis not present

## 2021-03-05 DIAGNOSIS — R3989 Other symptoms and signs involving the genitourinary system: Secondary | ICD-10-CM | POA: Diagnosis not present

## 2021-03-05 DIAGNOSIS — N200 Calculus of kidney: Secondary | ICD-10-CM

## 2021-03-05 DIAGNOSIS — K4090 Unilateral inguinal hernia, without obstruction or gangrene, not specified as recurrent: Secondary | ICD-10-CM

## 2021-03-05 LAB — MICROSCOPIC EXAMINATION
Bacteria, UA: NONE SEEN
Epithelial Cells (non renal): NONE SEEN /hpf (ref 0–10)

## 2021-03-05 LAB — URINALYSIS, COMPLETE
Bilirubin, UA: NEGATIVE
Glucose, UA: NEGATIVE
Ketones, UA: NEGATIVE
Leukocytes,UA: NEGATIVE
Nitrite, UA: NEGATIVE
Protein,UA: NEGATIVE
RBC, UA: NEGATIVE
Specific Gravity, UA: <1.005 — ABNORMAL LOW (ref 1.005–1.030)
Urobilinogen, Ur: 0.2 mg/dL (ref 0.2–1.0)
pH, UA: 5.5 (ref 5.0–7.5)

## 2021-03-05 NOTE — Progress Notes (Signed)
   03/05/21  CC:  Chief Complaint  Patient presents with   Cysto    HPI: 80 year old male with notable pain with bladder filling who presents today for cystoscopic evaluation of this.  Notably, he mentions today that he is identified a new issue.  He has some fullness, discomfort in his left lower quadrant which is firm at times, worse with standing.  He believes he may have a hernia on this side.  Blood pressure 116/62, pulse 67, height 5\' 8"  (1.727 m), weight 165 lb (74.8 kg). NED. A&Ox3.   No respiratory distress   Fullness of the left inguinal area with reducible with feels like loop of bowel notable Normal phallus with bilateral descended testicles  Cystoscopy Procedure Note  Patient identification was confirmed, informed consent was obtained, and patient was prepped using Betadine solution.  Lidocaine jelly was administered per urethral meatus.     Pre-Procedure: - Inspection reveals a normal caliber ureteral meatus.  Procedure: The flexible cystoscope was introduced without difficulty - No urethral strictures/lesions are present. - Surgically absent prostate  -  Tight  bladder neck with mild contracture, ~16 Fr barely able to advance flexible scope with some manipulation - Bilateral ureteral orifices identified - Bladder mucosa  reveals no ulcers, tumors, or lesions - No bladder stones - Mild trabeculation  Retroflexion shows absent prostate   Post-Procedure: - Patient tolerated the procedure well  Assessment/ Plan:  1. Left inguinal hernia Symptomatic left inguinal hernia with probable reducible loop of bowel on exam today  He is interested in general surgery referral which will arrange today - Ambulatory referral to General Surgery  2. 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 Kidney stone on left side Asymptomatic, recommend observation  4. History of prostate cancer Recommend annual PSA  5. SUI (stress urinary incontinence), male Minimal bother    F/u 1 year  Hollice Espy, MD

## 2021-03-10 ENCOUNTER — Encounter
Admission: EM | Disposition: A | Discharge status: Home Health | Payer: Self-pay | Source: Home / Self Care | Attending: Pulmonary Disease

## 2021-03-10 ENCOUNTER — Emergency Department: Payer: Medicare Other

## 2021-03-10 ENCOUNTER — Encounter: Payer: Self-pay | Admitting: Anesthesiology

## 2021-03-10 ENCOUNTER — Inpatient Hospital Stay: Payer: Medicare Other

## 2021-03-10 ENCOUNTER — Other Ambulatory Visit: Payer: Self-pay

## 2021-03-10 ENCOUNTER — Encounter: Payer: Self-pay | Admitting: Emergency Medicine

## 2021-03-10 ENCOUNTER — Inpatient Hospital Stay
Admission: EM | Admit: 2021-03-10 | Discharge: 2021-03-15 | DRG: 871 | Disposition: A | Discharge status: Home Health | Payer: Medicare Other | Attending: Internal Medicine | Admitting: Internal Medicine

## 2021-03-10 DIAGNOSIS — I739 Peripheral vascular disease, unspecified: Secondary | ICD-10-CM | POA: Diagnosis present

## 2021-03-10 DIAGNOSIS — I25810 Atherosclerosis of coronary artery bypass graft(s) without angina pectoris: Secondary | ICD-10-CM | POA: Diagnosis present

## 2021-03-10 DIAGNOSIS — A419 Sepsis, unspecified organism: Principal | ICD-10-CM

## 2021-03-10 DIAGNOSIS — I480 Paroxysmal atrial fibrillation: Secondary | ICD-10-CM | POA: Diagnosis present

## 2021-03-10 DIAGNOSIS — Z20822 Contact with and (suspected) exposure to covid-19: Secondary | ICD-10-CM | POA: Diagnosis present

## 2021-03-10 DIAGNOSIS — M7551 Bursitis of right shoulder: Secondary | ICD-10-CM | POA: Diagnosis present

## 2021-03-10 DIAGNOSIS — I2510 Atherosclerotic heart disease of native coronary artery without angina pectoris: Secondary | ICD-10-CM | POA: Diagnosis present

## 2021-03-10 DIAGNOSIS — R918 Other nonspecific abnormal finding of lung field: Secondary | ICD-10-CM | POA: Diagnosis present

## 2021-03-10 DIAGNOSIS — I959 Hypotension, unspecified: Secondary | ICD-10-CM | POA: Diagnosis present

## 2021-03-10 DIAGNOSIS — J189 Pneumonia, unspecified organism: Secondary | ICD-10-CM

## 2021-03-10 DIAGNOSIS — Z974 Presence of external hearing-aid: Secondary | ICD-10-CM

## 2021-03-10 DIAGNOSIS — M25511 Pain in right shoulder: Secondary | ICD-10-CM

## 2021-03-10 DIAGNOSIS — I252 Old myocardial infarction: Secondary | ICD-10-CM

## 2021-03-10 DIAGNOSIS — M810 Age-related osteoporosis without current pathological fracture: Secondary | ICD-10-CM | POA: Diagnosis present

## 2021-03-10 DIAGNOSIS — E872 Acidosis: Secondary | ICD-10-CM | POA: Diagnosis present

## 2021-03-10 DIAGNOSIS — G4733 Obstructive sleep apnea (adult) (pediatric): Secondary | ICD-10-CM | POA: Diagnosis present

## 2021-03-10 DIAGNOSIS — R6521 Severe sepsis with septic shock: Secondary | ICD-10-CM | POA: Diagnosis present

## 2021-03-10 DIAGNOSIS — Z9049 Acquired absence of other specified parts of digestive tract: Secondary | ICD-10-CM

## 2021-03-10 DIAGNOSIS — Z7982 Long term (current) use of aspirin: Secondary | ICD-10-CM

## 2021-03-10 DIAGNOSIS — Z79899 Other long term (current) drug therapy: Secondary | ICD-10-CM

## 2021-03-10 DIAGNOSIS — K4030 Unilateral inguinal hernia, with obstruction, without gangrene, not specified as recurrent: Secondary | ICD-10-CM

## 2021-03-10 DIAGNOSIS — K4090 Unilateral inguinal hernia, without obstruction or gangrene, not specified as recurrent: Secondary | ICD-10-CM | POA: Diagnosis not present

## 2021-03-10 DIAGNOSIS — E785 Hyperlipidemia, unspecified: Secondary | ICD-10-CM | POA: Diagnosis present

## 2021-03-10 DIAGNOSIS — J9600 Acute respiratory failure, unspecified whether with hypoxia or hypercapnia: Secondary | ICD-10-CM

## 2021-03-10 DIAGNOSIS — Z951 Presence of aortocoronary bypass graft: Secondary | ICD-10-CM

## 2021-03-10 DIAGNOSIS — D696 Thrombocytopenia, unspecified: Secondary | ICD-10-CM

## 2021-03-10 DIAGNOSIS — M1990 Unspecified osteoarthritis, unspecified site: Secondary | ICD-10-CM | POA: Diagnosis present

## 2021-03-10 DIAGNOSIS — J9601 Acute respiratory failure with hypoxia: Secondary | ICD-10-CM | POA: Diagnosis present

## 2021-03-10 DIAGNOSIS — J181 Lobar pneumonia, unspecified organism: Secondary | ICD-10-CM | POA: Diagnosis present

## 2021-03-10 DIAGNOSIS — I4891 Unspecified atrial fibrillation: Secondary | ICD-10-CM

## 2021-03-10 DIAGNOSIS — K56609 Unspecified intestinal obstruction, unspecified as to partial versus complete obstruction: Secondary | ICD-10-CM

## 2021-03-10 DIAGNOSIS — J440 Chronic obstructive pulmonary disease with acute lower respiratory infection: Secondary | ICD-10-CM | POA: Diagnosis present

## 2021-03-10 DIAGNOSIS — Z888 Allergy status to other drugs, medicaments and biological substances status: Secondary | ICD-10-CM

## 2021-03-10 DIAGNOSIS — I10 Essential (primary) hypertension: Secondary | ICD-10-CM | POA: Diagnosis present

## 2021-03-10 DIAGNOSIS — Z87891 Personal history of nicotine dependence: Secondary | ICD-10-CM

## 2021-03-10 DIAGNOSIS — R627 Adult failure to thrive: Secondary | ICD-10-CM | POA: Diagnosis present

## 2021-03-10 DIAGNOSIS — Z955 Presence of coronary angioplasty implant and graft: Secondary | ICD-10-CM

## 2021-03-10 DIAGNOSIS — Z8546 Personal history of malignant neoplasm of prostate: Secondary | ICD-10-CM

## 2021-03-10 DIAGNOSIS — Z9079 Acquired absence of other genital organ(s): Secondary | ICD-10-CM

## 2021-03-10 DIAGNOSIS — N400 Enlarged prostate without lower urinary tract symptoms: Secondary | ICD-10-CM | POA: Diagnosis present

## 2021-03-10 DIAGNOSIS — R0689 Other abnormalities of breathing: Secondary | ICD-10-CM | POA: Diagnosis not present

## 2021-03-10 DIAGNOSIS — R52 Pain, unspecified: Secondary | ICD-10-CM

## 2021-03-10 DIAGNOSIS — Z8042 Family history of malignant neoplasm of prostate: Secondary | ICD-10-CM

## 2021-03-10 DIAGNOSIS — Z86008 Personal history of in-situ neoplasm of other site: Secondary | ICD-10-CM

## 2021-03-10 DIAGNOSIS — K219 Gastro-esophageal reflux disease without esophagitis: Secondary | ICD-10-CM | POA: Diagnosis present

## 2021-03-10 DIAGNOSIS — Z85828 Personal history of other malignant neoplasm of skin: Secondary | ICD-10-CM

## 2021-03-10 DIAGNOSIS — Z89421 Acquired absence of other right toe(s): Secondary | ICD-10-CM

## 2021-03-10 LAB — CBC
HCT: 38.7 % — ABNORMAL LOW (ref 39.0–52.0)
Hemoglobin: 12.9 g/dL — ABNORMAL LOW (ref 13.0–17.0)
MCH: 32.3 pg (ref 26.0–34.0)
MCHC: 33.3 g/dL (ref 30.0–36.0)
MCV: 97 fL (ref 80.0–100.0)
Platelets: 105 10*3/uL — ABNORMAL LOW (ref 150–400)
RBC: 3.99 MIL/uL — ABNORMAL LOW (ref 4.22–5.81)
RDW: 14.2 % (ref 11.5–15.5)
WBC: 3.8 10*3/uL — ABNORMAL LOW (ref 4.0–10.5)
nRBC: 0 % (ref 0.0–0.2)

## 2021-03-10 LAB — RESPIRATORY PANEL BY PCR

## 2021-03-10 LAB — URINALYSIS, COMPLETE (UACMP) WITH MICROSCOPIC
Bilirubin Urine: NEGATIVE
Glucose, UA: NEGATIVE mg/dL
Hgb urine dipstick: NEGATIVE
Ketones, ur: NEGATIVE mg/dL
Leukocytes,Ua: NEGATIVE
Nitrite: NEGATIVE
Protein, ur: NEGATIVE mg/dL
Specific Gravity, Urine: 1.036 — ABNORMAL HIGH (ref 1.005–1.030)
pH: 5 (ref 5.0–8.0)

## 2021-03-10 LAB — BASIC METABOLIC PANEL
Anion gap: 12 (ref 5–15)
BUN: 21 mg/dL (ref 8–23)
CO2: 25 mmol/L (ref 22–32)
Calcium: 9.4 mg/dL (ref 8.9–10.3)
Chloride: 99 mmol/L (ref 98–111)
Creatinine, Ser: 2.1 mg/dL — ABNORMAL HIGH (ref 0.61–1.24)
GFR, Estimated: 31 mL/min — ABNORMAL LOW (ref >60)
Glucose, Bld: 138 mg/dL — ABNORMAL HIGH (ref 70–99)
Potassium: 4.1 mmol/L (ref 3.5–5.1)
Sodium: 136 mmol/L (ref 135–145)

## 2021-03-10 LAB — RESP PANEL BY RT-PCR (FLU A&B, COVID) ARPGX2
Influenza A by PCR: NEGATIVE
Influenza B by PCR: NEGATIVE
SARS Coronavirus 2 by RT PCR: NEGATIVE

## 2021-03-10 LAB — LACTIC ACID, PLASMA
Lactic Acid, Venous: 5.3 mmol/L (ref 0.5–1.9)
Lactic Acid, Venous: 4.1 mmol/L (ref 0.5–1.9)
Lactic Acid, Venous: 2.5 mmol/L (ref 0.5–1.9)

## 2021-03-10 LAB — STREP PNEUMONIAE URINARY ANTIGEN: Strep Pneumo Urinary Antigen: NEGATIVE

## 2021-03-10 SURGERY — REPAIR, HERNIA, INGUINAL, INCARCERATED
Anesthesia: Choice | Laterality: Left

## 2021-03-10 MED ORDER — PIPERACILLIN-TAZOBACTAM 3.375 G IVPB 30 MIN
3.3750 g | Freq: Once | INTRAVENOUS | Status: AC
  Administered 2021-03-10: 3.375 g via INTRAVENOUS
  Filled 2021-03-10: qty 50

## 2021-03-10 MED ORDER — UMECLIDINIUM BROMIDE 62.5 MCG/INH IN AEPB
1.0000 | INHALATION_SPRAY | Freq: Every day | RESPIRATORY_TRACT | Status: DC
  Administered 2021-03-11 – 2021-03-15 (×5): 1 via RESPIRATORY_TRACT
  Filled 2021-03-10: qty 7

## 2021-03-10 MED ORDER — DOCUSATE SODIUM 100 MG PO CAPS: 100.0000 mg | ORAL_CAPSULE | Freq: Two times a day (BID) | ORAL | Status: DC | PRN

## 2021-03-10 MED ORDER — ARFORMOTEROL TARTRATE 15 MCG/2ML IN NEBU
15.0000 ug | INHALATION_SOLUTION | Freq: Two times a day (BID) | RESPIRATORY_TRACT | Status: DC
  Administered 2021-03-11 – 2021-03-15 (×6): 15 ug via RESPIRATORY_TRACT
  Filled 2021-03-10 (×10): qty 2

## 2021-03-10 MED ORDER — PIPERACILLIN-TAZOBACTAM 3.375 G IVPB
3.3750 g | Freq: Three times a day (TID) | INTRAVENOUS | Status: DC
  Administered 2021-03-10 – 2021-03-15 (×15): 3.375 g via INTRAVENOUS
  Filled 2021-03-10 (×15): qty 50

## 2021-03-10 MED ORDER — SODIUM CHLORIDE 0.9 % IV BOLUS
500.0000 mL | Freq: Once | INTRAVENOUS | Status: AC
  Administered 2021-03-10: 500 mL via INTRAVENOUS

## 2021-03-10 MED ORDER — ONDANSETRON HCL 4 MG/2ML IJ SOLN: 4.0000 mg | Freq: Four times a day (QID) | INTRAMUSCULAR | Status: DC | PRN

## 2021-03-10 MED ORDER — SODIUM CHLORIDE 0.9 % IV SOLN: INTRAVENOUS | Status: AC

## 2021-03-10 MED ORDER — CHLORHEXIDINE GLUCONATE CLOTH 2 % EX PADS
6.0000 | MEDICATED_PAD | Freq: Every day | CUTANEOUS | Status: DC
  Administered 2021-03-11 – 2021-03-13 (×3): 6 via TOPICAL

## 2021-03-10 MED ORDER — IOHEXOL 9 MG/ML PO SOLN
1000.0000 mL | ORAL | Status: AC | PRN
  Administered 2021-03-10: 1000 mL via ORAL

## 2021-03-10 MED ORDER — FLUTICASONE PROPIONATE 50 MCG/ACT NA SUSP
1.0000 | Freq: Every evening | NASAL | Status: DC | PRN
  Administered 2021-03-10: 1 via NASAL
  Filled 2021-03-10: qty 16

## 2021-03-10 MED ORDER — SODIUM CHLORIDE 0.9 % IV SOLN: 250.0000 mL | INTRAVENOUS | Status: DC

## 2021-03-10 MED ORDER — ONDANSETRON HCL 4 MG/2ML IJ SOLN
4.0000 mg | Freq: Once | INTRAMUSCULAR | Status: AC
  Administered 2021-03-10: 4 mg via INTRAVENOUS
  Filled 2021-03-10: qty 2

## 2021-03-10 MED ORDER — LACTATED RINGERS IV BOLUS
1000.0000 mL | Freq: Once | INTRAVENOUS | Status: AC
  Administered 2021-03-10: 1000 mL via INTRAVENOUS

## 2021-03-10 MED ORDER — IOHEXOL 300 MG/ML  SOLN
75.0000 mL | Freq: Once | INTRAMUSCULAR | Status: AC | PRN
  Administered 2021-03-10: 75 mL via INTRAVENOUS

## 2021-03-10 MED ORDER — PANTOPRAZOLE SODIUM 40 MG IV SOLR
40.0000 mg | Freq: Every day | INTRAVENOUS | Status: DC
  Administered 2021-03-10: 40 mg via INTRAVENOUS
  Filled 2021-03-10: qty 40

## 2021-03-10 MED ORDER — METRONIDAZOLE 500 MG/100ML IV SOLN
500.0000 mg | Freq: Once | INTRAVENOUS | Status: AC
  Administered 2021-03-10: 500 mg via INTRAVENOUS
  Filled 2021-03-10: qty 100

## 2021-03-10 MED ORDER — SODIUM CHLORIDE 0.9 % IV SOLN: INTRAVENOUS | Status: DC

## 2021-03-10 MED ORDER — FENTANYL CITRATE (PF) 100 MCG/2ML IJ SOLN: 100.0000 ug | Freq: Once | INTRAMUSCULAR | Status: DC

## 2021-03-10 MED ORDER — SODIUM CHLORIDE 0.9 % IV SOLN
250.0000 mL | INTRAVENOUS | Status: DC
  Administered 2021-03-10: 250 mL via INTRAVENOUS

## 2021-03-10 MED ORDER — NOREPINEPHRINE 4 MG/250ML-% IV SOLN
2.0000 ug/min | INTRAVENOUS | Status: DC
Start: 2021-03-10 — End: 2021-03-13
  Administered 2021-03-10: 8 ug/min via INTRAVENOUS
  Administered 2021-03-10 – 2021-03-11 (×2): 5 ug/min via INTRAVENOUS
  Filled 2021-03-10 (×2): qty 250

## 2021-03-10 MED ORDER — ROCURONIUM BROMIDE 50 MG/5ML IV SOLN: 50.0000 mg | Freq: Once | INTRAVENOUS | Status: DC

## 2021-03-10 MED ORDER — ALBUTEROL SULFATE (2.5 MG/3ML) 0.083% IN NEBU: 2.5000 mg | INHALATION_SOLUTION | RESPIRATORY_TRACT | Status: DC | PRN

## 2021-03-10 MED ORDER — MORPHINE SULFATE (PF) 2 MG/ML IV SOLN
1.0000 mg | INTRAVENOUS | Status: DC | PRN
  Administered 2021-03-10 – 2021-03-14 (×5): 1 mg via INTRAVENOUS
  Filled 2021-03-10 (×5): qty 1

## 2021-03-10 MED ORDER — NOREPINEPHRINE 4 MG/250ML-% IV SOLN
0.0000 ug/min | INTRAVENOUS | Status: DC
  Administered 2021-03-10: 5 ug/min via INTRAVENOUS
  Filled 2021-03-10: qty 250

## 2021-03-10 MED ORDER — POLYETHYLENE GLYCOL 3350 17 G PO PACK: 17.0000 g | PACK | Freq: Every day | ORAL | Status: DC | PRN

## 2021-03-10 MED ORDER — MIDAZOLAM HCL 2 MG/2ML IJ SOLN: 2.0000 mg | Freq: Once | INTRAMUSCULAR | Status: DC

## 2021-03-10 MED ORDER — SODIUM CHLORIDE 0.9 % IV BOLUS
1000.0000 mL | Freq: Once | INTRAVENOUS | Status: AC
  Administered 2021-03-10: 1000 mL via INTRAVENOUS

## 2021-03-10 MED ORDER — OXYCODONE-ACETAMINOPHEN 5-325 MG PO TABS
1.0000 | ORAL_TABLET | ORAL | Status: DC | PRN
  Administered 2021-03-10: 1 via ORAL
  Filled 2021-03-10: qty 1

## 2021-03-10 SURGICAL SUPPLY — 27 items
BLADE CLIPPER SURG (BLADE) ×2 IMPLANT
CANISTER SUCT 1200ML W/VALVE (MISCELLANEOUS) ×2 IMPLANT
CHLORAPREP W/TINT 26 (MISCELLANEOUS) ×2 IMPLANT
COVER WAND RF STERILE (DRAPES) ×2 IMPLANT
DERMABOND ADVANCED (GAUZE/BANDAGES/DRESSINGS) ×1
DERMABOND ADVANCED .7 DNX12 (GAUZE/BANDAGES/DRESSINGS) ×1 IMPLANT
DRAIN PENROSE 0.625X18 (DRAIN) ×2 IMPLANT
DRAPE INCISE IOBAN 66X45 STRL (DRAPES) ×2 IMPLANT
DRAPE LAPAROTOMY 77X122 PED (DRAPES) ×2 IMPLANT
ELECT REM PT RETURN 9FT ADLT (ELECTROSURGICAL) ×2
ELECTRODE REM PT RTRN 9FT ADLT (ELECTROSURGICAL) ×1 IMPLANT
GLOVE SURG ENC MOIS LTX SZ7 (GLOVE) ×2 IMPLANT
GOWN STRL REUS W/ TWL LRG LVL3 (GOWN DISPOSABLE) ×2 IMPLANT
GOWN STRL REUS W/TWL LRG LVL3 (GOWN DISPOSABLE) ×2
MANIFOLD NEPTUNE II (INSTRUMENTS) ×2 IMPLANT
NEEDLE HYPO 22GX1.5 SAFETY (NEEDLE) ×2 IMPLANT
NS IRRIG 1000ML POUR BTL (IV SOLUTION) ×2 IMPLANT
PACK BASIN MINOR ARMC (MISCELLANEOUS) ×2 IMPLANT
SPONGE KITTNER 5P (MISCELLANEOUS) ×2 IMPLANT
SPONGE LAP 18X18 RF (DISPOSABLE) ×2 IMPLANT
SUT ETHIBOND NAB MO 7 #0 18IN (SUTURE) ×4 IMPLANT
SUT MNCRL AB 4-0 PS2 18 (SUTURE) ×2 IMPLANT
SUT VIC AB 3-0 54X BRD REEL (SUTURE) ×1 IMPLANT
SUT VIC AB 3-0 BRD 54 (SUTURE) ×1
SUT VIC AB 3-0 SH 27 (SUTURE) ×1
SUT VIC AB 3-0 SH 27X BRD (SUTURE) ×1 IMPLANT
SYR 20ML LL LF (SYRINGE) ×2 IMPLANT

## 2021-03-10 NOTE — ED Triage Notes (Signed)
Pt reports was really weak this am and has trouble getting up. Pt also reports some NVD for the last few days.

## 2021-03-10 NOTE — ED Triage Notes (Signed)
Pt daughter Ivin Booty, phone number [phone]

## 2021-03-10 NOTE — ED Triage Notes (Signed)
Pt in via EMS from home with c/o NVD for the past 2 days. Pt awoke this am very weak. Per pts daughter he has these episodes every 2 weeks since October. Pt also has hernia that he is scheduled for surgery on soon.   83/44 BP, HR 94, FSBS 159

## 2021-03-10 NOTE — ED Notes (Signed)
Patient transported to CT 

## 2021-03-10 NOTE — ED Notes (Signed)
Pt taken off the NRB and placed on 5L [place] by Dr. Charlsie Quest, pt sat 94% on 5L

## 2021-03-10 NOTE — Consult Note (Signed)
Patient ID: Justin [name], male   DOB: 1940/10/16, 80 y.o.   MRN: [id number]  HPI Justin [name] is a 80 y.o. male seen in consultation at the quest of Dr. Tamala Julian.  Case discussed with him and also Dr. Lanney Gins in detail.  To the emergency room via EMS secondary to nausea vomiting and diarrhea for the last 2 days and failure to thrive.  He reports that he was very weak.  He also reports intermittent abdominal pains in the left inguinal area.  He has a complex history significant for coronary artery disease status post CABG, chronic kidney disease, COPD and her history of prostatectomy.  He did have hypotensive episode that responded to 2 L of crystalloid.  Initially Dr. Tamala Julian evaluated him and had an incarcerated hernia.  He underwent a CT scan that have personally reviewed showing evidence of a left inguinal hernia with incarceration and some small bowel obstruction.  In the process of appropriate resuscitation and transportation the left inguinal hernia has already been reduced.  The patient does not complain of any abdominal pain.  And more importantly visually has improved with normal pressure.  I also repeated a KUB showing contrast in the colon arguing against a small bowel obstruction.  Does have some thrombocytopenia platelets of 105,000.  Hemoglobin of 12.9, and a creatinine of 2.1 from baseline of 1.4 The scan also show evidence of left-sided pneumonia.  HPI  Past Medical History:  Diagnosis Date   Adenomatous polyps    Anginal pain ([place])    Arthritis    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   BPH (benign prostatic hyperplasia)    CAD (coronary artery disease)    Cataract    Chronic kidney disease    COPD (chronic obstructive pulmonary disease) (HCC)    DJD (degenerative joint disease)    Does use hearing aid    bilateral   Dyspnea    Dysrhythmia    GERD (gastroesophageal reflux disease)    History of kidney stones    History of open heart surgery    Hypertension    Myocardial  infarction [facility])    Osteoporosis    Prostate cancer ([place])    Skin cancer    Sleep apnea    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   Wears dentures    partial upper and lower    Past Surgical History:  Procedure Laterality Date   BACK SURGERY     CARDIAC CATHETERIZATION N/A 11/07/2015   Procedure: Left Heart Cath and Coronary Angiography;  Surgeon: Teodoro Spray, MD;  Location: [place] [facility];  Service: Cardiovascular;  Laterality: N/A;   CARDIAC CATHETERIZATION N/A 11/07/2015   Procedure: Coronary Stent Intervention;  Surgeon: Isaias Cowman, MD;  Location: [place] [facility];  Service: Cardiovascular;  Laterality: N/A;   CATARACT EXTRACTION     CHOLECYSTECTOMY     COLONOSCOPY N/A 06/13/2020   Procedure: COLONOSCOPY;  Surgeon: Toledo, Benay Pike, MD;  Location: [facility] ENDOSCOPY;  Service: Gastroenterology;  Laterality: N/A;   COLONOSCOPY WITH PROPOFOL N/A 03/27/2017   Procedure: COLONOSCOPY WITH PROPOFOL;  Surgeon: Manya Silvas, MD;  Location: [facility] ENDOSCOPY;  Service: Endoscopy;  Laterality: N/A;   CORONARY ANGIOPLASTY     CORONARY ARTERY BYPASS GRAFT     ESOPHAGOGASTRODUODENOSCOPY N/A 01/14/2021   Procedure: ESOPHAGOGASTRODUODENOSCOPY (EGD);  Surgeon: Toledo, Benay Pike, MD;  Location: [facility] ENDOSCOPY;  Service: Gastroenterology;  Laterality: N/A;   ESOPHAGOGASTRODUODENOSCOPY (EGD) WITH PROPOFOL N/A 03/27/2017   Procedure: ESOPHAGOGASTRODUODENOSCOPY (EGD) WITH PROPOFOL;  Surgeon: Manya Silvas, MD;  Location: [facility] ENDOSCOPY;  Service: Endoscopy;  Laterality: N/A;   EXCISION PARTIAL PHALANX Right 09/10/2017   Procedure: EXCISION PARTIAL PHALANX-2nd toe;  Surgeon: Albertine Patricia, DPM;  Location: [place];  Service: Podiatry;  Laterality: Right;  sleep apnea   EYE SURGERY     HERNIA REMOVED     HERNIA REPAIR     kissing spine syndrome     KYPHOPLASTY N/A  03/23/2020   Procedure: KYPHOPLASTY L1;  Surgeon: Hessie Knows, MD;  Location: [facility] ORS;  Service: Orthopedics;  Laterality: N/A;   KYPHOPLASTY N/A 05/15/2020   Procedure: T 12 Kyphoplasty;  Surgeon: Hessie Knows, MD;  Location: [facility] ORS;  Service: Orthopedics;  Laterality: N/A;   kyphoplasty t10 t11     LEFT HEART CATH AND CORONARY ANGIOGRAPHY Left 09/28/2018   Procedure: LEFT HEART CATH AND CORONARY ANGIOGRAPHY;  Surgeon: Teodoro Spray, MD;  Location: [place] [facility];  Service: Cardiovascular;  Laterality: Left;   OPEN HEART SURGERY     RETROPUBIC PROSTATECTOMY     SKIN CANCER REMOVED      Family History  Problem Relation Age of Onset   Cancer Father    Prostate cancer Father    Bladder Cancer Neg Hx    Kidney cancer Neg Hx     Social History Social History   Tobacco Use   Smoking status: Former    Years: 35.00    Pack years: 0.00    Types: Cigarettes    Quit date: 06/26/1990    Years since quitting: 30.7   Smokeless tobacco: Never  Vaping Use   Vaping Use: Never used  Substance Use Topics   Alcohol use: No    Alcohol/week: 0.0 standard drinks   Drug use: No    Allergies  Allergen Reactions   Hydrochlorothiazide W-Triamterene Nausea Only    Current Facility-Administered Medications  Medication Dose Route Frequency Provider Last Rate Last Admin   0.9 %  sodium chloride infusion  250 mL Intravenous Continuous Ottie Glazier, MD 20 mL/hr at 03/10/21 1500 Infusion Verify at 03/10/21 1500   0.9 %  sodium chloride infusion  250 mL Intravenous Continuous Dallie Piles, RPH       [START ON 03/11/2021] Chlorhexidine Gluconate Cloth 2 % PADS 6 each  6 each Topical Q0600 Ottie Glazier, MD       docusate sodium (COLACE) capsule 100 mg  100 mg Oral BID PRN Ottie Glazier, MD       morphine 2 MG/ML injection 1 mg  1 mg Intravenous Q4H PRN Ottie Glazier, MD       norepinephrine (LEVOPHED) 4mg  in 283mL premix infusion  2-10 mcg/min Intravenous Titrated Dallie Piles,  RPH 18.75 mL/hr at 03/10/21 1543 5 mcg/min at 03/10/21 1543   ondansetron (ZOFRAN) injection 4 mg  4 mg Intravenous Q6H PRN Ottie Glazier, MD       pantoprazole (PROTONIX) injection 40 mg  40 mg Intravenous QHS Aleskerov, Fuad, MD       piperacillin-tazobactam (ZOSYN) IVPB 3.375 g  3.375 g Intravenous Q8H Vallery Sa D, RPH       polyethylene glycol (MIRALAX / GLYCOLAX) packet 17 g  17 g Oral Daily PRN Ottie Glazier, MD       Facility-Administered Medications Ordered in Other Encounters  Medication Dose Route Frequency Provider Last Rate Last Admin  furosemide (LASIX) injection 80 mg  80 mg Intravenous Once Teodoro Spray, MD       potassium chloride (KLOR-CON) CR tablet 40 mEq  40 mEq Oral Daily Teodoro Spray, MD         Review of Systems Full ROS  was asked and was negative except for the information on the HPI  Physical Exam Blood pressure 97/64, pulse 100, temperature 98.4 F (36.9 C), temperature source Oral, resp. rate (!) 23, height 5\' 8"  (1.727 m), weight 81.4 kg, SpO2 100 %. CONSTITUTIONAL: NAD EYES: Pupils are equal, round, , Sclera are non-icteric. EARS, NOSE, MOUTH AND THROAT: Wearing a mask Hearing is intact to voice. LYMPH NODES:  Lymph nodes in the neck are normal. RESPIRATORY:  Lungs are clear. There is normal respiratory effort, decrease BS on the left and without pathologic use of accessory muscles. CARDIOVASCULAR: Heart is regular without murmurs, gallops, or rubs. GI: The abdomen is soft, nontender, and nondistended.  There is evidence of a left inguinal hernia but it is reducible. Lower midline laparotomy scar There are no palpable masses. There is no hepatosplenomegaly. There are normal bowel sounds in all quadrants. GU: Rectal deferred.   MUSCULOSKELETAL: Normal muscle strength and tone. No cyanosis or edema.   SKIN: Turgor is good and there are no pathologic skin lesions or ulcers. NEUROLOGIC: Motor and sensation is grossly normal. Cranial nerves are  grossly intact. PSYCH:  Oriented to person, place and time. Affect is normal.  Data Reviewed  I have personally reviewed the patient's imaging, laboratory findings and medical records.    Assessment/Plan 80 year old male presented with hypoxic respiratory failure pneumonia and incarcerated left inguinal hernia.  The inguinal hernia has spontaneously reduced and there is no need for emergent surgical intervention.  Given his hypoxia his hypoperfusion I do not think that he will be in the best interest of the patient to have urgent surgical intervention.  We will let him be optimized from a medical perspective on antibiotic perspective.  Ideally we will plan to perform operation after he recovers from the acute illness probably in about 3 to 4 weeks.  We will continue to follow him Time spent with the patient was 70 minutes, with more than 50% of the time spent in face-to-face education, counseling and care coordination.     Caroleen Hamman, MD FACS General Surgeon 03/10/2021, 4:07 PM

## 2021-03-10 NOTE — ED Notes (Signed)
Pt in the R upper arm infiltrated at this time. IV removed.

## 2021-03-10 NOTE — Anesthesia Preprocedure Evaluation (Deleted)
Anesthesia Evaluation  General Assessment Comment: Septic shock presumably from incarcerated ischemic inguinal hernia. On norepinephrine. Radiographic evidence of pneumonia, on supplemental [place] O2 currently.  Airway        Dental   Pulmonary sleep apnea , pneumonia, unresolved, COPD, former smoker,           Cardiovascular hypertension, + CAD, + Past MI, + CABG and + Peripheral Vascular Disease    Heart cath 2020: ? Ost LAD lesion is 100% stenosed. ? Prox Cx lesion is 100% stenosed. ? Prox RCA lesion is 100% stenosed. ? LIMA. ? Previously placed Mid Graft-1 drug eluting stent is widely patent. ? Previously placed Mid Graft-2 drug eluting stent is widely patent. ? Origin lesion is 100% stenosed. ? The left ventricular systolic function is normal. ? LV end diastolic pressure is normal. ? There is no aortic valve stenosis. ? There is no mitral valve stenosis and no mitral valve prolapse evident.   Left heart cath and coronary angiogram as well as graft angiogram was completed via right femoral approach.  Left main was patent.  Occluded proximal LAD, occluded proximal left circumflex, occluded proximal RCA.  These were chronic.  He had a patent left internal mammary to the LAD with no significant stenosis distal to the anastomosis.  He had a chronically occluded saphenous vein graft to the diagonal, patent saphenous vein graft to the distal RCA with patent stents in the mid vein graft.  LV function revealed preserved LV function.  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  This will need to be addressed post discharge from the procedure as an outpatient.  We will continue with medical management as there is no significant progression of disease post PCI done in 2017.    Neuro/Psych    GI/Hepatic GERD  ,  Endo/Other    Renal/GU CRFRenal disease     Musculoskeletal   Abdominal   Peds  Hematology    Anesthesia Other Findings   Reproductive/Obstetrics                            Anesthesia Physical Anesthesia Plan Anesthesia Quick Evaluation

## 2021-03-10 NOTE — ED Notes (Signed)
Central line cart outside room

## 2021-03-10 NOTE — H&P (Addendum)
CRITICAL CARE PROGRESS NOTE    Name: Justin [name] MRN: [id number] DOB: April 16, 1941     LOS: 0   SUBJECTIVE FINDINGS & SIGNIFICANT EVENTS    Patient description:  This is a 80yo m with NSTEMI, OSA, GERD, CAD, osteoporosis, basal cell ca, BPH with prostate CA, CKD, severe claudication, dyslipidemia, Emphysema who came to ED with complaints of  acute onset of dyspnea, vomiting and abd pain who came in due to abd pain. He was found to be severely hypotensive in circulatory shock and further had inguinal hernia which was incarcerated on right and had left sided pneumonia on CT chest. He was seen by surgery who wanted to optimize patient medically prior to outpatient OR schedule  Lines/tubes : Airway (Active)    Microbiology/Sepsis markers: Results for orders placed or performed during the hospital encounter of 03/10/21  Resp Panel by RT-PCR (Flu A&B, Covid) Nasopharyngeal Swab     Status: None   Collection Time: 03/10/21  9:45 AM   Specimen: Nasopharyngeal Swab; Nasopharyngeal(NP) swabs in vial transport medium  Result Value Ref Range Status   SARS Coronavirus 2 by RT PCR NEGATIVE NEGATIVE Final    Comment: (NOTE) SARS-CoV-2 target nucleic acids are NOT DETECTED.  The SARS-CoV-2 RNA is generally detectable in upper respiratory specimens during the acute phase of infection. The lowest concentration of SARS-CoV-2 viral copies this assay can detect is 138 copies/mL. A negative result does not preclude SARS-Cov-2 infection and should not be used as the sole basis for treatment or other patient management decisions. A negative result may occur with  improper specimen collection/handling, submission of specimen other than nasopharyngeal swab, presence of viral mutation(s) within the areas targeted by this assay, and  inadequate number of viral copies(<138 copies/mL). A negative result must be combined with clinical observations, patient history, and epidemiological information. The expected result is Negative.  Fact Sheet for Patients:  BloggerCourse.com  Fact Sheet for Healthcare Providers:  SeriousBroker.it  This test is no t yet approved or cleared by the [place] FDA and  has been authorized for detection and/or diagnosis of SARS-CoV-2 by FDA under an Emergency Use Authorization (EUA). This EUA will remain  in effect (meaning this test can be used) for the duration of the COVID-19 declaration under Section 564(b)(1) of the Act, 21 U.S.C.section 360bbb-3(b)(1), unless the authorization is terminated  or revoked sooner.       Influenza A by PCR NEGATIVE NEGATIVE Final   Influenza B by PCR NEGATIVE NEGATIVE Final    Comment: (NOTE) The Xpert Xpress SARS-CoV-2/FLU/RSV plus assay is intended as an aid in the diagnosis of influenza from Nasopharyngeal swab specimens and should not be used as a sole basis for treatment. Nasal washings and aspirates are unacceptable for Xpert Xpress SARS-CoV-2/FLU/RSV testing.  Fact Sheet for Patients: BloggerCourse.com  Fact Sheet for Healthcare Providers: SeriousBroker.it  This test is not yet approved or cleared by the [place] FDA and has been authorized for detection and/or diagnosis of SARS-CoV-2 by FDA under an Emergency Use Authorization (EUA). This EUA will remain in effect (meaning this test can be used) for the duration of the COVID-19 declaration under Section 564(b)(1) of the Act, 21 U.S.C. section 360bbb-3(b)(1), unless the authorization is terminated or revoked.  Performed at [facility], [address] [place], [place] [address]     Anti-infectives:  Anti-infectives (From admission, onward)    Start     Dose/Rate  Route Frequency Ordered Stop   03/10/21 1100  piperacillin-tazobactam (ZOSYN) IVPB 3.375 g        3.375 g 100 mL/hr over 30 Minutes Intravenous  Once 03/10/21 1048 03/10/21 1138   03/10/21 1100  metroNIDAZOLE (FLAGYL) IVPB 500 mg        500 mg 100 mL/hr over 60 Minutes Intravenous  Once 03/10/21 1048 03/10/21 1129           PAST MEDICAL HISTORY   Past Medical History:  Diagnosis Date   Adenomatous polyps    Anginal pain (HCC)    Arthritis    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   BPH (benign prostatic hyperplasia)    CAD (coronary artery disease)    Cataract    Chronic kidney disease    COPD (chronic obstructive pulmonary disease) (HCC)    DJD (degenerative joint disease)    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   Wears dentures    partial upper and lower     SURGICAL HISTORY   Past Surgical History:  Procedure Laterality Date   BACK SURGERY     CARDIAC CATHETERIZATION N/A 11/07/2015   Procedure: Left Heart Cath and Coronary Angiography;  Surgeon: Teodoro Spray, MD;  Location: [place] [facility];  Service: Cardiovascular;  Laterality: N/A;   CARDIAC CATHETERIZATION N/A 11/07/2015   Procedure: Coronary Stent Intervention;  Surgeon: Isaias Cowman, MD;  Location: [place] [facility];  Service: Cardiovascular;  Laterality: N/A;   CATARACT EXTRACTION     CHOLECYSTECTOMY     COLONOSCOPY N/A 06/13/2020   Procedure: COLONOSCOPY;  Surgeon: Toledo, Benay Pike, MD;  Location: [facility] ENDOSCOPY;  Service: Gastroenterology;  Laterality: N/A;   COLONOSCOPY WITH PROPOFOL N/A 03/27/2017    Procedure: COLONOSCOPY WITH PROPOFOL;  Surgeon: Manya Silvas, MD;  Location: [facility] ENDOSCOPY;  Service: Endoscopy;  Laterality: N/A;   CORONARY ANGIOPLASTY     CORONARY ARTERY BYPASS GRAFT     ESOPHAGOGASTRODUODENOSCOPY N/A 01/14/2021   Procedure: ESOPHAGOGASTRODUODENOSCOPY (EGD);  Surgeon: Toledo, Benay Pike, MD;  Location: [facility] ENDOSCOPY;  Service: Gastroenterology;  Laterality: N/A;   ESOPHAGOGASTRODUODENOSCOPY (EGD) WITH PROPOFOL N/A 03/27/2017   Procedure: ESOPHAGOGASTRODUODENOSCOPY (EGD) WITH PROPOFOL;  Surgeon: Manya Silvas, MD;  Location: [facility] ENDOSCOPY;  Service: Endoscopy;  Laterality: N/A;   EXCISION PARTIAL PHALANX Right 09/10/2017   Procedure: EXCISION PARTIAL PHALANX-2nd toe;  Surgeon: Albertine Patricia, DPM;  Location: [place];  Service: Podiatry;  Laterality: Right;  sleep apnea   EYE SURGERY     HERNIA REMOVED     HERNIA REPAIR     kissing spine syndrome     KYPHOPLASTY N/A 03/23/2020   Procedure: KYPHOPLASTY L1;  Surgeon: Hessie Knows, MD;  Location: [facility] ORS;  Service: Orthopedics;  Laterality: N/A;   KYPHOPLASTY N/A 05/15/2020   Procedure: T 12 Kyphoplasty;  Surgeon: Hessie Knows, MD;  Location: [facility] ORS;  Service: Orthopedics;  Laterality: N/A;   kyphoplasty t10 t11     LEFT HEART CATH AND CORONARY ANGIOGRAPHY Left 09/28/2018   Procedure: LEFT HEART  CATH AND CORONARY ANGIOGRAPHY;  Surgeon: Teodoro Spray, MD;  Location: [place] [facility];  Service: Cardiovascular;  Laterality: Left;   OPEN HEART SURGERY     RETROPUBIC PROSTATECTOMY     SKIN CANCER REMOVED       FAMILY HISTORY   Family History  Problem Relation Age of Onset   Cancer Father    Prostate cancer Father    Bladder Cancer Neg Hx    Kidney cancer Neg Hx      SOCIAL HISTORY   Social History   Tobacco Use   Smoking status: Former    Years: 35.00    Pack years: 0.00    Types: Cigarettes    Quit date: 06/26/1990    Years since quitting: 30.7   Smokeless tobacco: Never  Vaping  Use   Vaping Use: Never used  Substance Use Topics   Alcohol use: No    Alcohol/week: 0.0 standard drinks   Drug use: No     MEDICATIONS   Current Medication:  Current Facility-Administered Medications:    norepinephrine (LEVOPHED) 4mg  in 280mL premix infusion, 0-40 mcg/min, Intravenous, Continuous, Vladimir Crofts, MD, Last Rate: 18.75 mL/hr at 03/10/21 1130, 5 mcg/min at 03/10/21 1130  Current Outpatient Medications:    acetaminophen (TYLENOL) 500 MG tablet, Take 1,000 mg by mouth in the morning and at bedtime., Disp: , Rfl:    albuterol (PROVENTIL HFA;VENTOLIN HFA) 108 (90 Base) MCG/ACT inhaler, Inhale 2 puffs into the lungs every 4 (four) hours as needed for wheezing or shortness of breath., Disp: , Rfl:    aspirin EC 81 MG tablet, Take 81 mg by mouth daily., Disp: , Rfl:    atorvastatin (LIPITOR) 80 MG tablet, Take 40 mg by mouth at bedtime. , Disp: , Rfl:    Calcium Carbonate-Vitamin D 600-400 MG-UNIT tablet, Take 1 tablet by mouth 2 (two) times daily., Disp: , Rfl:    cholecalciferol (VITAMIN D) 1000 units tablet, Take 3,000 Units by mouth daily. , Disp: , Rfl:    cyanocobalamin 1000 MCG tablet, Take 1,000 mcg by mouth at bedtime. , Disp: , Rfl:    fexofenadine (ALLEGRA) 180 MG tablet, Take 180 mg by mouth daily before breakfast. , Disp: , Rfl:    fluticasone (FLONASE) 50 MCG/ACT nasal spray, Place 2 sprays into both nostrils at bedtime., Disp: , Rfl:    gabapentin (NEURONTIN) 300 MG capsule, Take 300 mg by mouth at bedtime. , Disp: , Rfl:    hydrocortisone (ANUSOL-HC) 25 MG suppository, Place 25 mg rectally 2 (two) times daily as needed for hemorrhoids or anal itching. , Disp: , Rfl:    ibuprofen (ADVIL) 200 MG tablet, Take 400 mg by mouth 3 (three) times daily., Disp: , Rfl:    isosorbide mononitrate (IMDUR) 60 MG 24 hr tablet, Take 60 mg by mouth daily., Disp: , Rfl:    Magnesium 400 MG CAPS, Take 400 mg by mouth 2 (two) times daily. , Disp: , Rfl:    metoprolol tartrate  (LOPRESSOR) 25 MG tablet, Take 25 mg by mouth 2 (two) times daily. , Disp: , Rfl:    Multiple Vitamins-Minerals (EMERGEN-C VITAMIN C) PACK, Take 1 packet by mouth daily., Disp: , Rfl:    niacin 500 MG tablet, Take 500 mg by mouth 3 (three) times daily. , Disp: , Rfl:    nitroGLYCERIN (NITROSTAT) 0.4 MG SL tablet, Place 0.4 mg under the tongue every 5 (five) minutes x 3 doses as needed for chest pain. If no relief call  MD or go to emergency room., Disp: , Rfl:    Omega-3 Fatty Acids (FISH OIL) 1000 MG CAPS, Take 1,000 mg by mouth 2 (two) times daily., Disp: , Rfl:    omeprazole (PRILOSEC) 40 MG capsule, Take 1 capsule by mouth 2 (two) times daily., Disp: , Rfl:    potassium chloride SA (K-DUR,KLOR-CON) 20 MEQ tablet, Take 20 mEq by mouth 2 (two) times daily. , Disp: , Rfl:    Probiotic CAPS, Take 2 capsules by mouth at bedtime., Disp: , Rfl:    senna-docusate (SENOKOT-S) 8.6-50 MG tablet, Take 2 tablets by mouth 2 (two) times daily., Disp: , Rfl:    sucralfate (CARAFATE) 1 g tablet, Take 1 g by mouth 4 (four) times daily., Disp: , Rfl:    TART CHERRY PO, Take 1 capsule by mouth daily., Disp: , Rfl:    Tiotropium Bromide-Olodaterol 2.5-2.5 MCG/ACT AERS, Inhale 2 puffs into the lungs daily., Disp: , Rfl:    torsemide (DEMADEX) 20 MG tablet, Take 60 mg by mouth daily. , Disp: , Rfl:    Wheat Dextrin (BENEFIBER DRINK MIX PO), Take 1 Dose by mouth daily. , Disp: , Rfl:   Facility-Administered Medications Ordered in Other Encounters:    furosemide (LASIX) injection 80 mg, 80 mg, Intravenous, Once, Fath, Javier Docker, MD   potassium chloride (KLOR-CON) CR tablet 40 mEq, 40 mEq, Oral, Daily, Fath, Javier Docker, MD    ALLERGIES   Hydrochlorothiazide w-triamterene    REVIEW OF SYSTEMS    Unable to obtain due to encephalopathy and shock.    PHYSICAL EXAMINATION   Vital Signs: Temp:  [98.5 F (36.9 C)] 98.5 F (36.9 C) (06/19 0852) Pulse Rate:  [67-100] 95 (06/19 1225) Resp:  [15-20] 18 (06/19  1225) BP: (65-109)/(40-76) 109/76 (06/19 1225) SpO2:  [90 %-100 %] 94 % (06/19 1225) Weight:  [70.3 kg] 70.3 kg (06/19 0831)  GENERAL:Age appropriate  HEAD: Normocephalic, atraumatic.  EYES: Pupils equal, round, reactive to light.  No scleral icterus.  MOUTH: Moist mucosal membrane. NECK: Supple. No thyromegaly. No nodules. No JVD.  PULMONARY: LLL decreased air entry  CARDIOVASCULAR: S1 and S2. Regular rate and rhythm. No murmurs, rubs, or gallops.  GASTROINTESTINAL: Soft, nontender, non-distended. No masses. Positive bowel sounds. No hepatosplenomegaly.  MUSCULOSKELETAL: No swelling, clubbing, or edema.  NEUROLOGIC: Mild distress due to acute illness SKIN:intact,warm,dry   PERTINENT DATA     Infusions:  norepinephrine (LEVOPHED) Adult infusion 5 mcg/min (03/10/21 1130)   Scheduled Medications:  PRN Medications:  Hemodynamic parameters:   Intake/Output: No intake/output data recorded.  Ventilator  Settings:    LAB RESULTS:  Basic Metabolic Panel: Recent Labs  Lab 03/10/21 0843  NA 136  K 4.1  CL 99  CO2 25  GLUCOSE 138*  BUN 21  CREATININE 2.10*  CALCIUM 9.4   Liver Function Tests: No results for input(s): AST, ALT, ALKPHOS, BILITOT, PROT, ALBUMIN in the last 168 hours. No results for input(s): LIPASE, AMYLASE in the last 168 hours. No results for input(s): AMMONIA in the last 168 hours. CBC: Recent Labs  Lab 03/10/21 0843  WBC 3.8*  HGB 12.9*  HCT 38.7*  MCV 97.0  PLT 105*   Cardiac Enzymes: No results for input(s): CKTOTAL, CKMB, CKMBINDEX, TROPONINI in the last 168 hours. BNP: Invalid input(s): POCBNP CBG: No results for input(s): GLUCAP in the last 168 hours.     IMAGING RESULTS:  Imaging: CT ABDOMEN PELVIS W CONTRAST  Result Date: 03/10/2021 CLINICAL DATA:  Patient with nausea and  vomiting for the prior 2 days. History of hernia. EXAM: CT ABDOMEN AND PELVIS WITH CONTRAST TECHNIQUE: Multidetector CT imaging of the abdomen and pelvis  was performed using the standard protocol following bolus administration of intravenous contrast. CONTRAST:  60mL OMNIPAQUE IOHEXOL 300 MG/ML  SOLN COMPARISON:  CT abdomen pelvis January 10, 2021. FINDINGS: Lower chest: Normal heart size. Patchy consolidative opacities within the left lower lobe and lingula. Minimal ground-glass opacities within the right lower lobe. No pleural effusion Hepatobiliary: Liver is normal in size and contour. Prior cholecystectomy. No intrahepatic or extrahepatic biliary ductal dilatation. Pancreas: Fatty atrophy of the pancreas. Spleen: Unremarkable. Adrenals/Urinary Tract: Normal adrenal glands. Kidneys enhance symmetrically with contrast. 5 mm stone inferior pole left kidney. 2 cm exophytic cyst mid pole right kidney. Urinary bladder is unremarkable. Stomach/Bowel: Sigmoid colonic diverticulosis. No CT evidence for acute diverticulitis. Normal appendix. Moderate-sized hiatal hernia. Oral contrast material is demonstrated to the mid small bowel located within the central lower abdomen. There are dilated loops of small bowel measuring up to 4 cm within the lower abdomen (image 73; series 2). Within the left lower quadrant there is a loop of small bowel with wall thickening and associated mesenteric fat stranding/edema. This loop of bowel courses through the left inguinal hernia where there is decompressed small bowel distally. No free intraperitoneal air. Vascular/Lymphatic: Normal caliber abdominal aorta. Peripheral calcified atherosclerotic plaque. No retroperitoneal lymphadenopathy. Reproductive: Status post prostatectomy. Other: Small bowel containing and fat containing left inguinal hernia. Fat containing right inguinal hernia. Musculoskeletal: No aggressive or acute appearing osseous lesions. Lower thoracic and lumbar spine degenerative changes. Multilevel lower thoracic and upper lumbar spine kyphoplasty. IMPRESSION: 1. There is a small bowel containing left inguinal hernia. The  small bowel proximal to the hernia is dilated measuring up to 4 cm with associated wall thickening and mesenteric fluid/fat stranding compatible with obstruction and associated inflammation. 2. Patchy consolidative opacities within the left lower lobe most compatible with pneumonia. Recommend follow-up chest radiograph in 6-8 weeks to ensure resolution. 3. These results were called by telephone at the time of interpretation on 03/10/2021 at 10:48 am to provider [facility] , who verbally acknowledged these results. Electronically Signed   By: Lovey Newcomer M.D.   On: 03/10/2021 11:02   DG Chest Portable 1 View  Result Date: 03/10/2021 CLINICAL DATA:  NVD for the past 2 days. Pt awoke this am very weak. Per pts daughter he has these episodes every 2 weeks since October. Pt also has hernia EXAM: PORTABLE CHEST - 1 VIEW COMPARISON:  11/03/2015 FINDINGS: Left perihilar and lower lung airspace opacities, new since previous. The right lower lobe airspace disease seen previously has resolved. Heart size upper limits normal for technique. Aortic Atherosclerosis (ICD10-170.0). Previous CABG. Blunting of left lateral costophrenic angle suggesting small effusion. No pneumothorax. Sternotomy wires. Changes of cement vertebral augmentation in multiple contiguous thoracolumbar vertebral bodies . IMPRESSION: 1. Left perihilar and lower lobe airspace disease suggesting pneumonia. Electronically Signed   By: Lucrezia Europe M.D.   On: 03/10/2021 10:32   '@PROBHOSP'$ @ CT ABDOMEN PELVIS W CONTRAST  Result Date: 03/10/2021 CLINICAL DATA:  Patient with nausea and vomiting for the prior 2 days. History of hernia. EXAM: CT ABDOMEN AND PELVIS WITH CONTRAST TECHNIQUE: Multidetector CT imaging of the abdomen and pelvis was performed using the standard protocol following bolus administration of intravenous contrast. CONTRAST:  85mL OMNIPAQUE IOHEXOL 300 MG/ML  SOLN COMPARISON:  CT abdomen pelvis January 10, 2021. FINDINGS: Lower chest: Normal  heart size.  Patchy consolidative opacities within the left lower lobe and lingula. Minimal ground-glass opacities within the right lower lobe. No pleural effusion Hepatobiliary: Liver is normal in size and contour. Prior cholecystectomy. No intrahepatic or extrahepatic biliary ductal dilatation. Pancreas: Fatty atrophy of the pancreas. Spleen: Unremarkable. Adrenals/Urinary Tract: Normal adrenal glands. Kidneys enhance symmetrically with contrast. 5 mm stone inferior pole left kidney. 2 cm exophytic cyst mid pole right kidney. Urinary bladder is unremarkable. Stomach/Bowel: Sigmoid colonic diverticulosis. No CT evidence for acute diverticulitis. Normal appendix. Moderate-sized hiatal hernia. Oral contrast material is demonstrated to the mid small bowel located within the central lower abdomen. There are dilated loops of small bowel measuring up to 4 cm within the lower abdomen (image 73; series 2). Within the left lower quadrant there is a loop of small bowel with wall thickening and associated mesenteric fat stranding/edema. This loop of bowel courses through the left inguinal hernia where there is decompressed small bowel distally. No free intraperitoneal air. Vascular/Lymphatic: Normal caliber abdominal aorta. Peripheral calcified atherosclerotic plaque. No retroperitoneal lymphadenopathy. Reproductive: Status post prostatectomy. Other: Small bowel containing and fat containing left inguinal hernia. Fat containing right inguinal hernia. Musculoskeletal: No aggressive or acute appearing osseous lesions. Lower thoracic and lumbar spine degenerative changes. Multilevel lower thoracic and upper lumbar spine kyphoplasty. IMPRESSION: 1. There is a small bowel containing left inguinal hernia. The small bowel proximal to the hernia is dilated measuring up to 4 cm with associated wall thickening and mesenteric fluid/fat stranding compatible with obstruction and associated inflammation. 2. Patchy consolidative opacities  within the left lower lobe most compatible with pneumonia. Recommend follow-up chest radiograph in 6-8 weeks to ensure resolution. 3. These results were called by telephone at the time of interpretation on 03/10/2021 at 10:48 am to provider [facility] , who verbally acknowledged these results. Electronically Signed   By: Lovey Newcomer M.D.   On: 03/10/2021 11:02   DG Chest Portable 1 View  Result Date: 03/10/2021 CLINICAL DATA:  NVD for the past 2 days. Pt awoke this am very weak. Per pts daughter he has these episodes every 2 weeks since October. Pt also has hernia EXAM: PORTABLE CHEST - 1 VIEW COMPARISON:  11/03/2015 FINDINGS: Left perihilar and lower lung airspace opacities, new since previous. The right lower lobe airspace disease seen previously has resolved. Heart size upper limits normal for technique. Aortic Atherosclerosis (ICD10-170.0). Previous CABG. Blunting of left lateral costophrenic angle suggesting small effusion. No pneumothorax. Sternotomy wires. Changes of cement vertebral augmentation in multiple contiguous thoracolumbar vertebral bodies . IMPRESSION: 1. Left perihilar and lower lobe airspace disease suggesting pneumonia. Electronically Signed   By: Lucrezia Europe M.D.   On: 03/10/2021 10:32     ASSESSMENT AND PLAN    -Multidisciplinary rounds held today   Acute Hypoxic Respiratory Failure -due to left lower lobe pneumonia  -empiric antibiotics -close monitoring for intubation if necessary  - present on admission  - COVID19 negative 03/10/21  - supplemental O2 during my evaluation nasal canula - will perform infectious workup for pneumonia -Respiratory viral panel -serum fungitell -legionella ab -strep pneumoniae ur AG -Histoplasma Ur Ag -sputum resp cultures -AFB sputum expectorated specimen -sputum cytology  -reviewed pertinent imaging with patient today - ESR -PT/OT for d/c planning  -please encourage patient to use incentive spirometer few times each hour while  hospitalized.      Incarcerated left inguinal hernia Oral contrast material is demonstrated to the mid small bowel located within the central lower abdomen. There are dilated loops  of small bowel measuring up to 4 cm within the lower abdomen (image 73; series 2). Within the left lower quadrant there is a loop of small bowel with wall thickening and associated mesenteric fat stranding/edema. This loop of bowel courses through the left inguinal hernia where there is decompressed small bowel distally. No free intraperitoneal air. - surgery on case - appreciate collaboration - lactate 5.3  Renal Failure-most likely due to ischemia -follow chem 7 -follow UO -continue Foley Catheter-assess need daily    Septic shock  - present on admission   - due to left lowe lobe pneumonia  - requiring levophed for BP support  -use vasopressors to keep MAP>65 -follow ABG and LA -follow up cultures -emperic ABX -consider stress dose steroids   ID -continue IV abx as prescibed -follow up cultures  GI/Nutrition GI PROPHYLAXIS as indicated DIET-->TF's as tolerated Constipation protocol as indicated  ENDO - ICU hypoglycemic\Hyperglycemia protocol -check FSBS per protocol   ELECTROLYTES -follow labs as needed -replace as needed -pharmacy consultation   DVT/GI PRX ordered -SCDs  TRANSFUSIONS AS NEEDED MONITOR FSBS ASSESS the need for LABS as needed   Critical care provider statement:    Critical care time (minutes):  33   Critical care time was exclusive of:  Separately billable procedures and treating other patients   Critical care was necessary to treat or prevent imminent or life-threatening deterioration of the following conditions:  acute hypoxemic respiratory failure, altered mental status with confusion , incarcerated inguinal hernia, AKI, lactic acidosis, multiple comoribod conditosins.    Critical care was time spent personally by me on the following activities:   Development of treatment plan with patient or surrogate, discussions with consultants, evaluation of patient's response to treatment, examination of patient, obtaining history from patient or surrogate, ordering and performing treatments and interventions, ordering and review of laboratory studies and re-evaluation of patient's condition.  I assumed direction of critical care for this patient from another provider in my specialty: no    This document was prepared using Dragon voice recognition software and may include unintentional dictation errors.    Ottie Glazier, M.D.  Division of [place]

## 2021-03-10 NOTE — ED Provider Notes (Signed)
[facility] Emergency Department Provider Note ____________________________________________   Event Date/Time   First MD Initiated Contact with Patient 03/10/21 [phone]     (approximate)  I have reviewed the triage vital signs and the nursing notes.  HISTORY  Chief Complaint Nausea, Diarrhea, and Weakness   HPI Justin [name] is a 80 y.o. malewho presents to the ED for evaluation of N/V/D.   Chart review indicates hx CAD s/p stenting and CABG, CKD, HTN, HLD. Preserved EF. Emphysema.   Patient presents to the ED via EMS from home for evaluation of about 1 day of abdominal pain, recurrent nausea and emesis.  Patient reports inability to keep anything down orally, including Gatorade that he has been using to try to rehydrate, as well as intermittently worsening lower abdominal pain.  He reports having a left inguinal hernia and that he needs to see a Psychologist, sport and exercise..  Reports decreased urinary output without dysuria or hematuria.  Denies fevers, syncope or falls.  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   Wears dentures    partial upper and lower    Patient Active Problem List   Diagnosis Date Noted   Left lower lobe pulmonary infiltrate  03/10/2021   PAD (peripheral artery disease) ([place]) 11/10/2018   Leg pain 11/10/2018   History of compression fracture of spine 07/30/2017   Pneumonia 11/03/2015   NSTEMI (non-ST elevated myocardial infarction) ([place]) 11/03/2015   DDD (degenerative disc disease), lumbar 10/12/2014   Incomplete bladder emptying 02/15/2014   Obstruction of urinary tract 02/15/2014   OP (osteoporosis) 01/21/2014   Esophagitis, reflux 01/21/2014   Osteoporosis 01/21/2014   Fatigue 09/30/2013   Malaise and fatigue 09/30/2013   H/O malignant neoplasm of prostate 12/15/2012   Calculus of kidney 12/09/2012   ED (erectile dysfunction) of organic origin 12/09/2012   CA of prostate ([place]) 12/09/2012   Genuine stress incontinence, male 12/09/2012   Urge incontinence 12/09/2012   Arteriosclerosis of coronary artery 06/17/2011   Atherosclerosis of coronary artery 06/17/2011   HLD (hyperlipidemia) 06/17/2011   Obstructive apnea 06/17/2011   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    Past Surgical History:  Procedure Laterality Date   BACK SURGERY     CARDIAC CATHETERIZATION N/A 11/07/2015   Procedure: Left Heart Cath and Coronary Angiography;  Surgeon: Teodoro Spray, MD;  Location: [place] CV LAB;  Service: Cardiovascular;  Laterality: N/A;   CARDIAC CATHETERIZATION N/A 11/07/2015   Procedure: Coronary Stent Intervention;  Surgeon: Isaias Cowman, MD;  Location: [place] CV LAB;  Service: Cardiovascular;  Laterality: N/A;   CATARACT  EXTRACTION     CHOLECYSTECTOMY     COLONOSCOPY N/A 06/13/2020   Procedure: COLONOSCOPY;  Surgeon: Toledo, Benay Pike, MD;  Location: [facility] ENDOSCOPY;  Service: Gastroenterology;  Laterality: N/A;   COLONOSCOPY WITH PROPOFOL N/A 03/27/2017   Procedure: COLONOSCOPY WITH PROPOFOL;  Surgeon: Manya Silvas, MD;  Location: [facility] ENDOSCOPY;  Service: Endoscopy;  Laterality: N/A;   CORONARY ANGIOPLASTY     CORONARY ARTERY BYPASS GRAFT     ESOPHAGOGASTRODUODENOSCOPY N/A  01/14/2021   Procedure: ESOPHAGOGASTRODUODENOSCOPY (EGD);  Surgeon: Toledo, Benay Pike, MD;  Location: [facility] ENDOSCOPY;  Service: Gastroenterology;  Laterality: N/A;   ESOPHAGOGASTRODUODENOSCOPY (EGD) WITH PROPOFOL N/A 03/27/2017   Procedure: ESOPHAGOGASTRODUODENOSCOPY (EGD) WITH PROPOFOL;  Surgeon: Manya Silvas, MD;  Location: [facility] ENDOSCOPY;  Service: Endoscopy;  Laterality: N/A;   EXCISION PARTIAL PHALANX Right 09/10/2017   Procedure: EXCISION PARTIAL PHALANX-2nd toe;  Surgeon: Albertine Patricia, DPM;  Location: [place];  Service: Podiatry;  Laterality: Right;  sleep apnea   EYE SURGERY     HERNIA REMOVED     HERNIA REPAIR     kissing spine syndrome     KYPHOPLASTY N/A 03/23/2020   Procedure: KYPHOPLASTY L1;  Surgeon: Hessie Knows, MD;  Location: [facility] ORS;  Service: Orthopedics;  Laterality: N/A;   KYPHOPLASTY N/A 05/15/2020   Procedure: T 12 Kyphoplasty;  Surgeon: Hessie Knows, MD;  Location: [facility] ORS;  Service: Orthopedics;  Laterality: N/A;   kyphoplasty t10 t11     LEFT HEART CATH AND CORONARY ANGIOGRAPHY Left 09/28/2018   Procedure: LEFT HEART CATH AND CORONARY ANGIOGRAPHY;  Surgeon: Teodoro Spray, MD;  Location: [place] [facility];  Service: Cardiovascular;  Laterality: Left;   OPEN HEART SURGERY     RETROPUBIC PROSTATECTOMY     SKIN CANCER REMOVED      Prior to Admission medications   Medication Sig Start Date End Date Taking? Authorizing Provider  acetaminophen (TYLENOL) 500 MG tablet Take 1,000 mg by mouth in the morning and at bedtime.    [provider]  albuterol (PROVENTIL HFA;VENTOLIN HFA) 108 (90 Base) MCG/ACT inhaler Inhale 2 puffs into the lungs every 4 (four) hours as needed for wheezing or shortness of breath.    [provider]  aspirin EC 81 MG tablet Take 81 mg by mouth daily.    [provider]  atorvastatin (LIPITOR) 80 MG tablet Take 40 mg by mouth at bedtime.     [provider]  Calcium Carbonate-Vitamin D  600-400 MG-UNIT tablet Take 1 tablet by mouth 2 (two) times daily.    [provider]  cholecalciferol (VITAMIN D) 1000 units tablet Take 3,000 Units by mouth daily.     [provider]  cyanocobalamin 1000 MCG tablet Take 1,000 mcg by mouth at bedtime.     [provider]  fexofenadine (ALLEGRA) 180 MG tablet Take 180 mg by mouth daily before breakfast.     [provider]  fluticasone (FLONASE) 50 MCG/ACT nasal spray Place 2 sprays into both nostrils at bedtime.    [provider]  gabapentin (NEURONTIN) 300 MG capsule Take 300 mg by mouth at bedtime.     [provider]  hydrocortisone (ANUSOL-HC) 25 MG suppository Place 25 mg rectally 2 (two) times daily as needed for hemorrhoids or anal itching.     [provider]  ibuprofen (ADVIL) 200 MG tablet Take 400 mg by mouth 3 (three) times daily.    [provider]  isosorbide mononitrate (IMDUR) 60 MG 24  hr tablet Take 60 mg by mouth daily.    [provider]  Magnesium 400 MG CAPS Take 400 mg by mouth 2 (two) times daily.     [provider]  metoprolol tartrate (LOPRESSOR) 25 MG tablet Take 25 mg by mouth 2 (two) times daily.     [provider]  Multiple Vitamins-Minerals (EMERGEN-C VITAMIN C) PACK Take 1 packet by mouth daily.    [provider]  niacin 500 MG tablet Take 500 mg by mouth 3 (three) times daily.     [provider]  nitroGLYCERIN (NITROSTAT) 0.4 MG SL tablet Place 0.4 mg under the tongue every 5 (five) minutes x 3 doses as needed for chest pain. If no relief call MD or go to emergency room.    [provider]  Omega-3 Fatty Acids (FISH OIL) 1000 MG CAPS Take 1,000 mg by mouth 2 (two) times daily.    [provider]  omeprazole (PRILOSEC) 40 MG capsule Take 1 capsule by mouth 2 (two) times daily. 10/02/20   [provider]  potassium chloride SA (K-DUR,KLOR-CON) 20 MEQ tablet Take 20 mEq by  mouth 2 (two) times daily.     [provider]  Probiotic CAPS Take 2 capsules by mouth at bedtime.    [provider]  senna-docusate (SENOKOT-S) 8.6-50 MG tablet Take 2 tablets by mouth 2 (two) times daily.    [provider]  sucralfate (CARAFATE) 1 g tablet Take 1 g by mouth 4 (four) times daily. 09/27/20   [provider]  TART CHERRY PO Take 1 capsule by mouth daily.    [provider]  Tiotropium Bromide-Olodaterol 2.5-2.5 MCG/ACT AERS Inhale 2 puffs into the lungs daily.    [provider]  torsemide (DEMADEX) 20 MG tablet Take 60 mg by mouth daily.     [provider]  Wheat Dextrin (BENEFIBER DRINK MIX PO) Take 1 Dose by mouth daily.     [provider]    Allergies Hydrochlorothiazide w-triamterene  Family History  Problem Relation Age of Onset   Cancer Father    Prostate cancer Father    Bladder Cancer Neg Hx    Kidney cancer Neg Hx     Social History Social History   Tobacco Use   Smoking status: Former    Years: 35.00    Pack years: 0.00    Types: Cigarettes    Quit date: 06/26/1990    Years since quitting: 30.7   Smokeless tobacco: Never  Vaping Use   Vaping Use: Never used  Substance Use Topics   Alcohol use: No    Alcohol/week: 0.0 standard drinks   Drug use: No    Review of Systems  Constitutional: No fever/chills Eyes: No visual changes. ENT: No sore throat. Cardiovascular: Denies chest pain. Respiratory: Denies shortness of breath. Gastrointestinal: Positive for lower abdominal pain, nausea vomiting.  No constipation. Genitourinary: Negative for dysuria. Musculoskeletal: Negative for back pain. Skin: Negative for rash. Neurological: Negative for headaches, focal weakness or numbness.  ____________________________________________   PHYSICAL EXAM:  VITAL SIGNS: Vitals:   03/10/21 1225 03/10/21 1255  BP: 109/76 100/72  Pulse: 95 96  Resp: 18 (!) 22  Temp:    SpO2:  94% 94%     Constitutional: Alert and oriented. Well appearing and in no acute distress. Eyes: Conjunctivae are normal. PERRL. EOMI. Head: Atraumatic. Nose: No congestion/rhinnorhea. Mouth/Throat: Mucous membranes are moist.  Oropharynx non-erythematous. Neck: No stridor. No cervical spine tenderness to  palpation. Cardiovascular: Normal rate, regular rhythm. Grossly normal heart sounds.  Good peripheral circulation. Respiratory: Normal respiratory effort.  No retractions. Lungs CTAB. Gastrointestinal: Soft , nondistended. No CVA tenderness. Lower abdominal tenderness to palpation.  Lesser tenderness to his upper abdomen. Genitourinary: Soft and irreducible mass to his left pubis extending towards the scrotum. Musculoskeletal: No lower extremity tenderness nor edema.  No joint effusions. No signs of acute trauma. Neurologic:  Normal speech and language. No gross focal neurologic deficits are appreciated. No gait instability noted. Skin:  Skin is warm, dry and intact. No rash noted. Psychiatric: Mood and affect are normal. Speech and behavior are normal.  ____________________________________________   LABS (all labs ordered are listed, but only abnormal results are displayed)  Labs Reviewed  BASIC METABOLIC PANEL - Abnormal; Notable for the following components:      Result Value   Glucose, Bld 138 (*)    Creatinine, Ser 2.10 (*)    GFR, Estimated 31 (*)    All other components within normal limits  CBC - Abnormal; Notable for the following components:   WBC 3.8 (*)    RBC 3.99 (*)    Hemoglobin 12.9 (*)    HCT 38.7 (*)    Platelets 105 (*)    All other components within normal limits  LACTIC ACID, PLASMA - Abnormal; Notable for the following components:   Lactic Acid, Venous 5.3 (*)    All other components within normal limits  LACTIC ACID, PLASMA - Abnormal; Notable for the following components:   Lactic Acid, Venous 4.1 (*)    All other components within normal limits   RESP PANEL BY RT-PCR (FLU A&B, COVID) ARPGX2  RESPIRATORY PANEL BY PCR  EXPECTORATED SPUTUM ASSESSMENT W GRAM STAIN, RFLX TO RESP C  URINALYSIS, COMPLETE (UACMP) WITH MICROSCOPIC  LEGIONELLA PNEUMOPHILA SEROGP 1 UR AG  STREP PNEUMONIAE URINARY ANTIGEN   ____________________________________________  12 Lead EKG  Sinus rhythm, rate of 93 bpm.  Normal axis.  First-degree AV block with PR interval of 218 ms and otherwise normal intervals.  No STEMI. ____________________________________________  RADIOLOGY  ED MD interpretation: CT reviewed by me with incarcerated left inguinal hernia. CXR reviewed by me with left basilar infiltration  Official radiology report(s): CT ABDOMEN PELVIS W CONTRAST  Result Date: 03/10/2021 CLINICAL DATA:  Patient with nausea and vomiting for the prior 2 days. History of hernia. EXAM: CT ABDOMEN AND PELVIS WITH CONTRAST TECHNIQUE: Multidetector CT imaging of the abdomen and pelvis was performed using the standard protocol following bolus administration of intravenous contrast. CONTRAST:  37mL OMNIPAQUE IOHEXOL 300 MG/ML  SOLN COMPARISON:  CT abdomen pelvis January 10, 2021. FINDINGS: Lower chest: Normal heart size. Patchy consolidative opacities within the left lower lobe and lingula. Minimal ground-glass opacities within the right lower lobe. No pleural effusion Hepatobiliary: Liver is normal in size and contour. Prior cholecystectomy. No intrahepatic or extrahepatic biliary ductal dilatation. Pancreas: Fatty atrophy of the pancreas. Spleen: Unremarkable. Adrenals/Urinary Tract: Normal adrenal glands. Kidneys enhance symmetrically with contrast. 5 mm stone inferior pole left kidney. 2 cm exophytic cyst mid pole right kidney. Urinary bladder is unremarkable. Stomach/Bowel: Sigmoid colonic diverticulosis. No CT evidence for acute diverticulitis. Normal appendix. Moderate-sized hiatal hernia. Oral contrast material is demonstrated to the mid small bowel located within the  central lower abdomen. There are dilated loops of small bowel measuring up to 4 cm within the lower abdomen (image 73; series 2). Within the left lower quadrant there is a loop of small bowel with wall  thickening and associated mesenteric fat stranding/edema. This loop of bowel courses through the left inguinal hernia where there is decompressed small bowel distally. No free intraperitoneal air. Vascular/Lymphatic: Normal caliber abdominal aorta. Peripheral calcified atherosclerotic plaque. No retroperitoneal lymphadenopathy. Reproductive: Status post prostatectomy. Other: Small bowel containing and fat containing left inguinal hernia. Fat containing right inguinal hernia. Musculoskeletal: No aggressive or acute appearing osseous lesions. Lower thoracic and lumbar spine degenerative changes. Multilevel lower thoracic and upper lumbar spine kyphoplasty. IMPRESSION: 1. There is a small bowel containing left inguinal hernia. The small bowel proximal to the hernia is dilated measuring up to 4 cm with associated wall thickening and mesenteric fluid/fat stranding compatible with obstruction and associated inflammation. 2. Patchy consolidative opacities within the left lower lobe most compatible with pneumonia. Recommend follow-up chest radiograph in 6-8 weeks to ensure resolution. 3. These results were called by telephone at the time of interpretation on 03/10/2021 at 10:48 am to provider [facility] , who verbally acknowledged these results. Electronically Signed   By: Lovey Newcomer M.D.   On: 03/10/2021 11:02   DG Chest Portable 1 View  Result Date: 03/10/2021 CLINICAL DATA:  NVD for the past 2 days. Pt awoke this am very weak. Per pts daughter he has these episodes every 2 weeks since October. Pt also has hernia EXAM: PORTABLE CHEST - 1 VIEW COMPARISON:  11/03/2015 FINDINGS: Left perihilar and lower lung airspace opacities, new since previous. The right lower lobe airspace disease seen previously has resolved. Heart  size upper limits normal for technique. Aortic Atherosclerosis (ICD10-170.0). Previous CABG. Blunting of left lateral costophrenic angle suggesting small effusion. No pneumothorax. Sternotomy wires. Changes of cement vertebral augmentation in multiple contiguous thoracolumbar vertebral bodies . IMPRESSION: 1. Left perihilar and lower lobe airspace disease suggesting pneumonia. Electronically Signed   By: Lucrezia Europe M.D.   On: 03/10/2021 10:32    ____________________________________________   PROCEDURES and INTERVENTIONS  Procedure(s) performed (including Critical Care):  .1-3 Lead EKG Interpretation  Date/Time: 03/10/2021 10:00 AM Performed by: Vladimir Crofts, MD Authorized by: Vladimir Crofts, MD     Interpretation: normal     ECG rate:  80   ECG rate assessment: normal     Rhythm: sinus rhythm     Ectopy: none     Conduction: normal   Ultrasound ED Peripheral IV (Provider)  Date/Time: 03/10/2021 10:00 AM Performed by: Vladimir Crofts, MD Authorized by: Vladimir Crofts, MD   Procedure details:    Indications: hypotension     Skin Prep: chlorhexidine gluconate     Location: right basilic v.   Angiocath:  20 G   Bedside Ultrasound Guided: Yes     Images: not archived     Patient tolerated procedure without complications: Yes     Dressing applied: Yes   .Critical Care  Date/Time: 03/10/2021 10:00 AM Performed by: Vladimir Crofts, MD Authorized by: Vladimir Crofts, MD   Critical care provider statement:    Critical care time (minutes):  75   Critical care was necessary to treat or prevent imminent or life-threatening deterioration of the following conditions:  Dehydration, shock and respiratory failure   Critical care was time spent personally by me on the following activities:  Discussions with consultants, evaluation of patient's response to treatment, examination of patient, ordering and performing treatments and interventions, ordering and review of laboratory studies, ordering and review  of radiographic studies, pulse oximetry, re-evaluation of patient's condition, obtaining history from patient or surrogate and review of old charts  Medications  norepinephrine (LEVOPHED) 4mg  in 2110mL premix infusion (7 mcg/min Intravenous Rate/Dose Change 03/10/21 1244)  docusate sodium (COLACE) capsule 100 mg (has no administration in time range)  polyethylene glycol (MIRALAX / GLYCOLAX) packet 17 g (has no administration in time range)  pantoprazole (PROTONIX) injection 40 mg (has no administration in time range)  0.9 %  sodium chloride infusion (250 mLs Intravenous New Bag/Given 03/10/21 1256)  lactated ringers bolus 1,000 mL (0 mLs Intravenous Stopped 03/10/21 1058)  ondansetron (ZOFRAN) injection 4 mg (4 mg Intravenous Given 03/10/21 0918)  iohexol (OMNIPAQUE) 9 MG/ML oral solution 1,000 mL (1,000 mLs Oral Contrast Given 03/10/21 0926)  sodium chloride 0.9 % bolus 1,000 mL (0 mLs Intravenous Stopped 03/10/21 1129)  iohexol (OMNIPAQUE) 300 MG/ML solution 75 mL (75 mLs Intravenous Contrast Given 03/10/21 0956)  piperacillin-tazobactam (ZOSYN) IVPB 3.375 g (0 g Intravenous Stopped 03/10/21 1258)  metroNIDAZOLE (FLAGYL) IVPB 500 mg (0 mg Intravenous Stopped 03/10/21 1129)    ____________________________________________   MDM / ED COURSE   80 year old male presents to the ED with GI symptoms, found to have evidence of septic shock attributable to CAP as well as incarcerated left inguinal hernia precipitating SBO, requiring ICU admission and likely surgical intervention.  Presents hypotensive, transiently improving with IV fluids, but ultimately necessitating peripheral vasopressors with about 5 mics per minute of Levophed.  Requiring nasal cannula for normoxia.  Blood work with elevated lactic acid, improving with resuscitation and antibiotics.  CT imaging concerning for SBO in the setting of left inguinal hernia, then I am unable to reduce on examination.  ICU agrees to admit and surgery plans to take  to the OR later today.  Remained stable on small doses of Levophed at this time.  No evidence of ACS.  Clinical Course as of 03/10/21 1320  Sun Mar 10, 2021  0913 USIV placed by me. Pressures improving w fluids, MAP 66 [DS]  0936 Reassessed.  Patient looks improved.  Sitting up in bed and drinking IV fluids. [DS]  1026 reasessed [DS]  2620 Call from rads regarding CT findings [DS]  1054 Updated patient and wife on diagnosis of pneumonia and incarcerated hernia causing bowel obstruction.  We discussed the possible need for surgery.  Answered questions. [DS]  1107 Paged surgery for a 2nd time [DS]  1109 Call from rotator RN from [place], Dr. Dahlia Byes is in a case. While I'm relaying patient information, she suddenly says she has to go and hangs up [DS]  [place] informs me of hypotension despite 2L IVF. We'll start levophed [DS]  1200 Surgery paged again [DS]  1205 Spoke with Dr. Lanney Gins who agrees to see patient for admission [DS]  1318 I speak with Dr. Dahlia Byes, who recommends NGT placement and he will add to OR schedule for later today [DS]    Clinical Course User Index [DS] Vladimir Crofts, MD    ____________________________________________   FINAL CLINICAL IMPRESSION(S) / ED DIAGNOSES  Final diagnoses:  Septic shock ([place])  Community acquired pneumonia of left lower lobe of lung  Incarcerated left inguinal hernia  SBO (small bowel obstruction) [facility])     ED Discharge Orders     None        Justin [name]   Note:  This document was prepared using Dragon voice recognition software and may include unintentional dictation errors.    Vladimir Crofts, MD 03/10/21 1322

## 2021-03-11 ENCOUNTER — Inpatient Hospital Stay: Payer: Medicare Other

## 2021-03-11 DIAGNOSIS — J189 Pneumonia, unspecified organism: Secondary | ICD-10-CM

## 2021-03-11 DIAGNOSIS — K4090 Unilateral inguinal hernia, without obstruction or gangrene, not specified as recurrent: Secondary | ICD-10-CM

## 2021-03-11 LAB — BASIC METABOLIC PANEL
Anion gap: 8 (ref 5–15)
BUN: 23 mg/dL (ref 8–23)
CO2: 28 mmol/L (ref 22–32)
Calcium: 8.3 mg/dL — ABNORMAL LOW (ref 8.9–10.3)
Chloride: 101 mmol/L (ref 98–111)
Creatinine, Ser: 1.54 mg/dL — ABNORMAL HIGH (ref 0.61–1.24)
GFR, Estimated: 46 mL/min — ABNORMAL LOW (ref >60)
Glucose, Bld: 91 mg/dL (ref 70–99)
Potassium: 3.5 mmol/L (ref 3.5–5.1)
Sodium: 137 mmol/L (ref 135–145)

## 2021-03-11 LAB — CBC
HCT: 36.7 % — ABNORMAL LOW (ref 39.0–52.0)
Hemoglobin: 11.8 g/dL — ABNORMAL LOW (ref 13.0–17.0)
MCH: 32 pg (ref 26.0–34.0)
MCHC: 32.2 g/dL (ref 30.0–36.0)
MCV: 99.5 fL (ref 80.0–100.0)
Platelets: 76 10*3/uL — ABNORMAL LOW (ref 150–400)
RBC: 3.69 MIL/uL — ABNORMAL LOW (ref 4.22–5.81)
RDW: 14 % (ref 11.5–15.5)
WBC: 14 10*3/uL — ABNORMAL HIGH (ref 4.0–10.5)
nRBC: 0 % (ref 0.0–0.2)

## 2021-03-11 LAB — MAGNESIUM: Magnesium: 1.9 mg/dL (ref 1.7–2.4)

## 2021-03-11 LAB — PHOSPHORUS: Phosphorus: 3.8 mg/dL (ref 2.5–4.6)

## 2021-03-11 LAB — LACTIC ACID, PLASMA
Lactic Acid, Venous: 2.1 mmol/L (ref 0.5–1.9)
Lactic Acid, Venous: 2.4 mmol/L (ref 0.5–1.9)

## 2021-03-11 MED ORDER — ACETAMINOPHEN 325 MG PO TABS
650.0000 mg | ORAL_TABLET | Freq: Four times a day (QID) | ORAL | Status: DC | PRN
  Administered 2021-03-11 – 2021-03-14 (×4): 650 mg via ORAL
  Filled 2021-03-11 (×4): qty 2

## 2021-03-11 MED ORDER — ENOXAPARIN SODIUM 40 MG/0.4ML IJ SOSY
40.0000 mg | PREFILLED_SYRINGE | INTRAMUSCULAR | Status: DC
  Administered 2021-03-11 – 2021-03-14 (×4): 40 mg via SUBCUTANEOUS
  Filled 2021-03-11 (×4): qty 0.4

## 2021-03-11 MED ORDER — ALUM & MAG HYDROXIDE-SIMETH 200-200-20 MG/5ML PO SUSP
30.0000 mL | ORAL | Status: DC | PRN
  Administered 2021-03-11 – 2021-03-12 (×3): 30 mL via ORAL
  Filled 2021-03-11 (×3): qty 30

## 2021-03-11 MED ORDER — MELATONIN 5 MG PO TABS
2.5000 mg | ORAL_TABLET | Freq: Every evening | ORAL | Status: DC | PRN
  Administered 2021-03-11: 2.5 mg via ORAL
  Filled 2021-03-11: qty 0.5
  Filled 2021-03-11: qty 1

## 2021-03-11 MED ORDER — FAMOTIDINE 20 MG PO TABS
20.0000 mg | ORAL_TABLET | Freq: Two times a day (BID) | ORAL | Status: DC
  Administered 2021-03-11 – 2021-03-15 (×9): 20 mg via ORAL
  Filled 2021-03-11 (×9): qty 1

## 2021-03-11 NOTE — Progress Notes (Signed)
[facility] SURGICAL PROGRESS NOTE (cpt [phone])  Hospital Day(s): 1.   Post op day(s): 1 Day Post-Op.   Interval History: Patient seen and examined, no acute events or new complaints overnight. Patient reports he continues to do well, very hungry. He denies fever, chills, abdominal pain, nausea, or emesis. Lactic acidosis continues to improve, down to 2.1. He has been NPO since admission. No other complaints this morning.    Review of Systems:  Constitutional: denies fever, chills  HEENT: denies cough or congestion  Respiratory: denies any shortness of breath  Cardiovascular: denies chest pain or palpitations  Gastrointestinal: + left inguinal hernia (reduced), denies abdominal pain, N/V, or diarrhea/and bowel function as per interval history Genitourinary: denies burning with urination or urinary frequency  Vital signs in last 24 hours: [min-max] current  Temp:  [98.3 F (36.8 C)-98.5 F (36.9 C)] 98.4 F (36.9 C) (06/20 0400) Pulse Rate:  [29-143] 87 (06/20 0715) Resp:  [12-30] 13 (06/20 0715) BP: (65-134)/(38-110) 117/54 (06/20 0715) SpO2:  [88 %-100 %] 96 % (06/20 0715) Weight:  [70.3 kg-81.4 kg] 80.3 kg (06/20 0457)     Height: 5\' 8"  (172.7 cm) Weight: 80.3 kg BMI (Calculated): 26.92   Intake/Output last 2 shifts:  06/19 0701 - 06/20 0700 In: 2743.1 [I.V.:627; IV Piggyback:2116] Out: 1900 [Urine:1900]   Physical Exam:  Constitutional: alert, cooperative and no distress  HENT: normocephalic without obvious abnormality  Eyes: PERRL, EOM's grossly intact and symmetric  Respiratory: breathing non-labored at rest, on [place] Cardiovascular: regular rate and sinus rhythm  Gastrointestinal: Soft, non-tender, non-distended, no rebound/guarding. No recurrence of left inguinal hernia  Genitourinary; Foley in place  Musculoskeletal: no edema or wounds, motor and sensation grossly intact, NT    Labs:  CBC Latest Ref Rng & Units 03/10/2021 03/23/2020 11/08/2015  WBC 4.0 -  10.5 K/uL 3.8(L) 7.0 7.3  Hemoglobin 13.0 - 17.0 g/dL 12.9(L) 13.0 12.9(L)  Hematocrit 39.0 - 52.0 % 38.7(L) 38.9(L) 38.1(L)  Platelets 150 - 400 K/uL 105(L) 184 141(L)   CMP Latest Ref Rng & Units 03/10/2021 03/23/2020 06/11/2017  Glucose 70 - 99 mg/dL 138(H) 96 -  BUN 8 - 23 mg/dL 21 24(H) -  Creatinine 0.61 - 1.24 mg/dL 2.10(H) 1.41(H) 1.00  Sodium 135 - 145 mmol/L 136 138 -  Potassium 3.5 - 5.1 mmol/L 4.1 4.2 -  Chloride 98 - 111 mmol/L 99 98 -  CO2 22 - 32 mmol/L 25 26 -  Calcium 8.9 - 10.3 mg/dL 9.4 9.0 -     Imaging studies: No new pertinent imaging studies   Assessment/Plan: (ICD-10's: K40.30) 80 y.o. male admitted with hypoxic respiratory failure secondary to pneumonia, found to have, now reduced, left inguinal hernia.    - Hernia remains reduced this morning and he is without complaints. No indication for emergent surgical intervention. He will benefit from consideration of elective repair in the outpatient setting in 3-4 once recovered from his acute illness.   - I think it is reasonable to initiate CLD - Monitor abdominal examination; on-going bowel function - Pain control prn; antiemetics prn    - Further management per primary service; we will follow    All of the above findings and recommendations were discussed with the patient, and the medical team, and all of patient's questions were answered to his expressed satisfaction.  -- [name], PA-C [facility] 03/11/2021, 7:34 AM [phone] M-F: 7am - 4pm

## 2021-03-11 NOTE — Progress Notes (Signed)
NAME:  Justin [name], MRN:  [id number], DOB:  12-Aug-1941, LOS: 1 ADMISSION DATE:  03/10/2021, CONSULTATION DATE:  03/11/2021 REFERRING MD:  EDP , CHIEF COMPLAINT: Inguinal hernia, shock  History of Present Illness:  80yo m with NSTEMI, OSA, GERD, CAD, osteoporosis, basal cell ca, BPH with prostate CA, CKD, severe claudication, dyslipidemia, Emphysema who came to ED with complaints of  acute onset of dyspnea, vomiting and abd pain who came in due to abd pain. He was found to be severely hypotensive in circulatory shock and further had inguinal hernia which was incarcerated on right and had left sided pneumonia on CT chest.  Pertinent  Medical History    has a past medical history of Adenomatous polyps, Anginal pain ([place]), Arthritis, Baastrup's syndrome, BPH (benign prostatic hyperplasia), CAD (coronary artery disease), Cataract, Chronic kidney disease, COPD (chronic obstructive pulmonary disease) ([place]), DJD (degenerative joint disease), Does use hearing aid, Dyspnea, Dysrhythmia, GERD (gastroesophageal reflux disease), History of kidney stones, History of open heart surgery, Hypertension, Myocardial infarction [facility]), Osteoporosis, Prostate cancer ([place]), Skin cancer, Sleep apnea, 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   Significant Hospital Events: Including procedures, antibiotic start and stop dates in addition to other pertinent events   6/19 Admit. Inguinal hernia is reduced.  Surgery canceled plans for OR  Interim History / Subjective:   No acute events overnight.  Remains on Levophed which is being done Complains of heartburn.  Objective   Blood pressure 123/65, pulse 83, temperature 97.9 F (36.6 C), temperature source Oral, resp. rate 20, height 5\' 8"  (1.727 m), weight 80.3 kg, SpO2 96 %.        Intake/Output Summary (Last 24 hours) at 03/11/2021 1110 Last data filed at 03/11/2021 0400 Gross per 24 hour  Intake  1743.05 ml  Output 1900 ml  Net -156.95 ml   Filed Weights   03/10/21 0831 03/10/21 1329 03/11/21 0457  Weight: 70.3 kg 81.4 kg 80.3 kg    Examination: Blood pressure 123/65, pulse 83, temperature 97.9 F (36.6 C), temperature source Oral, resp. rate 20, height 5\' 8"  (1.727 m), weight 80.3 kg, SpO2 96 %. Gen:      No acute distress, frail, elderly HEENT:  EOMI, sclera anicteric Neck:     No masses; no thyromegaly Lungs:    Clear to auscultation bilaterally; normal respiratory effort CV:         Regular rate and rhythm; no murmurs Abd:      + bowel sounds; soft, non-tender; no palpable masses, no distension Ext:    No edema; adequate peripheral perfusion Skin:      Warm and dry; no rash Neuro: alert and oriented x 3 Psych: normal mood and affect   Labs/imaging that I havepersonally reviewed  (right click and "Reselect all SmartList Selections" daily)   BUN/creatinine 23/1.54, WBC 14, hemoglobin 9.8, platelets 76  Resolved Hospital Problem list     Assessment & Plan:  Hypoxic respiratory failure secondary to pneumonia Continue Zosyn, Flagyl Wean down oxygen as tolerated  Shock secondary to sepsis present on admission Wean down Levophed as tolerated  Incarcerated inguinal hernia.   Has reduced spontaneously No plans for OR by surgery Start PPI and Maalox for GERD  Best Practice (right click and "Reselect all SmartList Selections" daily)   Diet/type: Regular consistency (see orders) Pain/Anxiety/Delirium protocol Not indicated VAP protocol (if indicated): Not indicated DVT prophylaxis: LMWH GI prophylaxis: H2B Glucose control:  not indicated Central venous access:  N/A Arterial line:  N/A Foley:  N/A Mobility:  bed rest  PT consulted: N/A Studies pending: None Culture data pending:none Last reviewed culture data:today Antibiotics:not indicated  Antibiotic de-escalation: no,  continue current rx Stop date: to be determined  Daily labs: requested Code Status:   full code Last date of multidisciplinary goals of care discussion []  ccm prognosis: Serious Disposition: remains critically ill, will stay in intensive care  Critical care time:    The patient is critically ill with multiple organ system failure and requires high complexity decision making for assessment and support, frequent evaluation and titration of therapies, advanced monitoring, review of radiographic studies and interpretation of complex data.   Critical Care Time devoted to patient care services, exclusive of separately billable procedures, described in this note is 45 minutes.   Marshell Garfinkel MD [facility] See Amion for pager  If no response to pager , please call [phone] until 7pm After 7:00 pm call Elink  [phone] 03/11/2021, 11:22 AM

## 2021-03-11 NOTE — Plan of Care (Signed)

## 2021-03-11 NOTE — Progress Notes (Signed)
PHARMACY CONSULT NOTE  Pharmacy Consult for Electrolyte Monitoring and Replacement   Recent Labs: Potassium (mmol/L)  Date Value  03/11/2021 3.5   Magnesium (mg/dL)  Date Value  03/11/2021 1.9   Calcium (mg/dL)  Date Value  03/11/2021 8.3 (L)   Phosphorus (mg/dL)  Date Value  03/11/2021 3.8   Sodium (mmol/L)  Date Value  03/11/2021 137    Assessment: 80 y.o. male with PMH of Baastrup's syndrome, BPH, CAD, CKD, COPD, DJD, prostate cancer who presents to the ED for evaluation of N/V/D found to have, now reduced, left inguinal hernia.  Goal of Therapy:  Electrolytes WNL  Plan:  No electrolyte replacement warranted for today Re-check electrolytes in am  Dallie Piles ,PharmD Clinical Pharmacist 03/11/2021 8:17 AM

## 2021-03-12 ENCOUNTER — Inpatient Hospital Stay: Payer: Medicare Other

## 2021-03-12 DIAGNOSIS — R918 Other nonspecific abnormal finding of lung field: Secondary | ICD-10-CM | POA: Diagnosis not present

## 2021-03-12 LAB — BASIC METABOLIC PANEL
Anion gap: 10 (ref 5–15)
BUN: 19 mg/dL (ref 8–23)
CO2: 23 mmol/L (ref 22–32)
Calcium: 8.5 mg/dL — ABNORMAL LOW (ref 8.9–10.3)
Chloride: 106 mmol/L (ref 98–111)
Creatinine, Ser: 1.17 mg/dL (ref 0.61–1.24)
GFR, Estimated: >60 mL/min (ref >60)
Glucose, Bld: 77 mg/dL (ref 70–99)
Potassium: 3.6 mmol/L (ref 3.5–5.1)
Sodium: 139 mmol/L (ref 135–145)

## 2021-03-12 LAB — CBC
HCT: 35.7 % — ABNORMAL LOW (ref 39.0–52.0)
Hemoglobin: 11.8 g/dL — ABNORMAL LOW (ref 13.0–17.0)
MCH: 32 pg (ref 26.0–34.0)
MCHC: 33.1 g/dL (ref 30.0–36.0)
MCV: 96.7 fL (ref 80.0–100.0)
Platelets: 64 10*3/uL — ABNORMAL LOW (ref 150–400)
RBC: 3.69 MIL/uL — ABNORMAL LOW (ref 4.22–5.81)
RDW: 14.1 % (ref 11.5–15.5)
WBC: 14.4 10*3/uL — ABNORMAL HIGH (ref 4.0–10.5)
nRBC: 0 % (ref 0.0–0.2)

## 2021-03-12 LAB — MAGNESIUM: Magnesium: 2.2 mg/dL (ref 1.7–2.4)

## 2021-03-12 LAB — PHOSPHORUS: Phosphorus: 2.5 mg/dL (ref 2.5–4.6)

## 2021-03-12 LAB — LACTIC ACID, PLASMA: Lactic Acid, Venous: 2 mmol/L (ref 0.5–1.9)

## 2021-03-12 MED ORDER — LACTATED RINGERS IV SOLN: INTRAVENOUS | Status: DC

## 2021-03-12 NOTE — Progress Notes (Signed)
[place] for Electrolyte Monitoring and Replacement   Recent Labs: Potassium (mmol/L)  Date Value  03/12/2021 3.6   Magnesium (mg/dL)  Date Value  03/12/2021 2.2   Calcium (mg/dL)  Date Value  03/12/2021 8.5 (L)   Phosphorus (mg/dL)  Date Value  03/12/2021 2.5   Sodium (mmol/L)  Date Value  03/12/2021 139    Assessment: 80 y.o. male with PMH of Baastrup's syndrome, BPH, CAD, CKD, COPD, DJD, prostate cancer who presents to the ED for evaluation of N/V/D found to have, now reduced, left inguinal hernia.  Goal of Therapy:  Electrolytes WNL  Plan:  No electrolyte replacement warranted for today Re-check electrolytes in am  Lorna Dibble ,PharmD Clinical Pharmacist 03/12/2021 9:27 AM

## 2021-03-12 NOTE — Progress Notes (Signed)
NAME:  Justin [name], MRN:  [id number], DOB:  18-Jul-1941, LOS: 2 ADMISSION DATE:  03/10/2021, CONSULTATION DATE:  03/11/2021 REFERRING MD:  EDP , CHIEF COMPLAINT: Inguinal hernia, shock  History of Present Illness:  80yo m with NSTEMI, OSA, GERD, CAD, osteoporosis, basal cell ca, BPH with prostate CA, CKD, severe claudication, dyslipidemia, Emphysema who came to ED with complaints of  acute onset of dyspnea, vomiting and abd pain who came in due to abd pain. He was found to be severely hypotensive in circulatory shock and further had inguinal hernia which was incarcerated on right and had left sided pneumonia on CT chest.  Pertinent  Medical History    has a past medical history of Adenomatous polyps, Anginal pain ([place]), Arthritis, Baastrup's syndrome, BPH (benign prostatic hyperplasia), CAD (coronary artery disease), Cataract, Chronic kidney disease, COPD (chronic obstructive pulmonary disease) ([place]), DJD (degenerative joint disease), Does use hearing aid, Dyspnea, Dysrhythmia, GERD (gastroesophageal reflux disease), History of kidney stones, History of open heart surgery, Hypertension, Myocardial infarction [facility]), Osteoporosis, Prostate cancer ([place]), Skin cancer, Sleep apnea, 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   Significant Hospital Events: Including procedures, antibiotic start and stop dates in addition to other pertinent events   6/19 Admit. Inguinal hernia is reduced.  Surgery canceled plans for OR 6/21 requiring low-dose Levophed  Interim History / Subjective:   No acute events overnight.  Remains on low-dose Levophed  Objective   Blood pressure 117/64, pulse 74, temperature 97.9 F (36.6 C), temperature source Oral, resp. rate 20, height 5\' 8"  (1.727 m), weight 84.3 kg, SpO2 97 %.        Intake/Output Summary (Last 24 hours) at 03/12/2021 0851 Last data filed at 03/12/2021 0600 Gross per 24 hour  Intake  370.25 ml  Output 3260 ml  Net -2889.75 ml   Filed Weights   03/10/21 1329 03/11/21 0457 03/12/21 0500  Weight: 81.4 kg 80.3 kg 84.3 kg    Examination: Gen:      No acute distress, frail, elderly HEENT:  EOMI, sclera anicteric Neck:     No masses; no thyromegaly Lungs:    Bibasal crackles, diminished breath sounds on the left CV:         Regular rate and rhythm; no murmurs Abd:      + bowel sounds; soft, non-tender; no palpable masses, no distension Ext:    No edema; adequate peripheral perfusion Skin:      Warm and dry; no rash Neuro: alert and oriented x 3 Psych: normal mood and affect   Labs/imaging that I havepersonally reviewed  (right click and "Reselect all SmartList Selections" daily)   Creatinine improved to 1.17, lactic acid remains elevated at 2.4 WBC 14.4, platelets 64 Chest x-ray with left lower lobe infiltrate and effusion  Resolved Hospital Problem list     Assessment & Plan:  Hypoxic respiratory failure secondary to pneumonia Continue Zosyn.  Can stop Flagyl Chest x-ray reviewed with slightly worsened left effusion.  We will continue monitoring.  May need assessment with ultrasound for thoracentesis  Shock secondary to sepsis present on admission Wean down Levophed as tolerated Give additional IV fluids, start LR infusion Recheck lactic acid  Incarcerated inguinal hernia.   Has reduced spontaneously No plans for OR by surgery PPI and Maalox for GERD  Patient and daughter updated at bedside  Best Practice (right click and "Reselect all SmartList Selections" daily)   Diet/type: Regular consistency (see orders) Pain/Anxiety/Delirium protocol  Not indicated VAP protocol (if indicated): Not indicated DVT prophylaxis: LMWH GI prophylaxis: H2B Glucose control:  not indicated Central venous access:  N/A Arterial line:  N/A Foley:  Yes, and it is no longer needed and removal ordered  Mobility:  bed rest  PT consulted: N/A Studies pending:  None Culture data pending:none Last reviewed culture data:today Antibiotics:zosyn and flagyl  Antibiotic de-escalation: yes, de-escalation ordered.  Stop date: to be determined  Daily labs: requested Code Status:  full code Last date of multidisciplinary goals of care discussion []  ccm prognosis: Serious Disposition: remains critically ill, will stay in intensive care  Critical care time:    The patient is critically ill with multiple organ system failure and requires high complexity decision making for assessment and support, frequent evaluation and titration of therapies, advanced monitoring, review of radiographic studies and interpretation of complex data.   Critical Care Time devoted to patient care services, exclusive of separately billable procedures, described in this note is 35 minutes.   Marshell Garfinkel MD [place] [facility] See Amion for pager  If no response to pager , please call [phone] until 7pm After 7:00 pm call Elink  [phone] 03/12/2021, 8:56 AM

## 2021-03-12 NOTE — Progress Notes (Signed)
Pt weaned from 4L to 2L [place].   Pt does not complain of any pain while awake.   Levo stopped @ 0915. LR @ 100 ml/hr started. BP stable throughout day.   Lactate rechecked @ 1500 (2.0, previously 2.4).   Foley removed this afternoon, external condom cath applied. Pt voiding post foley removal.   Pt tolerating liquid diet; pt given Maalox post lunch.   Wife @ bedside this AM.   Pt A/O x4, pt in no acute distress @ this time. Will continue to monitor.

## 2021-03-13 ENCOUNTER — Inpatient Hospital Stay: Payer: Medicare Other

## 2021-03-13 DIAGNOSIS — I4891 Unspecified atrial fibrillation: Secondary | ICD-10-CM

## 2021-03-13 DIAGNOSIS — R918 Other nonspecific abnormal finding of lung field: Secondary | ICD-10-CM | POA: Diagnosis not present

## 2021-03-13 LAB — CBC
HCT: 33.4 % — ABNORMAL LOW (ref 39.0–52.0)
Hemoglobin: 11.1 g/dL — ABNORMAL LOW (ref 13.0–17.0)
MCH: 31.8 pg (ref 26.0–34.0)
MCHC: 33.2 g/dL (ref 30.0–36.0)
MCV: 95.7 fL (ref 80.0–100.0)
Platelets: 65 10*3/uL — ABNORMAL LOW (ref 150–400)
RBC: 3.49 MIL/uL — ABNORMAL LOW (ref 4.22–5.81)
RDW: 13.8 % (ref 11.5–15.5)
WBC: 12.7 10*3/uL — ABNORMAL HIGH (ref 4.0–10.5)
nRBC: 0 % (ref 0.0–0.2)

## 2021-03-13 LAB — BASIC METABOLIC PANEL
Anion gap: 4 — ABNORMAL LOW (ref 5–15)
BUN: 14 mg/dL (ref 8–23)
CO2: 27 mmol/L (ref 22–32)
Calcium: 8.5 mg/dL — ABNORMAL LOW (ref 8.9–10.3)
Chloride: 108 mmol/L (ref 98–111)
Creatinine, Ser: 1 mg/dL (ref 0.61–1.24)
GFR, Estimated: >60 mL/min (ref >60)
Glucose, Bld: 88 mg/dL (ref 70–99)
Potassium: 3.6 mmol/L (ref 3.5–5.1)
Sodium: 139 mmol/L (ref 135–145)

## 2021-03-13 LAB — MRSA CULTURE: Culture: NOT DETECTED

## 2021-03-13 LAB — LEGIONELLA PNEUMOPHILA SEROGP 1 UR AG: L. pneumophila Serogp 1 Ur Ag: NEGATIVE

## 2021-03-13 LAB — MAGNESIUM: Magnesium: 2.1 mg/dL (ref 1.7–2.4)

## 2021-03-13 LAB — PHOSPHORUS: Phosphorus: 2 mg/dL — ABNORMAL LOW (ref 2.5–4.6)

## 2021-03-13 MED ORDER — K PHOS MONO-SOD PHOS DI & MONO 155-852-130 MG PO TABS
250.0000 mg | ORAL_TABLET | Freq: Three times a day (TID) | ORAL | Status: AC
  Administered 2021-03-13 (×3): 250 mg via ORAL
  Filled 2021-03-13 (×4): qty 1

## 2021-03-13 MED ORDER — METOPROLOL TARTRATE 5 MG/5ML IV SOLN: 5.0000 mg | Freq: Four times a day (QID) | INTRAVENOUS | Status: DC | PRN

## 2021-03-13 NOTE — Progress Notes (Signed)
NAME:  Justin [name], MRN:  [id number], DOB:  [birth year], LOS: 3 ADMISSION DATE:  03/10/2021, CONSULTATION DATE:  03/11/2021 REFERRING MD:  EDP , CHIEF COMPLAINT: Inguinal hernia, shock  History of Present Illness:  80yo m with NSTEMI, OSA, GERD, CAD, osteoporosis, basal cell ca, BPH with prostate CA, CKD, severe claudication, dyslipidemia, Emphysema who came to ED with complaints of  acute onset of dyspnea, vomiting and abd pain who came in due to abd pain. He was found to be severely hypotensive in circulatory shock and further had inguinal hernia which was incarcerated on right and had left sided pneumonia on CT chest.  Pertinent  Medical History    has a past medical history of Adenomatous polyps, Anginal pain ([place]), Arthritis, Baastrup's syndrome, BPH (benign prostatic hyperplasia), CAD (coronary artery disease), Cataract, Chronic kidney disease, COPD (chronic obstructive pulmonary disease) ([place]), DJD (degenerative joint disease), Does use hearing aid, Dyspnea, Dysrhythmia, GERD (gastroesophageal reflux disease), History of kidney stones, History of open heart surgery, Hypertension, Myocardial infarction [facility]), Osteoporosis, Prostate cancer ([place]), Skin cancer, Sleep apnea, 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   Significant Hospital Events: Including procedures, antibiotic start and stop dates in addition to other pertinent events   6/19 Admit. Inguinal hernia is reduced.  Surgery canceled plans for OR 6/21 requiring low-dose Levophed 6/22 off Levophed  Interim History / Subjective:   Weaned off Levophed  Objective   Blood pressure 136/76, pulse (!) 53, temperature 98.1 F (36.7 C), temperature source Oral, resp. rate 20, height 5\' 8"  (1.727 m), weight 81.7 kg, SpO2 98 %.        Intake/Output Summary (Last 24 hours) at 03/13/2021 0936 Last data filed at 03/13/2021 0600 Gross per 24 hour  Intake 2240.21 ml   Output 1435 ml  Net 805.21 ml   Filed Weights   03/11/21 0457 03/12/21 0500 03/13/21 0410  Weight: 80.3 kg 84.3 kg 81.7 kg    Examination: Blood pressure 136/76, pulse (!) 53, temperature 98.1 F (36.7 C), temperature source Oral, resp. rate 20, height 5\' 8"  (1.727 m), weight 81.7 kg, SpO2 98 %. Gen:      No acute distress HEENT:  EOMI, sclera anicteric Neck:     No masses; no thyromegaly Lungs:    Clear to auscultation bilaterally; normal respiratory effort CV:         Regular rate and rhythm; no murmurs Abd:      + bowel sounds; soft, non-tender; no palpable masses, no distension Ext:    No edema; adequate peripheral perfusion Skin:      Warm and dry; no rash Neuro: alert and oriented x 3 Psych: normal mood and affect   Labs/imaging that I havepersonally reviewed  (right click and "Reselect all SmartList Selections" daily)   Creatinine improving to 1, lactic acid 2 WBC 12.7, hemoglobin 9.1, platelets 65  Resolved Hospital Problem list     Assessment & Plan:  Hypoxic respiratory failure secondary to pneumonia Continue Zosyn.  Flagyl stopped Bedside ultrasound performed.  Left effusion is too small to tap.  Continue monitoring Continue bronchodilators, nebs  Lt lung 03/13/21   Shock secondary to sepsis present on admission Off pressors  Incarcerated inguinal hernia.   Has reduced spontaneously No plans for OR by surgery.  Will need follow-up as an outpatient PPI and Maalox for GERD  Patient and daughter updated at bedside  Stable for transfer out of ICU and to [facility]  Best  Practice (right click and "Reselect all SmartList Selections" daily)   Diet/type: Regular consistency (see orders) Pain/Anxiety/Delirium protocol Not indicated VAP protocol (if indicated): Not indicated DVT prophylaxis: LMWH GI prophylaxis: H2B Glucose control:  not indicated Central venous access:  N/A Arterial line:  N/A Foley:  N/A Mobility:  bed rest  PT consulted:  N/A Studies pending: None Culture data pending:none Last reviewed culture data:today Antibiotics:zosyn Antibiotic de-escalation: no,  continue current rx Stop date: to be determined  Daily labs: requested Code Status:  full code Last date of multidisciplinary goals of care discussion []  ccm prognosis: Serious Disposition: ready for transfer to med-surg  Signature:   Marshell Garfinkel MD [place] [facility] See Amion for pager  If no response to pager , please call [phone] until 7pm After 7:00 pm call Elink  [phone] 03/13/2021, 10:00 AM

## 2021-03-13 NOTE — Evaluation (Signed)
Physical Therapy Evaluation Patient Details Name: Justin [name] MRN: [id number] DOB: [birth year] Today's Date: 03/13/2021   History of Present Illness  80yo m with NSTEMI, OSA, GERD, CAD, osteoporosis, basal cell ca, BPH with prostate CA, CKD, severe claudication, dyslipidemia, Emphysema who came to ED with complaints of  acute onset of dyspnea, vomiting and abd pain who came in due to abd pain. He was found to be severely hypotensive.  Also with R inguinal hernia and L lung PNA.  Clinical Impression  Pt recently transferred off CCU.  Reports he is eager to see what he can do.  Voices some concern about pain in R shoulder with AROM elevation, with gross testing it appears soft tissue is intact and likely has a strain, instructed to f/u with ortho is symptoms persist over the next 1-2 weeks.  Regarding mobility he did well and was able to get to sitting and then standing w/o assist. Minimal cuing needed.  Pt does not normally need an AD and is able to be very active, he also never walks w/o shoes but did not have them in hospital today.  He was able to circumambulate the nurses' station with walker (did ~25 ft with single rail with less confidence).  Did try some activity on room air and sats dropped into the high 80s, back to mid 90s on 1L.  By the end of ambulation his sats were into the high 80s even on 1L, additionally HR rose with activity from 80s to low 100s.  Pt does not endorse excessive fatigue but is far from his typical baseline and will benefit from continued PT at home once medically ready for d/c.      Follow Up Recommendations Home health PT    Equipment Recommendations  Rolling walker with 5" wheels (per progress)    Recommendations for Other Services       Precautions / Restrictions Precautions Precautions: Fall Restrictions Weight Bearing Restrictions: No      Mobility  Bed Mobility Overal bed mobility: Modified Independent             General bed mobility  comments: gets to EOB relatively easily, did need heavy use of walker    Transfers Overall transfer level: Modified independent Equipment used: Rolling walker (2 wheeled)             General transfer comment: Minimal cuing to insure approptiate use of UEs/rail/walker, but able to rise from low bed setting w/o assist - did push well through R UE  Ambulation/Gait Ambulation/Gait assistance: Min guard Gait Distance (Feet): 200 Feet Assistive device: Rolling walker (2 wheeled);1 person hand held assist       General Gait Details: Pt never walks w/o foot wear, had some hesitancy with stocking feet, but was able to circumambulate the nurses' station relatively confidently.  He does not typically need any AD, though was much more confident and safe with being able to [facility] through it relatively well.  Pt's O2 did drop with activity, maintained on 1L O2 t/o the effort with sats slowly dropping into the high 80s, additionally HR did gradually increase from 80s to low 100s.  Reports fatigue with the effort and is still far from baseline but overall did well for first walk since hosptialization/leaving CCU.  Stairs            Wheelchair Mobility    Modified Rankin (Stroke Patients Only)       Balance Overall balance assessment: Modified Independent  Pertinent Vitals/Pain Pain Assessment: 0-10 Pain Score: 3  Pain Location: R shoulder with ABd and forward flexion    Home Living Family/patient expects to be discharged to:: Private residence Living Arrangements: Spouse/significant other Available Help at Discharge: Available 24 hours/day   Home Access: Stairs to enter;Elevator Entrance Stairs-Rails:  (yes) Entrance Stairs-Number of Steps: 5 Home Layout: Multi-level (can use elevator for entry and to his bedroom on the second story) Home Equipment: None      Prior Function Level of Independence: Independent          Comments: Pt reports that he stays active, works in the yard, does the shopping, Health visitor Dominance        Extremity/Trunk Assessment   Upper Extremity Assessment Upper Extremity Assessment: Generalized weakness (R shoulder with hesitancy/weakness/pain with shoulder elevation however was able to raise to ~120 with effort, similar to non-dominant L side)    Lower Extremity Assessment Lower Extremity Assessment: Overall WFL for tasks assessed;Generalized weakness (general arthritic stiffness but age appropriate limitations)    Cervical / Trunk Assessment Cervical / Trunk Assessment: Kyphotic  Communication   Communication: No difficulties  Cognition Arousal/Alertness: Awake/alert Behavior During Therapy: WFL for tasks assessed/performed Overall Cognitive Status: Within Functional Limits for tasks assessed                                        General Comments      Exercises     Assessment/Plan    PT Assessment Patient needs continued PT services  PT Problem List Decreased range of motion;Decreased strength;Decreased activity tolerance;Decreased balance;Decreased mobility;Decreased coordination;Decreased knowledge of use of DME;Decreased safety awareness;Pain;Cardiopulmonary status limiting activity       PT Treatment Interventions DME instruction;Gait training;Stair training;Functional mobility training;Therapeutic activities;Therapeutic exercise;Balance training;Neuromuscular re-education;Patient/family education    PT Goals (Current goals can be found in the Care Plan section)  Acute Rehab PT Goals Patient Stated Goal: go home` PT Goal Formulation: With patient Time For Goal Achievement: 03/27/21 Potential to Achieve Goals: Good    Frequency Min 2X/week   Barriers to discharge        Co-evaluation               AM-PAC PT "6 Clicks" Mobility  Outcome Measure Help needed turning from your back to your side while in a flat  bed without using bedrails?: None Help needed moving from lying on your back to sitting on the side of a flat bed without using bedrails?: None Help needed moving to and from a bed to a chair (including a wheelchair)?: None Help needed standing up from a chair using your arms (e.g., wheelchair or bedside chair)?: A Little Help needed to walk in hospital room?: A Little Help needed climbing 3-5 steps with a railing? : A Little 6 Click Score: 21    End of Session Equipment Utilized During Treatment: Gait belt;Oxygen (1L) Activity Tolerance: Patient tolerated treatment well Patient left: with chair alarm set;with call bell/phone within reach;with family/visitor present Nurse Communication: Mobility status PT Visit Diagnosis: Muscle weakness (generalized) (M62.81);Difficulty in walking, not elsewhere classified (R26.2)    Time: 1610-9604 PT Time Calculation (min) (ACUTE ONLY): 27 min   Charges:   PT Evaluation $PT Eval Low Complexity: 1 Low PT Treatments $Gait Training: 8-22 mins        Kreg Shropshire, DPT 03/13/2021, 2:11 PM

## 2021-03-13 NOTE — Progress Notes (Signed)
Patient has been in a-fib and a-flutter off and on. Made MD aware of same. VS WNL.

## 2021-03-13 NOTE — Progress Notes (Signed)
PHARMACY CONSULT NOTE  Pharmacy Consult for Electrolyte Monitoring and Replacement   Recent Labs: Potassium (mmol/L)  Date Value  03/13/2021 3.6   Magnesium (mg/dL)  Date Value  03/13/2021 2.1   Calcium (mg/dL)  Date Value  03/13/2021 8.5 (L)   Phosphorus (mg/dL)  Date Value  03/13/2021 2.0 (L)   Sodium (mmol/L)  Date Value  03/13/2021 139    Assessment: 80 y.o. male with PMH of Baastrup's syndrome, BPH, CAD, CKD, COPD, DJD, prostate cancer who presents to the ED for evaluation of N/V/D found to have, now reduced, left inguinal hernia.  Phos: 3.8>2.5>2.0 Scr: 1.54>1.17>1  Goal of Therapy:  Electrolytes WNL  Plan:  Hypophos: replace with Kphos 250mg  TID x3doses  Also delivers ~3.3 meq K+ Re-check electrolytes in am  Lorna Dibble ,PharmD Clinical Pharmacist 03/13/2021 7:51 AM

## 2021-03-13 NOTE — Progress Notes (Signed)
PCCM note  Patient with transient increase in heart rate to the 140s with new onset atrial fibrillation.  Right now he is rate controlled in the 80s He was mildly short of breath on 2 L oxygen but is starting to feel better now  IV Lopressor as needed He is hemodynamically stable at present Consider cardiology consult in a.m.  Marshell Garfinkel MD [facility] See Amion for pager  If no response to pager , please call [phone] until 7pm After 7:00 pm call Elink  [phone] 03/13/2021, 6:59 PM

## 2021-03-14 ENCOUNTER — Inpatient Hospital Stay
Admit: 2021-03-14 | Discharge: 2021-03-14 | Disposition: A | Discharge status: Home / Self Care | Payer: Medicare Other | Attending: Physician Assistant | Admitting: Physician Assistant

## 2021-03-14 ENCOUNTER — Ambulatory Visit: Payer: Medicare Other | Admitting: General Surgery

## 2021-03-14 ENCOUNTER — Ambulatory Visit: Payer: Medicare Other | Admitting: Dermatology

## 2021-03-14 DIAGNOSIS — J181 Lobar pneumonia, unspecified organism: Secondary | ICD-10-CM

## 2021-03-14 DIAGNOSIS — K4090 Unilateral inguinal hernia, without obstruction or gangrene, not specified as recurrent: Secondary | ICD-10-CM

## 2021-03-14 DIAGNOSIS — D696 Thrombocytopenia, unspecified: Secondary | ICD-10-CM

## 2021-03-14 DIAGNOSIS — A419 Sepsis, unspecified organism: Principal | ICD-10-CM

## 2021-03-14 DIAGNOSIS — R6521 Severe sepsis with septic shock: Secondary | ICD-10-CM

## 2021-03-14 DIAGNOSIS — J9601 Acute respiratory failure with hypoxia: Secondary | ICD-10-CM

## 2021-03-14 DIAGNOSIS — I4891 Unspecified atrial fibrillation: Secondary | ICD-10-CM

## 2021-03-14 LAB — ECHOCARDIOGRAM COMPLETE
AR max vel: 2.27 cm2
AV Area VTI: 3.02 cm2
AV Area mean vel: 2.89 cm2
AV Mean grad: 2 mmHg
AV Peak grad: 5.1 mmHg
Ao pk vel: 1.13 m/s
Area-P 1/2: 7.37 cm2
Height: 68 in
S' Lateral: 3.65 cm
Weight: 2761.92 oz

## 2021-03-14 LAB — MAGNESIUM: Magnesium: 2 mg/dL (ref 1.7–2.4)

## 2021-03-14 LAB — CBC
HCT: 34.2 % — ABNORMAL LOW (ref 39.0–52.0)
Hemoglobin: 11.3 g/dL — ABNORMAL LOW (ref 13.0–17.0)
MCH: 32.2 pg (ref 26.0–34.0)
MCHC: 33 g/dL (ref 30.0–36.0)
MCV: 97.4 fL (ref 80.0–100.0)
Platelets: 67 10*3/uL — ABNORMAL LOW (ref 150–400)
RBC: 3.51 MIL/uL — ABNORMAL LOW (ref 4.22–5.81)
RDW: 14.1 % (ref 11.5–15.5)
WBC: 5.8 10*3/uL (ref 4.0–10.5)
nRBC: 0 % (ref 0.0–0.2)

## 2021-03-14 LAB — BASIC METABOLIC PANEL
Anion gap: 6 (ref 5–15)
BUN: 14 mg/dL (ref 8–23)
CO2: 27 mmol/L (ref 22–32)
Calcium: 8.6 mg/dL — ABNORMAL LOW (ref 8.9–10.3)
Chloride: 106 mmol/L (ref 98–111)
Creatinine, Ser: 0.93 mg/dL (ref 0.61–1.24)
GFR, Estimated: >60 mL/min (ref >60)
Glucose, Bld: 88 mg/dL (ref 70–99)
Potassium: 3.5 mmol/L (ref 3.5–5.1)
Sodium: 139 mmol/L (ref 135–145)

## 2021-03-14 LAB — PHOSPHORUS: Phosphorus: 2.5 mg/dL (ref 2.5–4.6)

## 2021-03-14 LAB — PATHOLOGIST SMEAR REVIEW

## 2021-03-14 MED ORDER — METHYLPREDNISOLONE SODIUM SUCC 40 MG IJ SOLR
40.0000 mg | Freq: Every day | INTRAMUSCULAR | Status: DC
  Administered 2021-03-14 – 2021-03-15 (×2): 40 mg via INTRAVENOUS
  Filled 2021-03-14 (×2): qty 1

## 2021-03-14 MED ORDER — ISOSORBIDE MONONITRATE ER 30 MG PO TB24
60.0000 mg | ORAL_TABLET | Freq: Every day | ORAL | Status: DC
  Administered 2021-03-15: 60 mg via ORAL
  Filled 2021-03-14: qty 2

## 2021-03-14 MED ORDER — KETOROLAC TROMETHAMINE 30 MG/ML IJ SOLN
15.0000 mg | Freq: Three times a day (TID) | INTRAMUSCULAR | Status: DC | PRN
  Administered 2021-03-14 – 2021-03-15 (×2): 15 mg via INTRAVENOUS
  Filled 2021-03-14 (×2): qty 1

## 2021-03-14 MED ORDER — GABAPENTIN 300 MG PO CAPS
300.0000 mg | ORAL_CAPSULE | Freq: Every day | ORAL | Status: DC
  Administered 2021-03-14: 22:00:00 300 mg via ORAL
  Filled 2021-03-14: qty 1

## 2021-03-14 MED ORDER — SUCRALFATE 1 G PO TABS
1.0000 g | ORAL_TABLET | Freq: Four times a day (QID) | ORAL | Status: DC
  Administered 2021-03-14 – 2021-03-15 (×2): 1 g via ORAL
  Filled 2021-03-14 (×2): qty 1

## 2021-03-14 MED ORDER — METOPROLOL TARTRATE 25 MG PO TABS
25.0000 mg | ORAL_TABLET | Freq: Two times a day (BID) | ORAL | Status: DC
  Administered 2021-03-15: 08:00:00 25 mg via ORAL
  Filled 2021-03-14: qty 1

## 2021-03-14 MED ORDER — PANTOPRAZOLE SODIUM 40 MG PO TBEC
40.0000 mg | DELAYED_RELEASE_TABLET | Freq: Two times a day (BID) | ORAL | Status: DC
  Administered 2021-03-14 – 2021-03-15 (×2): 40 mg via ORAL
  Filled 2021-03-14 (×2): qty 1

## 2021-03-14 MED ORDER — FLUTICASONE PROPIONATE 50 MCG/ACT NA SUSP
2.0000 | Freq: Every day | NASAL | Status: DC
  Administered 2021-03-14: 22:00:00 2 via NASAL
  Filled 2021-03-14: qty 16

## 2021-03-14 MED ORDER — ATORVASTATIN CALCIUM 20 MG PO TABS
40.0000 mg | ORAL_TABLET | Freq: Every day | ORAL | Status: DC
  Administered 2021-03-14: 22:00:00 40 mg via ORAL
  Filled 2021-03-14: qty 2

## 2021-03-14 MED ORDER — METOPROLOL SUCCINATE ER 25 MG PO TB24
25.0000 mg | ORAL_TABLET | Freq: Every day | ORAL | Status: DC
  Administered 2021-03-14: 25 mg via ORAL
  Filled 2021-03-14: qty 1

## 2021-03-14 NOTE — Progress Notes (Addendum)
Paged MD weiting "pt is c/o pain in shoulder 9/10, 1 mg IV morphine 2 hours ago. he has tylenol ordered. Ill give that soon. but can he have something longer acting? ortho recommend NSAIDS with  tylenol. ortho said he did not need sling but pt is requesting sling?" See new orders

## 2021-03-14 NOTE — Care Management Important Message (Signed)
Important Message  Patient Details  Name: Justin [name] MRN: [id number] Date of Birth: 02-09-1941   Medicare Important Message Given:  Yes     Juliann Pulse A Sophiamarie Nease 03/14/2021, 10:47 AM

## 2021-03-14 NOTE — Progress Notes (Signed)
Pt request four side rails up.

## 2021-03-14 NOTE — TOC Initial Note (Signed)
Transition of Care [facility]) - Initial/Assessment Note    Patient Details  Name: Justin [name] MRN: [id number] Date of Birth: 09-14-1941  [facility] CM/SW Contact:    Shelbie Hutching, RN Phone Number: 03/14/2021, 11:39 AM  Clinical Narrative:                 Patient admitted to the hospital with sepsis and left lower lobe pneumonia.  Patient is doing better and should be able to go home tomorrow with home health services per MD.   [facility] met with patient at the bedside.  Patient is from home with his wife, he reports being very active at home and likes working in his yard.  Patient drives, his wife or daughter can pick him up at discharge.  Patient does not have any assistive devices at home but PT is recommending RW and patient would like to have one.  RW ordered from [employer] and will be delivered to the patient's room.   Home Health has been recommended and patient agrees, he does not have a preference in agency.  Malachy Mood with [facility] accepts the referral for RN, PT, and OT.    Expected Discharge Plan: [place] Barriers to Discharge: Continued Medical Work up   Patient Goals and CMS Choice Patient states their goals for this hospitalization and ongoing recovery are:: to go home with home health CMS Medicare.gov Compare Post Acute Care list provided to:: Patient Choice offered to / list presented to : Patient  Expected Discharge Plan and Services Expected Discharge Plan: [place]   Discharge Planning Services: CM Consult Post Acute Care Choice: [place] arrangements for the past 2 months: Single Family Home                 DME Arranged: [place] DME Agency: [employer] Date DME Agency Contacted: 03/14/21 Time DME Agency Contacted: 36 Representative spoke with at DME Agency: Suanne Marker HH Arranged: RN, PT, OT [facility] Agency: [place] Date [place]: 03/14/21 Time [place]:  [id number] Representative spoke with at [place]: Malachy Mood  Prior Living Arrangements/Services Living arrangements for the past 2 months: [place] Lives with:: Spouse Patient language and need for interpreter reviewed:: Yes Do you feel safe going back to the place where you live?: Yes      Need for Family Participation in Patient Care: Yes (Comment) (pneumonia) Care giver support system in place?: Yes (comment) (wife and daughter) Current home services: DME Management consultant and wheelchair) Criminal Activity/Legal Involvement Pertinent to Current Situation/Hospitalization: No - Comment as needed  Activities of Daily Living      Permission Sought/Granted Permission sought to share information with : Case Manager, Family Supports, Other (comment) Permission granted to share information with : Yes, Verbal Permission Granted  Share Information with NAME: Cristela Blue  Permission granted to share info w AGENCY: [facility]  Permission granted to share info w Relationship: wife and daughter     Emotional Assessment Appearance:: Appears stated age Attitude/Demeanor/Rapport: Engaged Affect (typically observed): Accepting Orientation: : Oriented to Self, Oriented to Place, Oriented to  Time, Oriented to Situation Alcohol / Substance Use: Not Applicable Psych Involvement: No (comment)  Admission diagnosis:  SBO (small bowel obstruction) ([place]) [K56.609] Septic shock (HCC) [A41.9, R65.21] Incarcerated left inguinal hernia [K40.30] Community acquired pneumonia of left lower lobe of lung [J18.9] Left lower lobe pulmonary infiltrate [R91.8] Patient Active Problem List   Diagnosis Date Noted  Left lower lobe pulmonary infiltrate 03/10/2021   PAD (peripheral artery disease) ([place]) 11/10/2018   Leg pain 11/10/2018   History of compression fracture of spine 07/30/2017   Pneumonia 11/03/2015   NSTEMI (non-ST elevated myocardial infarction) ([place]) 11/03/2015   DDD (degenerative disc disease), lumbar  10/12/2014   Incomplete bladder emptying 02/15/2014   Obstruction of urinary tract 02/15/2014   OP (osteoporosis) 01/21/2014   Esophagitis, reflux 01/21/2014   Osteoporosis 01/21/2014   Fatigue 09/30/2013   Malaise and fatigue 09/30/2013   H/O malignant neoplasm of prostate 12/15/2012   Calculus of kidney 12/09/2012   ED (erectile dysfunction) of organic origin 12/09/2012   CA of prostate ([place]) 12/09/2012   Genuine stress incontinence, male 12/09/2012   Urge incontinence 12/09/2012   Arteriosclerosis of coronary artery 06/17/2011   Atherosclerosis of coronary artery 06/17/2011   HLD (hyperlipidemia) 06/17/2011   Obstructive apnea 06/17/2011   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

## 2021-03-14 NOTE — Progress Notes (Signed)
   03/14/21 0929  Incentive Spirometry  Incentive Spirometry Goal (mL) (RN or RT) 750 mL  Incentive Spirometry - Achieved (mL) (RN, NT, or RT) 500 mL  Incentive Spirometry - # of Times (RN or NT) 3  Incentive Spirometry Effort (RN) Needs reinforcement  Incentive Spirometry Use (NT) Observed, patient had no questions  Weaned O2 to room air. Sats 92% educated use of IS and how often pt returned demonstration

## 2021-03-14 NOTE — Consult Note (Signed)
ORTHOPAEDIC CONSULTATION  REQUESTING PHYSICIAN: Loletha Grayer, MD  Chief Complaint: Right shoulder pain  HPI: Justin [name] is a 80 y.o. male who complains of right shoulder pain.  He has a history of rotator cuff injury with steroid injection from Dr. Sabra Heck many years ago.  Symptoms started when he arrived at the hospital on Sunday and thinks that it may be related to when he was lifted into the ambulance.  Pain is worse with active motion in forward flexion of the shoulder. Denies any numbness, tingling or constitutional symptoms.  X-rays were obtained which showed no evidence of fracture or significant arthritic changes.  Orthopedics was consulted regarding further management.  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   BPH (benign prostatic hyperplasia)    CAD (coronary artery disease)    Cataract    Chronic kidney disease    COPD (chronic obstructive pulmonary disease) (HCC)    DJD (degenerative joint disease)    Does use hearing aid    bilateral   Dyspnea    Dysrhythmia    GERD (gastroesophageal reflux disease)    History of kidney stones    History of open heart surgery    Hypertension    Myocardial infarction [facility])    Osteoporosis    Prostate cancer ([place])    Skin cancer    Sleep apnea    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   Wears dentures    partial upper and lower   Past Surgical History:  Procedure Laterality Date   BACK SURGERY     CARDIAC CATHETERIZATION N/A 11/07/2015   Procedure: Left Heart Cath and Coronary Angiography;  Surgeon: Teodoro Spray, MD;  Location: [place] [facility];  Service: Cardiovascular;  Laterality: N/A;   CARDIAC CATHETERIZATION N/A 11/07/2015   Procedure: Coronary Stent Intervention;  Surgeon: Isaias Cowman, MD;  Location: [place] [facility];  Service: Cardiovascular;  Laterality: N/A;   CATARACT EXTRACTION     CHOLECYSTECTOMY     COLONOSCOPY N/A 06/13/2020   Procedure: COLONOSCOPY;  Surgeon: Toledo, Benay Pike, MD;  Location: [facility] ENDOSCOPY;  Service: Gastroenterology;  Laterality: N/A;   COLONOSCOPY WITH PROPOFOL N/A 03/27/2017   Procedure: COLONOSCOPY WITH PROPOFOL;  Surgeon: Manya Silvas, MD;  Location: [facility] ENDOSCOPY;  Service: Endoscopy;  Laterality: N/A;   CORONARY ANGIOPLASTY     CORONARY ARTERY BYPASS GRAFT     ESOPHAGOGASTRODUODENOSCOPY N/A 01/14/2021   Procedure: ESOPHAGOGASTRODUODENOSCOPY (EGD);  Surgeon: Toledo, Benay Pike, MD;  Location: [facility] ENDOSCOPY;  Service: Gastroenterology;  Laterality: N/A;   ESOPHAGOGASTRODUODENOSCOPY (EGD) WITH PROPOFOL N/A 03/27/2017   Procedure: ESOPHAGOGASTRODUODENOSCOPY (EGD) WITH PROPOFOL;  Surgeon: Manya Silvas, MD;  Location: [facility] ENDOSCOPY;  Service: Endoscopy;  Laterality: N/A;   EXCISION PARTIAL PHALANX Right 09/10/2017   Procedure: EXCISION PARTIAL PHALANX-2nd toe;  Surgeon: Albertine Patricia, DPM;  Location: [place];  Service: Podiatry;  Laterality: Right;  sleep apnea   EYE SURGERY     HERNIA REMOVED     HERNIA REPAIR     kissing spine syndrome     KYPHOPLASTY N/A 03/23/2020   Procedure: KYPHOPLASTY L1;  Surgeon: Hessie Knows, MD;  Location: [facility] ORS;  Service: Orthopedics;  Laterality: N/A;   KYPHOPLASTY N/A 05/15/2020   Procedure: T 12  Kyphoplasty;  Surgeon: Hessie Knows, MD;  Location: [facility] ORS;  Service: Orthopedics;  Laterality: N/A;   kyphoplasty t10 t11     LEFT HEART CATH AND CORONARY ANGIOGRAPHY Left 09/28/2018   Procedure: LEFT HEART CATH AND CORONARY ANGIOGRAPHY;  Surgeon: Teodoro Spray, MD;  Location: [place] [facility];  Service: Cardiovascular;  Laterality: Left;   OPEN HEART SURGERY     RETROPUBIC PROSTATECTOMY     SKIN CANCER REMOVED     Social History   Socioeconomic History   Marital status: Married    Spouse name: Not  on file   Number of children: Not on file   Years of education: Not on file   Highest education level: Not on file  Occupational History   Not on file  Tobacco Use   Smoking status: Former    Years: 35.00    Pack years: 0.00    Types: Cigarettes    Quit date: 06/26/1990    Years since quitting: 30.7   Smokeless tobacco: Never  Vaping Use   Vaping Use: Never used  Substance and Sexual Activity   Alcohol use: No    Alcohol/week: 0.0 standard drinks   Drug use: No   Sexual activity: Not on file  Other Topics Concern   Not on file  Social History Narrative   Not on file   Social Determinants of Health   Financial Resource Strain: Not on file  Food Insecurity: Not on file  Transportation Needs: Not on file  Physical Activity: Not on file  Stress: Not on file  Social Connections: Not on file   Family History  Problem Relation Age of Onset   Cancer Father    Prostate cancer Father    Bladder Cancer Neg Hx    Kidney cancer Neg Hx    Allergies  Allergen Reactions   Hydrochlorothiazide W-Triamterene Nausea Only   Prior to Admission medications   Medication Sig Start Date End Date Taking? Authorizing Provider  nitroGLYCERIN (NITROSTAT) 0.4 MG SL tablet Place 0.4 mg under the tongue every 5 (five) minutes x 3 doses as needed for chest pain. If no relief call MD or go to emergency room.   Yes [provider]  traMADol (ULTRAM) 50 MG tablet Take 50 mg by mouth in the morning.   Yes [provider]  acetaminophen (TYLENOL) 500 MG tablet Take 1,000 mg by mouth every 8 (eight) hours as needed.    [provider]  albuterol (PROVENTIL HFA;VENTOLIN HFA) 108 (90 Base) MCG/ACT inhaler Inhale 2 puffs into the lungs every 4 (four) hours as needed for wheezing or shortness of breath.    [provider]  aspirin EC 81 MG tablet Take 81 mg by mouth daily.    [provider]  atorvastatin (LIPITOR) 80 MG tablet Take 40 mg by mouth at bedtime.      [provider]  Calcium Carbonate-Vitamin D 600-400 MG-UNIT tablet Take 1 tablet by mouth 2 (two) times daily.    [provider]  cholecalciferol (VITAMIN D) 1000 units tablet Take 3,000 Units by mouth daily.     [provider]  cyanocobalamin 1000 MCG tablet Take 1,000 mcg by mouth at bedtime.     [provider]  fexofenadine (ALLEGRA) 180 MG tablet Take 180 mg by mouth daily before breakfast.     [provider]  fluticasone (FLONASE) 50 MCG/ACT nasal spray Place 2 sprays into both nostrils at bedtime.    [provider]  gabapentin (NEURONTIN) 300 MG capsule Take 300 mg by mouth at bedtime.     [provider]  hydrocortisone (ANUSOL-HC) 25 MG suppository Place 25 mg rectally 2 (two) times daily as needed for hemorrhoids or anal itching.     [provider]  ibuprofen (ADVIL) 200 MG tablet Take 400 mg by mouth 3 (three) times daily.    [provider]  isosorbide mononitrate (IMDUR) 60 MG 24 hr tablet Take 60 mg by mouth daily.    [provider]  Magnesium 400 MG CAPS Take 400 mg by mouth 2 (two) times daily.     [provider]  metoprolol tartrate (LOPRESSOR) 25 MG tablet Take 25 mg by mouth 2 (two) times daily.     [provider]  Multiple Vitamins-Minerals (EMERGEN-C VITAMIN C) PACK Take 1 packet by mouth daily.    [provider]  niacin 500 MG tablet Take 500 mg by mouth 3 (three) times daily.     [provider]  Omega-3 Fatty Acids (FISH OIL) 1000 MG CAPS Take 1,000 mg by mouth 2 (two) times daily.    [provider]  omeprazole (PRILOSEC) 40 MG capsule Take 1 capsule by mouth 2 (two) times daily. 10/02/20   [provider]  potassium chloride SA (K-DUR,KLOR-CON) 20 MEQ tablet Take 20 mEq by mouth 2 (two) times daily.     [provider]  Probiotic CAPS Take 2 capsules by mouth at bedtime.    [provider]  senna-docusate  (SENOKOT-S) 8.6-50 MG tablet Take 2 tablets by mouth 2 (two) times daily.    [provider]  sucralfate (CARAFATE) 1 g tablet Take 1 g by mouth 4 (four) times daily. 09/27/20   [provider]  TART CHERRY PO Take 1 capsule by mouth daily.    [provider]  Tiotropium Bromide-Olodaterol 2.5-2.5 MCG/ACT AERS Inhale 2 puffs into the lungs daily.    [provider]  torsemide (DEMADEX) 20 MG tablet Take 60 mg by mouth daily.     [provider]  Wheat Dextrin (BENEFIBER DRINK MIX PO) Take 1 Dose by mouth daily.     [provider]   DG Shoulder Right  Result Date: 03/13/2021 CLINICAL DATA:  Pain. Right upper arm and shoulder pain. No known injury. EXAM: RIGHT SHOULDER - 2+ VIEW COMPARISON:  None. FINDINGS: There is no evidence of fracture or dislocation. Mild acromioclavicular spurring. Unremarkable glenohumeral joint. No erosion, focal bone lesion or bone destruction. Soft tissues are unremarkable. IMPRESSION: Mild acromioclavicular osteoarthritis. Electronically Signed   By: Keith Rake M.D.   On: 03/13/2021 15:29   DG Chest Port 1 View  Result Date: 03/13/2021 CLINICAL DATA:  Acute respiratory failure. EXAM: PORTABLE CHEST 1 VIEW COMPARISON:  03/12/2021. FINDINGS: Prior CABG. Stable cardiomegaly. Low lung volumes. Persistent left lung infiltrate/edema and small left pleural effusion. No interim change. No pneumothorax. Prior vertebroplasties again noted. IMPRESSION: 1.  Prior CABG.  Stable cardiomegaly. 2. Low lung volumes. Persistent left lung infiltrate/edema and small left pleural effusion. No interim change. Electronically Signed   By: Marcello Moores  Register   On: 03/13/2021 05:27   DG Humerus Right  Result Date: 03/13/2021 CLINICAL DATA:  Pain. Right upper arm and shoulder pain. No known injury. EXAM: RIGHT HUMERUS - 2+ VIEW COMPARISON:  None. FINDINGS: Cortical margins of the humerus are intact. There is no evidence of fracture or other  focal bone lesions. Elbow alignment is maintained. Soft tissues are unremarkable. IMPRESSION: Negative  radiographs of the right humerus. Electronically Signed   By: Keith Rake M.D.   On: 03/13/2021 15:29    Positive ROS: All other systems have been reviewed and were otherwise negative with the exception of those mentioned in the HPI and as above.  Physical Exam: General: Alert, no acute distress Cardiovascular: No pedal edema Respiratory: No cyanosis, no use of accessory musculature GI: No organomegaly, abdomen is soft and non-tender Skin: No lesions in the area of chief complaint Neurologic: Sensation intact distally Psychiatric: Patient is competent for consent with normal mood and affect Lymphatic: No axillary or cervical lymphadenopathy  MUSCULOSKELETAL:  General: Alert, comfortable Right upper extremity: Tenderness in the lateral subacromial space, biceps groove tenderness, active forward flexion to 60 degrees limited by discomfort, abduction to 60 degrees limited by discomfort, full passive range of motion, positive Hawkins and impingement signs, resisted rotator cuff strength testing limited by discomfort, right upper extremity is neurovascular intact  Assessment: 80yo RHD male admitted with multiple medical comorbidities including inguinal hernia, left lower lobe pneumonia, new onset A. fib with right shoulder pain related to rotator cuff tendinitis/subacromial bursitis.  Plan: Recommendation was made for oral medication as needed including Tylenol and Advil/NSAIDs as tolerated to help reduce pain and inflammation.  We discussed activity modification including reduced overhead motion.  He does not need to use a sling and can use the arm without restrictions based on his pain level/comfort.  We also discussed follow-up in clinic in the next few weeks and may consider subacromial injection if symptoms persist or worsen.    Renee Harder, MD    03/14/2021 12:52 PM

## 2021-03-14 NOTE — Progress Notes (Signed)
*  PRELIMINARY RESULTS* Echocardiogram 2D Echocardiogram has been performed.  Justin [name] 03/14/2021, 12:04 PM

## 2021-03-14 NOTE — Progress Notes (Signed)
Patient ID: Justin [name], male   DOB: Oct 18, 1940, 80 y.o.   MRN: [id number] [facility] PROGRESS NOTE  Justin [name] EHM:[id number] DOB: March 12, 1941 DOA: 03/10/2021 PCP: Rusty Aus, MD  HPI/Subjective: Patient was happy to be advanced to solid food.  Patient feels okay.  Breathing better than when he came in.  States he takes metoprolol twice a day at home.  Admitted by critical care team for pneumonia.  Had atrial fibrillation yesterday evening and paroxysmal atrial fibrillation this morning.  Patient complaining that he cannot move his right shoulder very well for the last few days.  Pain in his shoulder.  Objective: Vitals:   03/14/21 0921 03/14/21 1130  BP:  134/79  Pulse:  88  Resp:  (!) 22  Temp:  97.9 F (36.6 C)  SpO2: 92% 92%    Intake/Output Summary (Last 24 hours) at 03/14/2021 1255 Last data filed at 03/14/2021 1037 Gross per 24 hour  Intake 899 ml  Output 200 ml  Net 699 ml   Filed Weights   03/12/21 0500 03/13/21 0410 03/14/21 0500  Weight: 84.3 kg 81.7 kg 78.3 kg    ROS: Review of Systems  Respiratory:  Negative for cough and shortness of breath.   Cardiovascular:  Negative for chest pain.  Gastrointestinal:  Negative for abdominal pain, nausea and vomiting.  Musculoskeletal:  Positive for joint pain.  Exam: Physical Exam HENT:     Head: Normocephalic.     Mouth/Throat:     Pharynx: No oropharyngeal exudate.  Eyes:     General: Lids are normal.     Conjunctiva/sclera: Conjunctivae normal.     Pupils: Pupils are equal, round, and reactive to light.  Cardiovascular:     Rate and Rhythm: Normal rate. Rhythm irregularly irregular.     Heart sounds: Normal heart sounds, S1 normal and S2 normal.  Pulmonary:     Breath sounds: Examination of the right-lower field reveals decreased breath sounds. Examination of the left-lower field reveals decreased breath sounds. Decreased breath sounds present. No wheezing, rhonchi or rales.  Abdominal:      Palpations: Abdomen is soft.     Tenderness: There is no abdominal tenderness.  Musculoskeletal:     Right lower leg: No swelling.     Left lower leg: No swelling.  Skin:    Findings: No rash.     Comments: Feet cool. Bruising upper and lower extremities.  Neurological:     Mental Status: He is alert and oriented to person, place, and time.     Data Reviewed: Basic Metabolic Panel: Recent Labs  Lab 03/10/21 0843 03/11/21 0730 03/12/21 0508 03/13/21 0351 03/14/21 0441  NA 136 137 139 139 139  K 4.1 3.5 3.6 3.6 3.5  CL 99 101 106 108 106  CO2 25 28 23 27 27   GLUCOSE 138* 91 77 88 88  BUN 21 23 19 14 14   CREATININE 2.10* 1.54* 1.17 1.00 0.93  CALCIUM 9.4 8.3* 8.5* 8.5* 8.6*  MG  --  1.9 2.2 2.1 2.0  PHOS  --  3.8 2.5 2.0* 2.5    CBC: Recent Labs  Lab 03/10/21 0843 03/11/21 0730 03/12/21 0508 03/13/21 0351 03/14/21 0441  WBC 3.8* 14.0* 14.4* 12.7* 5.8  HGB 12.9* 11.8* 11.8* 11.1* 11.3*  HCT 38.7* 36.7* 35.7* 33.4* 34.2*  MCV 97.0 99.5 96.7 95.7 97.4  PLT 105* 76* 64* 65* 67*    BNP (last 3 results) Recent Labs    03/22/20 1157 03/30/20 0937 04/18/20  1010  BNP 157.9* 256.3* 235.8*     Recent Results (from the past 240 hour(s))  Microscopic Examination     Status: Abnormal   Collection Time: 03/05/21 11:14 AM   Urine  Result Value Ref Range Status   WBC, UA 0-5 0 - 5 /hpf Final   RBC 0-2 0 - 2 /hpf Final   Epithelial Cells (non renal) None seen 0 - 10 /hpf Final   Casts Present (A) None seen /lpf Final   Cast Type Hyaline casts N/A Final   Bacteria, UA None seen None seen/Few Final  Resp Panel by RT-PCR (Flu A&B, Covid) Nasopharyngeal Swab     Status: None   Collection Time: 03/10/21  9:45 AM   Specimen: Nasopharyngeal Swab; Nasopharyngeal(NP) swabs in vial transport medium  Result Value Ref Range Status   SARS Coronavirus 2 by RT PCR NEGATIVE NEGATIVE Final    Comment: (NOTE) SARS-CoV-2 target nucleic acids are NOT DETECTED.  The SARS-CoV-2  RNA is generally detectable in upper respiratory specimens during the acute phase of infection. The lowest concentration of SARS-CoV-2 viral copies this assay can detect is 138 copies/mL. A negative result does not preclude SARS-Cov-2 infection and should not be used as the sole basis for treatment or other patient management decisions. A negative result may occur with  improper specimen collection/handling, submission of specimen other than nasopharyngeal swab, presence of viral mutation(s) within the areas targeted by this assay, and inadequate number of viral copies(<138 copies/mL). A negative result must be combined with clinical observations, patient history, and epidemiological information. The expected result is Negative.  Fact Sheet for Patients:  EntrepreneurPulse.com.au  Fact Sheet for Healthcare Providers:  IncredibleEmployment.be  This test is no t yet approved or cleared by the [place] FDA and  has been authorized for detection and/or diagnosis of SARS-CoV-2 by FDA under an Emergency Use Authorization (EUA). This EUA will remain  in effect (meaning this test can be used) for the duration of the COVID-19 declaration under Section 564(b)(1) of the Act, 21 U.S.C.section 360bbb-3(b)(1), unless the authorization is terminated  or revoked sooner.       Influenza A by PCR NEGATIVE NEGATIVE Final   Influenza B by PCR NEGATIVE NEGATIVE Final    Comment: (NOTE) The Xpert Xpress SARS-CoV-2/FLU/RSV plus assay is intended as an aid in the diagnosis of influenza from Nasopharyngeal swab specimens and should not be used as a sole basis for treatment. Nasal washings and aspirates are unacceptable for Xpert Xpress SARS-CoV-2/FLU/RSV testing.  Fact Sheet for Patients: EntrepreneurPulse.com.au  Fact Sheet for Healthcare Providers: IncredibleEmployment.be  This test is not yet approved or cleared by the  [place] FDA and has been authorized for detection and/or diagnosis of SARS-CoV-2 by FDA under an Emergency Use Authorization (EUA). This EUA will remain in effect (meaning this test can be used) for the duration of the COVID-19 declaration under Section 564(b)(1) of the Act, 21 U.S.C. section 360bbb-3(b)(1), unless the authorization is terminated or revoked.  Performed at [facility], [place], [place] [address]   Respiratory (~20 pathogens) panel by PCR     Status: None   Collection Time: 03/10/21 12:57 PM   Specimen: Nasopharyngeal Swab; Respiratory  Result Value Ref Range Status   Adenovirus NOT DETECTED NOT DETECTED Final   Coronavirus 229E NOT DETECTED NOT DETECTED Final    Comment: (NOTE) The Coronavirus on the Respiratory Panel, DOES NOT test for the novel  Coronavirus (2019 nCoV)    Coronavirus  HKU1 NOT DETECTED NOT DETECTED Final   Coronavirus NL63 NOT DETECTED NOT DETECTED Final   Coronavirus OC43 NOT DETECTED NOT DETECTED Final   Metapneumovirus NOT DETECTED NOT DETECTED Final   Rhinovirus / Enterovirus NOT DETECTED NOT DETECTED Final   Influenza A NOT DETECTED NOT DETECTED Final   Influenza B NOT DETECTED NOT DETECTED Final   Parainfluenza Virus 1 NOT DETECTED NOT DETECTED Final   Parainfluenza Virus 2 NOT DETECTED NOT DETECTED Final   Parainfluenza Virus 3 NOT DETECTED NOT DETECTED Final   Parainfluenza Virus 4 NOT DETECTED NOT DETECTED Final   Respiratory Syncytial Virus NOT DETECTED NOT DETECTED Final   Bordetella pertussis NOT DETECTED NOT DETECTED Final   Bordetella Parapertussis NOT DETECTED NOT DETECTED Final   Chlamydophila pneumoniae NOT DETECTED NOT DETECTED Final   Mycoplasma pneumoniae NOT DETECTED NOT DETECTED Final    Comment: Performed at [facility], [place] [address] [place], [place] [address]  MRSA culture     Status: None   Collection Time: 03/10/21  1:38 PM   Specimen: Body Fluid  Result Value Ref Range  Status   Specimen Description   Final    NASAL SWAB Performed at [facility], [place] [address] [place], [place] [address]    Special Requests   Final    NONE Performed at [facility], [address], [place], [place] [address]    Culture   Final    NO MRSA DETECTED Performed at [facility], [place] [address], [place],  [address]    Report Status 03/13/2021 FINAL  Final     Studies: DG Shoulder Right  Result Date: 03/13/2021 CLINICAL DATA:  Pain. Right upper arm and shoulder pain. No known injury. EXAM: RIGHT SHOULDER - 2+ VIEW COMPARISON:  None. FINDINGS: There is no evidence of fracture or dislocation. Mild acromioclavicular spurring. Unremarkable glenohumeral joint. No erosion, focal bone lesion or bone destruction. Soft tissues are unremarkable. IMPRESSION: Mild acromioclavicular osteoarthritis. Electronically Signed   By: Keith Rake M.D.   On: 03/13/2021 15:29   DG Chest Port 1 View  Result Date: 03/13/2021 CLINICAL DATA:  Acute respiratory failure. EXAM: PORTABLE CHEST 1 VIEW COMPARISON:  03/12/2021. FINDINGS: Prior CABG. Stable cardiomegaly. Low lung volumes. Persistent left lung infiltrate/edema and small left pleural effusion. No interim change. No pneumothorax. Prior vertebroplasties again noted. IMPRESSION: 1.  Prior CABG.  Stable cardiomegaly. 2. Low lung volumes. Persistent left lung infiltrate/edema and small left pleural effusion. No interim change. Electronically Signed   By: Marcello Moores  Register   On: 03/13/2021 05:27   DG Humerus Right  Result Date: 03/13/2021 CLINICAL DATA:  Pain. Right upper arm and shoulder pain. No known injury. EXAM: RIGHT HUMERUS - 2+ VIEW COMPARISON:  None. FINDINGS: Cortical margins of the humerus are intact. There is no evidence of fracture or other focal bone lesions. Elbow alignment is maintained. Soft tissues are unremarkable. IMPRESSION: Negative radiographs of the right humerus. Electronically Signed   By:  Keith Rake M.D.   On: 03/13/2021 15:29   ECHOCARDIOGRAM COMPLETE  Result Date: 03/14/2021    ECHOCARDIOGRAM REPORT   Patient Name:   DE JAWORSKI Date of Exam: 03/14/2021 Medical Rec #:  [id number]      Height:       68.0 in Accession #:    [id number]     Weight:       172.6 lb Date of Birth:  [birth year]       BSA:  1.920 m Patient Age:    16 years       BP:           145/69 mmHg Patient Gender: M              HR:           88 bpm. Exam Location:  [facility] Procedure: 2D Echo, Cardiac Doppler and Color Doppler Indications:     Atrial Fibrillation I48.91  History:         Patient has prior history of Echocardiogram examinations, most                  recent 11/05/2015. CAD and Previous Myocardial Infarction, Prior                  CABG, COPD; Risk Factors:Hypertension.  Sonographer:     Sherrie Sport RDCS (AE) Referring Phys:  [id number] Clabe Seal Diagnosing Phys: Isaias Cowman MD  Sonographer Comments: Suboptimal apical window and no subcostal window. IMPRESSIONS  1. 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 The left ventricle demonstrates regional wall motion abnormalities (see scoring diagram/findings for description). Left ventricular  diastolic parameters are indeterminate.  2. Right ventricular systolic function is normal. The right ventricular size is normal.  3. The mitral valve is normal in structure. Mild mitral valve regurgitation. No evidence of mitral stenosis.  4. The aortic valve is normal in structure. Aortic valve regurgitation is not visualized. No aortic stenosis is present.  5. The inferior vena cava is normal in size with greater than 50% respiratory variability, suggesting right atrial pressure of 3 mmHg. 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 The left ventricle demonstrates regional wall motion  abnormalities. The left ventricular internal cavity size was normal in size. There is no left ventricular hypertrophy. Left ventricular diastolic parameters are indeterminate. Right Ventricle: The right ventricular size is normal. No increase in right ventricular wall thickness. Right ventricular systolic function is normal. Left Atrium: Left atrial size was normal in size. Right Atrium: Right atrial size was normal in size. Pericardium: There is no evidence of pericardial effusion. Mitral Valve: The mitral valve is normal in structure. Mild mitral valve regurgitation. No evidence of mitral valve stenosis. Tricuspid Valve: The tricuspid valve is normal in structure. Tricuspid valve regurgitation is mild . No evidence of tricuspid stenosis. Aortic Valve: The aortic valve is normal in structure. Aortic valve regurgitation is not visualized. No aortic stenosis is present. Aortic valve mean gradient measures 2.0 mmHg. Aortic valve peak gradient measures 5.1 mmHg. Aortic valve area, by VTI measures 3.02 cm. Pulmonic Valve: The pulmonic valve was normal in structure. Pulmonic valve regurgitation is not visualized. No evidence of pulmonic stenosis. Aorta: The aortic root is normal in size and structure. Venous: The inferior vena cava is normal in size with greater than 50% respiratory variability, suggesting right atrial pressure of 3 mmHg. IAS/Shunts: No atrial level shunt detected by color flow Doppler.  LEFT VENTRICLE PLAX 2D LVIDd:         4.31 cm LVIDs:         3.65 cm LV PW:         1.16 cm LV IVS:        0.96 cm LVOT diam:     2.00 cm LV SV:         47 LV SV  Index:   24 LVOT Area:     3.14 cm  RIGHT VENTRICLE RV Basal diam:  4.60 cm RV S prime:     9.68 cm/s TAPSE (M-mode): 3.1 cm LEFT ATRIUM             Index       RIGHT ATRIUM           Index LA diam:        3.80 cm 1.98 cm/m  RA Area:     22.60 cm LA Vol (A2C):   49.1 ml 25.58 ml/m RA Volume:   69.20 ml  36.05 ml/m LA Vol (A4C):   62.5 ml 32.56 ml/m LA  Biplane Vol: 55.9 ml 29.12 ml/m  AORTIC VALVE                   PULMONIC VALVE AV Area (Vmax):    2.27 cm    PV Vmax:        0.65 m/s AV Area (Vmean):   2.89 cm    PV Peak grad:   1.7 mmHg AV Area (VTI):     3.02 cm    RVOT Peak grad: 4 mmHg AV Vmax:           113.00 cm/s AV Vmean:          66.100 cm/s AV VTI:            0.155 m AV Peak Grad:      5.1 mmHg AV Mean Grad:      2.0 mmHg LVOT Vmax:         81.60 cm/s LVOT Vmean:        60.900 cm/s LVOT VTI:          0.149 m LVOT/AV VTI ratio: 0.96  AORTA Ao Root diam: 3.37 cm MITRAL VALVE               TRICUSPID VALVE MV Area (PHT): 7.37 cm    TR Peak grad:   26.6 mmHg MV Decel Time: 103 msec    TR Vmax:        258.00 cm/s MV E velocity: 89.10 cm/s                            SHUNTS                            Systemic VTI:  0.15 m                            Systemic Diam: 2.00 cm Isaias Cowman MD Electronically signed by Isaias Cowman MD Signature Date/Time: 03/14/2021/12:55:21 PM    Final     Scheduled Meds:  arformoterol  15 mcg Nebulization BID   And   umeclidinium bromide  1 puff Inhalation Daily   Chlorhexidine Gluconate Cloth  6 each Topical Q0600   enoxaparin (LOVENOX) injection  40 mg Subcutaneous Q24H   famotidine  20 mg Oral BID   [START ON 03/15/2021] metoprolol tartrate  25 mg Oral BID   Continuous Infusions:  sodium chloride 999 mL/hr at 03/10/21 2233   sodium chloride     piperacillin-tazobactam (ZOSYN)  IV 3.375 g (03/14/21 1233)    Assessment/Plan:  Septic shock and severe sepsis present on admission.  On Zosyn for lobar pneumonia.  Off pressors.  Lactic acidosis Acute hypoxic respiratory failure.  Tapered to room air today Atrial fibrillation with rapid ventricular response.  Paroxysmal in nature.  Restarted metoprolol today.  Hesitant with anticoagulation with platelets being on the lower side.  Appreciate cardiology evaluation. Right shoulder pain with limited range of motion.  Appreciate orthopedic  consultation Thrombocytopenia.  Ordered a peripheral smear.  We will check hepatitis C tomorrow.  Could be secondary to sepsis. Reducible left inguinal hernia.  Seen by Dr. Adora Fridge and no plans for surgical intervention Diet advanced to solid food Physical therapy evaluation    Code Status:     Code Status Orders  (From admission, onward)           Start     Ordered   03/10/21 1245  Full code  Continuous        03/10/21 1246           Code Status History     Date Active Date Inactive Code Status Order ID Comments User Context   03/23/2020 1334 03/23/2020 1912 Full Code [id number]  Hessie Knows, MD Inpatient   09/28/2018 0949 09/28/2018 1429 Full Code [id number]  Teodoro Spray, MD Inpatient   11/03/2015 1341 11/08/2015 1606 Full Code [id number]  Hillary Bow, MD ED      Family Communication: Left message for patient's wife Disposition Plan: Status is: Inpatient  Dispo: The patient is from: Home              Anticipated d/c is to: Home potentially tomorrow if doing better              Patient currently doing better than when he was in the ICU.  Start ambulating today   Difficult to place patient.  No.  Consultants: Cardiology Orthopedic surgery  Antibiotics: Zosyn  Time spent: 28 minutes  [place]

## 2021-03-14 NOTE — Consult Note (Signed)
CARDIOLOGY CONSULT NOTE               Patient ID: Justin [name] MRN: [id number] DOB/AGE: 80-Dec-1942 80 y.o.  Admit date: 03/10/2021 Referring Physician Leslye Peer Primary Physician [facility] Primary Cardiologist Fath Reason for Consultation new onset atrial fibrillation  HPI: 80 year old male referred for evaluation of new onset atrial fibrillation/flutter. The patient has a known history of coronary artery disease status post CABG in 1992, NSTEMI, with recent LHC in 2020 revealing patent left main, patent LIMA to LAD, chronically occluded SVG to diagonal, patent SVG to distal RCA, with patent stents in the mid vein graft with preserved LV function. He also has a history of hypertension, CKD, hyperlipidemia, emphysema, and OSA on CPAP. The patient presented to [facility] with acute onset of dyspnea, vomiting, abdominal pain, severe hypotension, found to be in circulatory shock, with an incarcerated inguinal hernia. CT showed left lower lobe pneumonia. The hernia spontaneously reduced with no need for emergent surgical intervention. While in the ICU, the patient's metoprolol was held and he was on Levophed. After being stabilized and moved to step-down, the patient was noted to be in new onset atrial fibrillation/flutter with rates as high as 140 bpm, which improved after IV Lopressor. His metoprolol tartrate 25 mg BID was resumed. The patient has a chads vasc score of 4. Labs are notable for platelets of 67. Previous echocardiogram in 03/2020 revealed normal left ventricular function with LVEF greater than 55% with mild valvular insufficiencies. Currently, the patient denies chest pain or palpitations. His main complaint is shortness of breath.   Review of systems complete and found to be negative unless listed above     Past Medical History:  Diagnosis Date   Adenomatous polyps    Anginal pain ([place])    Arthritis    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   BPH (benign prostatic hyperplasia)    CAD  (coronary artery disease)    Cataract    Chronic kidney disease    COPD (chronic obstructive pulmonary disease) (HCC)    DJD (degenerative joint disease)    Does use hearing aid    bilateral   Dyspnea    Dysrhythmia    GERD (gastroesophageal reflux disease)    History of kidney stones    History of open heart surgery    Hypertension    Myocardial infarction [facility])    Osteoporosis    Prostate cancer ([place])    Skin cancer    Sleep apnea    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   Wears dentures    partial upper and lower    Past Surgical History:  Procedure Laterality Date   BACK SURGERY     CARDIAC CATHETERIZATION N/A 11/07/2015   Procedure: Left Heart Cath and Coronary Angiography;  Surgeon: Teodoro Spray, MD;  Location: [place] CV [facility];  Service: Cardiovascular;  Laterality: N/A;   CARDIAC CATHETERIZATION N/A 11/07/2015   Procedure: Coronary Stent Intervention;  Surgeon: Isaias Cowman, MD;  Location: [place] [facility];  Service: Cardiovascular;  Laterality: N/A;   CATARACT EXTRACTION     CHOLECYSTECTOMY     COLONOSCOPY N/A 06/13/2020   Procedure: COLONOSCOPY;  Surgeon: Toledo, Benay Pike, MD;  Location: [facility] ENDOSCOPY;  Service: Gastroenterology;  Laterality: N/A;   COLONOSCOPY WITH PROPOFOL N/A 03/27/2017   Procedure: COLONOSCOPY WITH PROPOFOL;  Surgeon: Manya Silvas, MD;  Location:  [place] [facility];  Service: Endoscopy;  Laterality: N/A;   CORONARY ANGIOPLASTY     CORONARY ARTERY BYPASS GRAFT     ESOPHAGOGASTRODUODENOSCOPY N/A 01/14/2021   Procedure: ESOPHAGOGASTRODUODENOSCOPY (EGD);  Surgeon: Toledo, Benay Pike, MD;  Location: [facility] ENDOSCOPY;  Service: Gastroenterology;  Laterality: N/A;   ESOPHAGOGASTRODUODENOSCOPY (EGD) WITH PROPOFOL N/A 03/27/2017   Procedure: ESOPHAGOGASTRODUODENOSCOPY (EGD) WITH PROPOFOL;  Surgeon: Manya Silvas, MD;  Location: [facility]  ENDOSCOPY;  Service: Endoscopy;  Laterality: N/A;   EXCISION PARTIAL PHALANX Right 09/10/2017   Procedure: EXCISION PARTIAL PHALANX-2nd toe;  Surgeon: Albertine Patricia, DPM;  Location: [place];  Service: Podiatry;  Laterality: Right;  sleep apnea   EYE SURGERY     HERNIA REMOVED     HERNIA REPAIR     kissing spine syndrome     KYPHOPLASTY N/A 03/23/2020   Procedure: KYPHOPLASTY L1;  Surgeon: Hessie Knows, MD;  Location: [facility] ORS;  Service: Orthopedics;  Laterality: N/A;   KYPHOPLASTY N/A 05/15/2020   Procedure: T 12 Kyphoplasty;  Surgeon: Hessie Knows, MD;  Location: [facility] ORS;  Service: Orthopedics;  Laterality: N/A;   kyphoplasty t10 t11     LEFT HEART CATH AND CORONARY ANGIOGRAPHY Left 09/28/2018   Procedure: LEFT HEART CATH AND CORONARY ANGIOGRAPHY;  Surgeon: Teodoro Spray, MD;  Location: [place] CV LAB;  Service: Cardiovascular;  Laterality: Left;   OPEN HEART SURGERY     RETROPUBIC PROSTATECTOMY     SKIN CANCER REMOVED      Medications Prior to Admission  Medication Sig Dispense Refill Last Dose   nitroGLYCERIN (NITROSTAT) 0.4 MG SL tablet Place 0.4 mg under the tongue every 5 (five) minutes x 3 doses as needed for chest pain. If no relief call MD or go to emergency room.      traMADol (ULTRAM) 50 MG tablet Take 50 mg by mouth in the morning.   03/09/2021   acetaminophen (TYLENOL) 500 MG tablet Take 1,000 mg by mouth every 8 (eight) hours as needed.   03/09/2021   albuterol (PROVENTIL HFA;VENTOLIN HFA) 108 (90 Base) MCG/ACT inhaler Inhale 2 puffs into the lungs every 4 (four) hours as needed for wheezing or shortness of breath.   03/09/2021   aspirin EC 81 MG tablet Take 81 mg by mouth daily.   03/09/2021   atorvastatin (LIPITOR) 80 MG tablet Take 40 mg by mouth at bedtime.    03/09/2021   Calcium Carbonate-Vitamin D 600-400 MG-UNIT tablet Take 1 tablet by mouth 2 (two) times daily.   03/09/2021   cholecalciferol (VITAMIN D) 1000 units tablet Take 3,000 Units by mouth  daily.    03/09/2021   cyanocobalamin 1000 MCG tablet Take 1,000 mcg by mouth at bedtime.    03/09/2021   fexofenadine (ALLEGRA) 180 MG tablet Take 180 mg by mouth daily before breakfast.    03/09/2021   fluticasone (FLONASE) 50 MCG/ACT nasal spray Place 2 sprays into both nostrils at bedtime.   03/09/2021   gabapentin (NEURONTIN) 300 MG capsule Take 300 mg by mouth at bedtime.    03/09/2021   hydrocortisone (ANUSOL-HC) 25 MG suppository Place 25 mg rectally 2 (two) times daily as needed for hemorrhoids or anal itching.    03/09/2021   ibuprofen (ADVIL) 200 MG tablet Take 400 mg by mouth 3 (three) times daily.   03/09/2021   isosorbide mononitrate (IMDUR) 60 MG 24 hr tablet Take 60 mg by mouth daily.   03/09/2021   Magnesium 400 MG CAPS Take 400 mg by mouth 2 (two)  times daily.    03/09/2021   metoprolol tartrate (LOPRESSOR) 25 MG tablet Take 25 mg by mouth 2 (two) times daily.    03/09/2021   Multiple Vitamins-Minerals (EMERGEN-C VITAMIN C) PACK Take 1 packet by mouth daily.   03/09/2021   niacin 500 MG tablet Take 500 mg by mouth 3 (three) times daily.    03/09/2021   Omega-3 Fatty Acids (FISH OIL) 1000 MG CAPS Take 1,000 mg by mouth 2 (two) times daily.   03/09/2021   omeprazole (PRILOSEC) 40 MG capsule Take 1 capsule by mouth 2 (two) times daily.   03/09/2021   potassium chloride SA (K-DUR,KLOR-CON) 20 MEQ tablet Take 20 mEq by mouth 2 (two) times daily.    03/09/2021   Probiotic CAPS Take 2 capsules by mouth at bedtime.   03/09/2021   senna-docusate (SENOKOT-S) 8.6-50 MG tablet Take 2 tablets by mouth 2 (two) times daily.   03/09/2021   sucralfate (CARAFATE) 1 g tablet Take 1 g by mouth 4 (four) times daily.   03/09/2021   TART CHERRY PO Take 1 capsule by mouth daily.   03/09/2021   Tiotropium Bromide-Olodaterol 2.5-2.5 MCG/ACT AERS Inhale 2 puffs into the lungs daily.   03/09/2021   torsemide (DEMADEX) 20 MG tablet Take 60 mg by mouth daily.    03/09/2021   Wheat Dextrin (BENEFIBER DRINK MIX PO) Take 1 Dose by  mouth daily.    03/09/2021   Social History   Socioeconomic History   Marital status: Married    Spouse name: Not on file   Number of children: Not on file   Years of education: Not on file   Highest education level: Not on file  Occupational History   Not on file  Tobacco Use   Smoking status: Former    Years: 35.00    Pack years: 0.00    Types: Cigarettes    Quit date: 06/26/1990    Years since quitting: 30.7   Smokeless tobacco: Never  Vaping Use   Vaping Use: Never used  Substance and Sexual Activity   Alcohol use: No    Alcohol/week: 0.0 standard drinks   Drug use: No   Sexual activity: Not on file  Other Topics Concern   Not on file  Social History Narrative   Not on file   Social Determinants of Health   Financial Resource Strain: Not on file  Food Insecurity: Not on file  Transportation Needs: Not on file  Physical Activity: Not on file  Stress: Not on file  Social Connections: Not on file  Intimate Partner Violence: Not on file    Family History  Problem Relation Age of Onset   Cancer Father    Prostate cancer Father    Bladder Cancer Neg Hx    Kidney cancer Neg Hx       Review of systems complete and found to be negative unless listed above      PHYSICAL EXAM  General: Ill-appearing, in no acute distress, lying in bed HEENT:  Normocephalic and atramatic Neck:  No JVD.  Lungs: decreased breath sound throughout, conversational dyspnea, supplemental oxygen via [place] Heart: irregularly irregular without gallops or murmurs.  Abdomen: no obvious distention Extremities: No clubbing, cyanosis or edema.   Neuro: Alert and oriented X 3. Psych:  Good affect, responds appropriately  Labs:   Lab Results  Component Value Date   WBC 5.8 03/14/2021   HGB 11.3 (L) 03/14/2021   HCT 34.2 (L) 03/14/2021   MCV 97.4  03/14/2021   PLT 67 (L) 03/14/2021    Recent Labs  Lab 03/14/21 0441  NA 139  K 3.5  CL 106  CO2 27  BUN 14  CREATININE 0.93  CALCIUM  8.6*  GLUCOSE 88   Lab Results  Component Value Date   CKTOTAL 382 11/04/2015   CKMB 2.5 11/04/2015   TROPONINI 9.23 (H) 11/05/2015    Lab Results  Component Value Date   CHOL 98 11/04/2015   Lab Results  Component Value Date   HDL 56 11/04/2015   Lab Results  Component Value Date   LDLCALC 29 11/04/2015   Lab Results  Component Value Date   TRIG 64 11/04/2015   Lab Results  Component Value Date   CHOLHDL 1.8 11/04/2015   No results found for: LDLDIRECT    Radiology: DG Shoulder Right  Result Date: 03/13/2021 CLINICAL DATA:  Pain. Right upper arm and shoulder pain. No known injury. EXAM: RIGHT SHOULDER - 2+ VIEW COMPARISON:  None. FINDINGS: There is no evidence of fracture or dislocation. Mild acromioclavicular spurring. Unremarkable glenohumeral joint. No erosion, focal bone lesion or bone destruction. Soft tissues are unremarkable. IMPRESSION: Mild acromioclavicular osteoarthritis. Electronically Signed   By: Keith Rake M.D.   On: 03/13/2021 15:29   CT ABDOMEN PELVIS W CONTRAST  Result Date: 03/10/2021 CLINICAL DATA:  Patient with nausea and vomiting for the prior 2 days. History of hernia. EXAM: CT ABDOMEN AND PELVIS WITH CONTRAST TECHNIQUE: Multidetector CT imaging of the abdomen and pelvis was performed using the standard protocol following bolus administration of intravenous contrast. CONTRAST:  47mL OMNIPAQUE IOHEXOL 300 MG/ML  SOLN COMPARISON:  CT abdomen pelvis January 10, 2021. FINDINGS: Lower chest: Normal heart size. Patchy consolidative opacities within the left lower lobe and lingula. Minimal ground-glass opacities within the right lower lobe. No pleural effusion Hepatobiliary: Liver is normal in size and contour. Prior cholecystectomy. No intrahepatic or extrahepatic biliary ductal dilatation. Pancreas: Fatty atrophy of the pancreas. Spleen: Unremarkable. Adrenals/Urinary Tract: Normal adrenal glands. Kidneys enhance symmetrically with contrast. 5 mm stone  inferior pole left kidney. 2 cm exophytic cyst mid pole right kidney. Urinary bladder is unremarkable. Stomach/Bowel: Sigmoid colonic diverticulosis. No CT evidence for acute diverticulitis. Normal appendix. Moderate-sized hiatal hernia. Oral contrast material is demonstrated to the mid small bowel located within the central lower abdomen. There are dilated loops of small bowel measuring up to 4 cm within the lower abdomen (image 73; series 2). Within the left lower quadrant there is a loop of small bowel with wall thickening and associated mesenteric fat stranding/edema. This loop of bowel courses through the left inguinal hernia where there is decompressed small bowel distally. No free intraperitoneal air. Vascular/Lymphatic: Normal caliber abdominal aorta. Peripheral calcified atherosclerotic plaque. No retroperitoneal lymphadenopathy. Reproductive: Status post prostatectomy. Other: Small bowel containing and fat containing left inguinal hernia. Fat containing right inguinal hernia. Musculoskeletal: No aggressive or acute appearing osseous lesions. Lower thoracic and lumbar spine degenerative changes. Multilevel lower thoracic and upper lumbar spine kyphoplasty. IMPRESSION: 1. There is a small bowel containing left inguinal hernia. The small bowel proximal to the hernia is dilated measuring up to 4 cm with associated wall thickening and mesenteric fluid/fat stranding compatible with obstruction and associated inflammation. 2. Patchy consolidative opacities within the left lower lobe most compatible with pneumonia. Recommend follow-up chest radiograph in 6-8 weeks to ensure resolution. 3. These results were called by telephone at the time of interpretation on 03/10/2021 at 10:48 am to provider [facility] ,  who verbally acknowledged these results. Electronically Signed   By: Lovey Newcomer M.D.   On: 03/10/2021 11:02   DG Chest Port 1 View  Result Date: 03/13/2021 CLINICAL DATA:  Acute respiratory failure. EXAM:  PORTABLE CHEST 1 VIEW COMPARISON:  03/12/2021. FINDINGS: Prior CABG. Stable cardiomegaly. Low lung volumes. Persistent left lung infiltrate/edema and small left pleural effusion. No interim change. No pneumothorax. Prior vertebroplasties again noted. IMPRESSION: 1.  Prior CABG.  Stable cardiomegaly. 2. Low lung volumes. Persistent left lung infiltrate/edema and small left pleural effusion. No interim change. Electronically Signed   By: Marcello Moores  Register   On: 03/13/2021 05:27   DG Chest Port 1 View  Result Date: 03/12/2021 CLINICAL DATA:  Respiratory failure. EXAM: PORTABLE CHEST 1 VIEW COMPARISON:  03/11/2021. FINDINGS: Prior CABG. Cardiomegaly. Interval slight clearing of left lung infiltrate/edema and left-sided pleural effusion. Interim clearing of right mid lung infiltrate/edema. No pneumothorax. Prior vertebroplasties again noted. IMPRESSION: 1. Prior CABG. Cardiomegaly. 2. Interval slight clearing of left lung infiltrate/edema left-sided pleural effusion. Interim clearing of right mid lung infiltrate/edema. Findings suggest improving CHF. Electronically Signed   By: Marcello Moores  Register   On: 03/12/2021 11:25   DG Chest Port 1 View  Result Date: 03/11/2021 CLINICAL DATA:  Respiratory failure EXAM: PORTABLE CHEST 1 VIEW COMPARISON:  03/10/2021 FINDINGS: Cardiac shadow is enlarged but stable. Postsurgical changes are seen. Changes of prior vertebral augmentation a shin are noted. Increasing left-sided pleural effusion and underlying infiltrate is seen. IMPRESSION: Worsening left-sided infiltrate and effusion. Electronically Signed   By: Inez Catalina M.D.   On: 03/11/2021 18:02   DG Chest Portable 1 View  Result Date: 03/10/2021 CLINICAL DATA:  NVD for the past 2 days. Pt awoke this am very weak. Per pts daughter he has these episodes every 2 weeks since October. Pt also has hernia EXAM: PORTABLE CHEST - 1 VIEW COMPARISON:  11/03/2015 FINDINGS: Left perihilar and lower lung airspace opacities, new since  previous. The right lower lobe airspace disease seen previously has resolved. Heart size upper limits normal for technique. Aortic Atherosclerosis (ICD10-170.0). Previous CABG. Blunting of left lateral costophrenic angle suggesting small effusion. No pneumothorax. Sternotomy wires. Changes of cement vertebral augmentation in multiple contiguous thoracolumbar vertebral bodies . IMPRESSION: 1. Left perihilar and lower lobe airspace disease suggesting pneumonia. Electronically Signed   By: Lucrezia Europe M.D.   On: 03/10/2021 10:32   DG Abd Portable 2V  Result Date: 03/10/2021 CLINICAL DATA:  Left lower quadrant pain.  Small-bowel obstruction EXAM: PORTABLE ABDOMEN - 2 VIEW COMPARISON:  CT 03/10/2021 FINDINGS: Since the previous CT, there has been interval progression of enteric contrast from the small bowel which is now present throughout the colon. No dilated loops of small bowel are seen. No gross free intraperitoneal air. Multilevel thoracolumbar cement augmentation. IMPRESSION: Interval progression of enteric contrast from the small bowel which is now present throughout the colon. Findings suggestive of partial intermittent versus resolved small-bowel obstruction. Electronically Signed   By: Davina Poke D.O.   On: 03/10/2021 15:16   DG Humerus Right  Result Date: 03/13/2021 CLINICAL DATA:  Pain. Right upper arm and shoulder pain. No known injury. EXAM: RIGHT HUMERUS - 2+ VIEW COMPARISON:  None. FINDINGS: Cortical margins of the humerus are intact. There is no evidence of fracture or other focal bone lesions. Elbow alignment is maintained. Soft tissues are unremarkable. IMPRESSION: Negative radiographs of the right humerus. Electronically Signed   By: Keith Rake M.D.   On: 03/13/2021 15:29  EKG: atrial flutter, 88 bpm   ASSESSMENT AND PLAN:   New onset atrial fibrillation/flutter, in the setting of acute hypoxic respiratory failure, pneumonia and septic shock. The patient's metoprolol was  initially held due to hypotension, and has since been removed. He has a chads vasc score of 4. He has thrombocytopenia with platelets as low as 64. The patient is currently rate controlled and appears asymptomatic. Septic shock, due to pneumonia Pneumonia Hypertension Incarcerated inguinal hernia, which spontaneously reduced, thus no plan for emergent surgical intervention at this time Coronary artery disease status post CABG in 1992 with left heart catheterization in 2020 revealing patent left main, patent LIMA to LAD, chronically occluded SVG to diagonal, patent SVG to distal RCA, with patent stents in the mid vein graft with preserved LV function.  Recommendations: Agree with resuming metoprolol tartrate for rate control; up-titrate as needed for rate control, and as blood pressure permits. Defer initiating chronic anticoagulation at this time in light of thrombocytopenia and atrial fibrillation possibly secondary to reversible causes. 2D echocardiogram Continue to monitor on telemetry  Reviewed with Dr. Saralyn Pilar and Dr. Ubaldo Glassing and plan made in collaboration with him.  Signed: Clabe Seal PA-C 03/14/2021, 11:03 AM

## 2021-03-15 DIAGNOSIS — M25511 Pain in right shoulder: Secondary | ICD-10-CM

## 2021-03-15 LAB — BASIC METABOLIC PANEL
Anion gap: 6 (ref 5–15)
BUN: 18 mg/dL (ref 8–23)
CO2: 26 mmol/L (ref 22–32)
Calcium: 8.4 mg/dL — ABNORMAL LOW (ref 8.9–10.3)
Chloride: 108 mmol/L (ref 98–111)
Creatinine, Ser: 0.94 mg/dL (ref 0.61–1.24)
GFR, Estimated: >60 mL/min (ref >60)
Glucose, Bld: 136 mg/dL — ABNORMAL HIGH (ref 70–99)
Potassium: 3.9 mmol/L (ref 3.5–5.1)
Sodium: 140 mmol/L (ref 135–145)

## 2021-03-15 LAB — CBC
HCT: 31.9 % — ABNORMAL LOW (ref 39.0–52.0)
Hemoglobin: 10.5 g/dL — ABNORMAL LOW (ref 13.0–17.0)
MCH: 31.3 pg (ref 26.0–34.0)
MCHC: 32.9 g/dL (ref 30.0–36.0)
MCV: 95.2 fL (ref 80.0–100.0)
Platelets: 74 10*3/uL — ABNORMAL LOW (ref 150–400)
RBC: 3.35 MIL/uL — ABNORMAL LOW (ref 4.22–5.81)
RDW: 14.2 % (ref 11.5–15.5)
WBC: 4.2 10*3/uL (ref 4.0–10.5)
nRBC: 0 % (ref 0.0–0.2)

## 2021-03-15 LAB — HEPATITIS C ANTIBODY: HCV Ab: NONREACTIVE

## 2021-03-15 MED ORDER — AMOXICILLIN-POT CLAVULANATE 875-125 MG PO TABS: 1.0000 | ORAL_TABLET | Freq: Two times a day (BID) | ORAL | 0 refills | Status: AC

## 2021-03-15 MED ORDER — ONDANSETRON HCL 4 MG PO TABS: 4.0000 mg | ORAL_TABLET | Freq: Three times a day (TID) | ORAL | 0 refills | Status: DC | PRN

## 2021-03-15 MED ORDER — POTASSIUM CHLORIDE CRYS ER 20 MEQ PO TBCR: 20.0000 meq | EXTENDED_RELEASE_TABLET | Freq: Every day | ORAL | Status: DC

## 2021-03-15 MED ORDER — PREDNISONE 10 MG PO TABS: ORAL_TABLET | ORAL | 0 refills | Status: DC

## 2021-03-15 MED ORDER — TORSEMIDE 20 MG PO TABS
20.0000 mg | ORAL_TABLET | Freq: Every day | ORAL | Status: DC
  Administered 2021-03-15: 20 mg via ORAL
  Filled 2021-03-15: qty 1

## 2021-03-15 MED ORDER — POLYETHYLENE GLYCOL 3350 17 G PO PACK: 17.0000 g | PACK | Freq: Every day | ORAL | 0 refills | Status: AC | PRN

## 2021-03-15 MED ORDER — POTASSIUM CHLORIDE CRYS ER 10 MEQ PO TBCR
10.0000 meq | EXTENDED_RELEASE_TABLET | Freq: Every day | ORAL | Status: DC
  Administered 2021-03-15: 10:00:00 10 meq via ORAL
  Filled 2021-03-15: qty 1

## 2021-03-15 NOTE — Discharge Summary (Signed)
[facility] [place] NAME: Justin [name]    MR#:  [id number]  [place] OF BIRTH:  10-16-1940  DATE OF ADMISSION:  03/10/2021 ADMITTING PHYSICIAN: Ottie Glazier, MD  DATE OF DISCHARGE: 03/15/2021  1:30 PM  PRIMARY CARE PHYSICIAN: Rusty Aus, MD    ADMISSION DIAGNOSIS:  SBO (small bowel obstruction) ([place]) [K56.609] Septic shock ([place]) [A41.9, R65.21] Incarcerated left inguinal hernia [K40.30] Community acquired pneumonia of left lower lobe of lung [J18.9] Left lower lobe pulmonary infiltrate [R91.8]  DISCHARGE DIAGNOSIS:  Active Problems:   Lobar pneumonia (HCC)   Left lower lobe pulmonary infiltrate   Septic shock (HCC)   Acute respiratory failure with hypoxia (HCC)   Atrial fibrillation with RVR (HCC)   Thrombocytopenia (HCC)   Left inguinal hernia   SECONDARY DIAGNOSIS:   Past Medical History:  Diagnosis Date   Adenomatous polyps    Anginal pain (HCC)    Arthritis    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   BPH (benign prostatic hyperplasia)    CAD (coronary artery disease)    Cataract    Chronic kidney disease    COPD (chronic obstructive pulmonary disease) (HCC)    DJD (degenerative joint disease)    Does use hearing aid    bilateral   Dyspnea    Dysrhythmia    GERD (gastroesophageal reflux disease)    History of kidney stones    History of open heart surgery    Hypertension    Myocardial infarction [facility])    Osteoporosis    Prostate cancer ([place])    Skin cancer    Sleep apnea    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  The patient was on Zosyn for lobar pneumonia.  Patient off pressors.  Had lactic acidosis.  Patient was initially on the critical care service and transferred to the medicine service  on 03/14/2021.  We will switch over to Augmentin upon disposition for 3 more doses. Acute hypoxic respiratory failure.  Tapered off oxygen on 03/14/2021. Atrial fibrillation with rapid ventricular response.  Paroxysmal in nature.  Improved when restarting metoprolol on 03/14/2021.  Hesitant on anticoagulation with platelets being on the lower side.  Seen by cardiology here in the hospital and will follow up with Dr. Ubaldo Glassing as outpatient.  EF on this echo likely low because he was in atrial fibrillation at the time of the echocardiogram.  Repeat echo as outpatient.  This morning was currently in normal sinus rhythm. Right shoulder pain with limited range of motion.  I appreciate orthopedic consultation.  I gave systemic steroids and able to move his right shoulder a little bit better today.  We will give a prednisone taper upon getting on the hospital.  Follow-up with PT and OT with home health.  Follow-up with orthopedic surgery as outpatient. Thrombocytopenia.  Peripheral smear did not show any platelet clumping or any signs of hemolysis.  Platelet count likely low secondary to septic shock.  Continue to monitor as outpatient.  Hepatitis C negative.  Last platelet count 74. Reducible left inguinal hernia.  Seen by Dr. Dahlia Byes.  No surgical intervention planned on this hospital stay.  Can follow-up as outpatient. Resolved partial small bowel obstruction likely from inguinal  hernia.  Diet advanced to solid food and he is tolerating and he states that he has been having bowel movements daily. Weakness.  Physical therapy recommended home health  DISCHARGE CONDITIONS:   Satisfactory  CONSULTS OBTAINED:  Treatment Team:  Renee Harder, MD  DRUG ALLERGIES:   Allergies  Allergen Reactions   Hydrochlorothiazide W-Triamterene Nausea Only    DISCHARGE MEDICATIONS:   Allergies as of 03/15/2021       Reactions   Hydrochlorothiazide W-triamterene Nausea Only        Medication List     STOP taking  these medications    ibuprofen 200 MG tablet Commonly known as: ADVIL       TAKE these medications    acetaminophen 500 MG tablet Commonly known as: TYLENOL Take 1,000 mg by mouth every 8 (eight) hours as needed.   albuterol 108 (90 Base) MCG/ACT inhaler Commonly known as: VENTOLIN HFA Inhale 2 puffs into the lungs every 4 (four) hours as needed for wheezing or shortness of breath.   amoxicillin-clavulanate 875-125 MG tablet Commonly known as: Augmentin Take 1 tablet by mouth 2 (two) times daily for 3 doses.   aspirin EC 81 MG tablet Take 81 mg by mouth daily.   atorvastatin 80 MG tablet Commonly known as: LIPITOR Take 40 mg by mouth at bedtime.   BENEFIBER DRINK MIX PO Take 1 Dose by mouth daily.   Calcium Carbonate-Vitamin D 600-400 MG-UNIT tablet Take 1 tablet by mouth 2 (two) times daily.   cholecalciferol 25 MCG (1000 UNIT) tablet Commonly known as: VITAMIN D Take 3,000 Units by mouth daily.   cyanocobalamin 1000 MCG tablet Take 1,000 mcg by mouth at bedtime.   Emergen-C Vitamin C Pack Take 1 packet by mouth daily.   fexofenadine 180 MG tablet Commonly known as: ALLEGRA Take 180 mg by mouth daily before breakfast.   Fish Oil 1000 MG Caps Take 1,000 mg by mouth 2 (two) times daily.   fluticasone 50 MCG/ACT nasal spray Commonly known as: FLONASE Place 2 sprays into both nostrils at bedtime.   gabapentin 300 MG capsule Commonly known as: NEURONTIN Take 300 mg by mouth at bedtime.   hydrocortisone 25 MG suppository Commonly known as: ANUSOL-HC Place 25 mg rectally 2 (two) times daily as needed for hemorrhoids or anal itching.   isosorbide mononitrate 60 MG 24 hr tablet Commonly known as: IMDUR Take 60 mg by mouth daily.   Magnesium 400 MG Caps Take 400 mg by mouth 2 (two) times daily.   metoprolol tartrate 25 MG tablet Commonly known as: LOPRESSOR Take 25 mg by mouth 2 (two) times daily.   niacin 500 MG tablet Take 500 mg by mouth 3 (three)  times daily.   nitroGLYCERIN 0.4 MG SL tablet Commonly known as: NITROSTAT Place 0.4 mg under the tongue every 5 (five) minutes x 3 doses as needed for chest pain. If no relief call MD or go to emergency room.   omeprazole 40 MG capsule Commonly known as: PRILOSEC Take 1 capsule by mouth 2 (two) times daily.   ondansetron 4 MG tablet Commonly known as: Zofran Take 1 tablet (4 mg total) by mouth every 8 (eight) hours as needed for nausea or vomiting.   polyethylene glycol 17 g packet Commonly known as: MIRALAX / GLYCOLAX Take 17 g by mouth daily as needed for moderate constipation.   potassium chloride SA 20 MEQ tablet Commonly known as: KLOR-CON Take 1 tablet (20 mEq total) by mouth daily. What changed: when to  take this   predniSONE 10 MG tablet Commonly known as: DELTASONE Two tabs po daily for two days then one tab po daily for three days Start taking on: March 16, 2021   Probiotic Caps Take 2 capsules by mouth at bedtime.   senna-docusate 8.6-50 MG tablet Commonly known as: Senokot-S Take 2 tablets by mouth 2 (two) times daily.   sucralfate 1 g tablet Commonly known as: CARAFATE Take 1 g by mouth 4 (four) times daily.   TART CHERRY PO Take 1 capsule by mouth daily.   Tiotropium Bromide-Olodaterol 2.5-2.5 MCG/ACT Aers Inhale 2 puffs into the lungs daily.   torsemide 20 MG tablet Commonly known as: DEMADEX Take 60 mg by mouth daily.   traMADol 50 MG tablet Commonly known as: ULTRAM Take 50 mg by mouth in the morning.               Durable Medical Equipment  (From admission, onward)           Start     Ordered   03/14/21 1137  For home use only DME Walker rolling  Once       Question Answer Comment  Walker: With 5 Inch Wheels   Patient needs a walker to treat with the following condition Weakness generalized      03/14/21 1137             DISCHARGE INSTRUCTIONS:   Follow-up PMD 5 days Follow-up Dr. Ubaldo Glassing cardiology Follow-up  orthopedic surgery Follow-up general surgery  If you experience worsening of your admission symptoms, develop shortness of breath, life threatening emergency, suicidal or homicidal thoughts you must seek medical attention immediately by calling 911 or calling your MD immediately  if symptoms less severe.  You Must read complete instructions/literature along with all the possible adverse reactions/side effects for all the Medicines you take and that have been prescribed to you. Take any new Medicines after you have completely understood and accept all the possible adverse reactions/side effects.   Please note  You were cared for by a hospitalist during your hospital stay. If you have any questions about your discharge medications or the care you received while you were in the hospital after you are discharged, you can call the unit and asked to speak with the hospitalist on call if the hospitalist that took care of you is not available. Once you are discharged, your primary care physician will handle any further medical issues. Please note that NO REFILLS for any discharge medications will be authorized once you are discharged, as it is imperative that you return to your primary care physician (or establish a relationship with a primary care physician if you do not have one) for your aftercare needs so that they can reassess your need for medications and monitor your lab values.    Today   CHIEF COMPLAINT:   Chief Complaint  Patient presents with   Nausea   Diarrhea   Weakness    HISTORY OF PRESENT ILLNESS:  Justin [name]  is a 80 y.o. male initially came in with nausea diarrhea weakness and found to have septic shock   VITAL SIGNS:  Blood pressure 120/78, pulse (!) 57, temperature 97.9 F (36.6 C), resp. rate 16, height 5\' 8"  (1.727 m), weight 78.4 kg, SpO2 93 %.    PHYSICAL EXAMINATION:  GENERAL:  80 y.o.-year-old patient lying in the bed with no acute distress.  EYES: Pupils  equal, round, reactive to light and accommodation. No scleral icterus.  HEENT: Head atraumatic, normocephalic. Oropharynx and nasopharynx clear.  LUNGS: Normal breath sounds bilaterally, no wheezing, rales,rhonchi or crepitation. No use of accessory muscles of respiration.  CARDIOVASCULAR: S1, S2 normal. No murmurs, rubs, or gallops.  ABDOMEN: Soft, non-tender, non-distended.  EXTREMITIES: No pedal edema.  NEUROLOGIC: Cranial nerves II through XII are intact. Muscle strength 5/5 in all extremities. Sensation intact. Gait not checked.  PSYCHIATRIC: The patient is alert and oriented x 3.  SKIN: No obvious rash, lesion, or ulcer.   DATA REVIEW:   CBC Recent Labs  Lab 03/15/21 0558  WBC 4.2  HGB 10.5*  HCT 31.9*  PLT 74*    Chemistries  Recent Labs  Lab 03/14/21 0441 03/15/21 0558  NA 139 140  K 3.5 3.9  CL 106 108  CO2 27 26  GLUCOSE 88 136*  BUN 14 18  CREATININE 0.93 0.94  CALCIUM 8.6* 8.4*  MG 2.0  --      Microbiology Results  Results for orders placed or performed during the hospital encounter of 03/10/21  Resp Panel by RT-PCR (Flu A&B, Covid) Nasopharyngeal Swab     Status: None   Collection Time: 03/10/21  9:45 AM   Specimen: Nasopharyngeal Swab; Nasopharyngeal(NP) swabs in vial transport medium  Result Value Ref Range Status   SARS Coronavirus 2 by RT PCR NEGATIVE NEGATIVE Final    Comment: (NOTE) SARS-CoV-2 target nucleic acids are NOT DETECTED.  The SARS-CoV-2 RNA is generally detectable in upper respiratory specimens during the acute phase of infection. The lowest concentration of SARS-CoV-2 viral copies this assay can detect is 138 copies/mL. A negative result does not preclude SARS-Cov-2 infection and should not be used as the sole basis for treatment or other patient management decisions. A negative result may occur with  improper specimen collection/handling, submission of specimen other than nasopharyngeal swab, presence of viral mutation(s) within  the areas targeted by this assay, and inadequate number of viral copies(<138 copies/mL). A negative result must be combined with clinical observations, patient history, and epidemiological information. The expected result is Negative.  Fact Sheet for Patients:  EntrepreneurPulse.com.au  Fact Sheet for Healthcare Providers:  IncredibleEmployment.be  This test is no t yet approved or cleared by the [place] FDA and  has been authorized for detection and/or diagnosis of SARS-CoV-2 by FDA under an Emergency Use Authorization (EUA). This EUA will remain  in effect (meaning this test can be used) for the duration of the COVID-19 declaration under Section 564(b)(1) of the Act, 21 U.S.C.section 360bbb-3(b)(1), unless the authorization is terminated  or revoked sooner.       Influenza A by PCR NEGATIVE NEGATIVE Final   Influenza B by PCR NEGATIVE NEGATIVE Final    Comment: (NOTE) The Xpert Xpress SARS-CoV-2/FLU/RSV plus assay is intended as an aid in the diagnosis of influenza from Nasopharyngeal swab specimens and should not be used as a sole basis for treatment. Nasal washings and aspirates are unacceptable for Xpert Xpress SARS-CoV-2/FLU/RSV testing.  Fact Sheet for Patients: EntrepreneurPulse.com.au  Fact Sheet for Healthcare Providers: IncredibleEmployment.be  This test is not yet approved or cleared by the [place] FDA and has been authorized for detection and/or diagnosis of SARS-CoV-2 by FDA under an Emergency Use Authorization (EUA). This EUA will remain in effect (meaning this test can be used) for the duration of the COVID-19 declaration under Section 564(b)(1) of the Act, 21 U.S.C. section 360bbb-3(b)(1), unless the authorization is terminated or revoked.  Performed at [facility], [place],  [place], [place] [address]   Respiratory (~20 pathogens) panel by PCR      Status: None   Collection Time: 03/10/21 12:57 PM   Specimen: Nasopharyngeal Swab; Respiratory  Result Value Ref Range Status   Adenovirus NOT DETECTED NOT DETECTED Final   Coronavirus 229E NOT DETECTED NOT DETECTED Final    Comment: (NOTE) The Coronavirus on the Respiratory Panel, DOES NOT test for the novel  Coronavirus (2019 nCoV)    Coronavirus HKU1 NOT DETECTED NOT DETECTED Final   Coronavirus NL63 NOT DETECTED NOT DETECTED Final   Coronavirus OC43 NOT DETECTED NOT DETECTED Final   Metapneumovirus NOT DETECTED NOT DETECTED Final   Rhinovirus / Enterovirus NOT DETECTED NOT DETECTED Final   Influenza A NOT DETECTED NOT DETECTED Final   Influenza B NOT DETECTED NOT DETECTED Final   Parainfluenza Virus 1 NOT DETECTED NOT DETECTED Final   Parainfluenza Virus 2 NOT DETECTED NOT DETECTED Final   Parainfluenza Virus 3 NOT DETECTED NOT DETECTED Final   Parainfluenza Virus 4 NOT DETECTED NOT DETECTED Final   Respiratory Syncytial Virus NOT DETECTED NOT DETECTED Final   Bordetella pertussis NOT DETECTED NOT DETECTED Final   Bordetella Parapertussis NOT DETECTED NOT DETECTED Final   Chlamydophila pneumoniae NOT DETECTED NOT DETECTED Final   Mycoplasma pneumoniae NOT DETECTED NOT DETECTED Final    Comment: Performed at [facility], [place]. [address] [place], [place] [address]  MRSA culture     Status: None   Collection Time: 03/10/21  1:38 PM   Specimen: Body Fluid  Result Value Ref Range Status   Specimen Description   Final    NASAL SWAB Performed at [facility], [place] [address], [place], [place] [address]    Special Requests   Final    NONE Performed at [facility], [address], [place], [place] [address]    Culture   Final    NO MRSA DETECTED Performed at [facility], [place] [address] [place],  [address]    Report Status 03/13/2021 FINAL  Final    RADIOLOGY:  ECHOCARDIOGRAM COMPLETE  Result Date: 03/14/2021    ECHOCARDIOGRAM REPORT    Patient Name:   Justin [name] Date of Exam: 03/14/2021 Medical Rec #:  [id number]      Height:       68.0 in Accession #:    [id number]     Weight:       172.6 lb Date of Birth:  1940/12/16       BSA:          1.920 m Patient Age:    59 years       BP:           145/69 mmHg Patient Gender: M              HR:           88 bpm. Exam Location:  [facility] Procedure: 2D Echo, Cardiac Doppler and Color Doppler Indications:     Atrial Fibrillation I48.91  History:         Patient has prior history of Echocardiogram examinations, most                  recent 11/05/2015. CAD and Previous Myocardial Infarction, Prior                  CABG, COPD; Risk Factors:Hypertension.  Sonographer:     Sherrie Sport RDCS (AE) Referring Phys:  [phone] [facility]  Diagnosing Phys: Isaias Cowman MD  Sonographer Comments: Suboptimal apical window and no subcostal window. IMPRESSIONS  1. 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 The left ventricle demonstrates regional wall motion abnormalities (see scoring diagram/findings for description). Left ventricular  diastolic parameters are indeterminate.  2. Right ventricular systolic function is normal. The right ventricular size is normal.  3. The mitral valve is normal in structure. Mild mitral valve regurgitation. No evidence of mitral stenosis.  4. The aortic valve is normal in structure. Aortic valve regurgitation is not visualized. No aortic stenosis is present.  5. The inferior vena cava is normal in size with greater than 50% respiratory variability, suggesting right atrial pressure of 3 mmHg. 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 The left ventricle demonstrates regional wall motion abnormalities. The left ventricular internal cavity size was normal in size. There is no left ventricular hypertrophy. Left ventricular  diastolic parameters are indeterminate. Right Ventricle: The right ventricular size is normal. No increase in right ventricular wall thickness. Right ventricular systolic function is normal. Left Atrium: Left atrial size was normal in size. Right Atrium: Right atrial size was normal in size. Pericardium: There is no evidence of pericardial effusion. Mitral Valve: The mitral valve is normal in structure. Mild mitral valve regurgitation. No evidence of mitral valve stenosis. Tricuspid Valve: The tricuspid valve is normal in structure. Tricuspid valve regurgitation is mild . No evidence of tricuspid stenosis. Aortic Valve: The aortic valve is normal in structure. Aortic valve regurgitation is not visualized. No aortic stenosis is present. Aortic valve mean gradient measures 2.0 mmHg. Aortic valve peak gradient measures 5.1 mmHg. Aortic valve area, by VTI measures 3.02 cm. Pulmonic Valve: The pulmonic valve was normal in structure. Pulmonic valve regurgitation is not visualized. No evidence of pulmonic stenosis. Aorta: The aortic root is normal in size and structure. Venous: The inferior vena cava is normal in size with greater than 50% respiratory variability, suggesting right atrial pressure of 3 mmHg. IAS/Shunts: No atrial level shunt detected by color flow Doppler.  LEFT VENTRICLE PLAX 2D LVIDd:         4.31 cm LVIDs:         3.65 cm LV PW:         1.16 cm LV IVS:        0.96 cm LVOT diam:     2.00 cm LV SV:         47 LV SV Index:   24 LVOT Area:     3.14 cm  RIGHT VENTRICLE RV Basal diam:  4.60 cm RV S prime:     9.68 cm/s TAPSE (M-mode): 3.1 cm LEFT ATRIUM             Index       RIGHT ATRIUM           Index LA diam:        3.80 cm 1.98 cm/m  RA Area:     22.60 cm LA Vol (A2C):   49.1 ml 25.58 ml/m RA Volume:   69.20 ml  36.05 ml/m LA Vol (A4C):   62.5 ml 32.56 ml/m LA Biplane Vol: 55.9 ml 29.12 ml/m  AORTIC VALVE                   PULMONIC VALVE AV Area (Vmax):    2.27 cm  PV Vmax:        0.65 m/s AV  Area (Vmean):   2.89 cm    PV Peak grad:   1.7 mmHg AV Area (VTI):     3.02 cm    RVOT Peak grad: 4 mmHg AV Vmax:           113.00 cm/s AV Vmean:          66.100 cm/s AV VTI:            0.155 m AV Peak Grad:      5.1 mmHg AV Mean Grad:      2.0 mmHg LVOT Vmax:         81.60 cm/s LVOT Vmean:        60.900 cm/s LVOT VTI:          0.149 m LVOT/AV VTI ratio: 0.96  AORTA Ao Root diam: 3.37 cm MITRAL VALVE               TRICUSPID VALVE MV Area (PHT): 7.37 cm    TR Peak grad:   26.6 mmHg MV Decel Time: 103 msec    TR Vmax:        258.00 cm/s MV E velocity: 89.10 cm/s                            SHUNTS                            Systemic VTI:  0.15 m                            Systemic Diam: 2.00 cm Isaias Cowman MD Electronically signed by Isaias Cowman MD Signature Date/Time: 03/14/2021/12:55:21 PM    Final       Management plans discussed with the patient, family and they are in agreement.  CODE STATUS:     Code Status Orders  (From admission, onward)           Start     Ordered   03/10/21 1245  Full code  Continuous        03/10/21 1246           Code Status History     Date Active Date Inactive Code Status Order ID Comments User Context   03/23/2020 1334 03/23/2020 1912 Full Code [id number]  Hessie Knows, MD Inpatient   09/28/2018 0949 09/28/2018 1429 Full Code [id number]  Teodoro Spray, MD Inpatient   11/03/2015 1341 11/08/2015 1606 Full Code [id number]  Hillary Bow, MD ED       TOTAL TIME TAKING CARE OF THIS PATIENT: 35 minutes.    Loletha Grayer M.D on 03/15/2021 at 4:58 PM  Between 7am to 6pm - Pager - [phone]  After 6pm go to [email or web address] - password [email or web address]  [facility]  CC: Primary care physician; Rusty Aus, MD

## 2021-03-15 NOTE — Progress Notes (Signed)
Physical Therapy Treatment Patient Details Name: Justin [name] MRN: [id number] DOB: 1941-03-26 Today's Date: 03/15/2021    History of Present Illness 80yo m with NSTEMI, OSA, GERD, CAD, osteoporosis, basal cell ca, BPH with prostate CA, CKD, severe claudication, dyslipidemia, Emphysema who came to ED with complaints of  acute onset of dyspnea, vomiting and abd pain who came in due to abd pain. He was found to be severely hypotensive.  Also with R inguinal hernia and L lung PNA.    PT Comments    Improved gait tolerance with personal shoes on this am.  Pt able to perform bed mobility, transfers, and gait 136ft with RW and ModI/Supervision.  Good safety awareness, no c/o pain.  O2 sats 96% at rest on RA, 92% with exertion. No c/o SOB. Family present for session. Pt ready physically for d/c home with HHPT services. Pt has received RW for home use.   Follow Up Recommendations  Home health PT     Equipment Recommendations  Rolling walker with 5" wheels    Recommendations for Other Services       Precautions / Restrictions Precautions Precautions: Fall Restrictions Weight Bearing Restrictions: No    Mobility  Bed Mobility Overal bed mobility: Modified Independent                  Transfers Overall transfer level: Modified independent Equipment used: Rolling walker (2 wheeled)             General transfer comment:  (NO cueing needed for proper technique)  Ambulation/Gait Ambulation/Gait assistance: Min guard;Supervision Gait Distance (Feet): 160 Feet Assistive device: Rolling walker (2 wheeled);1 person hand held assist Gait Pattern/deviations: WFL(Within Functional Limits)     General Gait Details: good balance and consistent steps with shoes on   Stairs Stairs:  (Pt has elevator at home and chair lift)           Wheelchair Mobility    Modified Rankin (Stroke Patients Only)       Balance                                             Cognition Arousal/Alertness: Awake/alert Behavior During Therapy: WFL for tasks assessed/performed Overall Cognitive Status: Within Functional Limits for tasks assessed                                        Exercises      General Comments        Pertinent Vitals/Pain Pain Assessment: No/denies pain    Home Living                      Prior Function            PT Goals (current goals can now be found in the care plan section) Acute Rehab PT Goals Patient Stated Goal: go home` Progress towards PT goals: Progressing toward goals    Frequency    Min 2X/week      PT Plan Current plan remains appropriate    Co-evaluation              AM-PAC PT "6 Clicks" Mobility   Outcome Measure  Help needed turning from your back to your side while in a flat bed without using bedrails?:  None Help needed moving from lying on your back to sitting on the side of a flat bed without using bedrails?: None Help needed moving to and from a bed to a chair (including a wheelchair)?: None Help needed standing up from a chair using your arms (e.g., wheelchair or bedside chair)?: A Little Help needed to walk in hospital room?: A Little Help needed climbing 3-5 steps with a railing? : A Little 6 Click Score: 21    End of Session Equipment Utilized During Treatment: Gait belt Activity Tolerance: Patient tolerated treatment well Patient left: in chair;with call bell/phone within reach;with chair alarm set;with family/visitor present Nurse Communication: Mobility status PT Visit Diagnosis: Muscle weakness (generalized) (M62.81);Difficulty in walking, not elsewhere classified (R26.2)     Time: 6861-6837 PT Time Calculation (min) (ACUTE ONLY): 26 min  Charges:  $Gait Training: 8-22 mins $Therapeutic Activity: 8-22 mins                     Mikel Cella, PTA    Justin [name] 03/15/2021, 11:47 AM

## 2021-03-15 NOTE — Progress Notes (Signed)
Justin [name] to be D/C'd  per MD order.  Discussed with the patient and all questions fully answered.  VSS, Skin clean, dry and intact without evidence of skin break down, no evidence of skin tears noted.  IV catheter discontinued intact. Site without signs and symptoms of complications. Dressing and pressure applied.  An After Visit Summary was printed and given to the patient. Patient received prescription.  D/c education completed with patient/family including follow up instructions, medication list, d/c activities limitations if indicated, with other d/c instructions as indicated by MD - patient able to verbalize understanding, all questions fully answered.   Patient instructed to return to ED, call 911, or call MD for any changes in condition.   Patient to be escorted via [place], and D/C home via private auto.

## 2021-03-15 NOTE — Progress Notes (Signed)
[facility] Cardiology    SUBJECTIVE: The patient reports feeling better this morning. He is off of supplemental oxygen at present and states that his breathing is better, though does feel short of breath with minimal ambulation in his room. He denies chest pain or palpitations. He voices concern about feeling like he is starting to retain fluid in his ankle and fingers. He is eager to go home.   Vitals:   03/15/21 0021 03/15/21 0432 03/15/21 0436 03/15/21 0832  BP: 117/69 112/82  129/70  Pulse: 69 64  85  Resp: 17 16  16   Temp: 98.4 F (36.9 C) 98.4 F (36.9 C)  97.6 F (36.4 C)  TempSrc: Oral     SpO2: 96% 98%  96%  Weight:   78.4 kg   Height:         Intake/Output Summary (Last 24 hours) at 03/15/2021 4128 Last data filed at 03/14/2021 1855 Gross per 24 hour  Intake 240 ml  Output 200 ml  Net 40 ml      PHYSICAL EXAM  General: Well developed, well nourished, elderly gentleman sitting up in bed in no acute distress HEENT:  Normocephalic and atramatic Neck:  No JVD.  Lungs: normal effort of breathing on room air at rest, decreased breath sounds throughout, mild basilar crackles Heart: HRRR . Normal S1 and S2 without gallops or murmurs.  Extremities: No clubbing, cyanosis, with trivial nonpitting bilateral ankle edema.   Neuro: Alert and oriented X 3. Psych:  Good affect, responds appropriately   LABS: Basic Metabolic Panel: Recent Labs    03/13/21 0351 03/14/21 0441 03/15/21 0558  NA 139 139 140  K 3.6 3.5 3.9  CL 108 106 108  CO2 27 27 26   GLUCOSE 88 88 136*  BUN 14 14 18   CREATININE 1.00 0.93 0.94  CALCIUM 8.5* 8.6* 8.4*  MG 2.1 2.0  --   PHOS 2.0* 2.5  --    Liver Function Tests: No results for input(s): AST, ALT, ALKPHOS, BILITOT, PROT, ALBUMIN in the last 72 hours. No results for input(s): LIPASE, AMYLASE in the last 72 hours. CBC: Recent Labs    03/14/21 0441 03/15/21 0558  WBC 5.8 4.2  HGB 11.3* 10.5*  HCT 34.2* 31.9*  MCV 97.4 95.2  PLT 67* 74*    Cardiac Enzymes: No results for input(s): CKTOTAL, CKMB, CKMBINDEX, TROPONINI in the last 72 hours. BNP: Invalid input(s): POCBNP D-Dimer: No results for input(s): DDIMER in the last 72 hours. Hemoglobin A1C: No results for input(s): HGBA1C in the last 72 hours. Fasting Lipid Panel: No results for input(s): CHOL, HDL, LDLCALC, TRIG, CHOLHDL, LDLDIRECT in the last 72 hours. Thyroid Function Tests: No results for input(s): TSH, T4TOTAL, T3FREE, THYROIDAB in the last 72 hours.  Invalid input(s): FREET3 Anemia Panel: No results for input(s): VITAMINB12, FOLATE, FERRITIN, TIBC, IRON, RETICCTPCT in the last 72 hours.  DG Shoulder Right  Result Date: 03/13/2021 CLINICAL DATA:  Pain. Right upper arm and shoulder pain. No known injury. EXAM: RIGHT SHOULDER - 2+ VIEW COMPARISON:  None. FINDINGS: There is no evidence of fracture or dislocation. Mild acromioclavicular spurring. Unremarkable glenohumeral joint. No erosion, focal bone lesion or bone destruction. Soft tissues are unremarkable. IMPRESSION: Mild acromioclavicular osteoarthritis. Electronically Signed   By: Keith Rake M.D.   On: 03/13/2021 15:29   DG Humerus Right  Result Date: 03/13/2021 CLINICAL DATA:  Pain. Right upper arm and shoulder pain. No known injury. EXAM: RIGHT HUMERUS - 2+ VIEW COMPARISON:  None. FINDINGS: Cortical  margins of the humerus are intact. There is no evidence of fracture or other focal bone lesions. Elbow alignment is maintained. Soft tissues are unremarkable. IMPRESSION: Negative radiographs of the right humerus. Electronically Signed   By: Keith Rake M.D.   On: 03/13/2021 15:29   ECHOCARDIOGRAM COMPLETE  Result Date: 03/14/2021    ECHOCARDIOGRAM REPORT   Patient Name:   Justin [name] Date of Exam: 03/14/2021 Medical Rec #:  [id number]      Height:       68.0 in Accession #:    [id number]     Weight:       172.6 lb Date of Birth:  09/08/1941       BSA:          1.920 m Patient Age:    80 years        BP:           145/69 mmHg Patient Gender: M              HR:           88 bpm. Exam Location:  [facility] Procedure: 2D Echo, Cardiac Doppler and Color Doppler Indications:     Atrial Fibrillation I48.91  History:         Patient has prior history of Echocardiogram examinations, most                  recent 11/05/2015. CAD and Previous Myocardial Infarction, Prior                  CABG, COPD; Risk Factors:Hypertension.  Sonographer:     Sherrie Sport RDCS (AE) Referring Phys:  [id number] Clabe Seal Diagnosing Phys: Isaias Cowman MD  Sonographer Comments: Suboptimal apical window and no subcostal window. IMPRESSIONS  1. 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 The left ventricle demonstrates regional wall motion abnormalities (see scoring diagram/findings for description). Left ventricular  diastolic parameters are indeterminate.  2. Right ventricular systolic function is normal. The right ventricular size is normal.  3. The mitral valve is normal in structure. Mild mitral valve regurgitation. No evidence of mitral stenosis.  4. The aortic valve is normal in structure. Aortic valve regurgitation is not visualized. No aortic stenosis is present.  5. The inferior vena cava is normal in size with greater than 50% respiratory variability, suggesting right atrial pressure of 3 mmHg. 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 The left ventricle demonstrates regional wall motion abnormalities. The left ventricular internal cavity size was normal in size. There is no left ventricular hypertrophy. Left ventricular diastolic parameters are indeterminate. Right Ventricle: The right ventricular size is normal. No increase in right ventricular wall thickness. Right ventricular systolic function is normal. Left Atrium: Left atrial size was normal in size. Right  Atrium: Right atrial size was normal in size. Pericardium: There is no evidence of pericardial effusion. Mitral Valve: The mitral valve is normal in structure. Mild mitral valve regurgitation. No evidence of mitral valve stenosis. Tricuspid Valve: The tricuspid valve is normal in structure. Tricuspid valve regurgitation is mild . No evidence of tricuspid stenosis. Aortic Valve: The aortic valve is normal in structure. Aortic valve regurgitation is not visualized. No aortic stenosis is present. Aortic valve mean gradient measures 2.0 mmHg. Aortic valve peak gradient measures 5.1 mmHg. Aortic valve area, by VTI measures 3.02 cm. Pulmonic  Valve: The pulmonic valve was normal in structure. Pulmonic valve regurgitation is not visualized. No evidence of pulmonic stenosis. Aorta: The aortic root is normal in size and structure. Venous: The inferior vena cava is normal in size with greater than 50% respiratory variability, suggesting right atrial pressure of 3 mmHg. IAS/Shunts: No atrial level shunt detected by color flow Doppler.  LEFT VENTRICLE PLAX 2D LVIDd:         4.31 cm LVIDs:         3.65 cm LV PW:         1.16 cm LV IVS:        0.96 cm LVOT diam:     2.00 cm LV SV:         47 LV SV Index:   24 LVOT Area:     3.14 cm  RIGHT VENTRICLE RV Basal diam:  4.60 cm RV S prime:     9.68 cm/s TAPSE (M-mode): 3.1 cm LEFT ATRIUM             Index       RIGHT ATRIUM           Index LA diam:        3.80 cm 1.98 cm/m  RA Area:     22.60 cm LA Vol (A2C):   49.1 ml 25.58 ml/m RA Volume:   69.20 ml  36.05 ml/m LA Vol (A4C):   62.5 ml 32.56 ml/m LA Biplane Vol: 55.9 ml 29.12 ml/m  AORTIC VALVE                   PULMONIC VALVE AV Area (Vmax):    2.27 cm    PV Vmax:        0.65 m/s AV Area (Vmean):   2.89 cm    PV Peak grad:   1.7 mmHg AV Area (VTI):     3.02 cm    RVOT Peak grad: 4 mmHg AV Vmax:           113.00 cm/s AV Vmean:          66.100 cm/s AV VTI:            0.155 m AV Peak Grad:      5.1 mmHg AV Mean Grad:      2.0  mmHg LVOT Vmax:         81.60 cm/s LVOT Vmean:        60.900 cm/s LVOT VTI:          0.149 m LVOT/AV VTI ratio: 0.96  AORTA Ao Root diam: 3.37 cm MITRAL VALVE               TRICUSPID VALVE MV Area (PHT): 7.37 cm    TR Peak grad:   26.6 mmHg MV Decel Time: 103 msec    TR Vmax:        258.00 cm/s MV E velocity: 89.10 cm/s                            SHUNTS                            Systemic VTI:  0.15 m                            Systemic Diam: 2.00 cm Isaias Cowman MD Electronically signed by Sheppard Coil  Paraschos MD Signature Date/Time: 03/14/2021/12:55:21 PM    Final      Echo LVEF 35-40%  TELEMETRY: sinus rhythm, 87 bpm  ASSESSMENT AND PLAN:  Active Problems:   Lobar pneumonia (HCC)   Left lower lobe pulmonary infiltrate   Septic shock (HCC)   Acute respiratory failure with hypoxia (HCC)   Atrial fibrillation with RVR (HCC)   Thrombocytopenia (HCC)   Left inguinal hernia        New onset atrial fibrillation/flutter, in the setting of acute hypoxic respiratory failure, pneumonia and septic shock. The patient's metoprolol was initially held due to hypotension, and has since been removed. He has a chads vasc score of 4. He has thrombocytopenia with platelets as low as 64. The patient is currently rate controlled, in sinus rhythm, and appears asymptomatic. Septic shock, due to pneumonia Pneumonia Hypertension, well controlled, restarted on metoprolol  Incarcerated inguinal hernia, which spontaneously reduced, thus no plan for emergent surgical intervention at this time Coronary artery disease status post CABG in 1992 with left heart catheterization in 2020 revealing patent left main, patent LIMA to LAD, chronically occluded SVG to diagonal, patent SVG to distal RCA, with patent stents in the mid vein graft with preserved LV function. Reduced LV function, LVEF 35-40%, which is lower compared to previous echo in 03/2020 which revealed normal left ventricular function with LVEF greater than  55%. Possibly secondary to atrial fibrillation.   Recommendations: Agree with resuming metoprolol tartrate for rate control; up-titrate as needed for rate control, and as blood pressure permits. Defer initiating chronic anticoagulation at this time in light of thrombocytopenia and atrial fibrillation possibly secondary to reversible causes. Resume PO torsemide 20 mg today and plan to discharge with the same with potassium supplementation. Advised patient to weigh himself daily and to call his cardiologist for an overnight weight gain of 2 or more pounds or worsening shortness of breath or peripheral edema. Defer ACEi or ARB at this time for reduced LV function; recommend repeating echocardiogram as outpatient when rate well controlled or in sinus rhythm.  Consider discharge today; defer further cardiac diagnostics at this time. Follow up with Dr. Ubaldo Glassing next week. Appointment request placed.       Clabe Seal, PA-C 03/15/2021 9:11 AM

## 2021-03-15 NOTE — TOC Transition Note (Signed)
Transition of Care [facility]) - CM/SW Discharge Note   Patient Details  Name: Justin [name] MRN: [id number] Date of Birth: 1941-04-20  Transition of Care [facility]) CM/SW Contact:  Shelbie Hutching, RN Phone Number: 03/15/2021, 10:34 AM   Clinical Narrative:    Patient is medically cleared for discharge home with home health services through [facility].  Malachy Mood with [facility] aware of discharge today.  Walker was delivered to the bedside yesterday.  Patient has Trilogy at home.  Wife and daughter are at the bedside and they will transport patient home.  PT will work with patient one more time before he goes home.     Final next level of care: [place] Barriers to Discharge: No Barriers Identified   Patient Goals and CMS Choice Patient states their goals for this hospitalization and ongoing recovery are:: to go home with home health CMS Medicare.gov Compare Post Acute Care list provided to:: Patient Choice offered to / list presented to : Patient  Discharge Placement                       Discharge Plan and Services   Discharge Planning Services: CM Consult Post Acute Care Choice: Home Health          DME Arranged: Walker rolling DME Agency: [employer] Date DME Agency Contacted: 03/14/21 Time DME Agency Contacted: 0931 Representative spoke with at DME Agency: [place]: RN, PT, OT [facility] Agency: [place] Date [place]: 03/15/21 Time [place]: 1034 Representative spoke with at [place]: [place] Determinants of Health ([place]) Interventions     Readmission Risk Interventions No flowsheet data found.

## 2021-03-19 ENCOUNTER — Ambulatory Visit (INDEPENDENT_AMBULATORY_CARE_PROVIDER_SITE_OTHER): Payer: Medicare Other | Admitting: General Surgery

## 2021-03-19 ENCOUNTER — Telehealth: Payer: Self-pay

## 2021-03-19 ENCOUNTER — Encounter: Payer: Self-pay | Admitting: General Surgery

## 2021-03-19 ENCOUNTER — Other Ambulatory Visit: Payer: Self-pay

## 2021-03-19 VITALS — BP 146/87 | HR 84 | Temp 98.2°F | Ht 68.0 in | Wt 166.4 lb

## 2021-03-19 DIAGNOSIS — K4090 Unilateral inguinal hernia, without obstruction or gangrene, not specified as recurrent: Secondary | ICD-10-CM

## 2021-03-19 NOTE — Progress Notes (Signed)
Patient ID: Justin [name], male   DOB: 05-02-1941, 80 y.o.   MRN: [id number]  Chief Complaint  Patient presents with   New Patient (Initial Visit)    Left inguinal hernia    HPI Justin [name] is a 80 y.o. male.   He was referred by Dr. Hollice Espy, urology, for evaluation of a left inguinal hernia.  She was seeing him for bladder pain and I have copied the initial portion of her consultation note here:  "HPI: 80 year old male with a personal history of prostate cancer who presents today for further evaluation of "bladder pain".  He was previously patient of Dr. Jacqlyn Larsen at [facility] urology from which records are available via [place] today.   Notably, he has a personal history of prostate cancer diagnosed in 2008.  He underwent retropubic prostatectomy.  PSA has remained undetectable as of 07/2020.   He reports pain with bladder filling which is relieved with urination.  This is been going on for several months.  In addition to this, he has had a lot of GI upset which is just below the umbilicus which he has been treated with Carafate.  He believes the 2 issues may be related."  When he returned to her office for cystoscopy on June 14, she identified a reducible left inguinal hernia.  She placed a referral to surgery, but before he could be seen in our office, he was admitted to the hospital with hypotension and dehydration secondary to nausea and vomiting.  Apparently, every 2 weeks since April of this year, he has had episodes of what he describes as constipation, resulting in nausea and vomiting.  This resolves once he has a bowel movement.  He is on a number of stool softeners and other agents to try and maintain regular bowel function.  When he came into the hospital, in addition to pneumonia, he was noted to have a left inguinal hernia that was initially thought to be incarcerated. General surgery was consulted, but at the time of evaluation, the hernia was reduced and it was felt that he  was not in ideal condition to undergo an essentially elective procedure.  He was adequately resuscitated and discharged from the hospital on June 24.  He states that he is weak, but feeling better.  He would like to have his hernia repaired, but feels that he needs to recuperate further before proceeding.  He states that he is able to reduce the hernia with pressure and it often spontaneously reduces when he lies flat.  He is normally a fairly active individual and wants to make sure that he can continue to work on his physical conditioning without fear of hernia complications while he awaits surgery.  He does have a history of prior right inguinal hernia repair in the 1980s.  He also has a history of exploratory laparotomy secondary to trauma in 1963 including repair of a liver laceration.   Past Medical History:  Diagnosis Date   Adenomatous polyps    Anginal pain ([place])    Arthritis    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   BPH (benign prostatic hyperplasia)    CAD (coronary artery disease)    Cataract    Chronic kidney disease    COPD (chronic obstructive pulmonary disease) (HCC)    DJD (degenerative joint disease)    Does use hearing aid    bilateral   Dyspnea    Dysrhythmia    GERD (gastroesophageal reflux disease)  History of kidney stones    History of open heart surgery    Hypertension    Myocardial infarction [facility])    Osteoporosis    Prostate cancer ([place])    Skin cancer    Sleep apnea    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   Wears dentures    partial upper and lower    Past Surgical History:  Procedure Laterality Date   BACK SURGERY     CARDIAC CATHETERIZATION N/A 11/07/2015   Procedure: Left Heart Cath and Coronary Angiography;  Surgeon: Teodoro Spray, MD;  Location: [place] [facility];  Service: Cardiovascular;  Laterality: N/A;   CARDIAC CATHETERIZATION N/A  11/07/2015   Procedure: Coronary Stent Intervention;  Surgeon: Isaias Cowman, MD;  Location: [place] [facility];  Service: Cardiovascular;  Laterality: N/A;   CATARACT EXTRACTION     CHOLECYSTECTOMY     COLONOSCOPY N/A 06/13/2020   Procedure: COLONOSCOPY;  Surgeon: Toledo, Benay Pike, MD;  Location: [facility] ENDOSCOPY;  Service: Gastroenterology;  Laterality: N/A;   COLONOSCOPY WITH PROPOFOL N/A 03/27/2017   Procedure: COLONOSCOPY WITH PROPOFOL;  Surgeon: Manya Silvas, MD;  Location: [facility] ENDOSCOPY;  Service: Endoscopy;  Laterality: N/A;   CORONARY ANGIOPLASTY     CORONARY ARTERY BYPASS GRAFT     ESOPHAGOGASTRODUODENOSCOPY N/A 01/14/2021   Procedure: ESOPHAGOGASTRODUODENOSCOPY (EGD);  Surgeon: Toledo, Benay Pike, MD;  Location: [facility] ENDOSCOPY;  Service: Gastroenterology;  Laterality: N/A;   ESOPHAGOGASTRODUODENOSCOPY (EGD) WITH PROPOFOL N/A 03/27/2017   Procedure: ESOPHAGOGASTRODUODENOSCOPY (EGD) WITH PROPOFOL;  Surgeon: Manya Silvas, MD;  Location: [facility] ENDOSCOPY;  Service: Endoscopy;  Laterality: N/A;   EXCISION PARTIAL PHALANX Right 09/10/2017   Procedure: EXCISION PARTIAL PHALANX-2nd toe;  Surgeon: Albertine Patricia, DPM;  Location: [place];  Service: Podiatry;  Laterality: Right;  sleep apnea   EYE SURGERY     HERNIA REMOVED     HERNIA REPAIR     kissing spine syndrome     KYPHOPLASTY N/A 03/23/2020   Procedure: KYPHOPLASTY L1;  Surgeon: Hessie Knows, MD;  Location: [facility] ORS;  Service: Orthopedics;  Laterality: N/A;   KYPHOPLASTY N/A 05/15/2020   Procedure: T 12 Kyphoplasty;  Surgeon: Hessie Knows, MD;  Location: [facility] ORS;  Service: Orthopedics;  Laterality: N/A;   kyphoplasty t10 t11     LEFT HEART CATH AND CORONARY ANGIOGRAPHY Left 09/28/2018   Procedure: LEFT HEART CATH AND CORONARY ANGIOGRAPHY;  Surgeon: Teodoro Spray, MD;  Location: [place] [facility];  Service: Cardiovascular;  Laterality: Left;   OPEN HEART SURGERY     RETROPUBIC PROSTATECTOMY     SKIN CANCER  REMOVED      Family History  Problem Relation Age of Onset   Cancer Father    Prostate cancer Father    Bladder Cancer Neg Hx    Kidney cancer Neg Hx     Social History Social History   Tobacco Use   Smoking status: Former    Years: 35.00    Pack years: 0.00    Types: Cigarettes    Quit date: 06/26/1990    Years since quitting: 30.7   Smokeless tobacco: Never  Vaping Use   Vaping Use: Never used  Substance Use Topics   Alcohol use: No    Alcohol/week: 0.0 standard drinks   Drug use: No    Allergies  Allergen Reactions   Hydrochlorothiazide W-Triamterene Nausea Only  Current Outpatient Medications  Medication Sig Dispense Refill   acetaminophen (TYLENOL) 500 MG tablet Take 1,000 mg by mouth every 8 (eight) hours as needed.     albuterol (PROVENTIL HFA;VENTOLIN HFA) 108 (90 Base) MCG/ACT inhaler Inhale 2 puffs into the lungs every 4 (four) hours as needed for wheezing or shortness of breath.     aspirin EC 81 MG tablet Take 81 mg by mouth daily.     atorvastatin (LIPITOR) 80 MG tablet Take 40 mg by mouth at bedtime.      Calcium Carbonate-Vitamin D 600-400 MG-UNIT tablet Take 1 tablet by mouth 2 (two) times daily.     cholecalciferol (VITAMIN D) 1000 units tablet Take 3,000 Units by mouth daily.      cyanocobalamin 1000 MCG tablet Take 1,000 mcg by mouth at bedtime.      fexofenadine (ALLEGRA) 180 MG tablet Take 180 mg by mouth daily before breakfast.      fluticasone (FLONASE) 50 MCG/ACT nasal spray Place 2 sprays into both nostrils at bedtime.     gabapentin (NEURONTIN) 300 MG capsule Take 300 mg by mouth at bedtime.      hydrocortisone (ANUSOL-HC) 25 MG suppository Place 25 mg rectally 2 (two) times daily as needed for hemorrhoids or anal itching.      isosorbide mononitrate (IMDUR) 60 MG 24 hr tablet Take 60 mg by mouth daily.     Magnesium 400 MG CAPS Take 400 mg by mouth 2 (two) times daily.      metoprolol tartrate (LOPRESSOR) 25 MG tablet Take 25 mg by mouth 2  (two) times daily.      Multiple Vitamins-Minerals (EMERGEN-C VITAMIN C) PACK Take 1 packet by mouth daily.     niacin 500 MG tablet Take 500 mg by mouth 3 (three) times daily.      nitroGLYCERIN (NITROSTAT) 0.4 MG SL tablet Place 0.4 mg under the tongue every 5 (five) minutes x 3 doses as needed for chest pain. If no relief call MD or go to emergency room.     Omega-3 Fatty Acids (FISH OIL) 1000 MG CAPS Take 1,000 mg by mouth 2 (two) times daily.     omeprazole (PRILOSEC) 40 MG capsule Take 1 capsule by mouth 2 (two) times daily.     ondansetron (ZOFRAN) 4 MG tablet Take 1 tablet (4 mg total) by mouth every 8 (eight) hours as needed for nausea or vomiting. 20 tablet 0   polyethylene glycol (MIRALAX / GLYCOLAX) 17 g packet Take 17 g by mouth daily as needed for moderate constipation. 30 each 0   potassium chloride SA (KLOR-CON) 20 MEQ tablet Take 1 tablet (20 mEq total) by mouth daily.     Probiotic CAPS Take 2 capsules by mouth at bedtime.     senna-docusate (SENOKOT-S) 8.6-50 MG tablet Take 2 tablets by mouth 2 (two) times daily.     sucralfate (CARAFATE) 1 g tablet Take 1 g by mouth 4 (four) times daily.     TART CHERRY PO Take 1 capsule by mouth daily.     Tiotropium Bromide-Olodaterol 2.5-2.5 MCG/ACT AERS Inhale 2 puffs into the lungs daily.     torsemide (DEMADEX) 20 MG tablet Take 60 mg by mouth daily.      Wheat Dextrin (BENEFIBER DRINK MIX PO) Take 1 Dose by mouth daily.      traMADol (ULTRAM) 50 MG tablet Take 50 mg by mouth in the morning.     No current facility-administered medications for this visit.  Facility-Administered Medications Ordered in Other Visits  Medication Dose Route Frequency Provider Last Rate Last Admin   furosemide (LASIX) injection 80 mg  80 mg Intravenous Once Teodoro Spray, MD       potassium chloride (KLOR-CON) CR tablet 40 mEq  40 mEq Oral Daily Teodoro Spray, MD        Review of Systems Review of Systems  Constitutional:  Positive for unexpected  weight change.       Decrease of 30 pounds since February  HENT:  Positive for hearing loss.   Eyes:  Positive for visual disturbance.       Blurred vision, wears glasses  Respiratory:  Positive for wheezing.   Cardiovascular:  Positive for leg swelling.  Gastrointestinal:  Positive for abdominal pain, constipation, diarrhea, nausea and vomiting.       Heartburn  Genitourinary:  Positive for frequency and urgency.  Hematological:  Bruises/bleeds easily.   Today's Vitals   03/19/21 1614  BP: (!) 146/87  Pulse: 84  Temp: 98.2 F (36.8 C)  TempSrc: Oral  SpO2: 93%  Weight: 166 lb 6.4 oz (75.5 kg)  Height: 5\' 8"  (1.727 m)   Body mass index is 25.3 kg/m.  Physical Exam Physical Exam Constitutional:      General: He is not in acute distress.    Appearance: Normal appearance. He is normal weight.  HENT:     Head: Normocephalic and atraumatic.     Ears:     Comments: Bilateral hearing aids    Nose:     Comments: Covered with a mask    Mouth/Throat:     Comments: Covered with a mask Eyes:     General: No scleral icterus.       Right eye: No discharge.        Left eye: No discharge.  Neck:     Comments: No palpable cervical or supraclavicular lymphadenopathy.  The trachea is midline.  No thyromegaly or dominant thyroid masses appreciated.  The gland moves freely with deglutition. Cardiovascular:     Rate and Rhythm: Normal rate and regular rhythm.  Pulmonary:     Effort: Pulmonary effort is normal.     Comments: Breath sounds diminished in all fields Abdominal:     General: Bowel sounds are normal.     Palpations: Abdomen is soft.     Comments: Scars consistent with prior surgical history  Genitourinary:    Comments: Reducible left inguinal hernia Musculoskeletal:     Right lower leg: Edema present.     Left lower leg: Edema present.  Skin:    General: Skin is warm and dry.  Neurological:     General: No focal deficit present.     Mental Status: He is alert.   Psychiatric:        Mood and Affect: Mood normal.        Behavior: Behavior normal.    Data Reviewed I reviewed the CT scan of the abdomen and pelvis performed on March 10, 2021 and concur with the radiology interpretation copied here:  CLINICAL DATA:  Patient with nausea and vomiting for the prior 2 days. History of hernia.   EXAM: CT ABDOMEN AND PELVIS WITH CONTRAST   TECHNIQUE: Multidetector CT imaging of the abdomen and pelvis was performed using the standard protocol following bolus administration of intravenous contrast.   CONTRAST:  58mL OMNIPAQUE IOHEXOL 300 MG/ML  SOLN   COMPARISON:  CT abdomen pelvis January 10, 2021.   FINDINGS: Lower chest:  Normal heart size. Patchy consolidative opacities within the left lower lobe and lingula. Minimal ground-glass opacities within the right lower lobe. No pleural effusion   Hepatobiliary: Liver is normal in size and contour. Prior cholecystectomy. No intrahepatic or extrahepatic biliary ductal dilatation.   Pancreas: Fatty atrophy of the pancreas.   Spleen: Unremarkable.   Adrenals/Urinary Tract: Normal adrenal glands. Kidneys enhance symmetrically with contrast. 5 mm stone inferior pole left kidney. 2 cm exophytic cyst mid pole right kidney. Urinary bladder is unremarkable.   Stomach/Bowel: Sigmoid colonic diverticulosis. No CT evidence for acute diverticulitis. Normal appendix. Moderate-sized hiatal hernia.   Oral contrast material is demonstrated to the mid small bowel located within the central lower abdomen. There are dilated loops of small bowel measuring up to 4 cm within the lower abdomen (image 73; series 2). Within the left lower quadrant there is a loop of small bowel with wall thickening and associated mesenteric fat stranding/edema. This loop of bowel courses through the left inguinal hernia where there is decompressed small bowel distally. No free intraperitoneal air.   Vascular/Lymphatic: Normal caliber  abdominal aorta. Peripheral calcified atherosclerotic plaque. No retroperitoneal lymphadenopathy.   Reproductive: Status post prostatectomy.   Other: Small bowel containing and fat containing left inguinal hernia. Fat containing right inguinal hernia.   Musculoskeletal: No aggressive or acute appearing osseous lesions. Lower thoracic and lumbar spine degenerative changes. Multilevel lower thoracic and upper lumbar spine kyphoplasty.   IMPRESSION: 1. There is a small bowel containing left inguinal hernia. The small bowel proximal to the hernia is dilated measuring up to 4 cm with associated wall thickening and mesenteric fluid/fat stranding compatible with obstruction and associated inflammation. 2. Patchy consolidative opacities within the left lower lobe most compatible with pneumonia. Recommend follow-up chest radiograph in 6-8 weeks to ensure resolution. 3. These results were called by telephone at the time of interpretation on 03/10/2021 at 10:48 am to provider [facility] , who verbally acknowledged these results.   Results for BRUIN, BOLGER (MRN [id number]) as of 03/21/2021 09:18  Ref. Range 03/15/2021 05:58  Sodium Latest Ref Range: 135 - 145 mmol/L 140  Potassium Latest Ref Range: 3.5 - 5.1 mmol/L 3.9  Chloride Latest Ref Range: 98 - 111 mmol/L 108  CO2 Latest Ref Range: 22 - 32 mmol/L 26  Glucose Latest Ref Range: 70 - 99 mg/dL 136 (H)  BUN Latest Ref Range: 8 - 23 mg/dL 18  Creatinine Latest Ref Range: 0.61 - 1.24 mg/dL 0.94  Calcium Latest Ref Range: 8.9 - 10.3 mg/dL 8.4 (L)  Anion gap Latest Ref Range: 5 - 15  6  GFR, Estimated Latest Ref Range: >60 mL/min >60  WBC Latest Ref Range: 4.0 - 10.5 K/uL 4.2  RBC Latest Ref Range: 4.22 - 5.81 MIL/uL 3.35 (L)  Hemoglobin Latest Ref Range: 13.0 - 17.0 g/dL 10.5 (L)  HCT Latest Ref Range: 39.0 - 52.0 % 31.9 (L)  MCV Latest Ref Range: 80.0 - 100.0 fL 95.2  MCH Latest Ref Range: 26.0 - 34.0 pg 31.3  MCHC Latest Ref Range:  30.0 - 36.0 g/dL 32.9  RDW Latest Ref Range: 11.5 - 15.5 % 14.2  Platelets Latest Ref Range: 150 - 400 K/uL 74 (L)  nRBC Latest Ref Range: 0.0 - 0.2 % 0.0  These are the most recent labs available in Epic.  No concerning findings.  I reviewed his recent hospital course via the electronic medical record.  Assessment This is a 80 year old man with a left inguinal hernia.  Although it is reducible, he has symptoms suggestive of intermittent obstructive episodes.  He is still somewhat debilitated from his recent hospitalization.  I have recommended that he undergo surgical repair after he has had a chance to recuperate further.  Plan I have offered him a left inguinal hernia repair.  Given his history of prostatectomy and prior exploratory laparotomy, I think this is likely to be best approached in an open manner.  He will require cardiology and pulmonology clearance prior to his operation.  I would like to see him back in about a month to follow-up to make sure that he is still physically suitable for operative intervention. I have explained the procedure, risks, and aftercare of inguinal hernia repair to [name].   Risks include but are not limited to bleeding, infection, wound problems, anesthesia, recurrence, bladder or intestine injury, urinary retention, testicular dysfunction, chronic pain, mesh problems.  He  seems to understand and agrees to proceed.  Questions were answered to his stated satisfaction. Clearance requests faxed by office staff.    Fredirick Maudlin 03/19/2021, 1:22 PM

## 2021-03-19 NOTE — Telephone Encounter (Signed)
Cardiac Clearance faxed to Dr.Fath at this time Pulmonary Clearance faxed to Dr.Fleming at this time- patient has appointment scheduled with Dr.Fath 67/28/22. Patient to call Dr.Flemings office to schedule appointment due to recent hospital stay.

## 2021-03-19 NOTE — Patient Instructions (Addendum)
Please see your follow up appointment listed below.    We will send Pulmonary Clearance and Cardiac Clearance to your providers.  Please call their office to see if they need to see your prior to completing the Clearance forms.   Inguinal Hernia, Adult An inguinal hernia is when fat or your intestines push through a weak spot in a muscle where your leg meets your lower belly (groin). This causes a bulge. This kind of hernia could also be: In your scrotum, if you are male. In folds of skin around your vagina, if you are male. There are three types of inguinal hernias: Hernias that can be pushed back into the belly (are reducible). This type rarely causes pain. Hernias that cannot be pushed back into the belly (are incarcerated). Hernias that cannot be pushed back into the belly and lose their blood supply (are strangulated). This type needs emergency surgery. What are the causes? This condition is caused by having a weak spot in the muscles or tissues in your groin. This develops over time. The hernia may poke through the weak spot when you strain your lower belly muscles all of a sudden, such as when you: Lift a heavy object. Strain to poop (have a bowel movement). Trouble pooping (constipation) can lead to straining. Cough. What increases the risk? This condition is more likely to develop in: Males. Pregnant females. People who: Are overweight. Work in jobs that require long periods of standing or heavy lifting. Have had an inguinal hernia before. Smoke or have lung disease. These factors can lead to long-term (chronic) coughing. What are the signs or symptoms? Symptoms may depend on the size of the hernia. Often, a small hernia has no symptoms. Symptoms of a larger hernia may include: A bulge in the groin area. This is easier to see when standing. You might not be able to see it when you are lying down. Pain or burning in the groin. This may get worse when you lift, strain, or  cough. A dull ache or a feeling of pressure in the groin. An abnormal bulge in the scrotum, in males. Symptoms of a strangulated inguinal hernia may include: A bulge in your groin that is very painful and tender to the touch. A bulge that turns red or purple. Fever, feeling like you may vomit (nausea), and vomiting. Not being able to poop or to pass gas. How is this treated? Treatment depends on the size of your hernia and whether you have symptoms. If you do not have symptoms, your doctor may have you watch your hernia carefully and have you come in for follow-up visits. If your hernia is large or if youhave symptoms, you may need surgery to repair the hernia. Follow these instructions at home: Lifestyle Avoid lifting heavy objects. Avoid standing for long amounts of time. Do not smoke or use any products that contain nicotine or tobacco. If you need help quitting, ask your doctor. Stay at a healthy weight. Prevent trouble pooping You may need to take these actions to prevent or treat trouble pooping: Drink enough fluid to keep your pee (urine) pale yellow. Take over-the-counter or prescription medicines. Eat foods that are high in fiber. These include beans, whole grains, and fresh fruits and vegetables. Limit foods that are high in fat and sugar. These include fried or sweet foods. General instructions You may try to push your hernia back in place by very gently pressing on it when you are lying down. Do not try to push  the bulge back in if it will not go in easily. Watch your hernia for any changes in shape, size, or color. Tell your doctor if you see any changes. Take over-the-counter and prescription medicines only as told by your doctor. Keep all follow-up visits. Contact a doctor if: You have a fever or chills. You have new symptoms. Your symptoms get worse. Get help right away if: You have pain in your groin that gets worse all of a sudden. You have a bulge in your groin  that: Gets bigger all of a sudden, and it does not get smaller after that. Turns red or purple. Is painful when you touch it. You are a male, and you have: Sudden pain in your scrotum. A sudden change in the size of your scrotum. You cannot push the hernia back in place by very gently pressing on it when you are lying down. You feel like you may vomit, and that feeling does not go away. You keep vomiting. You have a fast heartbeat. You cannot poop or pass gas. These symptoms may be an emergency. Get help right away. Call your local emergency services (911 in the [place] Do not wait to see if the symptoms will go away. Do not drive yourself to the hospital. Summary An inguinal hernia is when fat or your intestines push through a weak spot in a muscle where your leg meets your lower belly (groin). This causes a bulge. If you do not have symptoms, you may not need treatment. If you have symptoms or a large hernia, you may need surgery. Avoid lifting heavy objects. Also, avoid standing for long amounts of time. Do not try to push the bulge back in if it will not go in easily. This information is not intended to replace advice given to you by your health care provider. Make sure you discuss any questions you have with your healthcare provider. Document Revised: 05/08/2020 Document Reviewed: 05/08/2020 Elsevier Patient Education  2022 [employer].

## 2021-03-20 NOTE — Progress Notes (Unsigned)
Cardiac Clearance has been received from Dr Fath's office. The patient is cleared at Low risk for surgery.  

## 2021-03-21 ENCOUNTER — Encounter: Payer: Self-pay | Admitting: General Surgery

## 2021-03-27 ENCOUNTER — Encounter: Payer: Self-pay | Admitting: General Surgery

## 2021-03-28 ENCOUNTER — Encounter: Payer: Self-pay | Admitting: Emergency Medicine

## 2021-03-28 DIAGNOSIS — I129 Hypertensive chronic kidney disease with stage 1 through stage 4 chronic kidney disease, or unspecified chronic kidney disease: Secondary | ICD-10-CM | POA: Diagnosis not present

## 2021-03-28 DIAGNOSIS — Z7982 Long term (current) use of aspirin: Secondary | ICD-10-CM | POA: Diagnosis not present

## 2021-03-28 DIAGNOSIS — E871 Hypo-osmolality and hyponatremia: Secondary | ICD-10-CM | POA: Insufficient documentation

## 2021-03-28 DIAGNOSIS — K529 Noninfective gastroenteritis and colitis, unspecified: Secondary | ICD-10-CM | POA: Diagnosis not present

## 2021-03-28 DIAGNOSIS — Z79899 Other long term (current) drug therapy: Secondary | ICD-10-CM | POA: Diagnosis not present

## 2021-03-28 DIAGNOSIS — Z87891 Personal history of nicotine dependence: Secondary | ICD-10-CM | POA: Diagnosis not present

## 2021-03-28 DIAGNOSIS — J449 Chronic obstructive pulmonary disease, unspecified: Secondary | ICD-10-CM | POA: Diagnosis not present

## 2021-03-28 DIAGNOSIS — Z85828 Personal history of other malignant neoplasm of skin: Secondary | ICD-10-CM | POA: Diagnosis not present

## 2021-03-28 DIAGNOSIS — N189 Chronic kidney disease, unspecified: Secondary | ICD-10-CM | POA: Insufficient documentation

## 2021-03-28 DIAGNOSIS — K4090 Unilateral inguinal hernia, without obstruction or gangrene, not specified as recurrent: Secondary | ICD-10-CM | POA: Diagnosis not present

## 2021-03-28 DIAGNOSIS — I2510 Atherosclerotic heart disease of native coronary artery without angina pectoris: Secondary | ICD-10-CM | POA: Insufficient documentation

## 2021-03-28 DIAGNOSIS — Z8546 Personal history of malignant neoplasm of prostate: Secondary | ICD-10-CM | POA: Diagnosis not present

## 2021-03-28 DIAGNOSIS — Z7951 Long term (current) use of inhaled steroids: Secondary | ICD-10-CM | POA: Insufficient documentation

## 2021-03-28 DIAGNOSIS — R1032 Left lower quadrant pain: Secondary | ICD-10-CM | POA: Diagnosis present

## 2021-03-28 LAB — CBC
HCT: 36.8 % — ABNORMAL LOW (ref 39.0–52.0)
Hemoglobin: 12.5 g/dL — ABNORMAL LOW (ref 13.0–17.0)
MCH: 32.7 pg (ref 26.0–34.0)
MCHC: 34 g/dL (ref 30.0–36.0)
MCV: 96.3 fL (ref 80.0–100.0)
Platelets: 253 10*3/uL (ref 150–400)
RBC: 3.82 MIL/uL — ABNORMAL LOW (ref 4.22–5.81)
RDW: 14.4 % (ref 11.5–15.5)
WBC: 10.5 10*3/uL (ref 4.0–10.5)
nRBC: 0 % (ref 0.0–0.2)

## 2021-03-28 LAB — COMPREHENSIVE METABOLIC PANEL
ALT: 41 U/L (ref 0–44)
AST: 33 U/L (ref 15–41)
Albumin: 3.4 g/dL — ABNORMAL LOW (ref 3.5–5.0)
Alkaline Phosphatase: 104 U/L (ref 38–126)
Anion gap: 8 (ref 5–15)
BUN: 14 mg/dL (ref 8–23)
CO2: 25 mmol/L (ref 22–32)
Calcium: 8.8 mg/dL — ABNORMAL LOW (ref 8.9–10.3)
Chloride: 92 mmol/L — ABNORMAL LOW (ref 98–111)
Creatinine, Ser: 1.05 mg/dL (ref 0.61–1.24)
GFR, Estimated: >60 mL/min (ref >60)
Glucose, Bld: 122 mg/dL — ABNORMAL HIGH (ref 70–99)
Potassium: 3.8 mmol/L (ref 3.5–5.1)
Sodium: 125 mmol/L — ABNORMAL LOW (ref 135–145)
Total Bilirubin: 0.8 mg/dL (ref 0.3–1.2)
Total Protein: 7.2 g/dL (ref 6.5–8.1)

## 2021-03-28 LAB — LIPASE, BLOOD: Lipase: 27 U/L (ref 11–51)

## 2021-03-28 NOTE — ED Triage Notes (Signed)
EMS brings pt in from home for c/o inguinal hernia

## 2021-03-28 NOTE — ED Triage Notes (Signed)
Pt arrived via [facility] from home where he called out due to inguinal hernia pain. Per pt, the hernia has been out since Fathers day. Pt denies N/V/D. Pt also reports normal urination and BM.

## 2021-03-29 ENCOUNTER — Emergency Department
Admission: EM | Admit: 2021-03-29 | Discharge: 2021-03-29 | Disposition: A | Discharge status: Home / Self Care | Payer: Medicare Other | Attending: Emergency Medicine | Admitting: Emergency Medicine

## 2021-03-29 ENCOUNTER — Emergency Department: Payer: Medicare Other

## 2021-03-29 DIAGNOSIS — K4090 Unilateral inguinal hernia, without obstruction or gangrene, not specified as recurrent: Secondary | ICD-10-CM

## 2021-03-29 DIAGNOSIS — K529 Noninfective gastroenteritis and colitis, unspecified: Secondary | ICD-10-CM

## 2021-03-29 DIAGNOSIS — E871 Hypo-osmolality and hyponatremia: Secondary | ICD-10-CM

## 2021-03-29 LAB — URINALYSIS, COMPLETE (UACMP) WITH MICROSCOPIC
Bacteria, UA: NONE SEEN
Bilirubin Urine: NEGATIVE
Glucose, UA: NEGATIVE mg/dL
Hgb urine dipstick: NEGATIVE
Ketones, ur: NEGATIVE mg/dL
Leukocytes,Ua: NEGATIVE
Nitrite: NEGATIVE
Protein, ur: NEGATIVE mg/dL
Specific Gravity, Urine: 1.017 (ref 1.005–1.030)
pH: 7 (ref 5.0–8.0)

## 2021-03-29 MED ORDER — DICYCLOMINE HCL 20 MG PO TABS: 20.0000 mg | ORAL_TABLET | Freq: Three times a day (TID) | ORAL | 0 refills | Status: DC | PRN

## 2021-03-29 MED ORDER — ONDANSETRON 4 MG PO TBDP: 4.0000 mg | ORAL_TABLET | Freq: Four times a day (QID) | ORAL | 0 refills | Status: DC | PRN

## 2021-03-29 MED ORDER — IOHEXOL 350 MG/ML SOLN
100.0000 mL | Freq: Once | INTRAVENOUS | Status: AC | PRN
  Administered 2021-03-29: 100 mL via INTRAVENOUS

## 2021-03-29 MED ORDER — FLEET ENEMA 7-19 GM/118ML RE ENEM
1.0000 | ENEMA | Freq: Once | RECTAL | Status: AC
  Administered 2021-03-29: 1 via RECTAL

## 2021-03-29 MED ORDER — ONDANSETRON HCL 4 MG/2ML IJ SOLN
4.0000 mg | Freq: Once | INTRAMUSCULAR | Status: AC
  Administered 2021-03-29: 4 mg via INTRAVENOUS
  Filled 2021-03-29: qty 2

## 2021-03-29 MED ORDER — SODIUM CHLORIDE 0.9 % IV BOLUS (SEPSIS)
500.0000 mL | Freq: Once | INTRAVENOUS | Status: AC
  Administered 2021-03-29: 500 mL via INTRAVENOUS

## 2021-03-29 MED ORDER — FENTANYL CITRATE (PF) 100 MCG/2ML IJ SOLN
50.0000 ug | Freq: Once | INTRAMUSCULAR | Status: AC
Start: 2021-03-29 — End: 2021-03-29
  Administered 2021-03-29: 50 ug via INTRAVENOUS
  Filled 2021-03-29: qty 2

## 2021-03-29 NOTE — Discharge Instructions (Addendum)
You may use over-the-counter MiraLAX 1-2 times a day as well as Colace 100 mg twice a day to help with constipation.  You may also use over-the-counter Fleet enemas.  Your CT scan was suggestive of enteritis which is usually a viral illness that can cause vomiting and diarrhea.  You may notice over the next several days that you begin having diarrhea.  You may take over-the-counter Imodium as needed.  Please follow-up with Dr. Celine Ahr regarding your inguinal hernia which was reduced today.   Your sodium level was also slightly low at 125.  I recommend that you have this rechecked by your primary care physician next week.

## 2021-03-29 NOTE — ED Notes (Signed)
No BM following administration of enema. Pt placed in brief by this RN and Kat NT. Pt given water for fluid challenge. Denies further needs at this time.

## 2021-03-29 NOTE — ED Provider Notes (Signed)
[facility] Emergency Department Provider Note  ____________________________________________   Event Date/Time   First MD Initiated Contact with Patient 03/29/21 0041     (approximate)  I have reviewed the triage vital signs and the nursing notes.   HISTORY  Chief Complaint Bloated    HPI Justin [name] is a 80 y.o. male With history of CAD, COPD, atrial fibrillation who presents to the emergency department with complaints of left lower quadrant abdominal pain today.  Has history of a left inguinal hernia and states he was not able to reduce this area today it was more tender than usual.  He has had nausea without vomiting.  Did take nausea medication prior to arrival.  Also reports he has not had a bowel movement or passed gas today.  No known fever.  Recently admitted to the hospital for small bowel obstruction, septic shock, incarcerated left inguinal hernia, community-acquired pneumonia of the left lower lung.  States he is still on antibiotics and has 2 days left.  Was just discharged on 03/15/2021.  Last seen by Dr. Celine Ahr with general surgery on 03/19/2021.  It appears they are waiting for cardiology and pulmonary clearance prior to elective surgical repair.  He does have a history of prior right inguinal hernia repair in the 1980s.  He also has a history of exploratory laparotomy secondary to trauma in 1963 including repair of a liver laceration.  Also has had a prostatectomy.        Past Medical History:  Diagnosis Date   Adenomatous polyps    Anginal pain ([place])    Arthritis    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   BPH (benign prostatic hyperplasia)    CAD (coronary artery disease)    Cataract    Chronic kidney disease    COPD (chronic obstructive pulmonary disease) (HCC)    DJD (degenerative joint disease)    Does use hearing aid    bilateral   Dyspnea    Dysrhythmia    GERD (gastroesophageal reflux disease)    History of kidney stones     History of open heart surgery    Hypertension    Myocardial infarction [facility])    Osteoporosis    Prostate cancer ([place])    Skin cancer    Sleep apnea    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   Wears dentures    partial upper and lower    Patient Active Problem List   Diagnosis Date Noted   Acute pain of right shoulder    Septic shock (HCC)    Acute respiratory failure with hypoxia (HCC)    Atrial fibrillation with RVR (HCC)    Thrombocytopenia (HCC)    Left inguinal hernia    Left lower lobe pulmonary infiltrate 03/10/2021   PAD (peripheral artery disease) ([place]) 11/10/2018   Leg pain 11/10/2018   History of compression fracture of spine 07/30/2017   Lobar pneumonia ([place]) 11/03/2015   NSTEMI (non-ST elevated myocardial infarction) ([place]) 11/03/2015   DDD (degenerative disc disease), lumbar 10/12/2014   Incomplete bladder emptying 02/15/2014   Obstruction of urinary tract 02/15/2014   OP (osteoporosis) 01/21/2014   Esophagitis, reflux 01/21/2014   Osteoporosis 01/21/2014   Fatigue 09/30/2013   Malaise and fatigue 09/30/2013   H/O malignant neoplasm of prostate 12/15/2012   Calculus of kidney 12/09/2012   ED (erectile dysfunction) of organic  origin 12/09/2012   CA of prostate ([place]) 12/09/2012   Genuine stress incontinence, male 12/09/2012   Urge incontinence 12/09/2012   Arteriosclerosis of coronary artery 06/17/2011   Atherosclerosis of coronary artery 06/17/2011   HLD (hyperlipidemia) 06/17/2011   Obstructive apnea 06/17/2011   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    Past Surgical History:  Procedure Laterality Date   BACK SURGERY     CARDIAC CATHETERIZATION N/A 11/07/2015   Procedure: Left Heart Cath and Coronary Angiography;  Surgeon: Teodoro Spray, MD;  Location: [place] [facility];  Service: Cardiovascular;  Laterality: N/A;   CARDIAC CATHETERIZATION N/A  11/07/2015   Procedure: Coronary Stent Intervention;  Surgeon: Isaias Cowman, MD;  Location: [place] [facility];  Service: Cardiovascular;  Laterality: N/A;   CATARACT EXTRACTION     CHOLECYSTECTOMY     COLONOSCOPY N/A 06/13/2020   Procedure: COLONOSCOPY;  Surgeon: [place], Benay Pike, MD;  Location: [facility] ENDOSCOPY;  Service: Gastroenterology;  Laterality: N/A;   COLONOSCOPY WITH PROPOFOL N/A 03/27/2017   Procedure: COLONOSCOPY WITH PROPOFOL;  Surgeon: Manya Silvas, MD;  Location: [facility] ENDOSCOPY;  Service: Endoscopy;  Laterality: N/A;   CORONARY ANGIOPLASTY     CORONARY ARTERY BYPASS GRAFT     ESOPHAGOGASTRODUODENOSCOPY N/A 01/14/2021   Procedure: ESOPHAGOGASTRODUODENOSCOPY (EGD);  Surgeon: [place], Benay Pike, MD;  Location: [facility] ENDOSCOPY;  Service: Gastroenterology;  Laterality: N/A;   ESOPHAGOGASTRODUODENOSCOPY (EGD) WITH PROPOFOL N/A 03/27/2017   Procedure: ESOPHAGOGASTRODUODENOSCOPY (EGD) WITH PROPOFOL;  Surgeon: Manya Silvas, MD;  Location: [facility] ENDOSCOPY;  Service: Endoscopy;  Laterality: N/A;   EXCISION PARTIAL PHALANX Right 09/10/2017   Procedure: EXCISION PARTIAL PHALANX-2nd toe;  Surgeon: Albertine Patricia, DPM;  Location: [place];  Service: Podiatry;  Laterality: Right;  sleep apnea   EYE SURGERY     HERNIA REMOVED     HERNIA REPAIR     kissing spine syndrome     KYPHOPLASTY N/A 03/23/2020   Procedure: KYPHOPLASTY L1;  Surgeon: Hessie Knows, MD;  Location: [facility] ORS;  Service: Orthopedics;  Laterality: N/A;   KYPHOPLASTY N/A 05/15/2020   Procedure: T 12 Kyphoplasty;  Surgeon: Hessie Knows, MD;  Location: [facility] ORS;  Service: Orthopedics;  Laterality: N/A;   kyphoplasty t10 t11     LEFT HEART CATH AND CORONARY ANGIOGRAPHY Left 09/28/2018   Procedure: LEFT HEART CATH AND CORONARY ANGIOGRAPHY;  Surgeon: Teodoro Spray, MD;  Location: [place] [facility];  Service: Cardiovascular;  Laterality: Left;   OPEN HEART SURGERY     RETROPUBIC PROSTATECTOMY     SKIN CANCER  REMOVED      Prior to Admission medications   Medication Sig Start Date End Date Taking? Authorizing Provider  dicyclomine (BENTYL) 20 MG tablet Take 1 tablet (20 mg total) by mouth every 8 (eight) hours as needed for spasms (Abdominal cramping). 03/29/21  Yes Brayln Duque N, DO  ondansetron (ZOFRAN ODT) 4 MG disintegrating tablet Take 1 tablet (4 mg total) by mouth every 6 (six) hours as needed for nausea or vomiting. 03/29/21  Yes Melaya Hoselton, Delice Bison, DO  acetaminophen (TYLENOL) 500 MG tablet Take 1,000 mg by mouth every 8 (eight) hours as needed.    [provider]  albuterol (PROVENTIL HFA;VENTOLIN HFA) 108 (90 Base) MCG/ACT inhaler Inhale 2 puffs into the lungs every 4 (four) hours as needed for wheezing or shortness of breath.    [provider]  aspirin EC 81 MG tablet Take 81 mg by mouth daily.    [provider]  atorvastatin (LIPITOR) 80  MG tablet Take 40 mg by mouth at bedtime.     [provider]  Calcium Carbonate-Vitamin D 600-400 MG-UNIT tablet Take 1 tablet by mouth 2 (two) times daily.    [provider]  cholecalciferol (VITAMIN D) 1000 units tablet Take 3,000 Units by mouth daily.     [provider]  cyanocobalamin 1000 MCG tablet Take 1,000 mcg by mouth at bedtime.     [provider]  fexofenadine (ALLEGRA) 180 MG tablet Take 180 mg by mouth daily before breakfast.     [provider]  fluticasone (FLONASE) 50 MCG/ACT nasal spray Place 2 sprays into both nostrils at bedtime.    [provider]  gabapentin (NEURONTIN) 300 MG capsule Take 300 mg by mouth at bedtime.     [provider]  hydrocortisone (ANUSOL-HC) 25 MG suppository Place 25 mg rectally 2 (two) times daily as needed for hemorrhoids or anal itching.     [provider]  isosorbide mononitrate (IMDUR) 60 MG 24 hr tablet Take 60 mg by mouth daily.    [provider]  Magnesium 400 MG CAPS Take 400 mg by mouth 2 (two)  times daily.     [provider]  metoprolol tartrate (LOPRESSOR) 25 MG tablet Take 25 mg by mouth 2 (two) times daily.     [provider]  Multiple Vitamins-Minerals (EMERGEN-C VITAMIN C) PACK Take 1 packet by mouth daily.    [provider]  niacin 500 MG tablet Take 500 mg by mouth 3 (three) times daily.     [provider]  nitroGLYCERIN (NITROSTAT) 0.4 MG SL tablet Place 0.4 mg under the tongue every 5 (five) minutes x 3 doses as needed for chest pain. If no relief call MD or go to emergency room.    [provider]  Omega-3 Fatty Acids (FISH OIL) 1000 MG CAPS Take 1,000 mg by mouth 2 (two) times daily.    [provider]  omeprazole (PRILOSEC) 40 MG capsule Take 1 capsule by mouth 2 (two) times daily. 10/02/20   [provider]  ondansetron (ZOFRAN) 4 MG tablet Take 1 tablet (4 mg total) by mouth every 8 (eight) hours as needed for nausea or vomiting. 03/15/21   Loletha Grayer, MD  polyethylene glycol (MIRALAX / GLYCOLAX) 17 g packet Take 17 g by mouth daily as needed for moderate constipation. 03/15/21   Loletha Grayer, MD  potassium chloride SA (KLOR-CON) 20 MEQ tablet Take 1 tablet (20 mEq total) by mouth daily. 03/15/21   Loletha Grayer, MD  Probiotic CAPS Take 2 capsules by mouth at bedtime.    [provider]  senna-docusate (SENOKOT-S) 8.6-50 MG tablet Take 2 tablets by mouth 2 (two) times daily.    [provider]  sucralfate (CARAFATE) 1 g tablet Take 1 g by mouth 4 (four) times daily. 09/27/20   [provider]  TART CHERRY PO Take 1 capsule by mouth daily.    [provider]  Tiotropium Bromide-Olodaterol 2.5-2.5 MCG/ACT AERS Inhale 2 puffs into the lungs daily.    [provider]  torsemide (DEMADEX) 20 MG tablet Take 60 mg by mouth daily.     [provider]  traMADol (ULTRAM) 50 MG tablet Take 50 mg by mouth in the morning.    [provider]  Wheat  Dextrin (BENEFIBER DRINK MIX PO) Take 1 Dose by mouth daily.     [provider]    Allergies Hydrochlorothiazide w-triamterene  Family History  Problem Relation Age of Onset   Cancer Father    Prostate cancer Father    Bladder Cancer Neg Hx    Kidney cancer Neg Hx     Social History Social History   Tobacco Use   Smoking status: Former    Years: 35.00    Pack years: 0.00    Types: Cigarettes    Quit date: 06/26/1990    Years since quitting: 30.7   Smokeless tobacco: Never  Vaping Use   Vaping Use: Never used  Substance Use Topics   Alcohol use: No    Alcohol/week: 0.0 standard drinks   Drug use: No    Review of Systems Constitutional: No fever. Eyes: No visual changes. ENT: No sore throat. Cardiovascular: Denies chest pain. Respiratory: Denies shortness of breath. Gastrointestinal: + Nausea.  No vomiting or diarrhea. Genitourinary: Negative for dysuria. Musculoskeletal: Negative for back pain. Skin: Negative for rash. Neurological: Negative for focal weakness or numbness.  ____________________________________________   PHYSICAL EXAM:  VITAL SIGNS: ED Triage Vitals [03/28/21 2110]  Enc Vitals Group     BP 131/81     Pulse Rate (!) 50     Resp 18     Temp 98.2 F (36.8 C)     Temp Source Oral     SpO2 93 %     Weight      Height      Head Circumference      Peak Flow      Pain Score      Pain Loc      Pain Edu?      Excl. in [place]?    CONSTITUTIONAL: Alert and oriented and responds appropriately to questions.  Elderly, appears uncomfortable, nontoxic HEAD: Normocephalic EYES: Conjunctivae clear, pupils appear equal, EOM appear intact ENT: normal nose; moist mucous membranes NECK: Supple, normal ROM CARD: RRR; S1 and S2 appreciated; no murmurs, no clicks, no rubs, no gallops RESP: Normal chest excursion without splinting or tachypnea; breath sounds clear and equal bilaterally; no wheezes, no rhonchi, no rales, no hypoxia or respiratory  distress, speaking full sentences ABD/GI: Normal bowel sounds; non-distended; soft, tender to palpation diffusely, has a large left inguinal hernia without overlying skin changes.  Initially I was not able to reduce this hernia but once placed in Trendelenburg, hernia reduced easily.  After reduction of hernia, patient still tender to palpation diffusely throughout the lower abdomen.  Patient has a cystic lesion to the left shaft of the penis which he reports has been there for 10 years.  No surrounding redness, warmth, bleeding or drainage.  This area is nontender to palpation. BACK: The back appears normal EXT: Normal ROM in all joints; no deformity noted, no edema; no cyanosis SKIN: Normal color for age and race; warm; no rash on exposed skin NEURO: Moves all extremities equally PSYCH: The patient's mood and manner are appropriate.  ____________________________________________   LABS (all labs ordered are listed, but only abnormal results are displayed)  Labs Reviewed  COMPREHENSIVE METABOLIC PANEL - Abnormal; Notable for the following components:      Result Value   Sodium 125 (*)    Chloride 92 (*)    Glucose, Bld 122 (*)    Calcium 8.8 (*)    Albumin 3.4 (*)    All other components within normal limits  CBC - Abnormal; Notable for the following components:   RBC 3.82 (*)    Hemoglobin 12.5 (*)    HCT 36.8 (*)    All  other components within normal limits  URINALYSIS, COMPLETE (UACMP) WITH MICROSCOPIC - Abnormal; Notable for the following components:   Color, Urine COLORLESS (*)    APPearance CLEAR (*)    All other components within normal limits  LIPASE, BLOOD   ____________________________________________  EKG   ____________________________________________  RADIOLOGY I, Noor Vidales, personally viewed and evaluated these images (plain radiographs) as part of my medical decision making, as well as reviewing the written report by the radiologist.  ED MD interpretation:  CT scan shows enteritis.  Official radiology report(s): CT ABDOMEN PELVIS W CONTRAST  Result Date: 03/29/2021 CLINICAL DATA:  Acute abdominal pain.  Inguinal pain. EXAM: CT ABDOMEN AND PELVIS WITH CONTRAST TECHNIQUE: Multidetector CT imaging of the abdomen and pelvis was performed using the standard protocol following bolus administration of intravenous contrast. CONTRAST:  137mL OMNIPAQUE IOHEXOL 350 MG/ML SOLN COMPARISON:  03/11/2019 FINDINGS: Lower chest: Atelectasis or consolidation in the lung bases, greater on the left. Postoperative changes in the mediastinum. Moderate-sized esophageal hiatal hernia. Hepatobiliary: No focal liver abnormality is seen. Status post cholecystectomy. No biliary dilatation. Pancreas: Unremarkable. No pancreatic ductal dilatation or surrounding inflammatory changes. Spleen: Normal in size without focal abnormality. Adrenals/Urinary Tract: No adrenal gland nodules. Right renal cyst. Stone in the lower pole left kidney measuring 8 mm diameter. No hydronephrosis or hydroureter. No ureteral stones. Bladder is unremarkable. Stomach/Bowel: Stomach, small bowel, and colon are not abnormally distended. Left lower quadrant small bowel loops demonstrate wall thickening and mesenteric edema suggesting probable enteritis. No pneumatosis. Since the previous study, herniated bowel seen previously in the left inguinal region has been reduced. The small bowel changes may be sequela from the recent bowel herniation. Partially fluid-filled colon without wall thickening consistent with diarrhea. Diverticulosis of the sigmoid colon without evidence of diverticulitis. Appendix is not identified. Vascular/Lymphatic: Aortic atherosclerosis. No enlarged abdominal or pelvic lymph nodes. Reproductive: Surgical absence of the prostate gland. Small amount of free fluid in the pelvis is nonspecific, possibly reactive. There is suggestion of a focal cystic lesion on the shaft of the penis measuring 11 mm in  diameter. Correlation with physical examination is recommended. Other: No free air in the abdomen. Scarring in the anterior abdominal wall. Interval reduction of previous left inguinal hernia with small residual left inguinal hernia containing fat. Musculoskeletal: Multiple vertebral compression deformities post kyphoplasty. IMPRESSION: 1. Atelectasis or consolidation in the lung bases. 2. Moderate-sized esophageal hiatal hernia. 3. Nonobstructing stone in the lower pole left kidney. 4. Thick-walled fluid-filled small bowel in the left lower quadrant with mesenteric edema suggesting enteritis. No obstruction. 5. Cystic lesions suggested on the shaft of the penis. Correlation with physical examination is recommended. 6. Prominent aortic atherosclerosis. 7. Interval reduction of the herniated bowel from the left inguinal region previously. Small residual left inguinal hernia containing fat. 8. Multiple prior kyphoplasty procedures. 9. Small amount of free fluid in the pelvis is likely reactive. 10. Fluid in the colon likely indicates diarrhea. Electronically Signed   By: Lucienne Capers M.D.   On: 03/29/2021 03:12    ____________________________________________   PROCEDURES  Procedure(s) performed (including Critical Care):  Procedures    ____________________________________________   INITIAL IMPRESSION / ASSESSMENT AND PLAN / ED COURSE  As part of my medical decision making, I reviewed the following data within the [place] History obtained from family, Nursing notes reviewed and incorporated, Labs reviewed , Old chart reviewed, Radiograph reviewed , and Notes from prior ED visits  Patient here with abdominal pain.  Has a large left inguinal hernia that was reducible in the ED with Trendelenburg positioning.  Still tender however after reduction.  Feels warm to touch.  Will obtain rectal temperature and CT of the abdomen pelvis.  Recently admitted for small bowel  obstruction.  Will give IV pain and nausea medicine.  Labs show mild hyponatremia with sodium of 125.  Will give IV fluids.  He appears dehydrated on exam reports decreased oral intake.  Otherwise labs unremarkable.  Urine pending.  ED PROGRESS  CT of the abdomen pelvis shows no bowel obstruction.  He has a small residual left inguinal hernia that contains fat only.  He has a moderate size esophageal hiatal hernia.  He has some signs suggestive of enteritis and fluid in the colon indicating diarrhea.  Discussed with patient and family at bedside that I suspect he may have a viral illness.  He states he feels he is constipated.  He was given an enema here without much relief.  He states he is feeling better but now having back pain from lying in the bed and wishes to go home.  Will give information for over-the-counter medications to use as needed for constipation but discussed with patient and wife that CT scan does not show any significant constipation today.  Will discharge with prescription for Bentyl, Zofran.  He has outpatient general surgery follow-up for his inguinal hernia which is currently reduced.  No sign of incarceration, strangulation.  He has been able to tolerate p.o. here today.  I feel he is safe to be discharged home.  At this time, I do not feel there is any life-threatening condition present. I have reviewed, interpreted and discussed all results (EKG, imaging, lab, urine as appropriate) and exam findings with patient/family. I have reviewed nursing notes and appropriate previous records.  I feel the patient is safe to be discharged home without further emergent workup and can continue workup as an outpatient as needed. Discussed usual and customary return precautions. Patient/family verbalize understanding and are comfortable with this plan.  Outpatient follow-up has been provided as needed. All questions have been answered.  ____________________________________________   FINAL  CLINICAL IMPRESSION(S) / ED DIAGNOSES  Final diagnoses:  Left inguinal hernia  Enteritis  Hyponatremia     ED Discharge Orders          Ordered    ondansetron (ZOFRAN ODT) 4 MG disintegrating tablet  Every 6 hours PRN        03/29/21 0557    dicyclomine (BENTYL) 20 MG tablet  Every 8 hours PRN        03/29/21 0557            *Please note:  Justin [name] was evaluated in Emergency Department on 03/29/2021 for the symptoms described in the history of present illness. He was evaluated in the context of the global COVID-19 pandemic, which necessitated consideration that the patient might be at risk for infection with the SARS-CoV-2 virus that causes COVID-19. Institutional protocols and algorithms that pertain to the evaluation of patients at risk for COVID-19 are in a state of rapid change based on information released by regulatory bodies including the CDC and federal and state organizations. These policies and algorithms were followed during the patient's care in the ED.  Some ED evaluations and interventions may be delayed as a result of limited staffing during and the pandemic.*   Note:  This document was prepared using Systems analyst  and may include unintentional dictation errors.    Dajon Lazar, Delice Bison, DO 03/29/21 [phone]

## 2021-04-01 NOTE — Progress Notes (Unsigned)
Pulmonary clearance has been received from Dr Gust Brooms office. The patient is optimized for surgery and cleared at low risk for surgery.

## 2021-04-18 ENCOUNTER — Encounter: Payer: Self-pay | Admitting: General Surgery

## 2021-04-23 ENCOUNTER — Encounter: Payer: Self-pay | Admitting: General Surgery

## 2021-04-23 ENCOUNTER — Ambulatory Visit (INDEPENDENT_AMBULATORY_CARE_PROVIDER_SITE_OTHER): Payer: Medicare Other | Admitting: General Surgery

## 2021-04-23 ENCOUNTER — Other Ambulatory Visit: Payer: Self-pay

## 2021-04-23 ENCOUNTER — Telehealth: Payer: Self-pay | Admitting: General Surgery

## 2021-04-23 VITALS — BP 117/79 | HR 80 | Temp 98.0°F | Ht 68.0 in | Wt 163.6 lb

## 2021-04-23 DIAGNOSIS — K4090 Unilateral inguinal hernia, without obstruction or gangrene, not specified as recurrent: Secondary | ICD-10-CM | POA: Diagnosis not present

## 2021-04-23 NOTE — Patient Instructions (Addendum)
You have chose to have your hernia repaired. This will be done by Dr. Celine Ahr at [facility].  Stop your fish oil, vitamin E, and baby Aspirin now.   Please see your (blue) Pre-care information that you have been given today. Our surgery scheduler will call you to look at surgery dates and to go over information.   You will need to arrange to be out of work for 2 weeks and then return with a lifting restrictions for 4 more weeks. Please send any FMLA paperwork prior to surgery and we will fill this out and fax it back to your employer within 3 business days.  You may have a bruise in your groin and also swelling and brusing in your testicle area. You may use ice 4-5 times daily for 15-20 minutes each time. Make sure that you place a barrier between you and the ice pack. To decrease the swelling, you may roll up a bath towel and place it vertically in between your thighs with your testicles resting on the towel. You will want to keep this area elevated as much as possible for several days following surgery. You will have lifting restrictions for 4-6 weeks after surgery.    Inguinal Hernia, Adult Muscles help keep everything in the body in its proper place. But if a weak spot in the muscles develops, something can poke through. That is called a hernia. When this happens in the lower part of the belly (abdomen), it is called an inguinal hernia. (It takes its name from a part of the body in this region called the inguinal canal.) A weak spot in the wall of muscles lets some fat or part of the small intestine bulge through. An inguinal hernia can develop at any age. Men get them more often than women. CAUSES  In adults, an inguinal hernia develops over time. It can be triggered by: Suddenly straining the muscles of the lower abdomen. Lifting heavy objects. Straining to have a bowel movement. Difficult bowel movements (constipation) can lead to this. Constant coughing. This may be caused by smoking or lung  disease. Being overweight. Being pregnant. Working at a job that requires long periods of standing or heavy lifting. Having had an inguinal hernia before. One type can be an emergency situation. It is called a strangulated inguinal hernia. It develops if part of the small intestine slips through the weak spot and cannot get back into the abdomen. The blood supply can be cut off. If that happens, part of the intestine may die. This situation requires emergency surgery. SYMPTOMS  Often, a small inguinal hernia has no symptoms. It is found when a healthcare provider does a physical exam. Larger hernias usually have symptoms.  In adults, symptoms may include: A lump in the groin. This is easier to see when the person is standing. It might disappear when lying down. In men, a lump in the scrotum. Pain or burning in the groin. This occurs especially when lifting, straining or coughing. A dull ache or feeling of pressure in the groin. Signs of a strangulated hernia can include: A bulge in the groin that becomes very painful and tender to the touch. A bulge that turns red or purple. Fever, nausea and vomiting. Inability to have a bowel movement or to pass gas. DIAGNOSIS  To decide if you have an inguinal hernia, a healthcare provider will probably do a physical examination. This will include asking questions about any symptoms you have noticed. The healthcare provider might feel the  groin area and ask you to cough. If an inguinal hernia is felt, the healthcare provider may try to slide it back into the abdomen. Usually no other tests are needed. TREATMENT  Treatments can vary. The size of the hernia makes a difference. Options include: Watchful waiting. This is often suggested if the hernia is small and you have had no symptoms. No medical procedure will be done unless symptoms develop. You will need to watch closely for symptoms. If any occur, contact your healthcare provider right  away. Surgery. This is used if the hernia is larger or you have symptoms. Open surgery. This is usually an outpatient procedure (you will not stay overnight in a hospital). An cut (incision) is made through the skin in the groin. The hernia is put back inside the abdomen. The weak area in the muscles is then repaired by herniorrhaphy or hernioplasty. Herniorrhaphy: in this type of surgery, the weak muscles are sewn back together. Hernioplasty: a patch or mesh is used to close the weak area in the abdominal wall. Laparoscopy. In this procedure, a surgeon makes small incisions. A thin tube with a tiny video camera (called a laparoscope) is put into the abdomen. The surgeon repairs the hernia with mesh by looking with the video camera and using two long instruments. HOME CARE INSTRUCTIONS  After surgery to repair an inguinal hernia: You will need to take pain medicine prescribed by your healthcare provider. Follow all directions carefully. You will need to take care of the wound from the incision. Your activity will be restricted for awhile. This will probably include no heavy lifting for several weeks. You also should not do anything too active for a few weeks. When you can return to work will depend on the type of job that you have. During "watchful waiting" periods, you should: Maintain a healthy weight. Eat a diet high in fiber (fruits, vegetables and whole grains). Drink plenty of fluids to avoid constipation. This means drinking enough water and other liquids to keep your urine clear or pale yellow. Do not lift heavy objects. Do not stand for long periods of time. Quit smoking. This should keep you from developing a frequent cough. SEEK MEDICAL CARE IF:  A bulge develops in your groin area. You feel pain, a burning sensation or pressure in the groin. This might be worse if you are lifting or straining. You develop a fever of more than 100.5 F (38.1 C). SEEK IMMEDIATE MEDICAL CARE IF:  Pain  in the groin increases suddenly. A bulge in the groin gets bigger suddenly and does not go down. For men, there is sudden pain in the scrotum. Or, the size of the scrotum increases. A bulge in the groin area becomes red or purple and is painful to touch. You have nausea or vomiting that does not go away. You feel your heart beating much faster than normal. You cannot have a bowel movement or pass gas. You develop a fever of more than 102.0 F (38.9 C).   This information is not intended to replace advice given to you by your health care provider. Make sure you discuss any questions you have with your health care provider.   Document Released: 01/25/2009 Document Revised: 12/01/2011 Document Reviewed: 03/12/2015 Elsevier Interactive Patient Education [employer].

## 2021-04-23 NOTE — Telephone Encounter (Signed)
Left message for patient to call me so we can discuss surgery dates for scheduling.

## 2021-04-23 NOTE — Progress Notes (Signed)
Patient ID: Justin [name], male   DOB: 04/15/1941, 80 y.o.   MRN: [id number]  No chief complaint on file.   HPI Justin [name] is a 80 y.o. male.   I first saw him at the end of June.  My initial HPI is posted here:  Justin [name] was referred by Dr. Hollice Espy, urology, for evaluation of a left inguinal hernia.  She was seeing him for bladder pain and I have copied the initial portion of her consultation note here:   "HPI: 80 year old male with a personal history of prostate cancer who presents today for further evaluation of "bladder pain".  Justin [name] was previously patient of Dr. Jacqlyn Larsen at [facility] urology from which records are available via [place] today.   Notably, Justin [name] has a personal history of prostate cancer diagnosed in 2008.  Justin [name] underwent retropubic prostatectomy.  PSA has remained undetectable as of 07/2020.   Justin [name] reports pain with bladder filling which is relieved with urination.  This is been going on for several months.  In addition to this, Justin [name] has had a lot of GI upset which is just below the umbilicus which Justin [name] has been treated with Carafate.  Justin [name] believes the 2 issues may be related."   When Justin [name] returned to her office for cystoscopy on June 14, she identified a reducible left inguinal hernia.  She placed a referral to surgery, but before Justin [name] could be seen in our office, Justin [name] was admitted to the hospital with hypotension and dehydration secondary to nausea and vomiting.  Apparently, every 2 weeks since April of this year, Justin [name] has had episodes of what Justin [name] describes as constipation, resulting in nausea and vomiting.  This resolves once Justin [name] has a bowel movement.  Justin [name] is on a number of stool softeners and other agents to try and maintain regular bowel function.  When Justin [name] came into the hospital, in addition to pneumonia, Justin [name] was noted to have a left inguinal hernia that was initially thought to be incarcerated. General surgery was consulted, but at the time of evaluation, the hernia was reduced and it was felt that  Justin [name] was not in ideal condition to undergo an essentially elective procedure.  Justin [name] was adequately resuscitated and discharged from the hospital on June 24.  Justin [name] states that Justin [name] is weak, but feeling better.  Justin [name] would like to have his hernia repaired, but feels that Justin [name] needs to recuperate further before proceeding.  Justin [name] states that Justin [name] is able to reduce the hernia with pressure and it often spontaneously reduces when Justin [name] lies flat.  Justin [name] is normally a fairly active individual and wants to make sure that Justin [name] can continue to work on his physical conditioning without fear of hernia complications while Justin [name] awaits surgery.  Justin [name] does have a history of prior right inguinal hernia repair in the 1980s.  Justin [name] also has a history of exploratory laparotomy secondary to trauma in 1963 including repair of a liver laceration.  Due to his ongoing recovery from his illness, we delayed surgical intervention until Justin [name] could more fully recuperate.  Justin [name] has been cleared by his cardiologist and other medical providers to undergo repair of his hernia.  Since her last visit, Justin [name] has had several episodes where Justin [name] initially could not get the hernia reduced.  Ultimately, it did reduce, but each time it has happened, Justin [name] is experienced a significant degree of discomfort.  Justin [name] reports that "when your guts hurt, everything feels bad."  Justin [name] is eager to proceed with his  operation.   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   Wears dentures    partial upper and lower    Past Surgical History:  Procedure Laterality Date   BACK SURGERY     CARDIAC CATHETERIZATION N/A 11/07/2015   Procedure: Left Heart Cath and Coronary Angiography;  Surgeon: Teodoro Spray, MD;  Location: [place] [facility];  Service: Cardiovascular;  Laterality: N/A;   CARDIAC CATHETERIZATION N/A 11/07/2015   Procedure: Coronary Stent Intervention;  Surgeon: Isaias Cowman, MD;  Location: [place] [facility];  Service: Cardiovascular;  Laterality: N/A;   CATARACT EXTRACTION     CHOLECYSTECTOMY     COLONOSCOPY N/A 06/13/2020   Procedure: COLONOSCOPY;  Surgeon: Toledo, Benay Pike, MD;  Location: [facility] ENDOSCOPY;  Service: Gastroenterology;  Laterality: N/A;   COLONOSCOPY WITH PROPOFOL N/A 03/27/2017   Procedure: COLONOSCOPY WITH PROPOFOL;  Surgeon: Manya Silvas, MD;  Location: [facility] ENDOSCOPY;  Service: Endoscopy;  Laterality: N/A;   CORONARY ANGIOPLASTY     CORONARY ARTERY BYPASS GRAFT     ESOPHAGOGASTRODUODENOSCOPY N/A 01/14/2021   Procedure: ESOPHAGOGASTRODUODENOSCOPY (EGD);  Surgeon: Toledo, Benay Pike, MD;  Location: [facility] ENDOSCOPY;  Service: Gastroenterology;  Laterality: N/A;   ESOPHAGOGASTRODUODENOSCOPY (EGD) WITH PROPOFOL N/A 03/27/2017   Procedure: ESOPHAGOGASTRODUODENOSCOPY (EGD) WITH PROPOFOL;  Surgeon: Manya Silvas, MD;  Location: [facility] ENDOSCOPY;  Service: Endoscopy;  Laterality: N/A;   EXCISION PARTIAL PHALANX Right 09/10/2017   Procedure: EXCISION PARTIAL PHALANX-2nd toe;  Surgeon: Albertine Patricia, DPM;  Location: [place];  Service: Podiatry;  Laterality: Right;  sleep apnea   EYE SURGERY     HERNIA REMOVED     HERNIA REPAIR     kissing spine syndrome     KYPHOPLASTY N/A 03/23/2020   Procedure: KYPHOPLASTY L1;  Surgeon:  Hessie Knows, MD;  Location: [facility] ORS;  Service: Orthopedics;  Laterality: N/A;   KYPHOPLASTY N/A 05/15/2020   Procedure: T 12 Kyphoplasty;  Surgeon: Hessie Knows, MD;  Location: [facility] ORS;  Service: Orthopedics;  Laterality: N/A;   kyphoplasty t10 t11     LEFT HEART CATH AND CORONARY ANGIOGRAPHY Left 09/28/2018   Procedure: LEFT HEART CATH AND CORONARY ANGIOGRAPHY;  Surgeon: Teodoro Spray, MD;  Location: [place] [facility];  Service: Cardiovascular;  Laterality: Left;   OPEN HEART SURGERY     RETROPUBIC PROSTATECTOMY     SKIN CANCER REMOVED      Family History  Problem Relation Age of Onset   Cancer Father    Prostate cancer Father    Bladder Cancer Neg Hx  Kidney cancer Neg Hx     Social History Social History   Tobacco Use   Smoking status: Former    Years: 35.00    Types: Cigarettes    Quit date: 06/26/1990    Years since quitting: 30.8   Smokeless tobacco: Never  Vaping Use   Vaping Use: Never used  Substance Use Topics   Alcohol use: No    Alcohol/week: 0.0 standard drinks   Drug use: No    Allergies  Allergen Reactions   Hydrochlorothiazide W-Triamterene Nausea Only    Current Outpatient Medications  Medication Sig Dispense Refill   acetaminophen (TYLENOL) 500 MG tablet Take 1,000 mg by mouth every 8 (eight) hours as needed.     albuterol (PROVENTIL HFA;VENTOLIN HFA) 108 (90 Base) MCG/ACT inhaler Inhale 2 puffs into the lungs every 4 (four) hours as needed for wheezing or shortness of breath.     atorvastatin (LIPITOR) 80 MG tablet Take 40 mg by mouth at bedtime.      Calcium Carbonate-Vitamin D 600-400 MG-UNIT tablet Take 1 tablet by mouth 2 (two) times daily.     cholecalciferol (VITAMIN D) 1000 units tablet Take 3,000 Units by mouth daily.      cyanocobalamin 1000 MCG tablet Take 1,000 mcg by mouth at bedtime.      dicyclomine (BENTYL) 20 MG tablet Take 1 tablet (20 mg total) by mouth every 8 (eight) hours as needed for spasms (Abdominal cramping). 15  tablet 0   fexofenadine (ALLEGRA) 180 MG tablet Take 180 mg by mouth daily before breakfast.      fluticasone (FLONASE) 50 MCG/ACT nasal spray Place 2 sprays into both nostrils at bedtime.     gabapentin (NEURONTIN) 300 MG capsule Take 300 mg by mouth at bedtime.      hydrocortisone (ANUSOL-HC) 25 MG suppository Place 25 mg rectally 2 (two) times daily as needed for hemorrhoids or anal itching.      isosorbide mononitrate (IMDUR) 60 MG 24 hr tablet Take 60 mg by mouth daily.     Magnesium 400 MG CAPS Take 400 mg by mouth 2 (two) times daily.      metoprolol tartrate (LOPRESSOR) 25 MG tablet Take 25 mg by mouth 2 (two) times daily.      Multiple Vitamins-Minerals (EMERGEN-C VITAMIN C) PACK Take 1 packet by mouth daily.     niacin 500 MG tablet Take 500 mg by mouth 3 (three) times daily.      nitroGLYCERIN (NITROSTAT) 0.4 MG SL tablet Place 0.4 mg under the tongue every 5 (five) minutes x 3 doses as needed for chest pain. If no relief call MD or go to emergency room.     Omega-3 Fatty Acids (FISH OIL) 1000 MG CAPS Take 1,000 mg by mouth 2 (two) times daily.     omeprazole (PRILOSEC) 40 MG capsule Take 1 capsule by mouth 2 (two) times daily.     polyethylene glycol (MIRALAX / GLYCOLAX) 17 g packet Take 17 g by mouth daily as needed for moderate constipation. 30 each 0   potassium chloride SA (KLOR-CON) 20 MEQ tablet Take 1 tablet (20 mEq total) by mouth daily.     Probiotic CAPS Take 2 capsules by mouth at bedtime.     promethazine (PHENERGAN) 12.5 MG tablet Take 12.5 mg by mouth every 6 (six) hours as needed for nausea or vomiting.     senna-docusate (SENOKOT-S) 8.6-50 MG tablet Take 2 tablets by mouth 2 (two) times daily.     sucralfate (CARAFATE)  1 g tablet Take 1 g by mouth 4 (four) times daily.     TART CHERRY PO Take 1 capsule by mouth daily.     Tiotropium Bromide-Olodaterol 2.5-2.5 MCG/ACT AERS Inhale 2 puffs into the lungs daily.     torsemide (DEMADEX) 20 MG tablet Take 60 mg by mouth  daily.      Wheat Dextrin (BENEFIBER DRINK MIX PO) Take 1 Dose by mouth daily.      aspirin EC 81 MG tablet Take 81 mg by mouth daily. (Patient not taking: Reported on 04/23/2021)     No current facility-administered medications for this visit.   Facility-Administered Medications Ordered in Other Visits  Medication Dose Route Frequency Provider Last Rate Last Admin   furosemide (LASIX) injection 80 mg  80 mg Intravenous Once Teodoro Spray, MD       potassium chloride (KLOR-CON) CR tablet 40 mEq  40 mEq Oral Daily Teodoro Spray, MD        Review of Systems Review of Systems  HENT:  Positive for hearing loss.   Respiratory:  Positive for shortness of breath.   Gastrointestinal:  Positive for abdominal pain, constipation, diarrhea, nausea and vomiting.  Genitourinary:  Positive for frequency.  All other systems reviewed and are negative. Or as discussed in the history of present illness.  Blood pressure 117/79, pulse 80, temperature 98 F (36.7 C), height '5\' 8"'$  (1.727 m), weight 163 lb 9.6 oz (74.2 kg), SpO2 95 %. Body mass index is 24.88 kg/m.  Physical Exam Physical Exam Vitals reviewed. Exam conducted with a chaperone present.  Constitutional:      General: Justin [name] is not in acute distress.    Appearance: Justin [name] is normal weight.  HENT:     Head: Normocephalic and atraumatic.     Ears:     Comments: Hearing aid present    Nose:     Comments: Covered with a mask    Mouth/Throat:     Comments: Covered with a mask Eyes:     General: No scleral icterus.       Right eye: No discharge.        Left eye: No discharge.  Neck:     Comments: No palpable cervical or supraclavicular lymphadenopathy.  The trachea is midline.  No thyromegaly or dominant thyroid masses appreciated.  The gland moves freely with deglutition. Cardiovascular:     Rate and Rhythm: Normal rate and regular rhythm.     Pulses: Normal pulses.  Pulmonary:     Effort: Pulmonary effort is normal.     Breath sounds:  Normal breath sounds.  Abdominal:     General: Bowel sounds are normal.     Palpations: Abdomen is soft.  Genitourinary:    Comments: Reducible left inguinal hernia present. Musculoskeletal:        General: No swelling or tenderness.  Skin:    General: Skin is warm and dry.  Neurological:     General: No focal deficit present.     Mental Status: Justin [name] is alert and oriented to person, place, and time.  Psychiatric:        Mood and Affect: Mood normal.        Behavior: Behavior normal.    Data Reviewed I reviewed the clinic notes from various providers that have occurred since our last visit.  These include visits with his primary care provider, Dr. Sabra Heck on June 29, July 13, and July 27 which were addressing his recovery from left upper  lobe pneumonia.  Justin [name] also had a visit with Dr. Netty Starring on July 29 regarding diarrhea.  Additional clinic visits include pulmonology (Dr. Raul Del) for preoperative clearance and cardiology (Dr. Ubaldo Glassing) for same.  Both providers have determined that Justin [name] is optimized from a pulmonary and cardiac perspective for surgery.   Assessment This is an 80 year old man with a left inguinal hernia.  It has become increasingly symptomatic and Justin [name] is interested in repair.  Plan Due to prior exploratory laparotomy, as well as prostatectomy, I think this hernia is best approached from an open perspective.I have explained the procedure, risks, and aftercare of inguinal hernia repair to [name].   Risks include but are not limited to bleeding, infection, wound problems, anesthesia, recurrence, bladder or intestine injury, urinary retention, testicular dysfunction, chronic pain, mesh problems.  Justin [name]  seems to understand and agrees to proceed.  Questions were answered to his stated satisfaction.  Due to his multiple medical comorbidities, we will arrange for an anesthesiology consultation and I plan to watch him overnight in the hospital due to those and his advanced  age.     Fredirick Maudlin 04/23/2021, 9:11 AM

## 2021-04-23 NOTE — H&P (View-Only) (Signed)
Patient ID: Justin [name], male   DOB: 05-Oct-1940, 80 y.o.   MRN: [id number]  No chief complaint on file.   HPI Justin [name] is a 80 y.o. male.   I first saw him at the end of June.  My initial HPI is posted here:  He was referred by Dr. Hollice Espy, urology, for evaluation of a left inguinal hernia.  She was seeing him for bladder pain and I have copied the initial portion of her consultation note here:   "HPI: 80 year old male with a personal history of prostate cancer who presents today for further evaluation of "bladder pain".  He was previously patient of Dr. Jacqlyn Larsen at [facility] urology from which records are available via [place] today.   Notably, he has a personal history of prostate cancer diagnosed in 2008.  He underwent retropubic prostatectomy.  PSA has remained undetectable as of 07/2020.   He reports pain with bladder filling which is relieved with urination.  This is been going on for several months.  In addition to this, he has had a lot of GI upset which is just below the umbilicus which he has been treated with Carafate.  He believes the 2 issues may be related."   When he returned to her office for cystoscopy on June 14, she identified a reducible left inguinal hernia.  She placed a referral to surgery, but before he could be seen in our office, he was admitted to the hospital with hypotension and dehydration secondary to nausea and vomiting.  Apparently, every 2 weeks since April of this year, he has had episodes of what he describes as constipation, resulting in nausea and vomiting.  This resolves once he has a bowel movement.  He is on a number of stool softeners and other agents to try and maintain regular bowel function.  When he came into the hospital, in addition to pneumonia, he was noted to have a left inguinal hernia that was initially thought to be incarcerated. General surgery was consulted, but at the time of evaluation, the hernia was reduced and it was felt that  he was not in ideal condition to undergo an essentially elective procedure.  He was adequately resuscitated and discharged from the hospital on June 24.  He states that he is weak, but feeling better.  He would like to have his hernia repaired, but feels that he needs to recuperate further before proceeding.  He states that he is able to reduce the hernia with pressure and it often spontaneously reduces when he lies flat.  He is normally a fairly active individual and wants to make sure that he can continue to work on his physical conditioning without fear of hernia complications while he awaits surgery.  He does have a history of prior right inguinal hernia repair in the 1980s.  He also has a history of exploratory laparotomy secondary to trauma in 1963 including repair of a liver laceration.  Due to his ongoing recovery from his illness, we delayed surgical intervention until he could more fully recuperate.  He has been cleared by his cardiologist and other medical providers to undergo repair of his hernia.  Since her last visit, he has had several episodes where he initially could not get the hernia reduced.  Ultimately, it did reduce, but each time it has happened, he is experienced a significant degree of discomfort.  He reports that "when your guts hurt, everything feels bad."  He is eager to proceed with his  operation.   Past Medical History:  Diagnosis Date   Adenomatous polyps    Anginal pain ([place])    Arthritis    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   BPH (benign prostatic hyperplasia)    CAD (coronary artery disease)    Cataract    Chronic kidney disease    COPD (chronic obstructive pulmonary disease) (HCC)    DJD (degenerative joint disease)    Does use hearing aid    bilateral   Dyspnea    Dysrhythmia    GERD (gastroesophageal reflux disease)    History of kidney stones    History of open heart surgery    Hypertension    Myocardial infarction [facility])    Osteoporosis    Prostate  cancer ([place])    Skin cancer    Sleep apnea    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   Wears dentures    partial upper and lower    Past Surgical History:  Procedure Laterality Date   BACK SURGERY     CARDIAC CATHETERIZATION N/A 11/07/2015   Procedure: Left Heart Cath and Coronary Angiography;  Surgeon: Teodoro Spray, MD;  Location: [place] CV LAB;  Service: Cardiovascular;  Laterality: N/A;   CARDIAC CATHETERIZATION N/A 11/07/2015   Procedure: Coronary Stent Intervention;  Surgeon: Isaias Cowman, MD;  Location: [place] CV LAB;  Service: Cardiovascular;  Laterality: N/A;   CATARACT EXTRACTION     CHOLECYSTECTOMY     COLONOSCOPY N/A 06/13/2020   Procedure: COLONOSCOPY;  Surgeon: Toledo, Benay Pike, MD;  Location: [facility] ENDOSCOPY;  Service: Gastroenterology;  Laterality: N/A;   COLONOSCOPY WITH PROPOFOL N/A 03/27/2017   Procedure: COLONOSCOPY WITH PROPOFOL;  Surgeon: Manya Silvas, MD;  Location: [facility] ENDOSCOPY;  Service: Endoscopy;  Laterality: N/A;   CORONARY ANGIOPLASTY     CORONARY ARTERY BYPASS GRAFT     ESOPHAGOGASTRODUODENOSCOPY N/A 01/14/2021   Procedure: ESOPHAGOGASTRODUODENOSCOPY (EGD);  Surgeon: Toledo, Benay Pike, MD;  Location: [facility] ENDOSCOPY;  Service: Gastroenterology;  Laterality: N/A;   ESOPHAGOGASTRODUODENOSCOPY (EGD) WITH PROPOFOL N/A 03/27/2017   Procedure: ESOPHAGOGASTRODUODENOSCOPY (EGD) WITH PROPOFOL;  Surgeon: Manya Silvas, MD;  Location: [facility] ENDOSCOPY;  Service: Endoscopy;  Laterality: N/A;   EXCISION PARTIAL PHALANX Right 09/10/2017   Procedure: EXCISION PARTIAL PHALANX-2nd toe;  Surgeon: Albertine Patricia, DPM;  Location: [place];  Service: Podiatry;  Laterality: Right;  sleep apnea   EYE SURGERY     HERNIA REMOVED     HERNIA REPAIR     kissing spine syndrome     KYPHOPLASTY N/A 03/23/2020   Procedure: KYPHOPLASTY L1;  Surgeon:  Hessie Knows, MD;  Location: [facility] ORS;  Service: Orthopedics;  Laterality: N/A;   KYPHOPLASTY N/A 05/15/2020   Procedure: T 12 Kyphoplasty;  Surgeon: Hessie Knows, MD;  Location: [facility] ORS;  Service: Orthopedics;  Laterality: N/A;   kyphoplasty t10 t11     LEFT HEART CATH AND CORONARY ANGIOGRAPHY Left 09/28/2018   Procedure: LEFT HEART CATH AND CORONARY ANGIOGRAPHY;  Surgeon: Teodoro Spray, MD;  Location: [place] [facility];  Service: Cardiovascular;  Laterality: Left;   OPEN HEART SURGERY     RETROPUBIC PROSTATECTOMY     SKIN CANCER REMOVED      Family History  Problem Relation Age of Onset   Cancer Father    Prostate cancer Father    Bladder Cancer Neg Hx  Kidney cancer Neg Hx     Social History Social History   Tobacco Use   Smoking status: Former    Years: 35.00    Types: Cigarettes    Quit date: 06/26/1990    Years since quitting: 30.8   Smokeless tobacco: Never  Vaping Use   Vaping Use: Never used  Substance Use Topics   Alcohol use: No    Alcohol/week: 0.0 standard drinks   Drug use: No    Allergies  Allergen Reactions   Hydrochlorothiazide W-Triamterene Nausea Only    Current Outpatient Medications  Medication Sig Dispense Refill   acetaminophen (TYLENOL) 500 MG tablet Take 1,000 mg by mouth every 8 (eight) hours as needed.     albuterol (PROVENTIL HFA;VENTOLIN HFA) 108 (90 Base) MCG/ACT inhaler Inhale 2 puffs into the lungs every 4 (four) hours as needed for wheezing or shortness of breath.     atorvastatin (LIPITOR) 80 MG tablet Take 40 mg by mouth at bedtime.      Calcium Carbonate-Vitamin D 600-400 MG-UNIT tablet Take 1 tablet by mouth 2 (two) times daily.     cholecalciferol (VITAMIN D) 1000 units tablet Take 3,000 Units by mouth daily.      cyanocobalamin 1000 MCG tablet Take 1,000 mcg by mouth at bedtime.      dicyclomine (BENTYL) 20 MG tablet Take 1 tablet (20 mg total) by mouth every 8 (eight) hours as needed for spasms (Abdominal cramping). 15  tablet 0   fexofenadine (ALLEGRA) 180 MG tablet Take 180 mg by mouth daily before breakfast.      fluticasone (FLONASE) 50 MCG/ACT nasal spray Place 2 sprays into both nostrils at bedtime.     gabapentin (NEURONTIN) 300 MG capsule Take 300 mg by mouth at bedtime.      hydrocortisone (ANUSOL-HC) 25 MG suppository Place 25 mg rectally 2 (two) times daily as needed for hemorrhoids or anal itching.      isosorbide mononitrate (IMDUR) 60 MG 24 hr tablet Take 60 mg by mouth daily.     Magnesium 400 MG CAPS Take 400 mg by mouth 2 (two) times daily.      metoprolol tartrate (LOPRESSOR) 25 MG tablet Take 25 mg by mouth 2 (two) times daily.      Multiple Vitamins-Minerals (EMERGEN-C VITAMIN C) PACK Take 1 packet by mouth daily.     niacin 500 MG tablet Take 500 mg by mouth 3 (three) times daily.      nitroGLYCERIN (NITROSTAT) 0.4 MG SL tablet Place 0.4 mg under the tongue every 5 (five) minutes x 3 doses as needed for chest pain. If no relief call MD or go to emergency room.     Omega-3 Fatty Acids (FISH OIL) 1000 MG CAPS Take 1,000 mg by mouth 2 (two) times daily.     omeprazole (PRILOSEC) 40 MG capsule Take 1 capsule by mouth 2 (two) times daily.     polyethylene glycol (MIRALAX / GLYCOLAX) 17 g packet Take 17 g by mouth daily as needed for moderate constipation. 30 each 0   potassium chloride SA (KLOR-CON) 20 MEQ tablet Take 1 tablet (20 mEq total) by mouth daily.     Probiotic CAPS Take 2 capsules by mouth at bedtime.     promethazine (PHENERGAN) 12.5 MG tablet Take 12.5 mg by mouth every 6 (six) hours as needed for nausea or vomiting.     senna-docusate (SENOKOT-S) 8.6-50 MG tablet Take 2 tablets by mouth 2 (two) times daily.     sucralfate (CARAFATE)  1 g tablet Take 1 g by mouth 4 (four) times daily.     TART CHERRY PO Take 1 capsule by mouth daily.     Tiotropium Bromide-Olodaterol 2.5-2.5 MCG/ACT AERS Inhale 2 puffs into the lungs daily.     torsemide (DEMADEX) 20 MG tablet Take 60 mg by mouth  daily.      Wheat Dextrin (BENEFIBER DRINK MIX PO) Take 1 Dose by mouth daily.      aspirin EC 81 MG tablet Take 81 mg by mouth daily. (Patient not taking: Reported on 04/23/2021)     No current facility-administered medications for this visit.   Facility-Administered Medications Ordered in Other Visits  Medication Dose Route Frequency Provider Last Rate Last Admin   furosemide (LASIX) injection 80 mg  80 mg Intravenous Once Teodoro Spray, MD       potassium chloride (KLOR-CON) CR tablet 40 mEq  40 mEq Oral Daily Teodoro Spray, MD        Review of Systems Review of Systems  HENT:  Positive for hearing loss.   Respiratory:  Positive for shortness of breath.   Gastrointestinal:  Positive for abdominal pain, constipation, diarrhea, nausea and vomiting.  Genitourinary:  Positive for frequency.  All other systems reviewed and are negative. Or as discussed in the history of present illness.  Blood pressure 117/79, pulse 80, temperature 98 F (36.7 C), height '5\' 8"'$  (1.727 m), weight 163 lb 9.6 oz (74.2 kg), SpO2 95 %. Body mass index is 24.88 kg/m.  Physical Exam Physical Exam Vitals reviewed. Exam conducted with a chaperone present.  Constitutional:      General: He is not in acute distress.    Appearance: He is normal weight.  HENT:     Head: Normocephalic and atraumatic.     Ears:     Comments: Hearing aid present    Nose:     Comments: Covered with a mask    Mouth/Throat:     Comments: Covered with a mask Eyes:     General: No scleral icterus.       Right eye: No discharge.        Left eye: No discharge.  Neck:     Comments: No palpable cervical or supraclavicular lymphadenopathy.  The trachea is midline.  No thyromegaly or dominant thyroid masses appreciated.  The gland moves freely with deglutition. Cardiovascular:     Rate and Rhythm: Normal rate and regular rhythm.     Pulses: Normal pulses.  Pulmonary:     Effort: Pulmonary effort is normal.     Breath sounds:  Normal breath sounds.  Abdominal:     General: Bowel sounds are normal.     Palpations: Abdomen is soft.  Genitourinary:    Comments: Reducible left inguinal hernia present. Musculoskeletal:        General: No swelling or tenderness.  Skin:    General: Skin is warm and dry.  Neurological:     General: No focal deficit present.     Mental Status: He is alert and oriented to person, place, and time.  Psychiatric:        Mood and Affect: Mood normal.        Behavior: Behavior normal.    Data Reviewed I reviewed the clinic notes from various providers that have occurred since our last visit.  These include visits with his primary care provider, Dr. Sabra Heck on June 29, July 13, and July 27 which were addressing his recovery from left upper  lobe pneumonia.  He also had a visit with Dr. Netty Starring on July 29 regarding diarrhea.  Additional clinic visits include pulmonology (Dr. Raul Del) for preoperative clearance and cardiology (Dr. Ubaldo Glassing) for same.  Both providers have determined that he is optimized from a pulmonary and cardiac perspective for surgery.   Assessment This is an 80 year old man with a left inguinal hernia.  It has become increasingly symptomatic and he is interested in repair.  Plan Due to prior exploratory laparotomy, as well as prostatectomy, I think this hernia is best approached from an open perspective.I have explained the procedure, risks, and aftercare of inguinal hernia repair to [name].   Risks include but are not limited to bleeding, infection, wound problems, anesthesia, recurrence, bladder or intestine injury, urinary retention, testicular dysfunction, chronic pain, mesh problems.  He  seems to understand and agrees to proceed.  Questions were answered to his stated satisfaction.  Due to his multiple medical comorbidities, we will arrange for an anesthesiology consultation and I plan to watch him overnight in the hospital due to those and his advanced  age.     Fredirick Maudlin 04/23/2021, 9:11 AM

## 2021-04-23 NOTE — Telephone Encounter (Signed)
Patient has been advised of Pre-Admission date/time, COVID Testing date and Surgery date.  Surgery Date: 04/29/21 Preadmission Testing Date: 04/25/21 (phone 1p-5p) Covid Testing Date: 04/26/21 @ 9:00 am, patient advised to go to the [facility] ([place])   Patient has been made aware to call [phone], between 1-3:00pm the day before surgery, to find out what time to arrive for surgery.

## 2021-04-25 ENCOUNTER — Other Ambulatory Visit
Admission: RE | Admit: 2021-04-25 | Discharge: 2021-04-25 | Disposition: A | Discharge status: Home / Self Care | Payer: Medicare Other | Source: Ambulatory Visit | Attending: General Surgery | Admitting: General Surgery

## 2021-04-25 ENCOUNTER — Encounter: Payer: Self-pay | Admitting: General Surgery

## 2021-04-25 ENCOUNTER — Other Ambulatory Visit: Payer: Self-pay

## 2021-04-25 HISTORY — DX: Pneumonia, unspecified organism: J18.9

## 2021-04-25 NOTE — Patient Instructions (Addendum)
Your procedure is scheduled on: 04/29/21 - Monday Report to the Registration Desk on the 1st floor of the [facility]. To find out your arrival time, please call [phone] between 1PM - 3PM on: 04/26/21 - Friday  Report to [facility] at 9:00 am for Covid Test on 04/26/21.  REMEMBER: Instructions that are not followed completely may result in serious medical risk, up to and including death; or upon the discretion of your surgeon and anesthesiologist your surgery may need to be rescheduled.  Do not eat food after midnight the night before surgery.  No gum chewing, lozengers or hard candies.  May have clear liquids up to 2 hours before your arrival time which includes: - water - apple juice - no pulp - Gatorade - not red - Black coffee- no milk or creamer - Tea  Do not drink anything that is not on this list.  TAKE THESE MEDICATIONS THE MORNING OF SURGERY WITH A SIP OF WATER:  - isosorbide mononitrate (IMDUR) 60 MG 24 hr tablet - metoprolol tartrate (LOPRESSOR) 25 MG tablet - omeprazole (PRILOSEC) 40 MG capsule - Tiotropium Bromide-Olodaterol 2.5-2.5 MCG/ACT AERS  Use albuterol (PROVENTIL HFA;VENTOLIN HFA) 108 (90 Base) MCG/ACT inhaler on the day of surgery and bring to the hospital.  Follow recommendations from Cardiologist, Pulmonologist or PCP regarding stopping Aspirin, Coumadin, Plavix, Eliquis, Pradaxa, or Pletal. Stop aspirin 81 mg beginning 04/23/21.  One week prior to surgery: Stop Anti-inflammatories (NSAIDS) such as Advil, Aleve, Ibuprofen, Motrin, Naproxen, Naprosyn and Aspirin based products such as Excedrin, Goodys Powder, BC Powder.  Stop ANY OVER THE COUNTER supplements until after surgery beginning 04/25/21.  You may continue to take Tylenol if needed for pain up until the day before surgery.  No Alcohol for 24 hours before or after surgery.  No Smoking including e-cigarettes for 24 hours prior to surgery.  No chewable tobacco products for at least 6  hours prior to surgery.  No nicotine patches on the day of surgery.  Do not use any "recreational" drugs for at least a week prior to your surgery.  Please be advised that the combination of cocaine and anesthesia may have negative outcomes, up to and including death. If you test positive for cocaine, your surgery will be cancelled.  On the morning of surgery brush your teeth with toothpaste and water, you may rinse your mouth with mouthwash if you wish. Do not swallow any toothpaste or mouthwash.  Do not wear jewelry, make-up, hairpins, clips or nail polish.  Do not wear lotions, powders, or perfumes.   Do not shave body from the neck down 48 hours prior to surgery just in case you cut yourself which could leave a site for infection.  Also, freshly shaved skin may become irritated if using the CHG soap.  Contact lenses, hearing aids and dentures may not be worn into surgery.  Do not bring valuables to the hospital. [facility] is not responsible for any missing/lost belongings or valuables.   Use CHG Soap or wipes as directed on instruction sheet.  Bring your C-PAP to the hospital with you in case you may have to spend the night.   Notify your doctor if there is any change in your medical condition (cold, fever, infection).  Wear comfortable clothing (specific to your surgery type) to the hospital.  After surgery, you can help prevent lung complications by doing breathing exercises.  Take deep breaths and cough every 1-2 hours. Your doctor may order a device called an Incentive  Spirometer to help you take deep breaths. When coughing or sneezing, hold a pillow firmly against your incision with both hands. This is called "splinting." Doing this helps protect your incision. It also decreases belly discomfort.  If you are being admitted to the hospital overnight, leave your suitcase in the car. After surgery it may be brought to your room.  If you are being discharged the day of  surgery, you will not be allowed to drive home. You will need a responsible adult (18 years or older) to drive you home and stay with you that night.   If you are taking public transportation, you will need to have a responsible adult (18 years or older) with you. Please confirm with your physician that it is acceptable to use public transportation.   Please call the [facility] at [phone] if you have any questions about these instructions.  Surgery Visitation Policy:  Patients undergoing a surgery or procedure may have one family member or support person with them as long as that person is not COVID-19 positive or experiencing its symptoms.  That person may remain in the waiting area during the procedure.  Inpatient Visitation:    Visiting hours are 7 a.m. to 8 p.m. Inpatients will be allowed two visitors daily. The visitors may change each day during the patient's stay. No visitors under the age of 24. Any visitor under the age of 54 must be accompanied by an adult. The visitor must pass COVID-19 screenings, use hand sanitizer when entering and exiting the patient's room and wear a mask at all times, including in the patient's room. Patients must also wear a mask when staff or their visitor are in the room. Masking is required regardless of vaccination status.
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
## 2021-04-26 ENCOUNTER — Other Ambulatory Visit
Admission: RE | Admit: 2021-04-26 | Discharge: 2021-04-26 | Disposition: A | Discharge status: Home / Self Care | Payer: Medicare Other | Source: Ambulatory Visit | Attending: General Surgery | Admitting: General Surgery

## 2021-04-26 DIAGNOSIS — Z01812 Encounter for preprocedural laboratory examination: Secondary | ICD-10-CM | POA: Insufficient documentation

## 2021-04-26 DIAGNOSIS — Z20822 Contact with and (suspected) exposure to covid-19: Secondary | ICD-10-CM | POA: Diagnosis not present

## 2021-04-26 LAB — SARS CORONAVIRUS 2 (TAT 6-24 HRS): SARS Coronavirus 2: NEGATIVE

## 2021-04-28 MED ORDER — LACTATED RINGERS IV SOLN: INTRAVENOUS | Status: DC

## 2021-04-28 MED ORDER — BUPIVACAINE LIPOSOME 1.3 % IJ SUSP: 20.0000 mL | Freq: Once | INTRAMUSCULAR | Status: DC

## 2021-04-28 MED ORDER — CHLORHEXIDINE GLUCONATE 0.12 % MT SOLN
15.0000 mL | Freq: Once | OROMUCOSAL | Status: AC
  Administered 2021-04-29: 15 mL via OROMUCOSAL

## 2021-04-28 MED ORDER — ORAL CARE MOUTH RINSE: 15.0000 mL | Freq: Once | OROMUCOSAL | Status: AC

## 2021-04-28 MED ORDER — CHLORHEXIDINE GLUCONATE CLOTH 2 % EX PADS
6.0000 | MEDICATED_PAD | Freq: Once | CUTANEOUS | Status: AC
  Administered 2021-04-29: 6 via TOPICAL

## 2021-04-28 MED ORDER — CEFAZOLIN SODIUM-DEXTROSE 2-4 GM/100ML-% IV SOLN
2.0000 g | INTRAVENOUS | Status: AC
  Administered 2021-04-29: 2 g via INTRAVENOUS

## 2021-04-28 MED ORDER — ACETAMINOPHEN 500 MG PO TABS
1000.0000 mg | ORAL_TABLET | ORAL | Status: AC
  Administered 2021-04-29: 1000 mg via ORAL

## 2021-04-29 ENCOUNTER — Encounter
Admission: RE | Disposition: A | Discharge status: Home / Self Care | Payer: Self-pay | Source: Home / Self Care | Attending: General Surgery

## 2021-04-29 ENCOUNTER — Observation Stay
Admission: RE | Admit: 2021-04-29 | Discharge: 2021-04-30 | Disposition: A | Discharge status: Home / Self Care | Payer: Medicare Other | Attending: General Surgery | Admitting: General Surgery

## 2021-04-29 ENCOUNTER — Ambulatory Visit: Payer: Medicare Other | Admitting: Urgent Care

## 2021-04-29 ENCOUNTER — Other Ambulatory Visit: Payer: Self-pay

## 2021-04-29 ENCOUNTER — Encounter: Payer: Self-pay | Admitting: General Surgery

## 2021-04-29 DIAGNOSIS — Z87891 Personal history of nicotine dependence: Secondary | ICD-10-CM | POA: Diagnosis not present

## 2021-04-29 DIAGNOSIS — Z8546 Personal history of malignant neoplasm of prostate: Secondary | ICD-10-CM | POA: Insufficient documentation

## 2021-04-29 DIAGNOSIS — Z951 Presence of aortocoronary bypass graft: Secondary | ICD-10-CM | POA: Insufficient documentation

## 2021-04-29 DIAGNOSIS — J449 Chronic obstructive pulmonary disease, unspecified: Secondary | ICD-10-CM | POA: Insufficient documentation

## 2021-04-29 DIAGNOSIS — K4090 Unilateral inguinal hernia, without obstruction or gangrene, not specified as recurrent: Secondary | ICD-10-CM | POA: Diagnosis present

## 2021-04-29 DIAGNOSIS — Z7982 Long term (current) use of aspirin: Secondary | ICD-10-CM | POA: Diagnosis not present

## 2021-04-29 DIAGNOSIS — D176 Benign lipomatous neoplasm of spermatic cord: Secondary | ICD-10-CM | POA: Insufficient documentation

## 2021-04-29 DIAGNOSIS — Z85828 Personal history of other malignant neoplasm of skin: Secondary | ICD-10-CM | POA: Diagnosis not present

## 2021-04-29 DIAGNOSIS — Z79899 Other long term (current) drug therapy: Secondary | ICD-10-CM | POA: Insufficient documentation

## 2021-04-29 DIAGNOSIS — I2510 Atherosclerotic heart disease of native coronary artery without angina pectoris: Secondary | ICD-10-CM | POA: Insufficient documentation

## 2021-04-29 DIAGNOSIS — Z9889 Other specified postprocedural states: Secondary | ICD-10-CM

## 2021-04-29 DIAGNOSIS — N189 Chronic kidney disease, unspecified: Secondary | ICD-10-CM | POA: Insufficient documentation

## 2021-04-29 DIAGNOSIS — I129 Hypertensive chronic kidney disease with stage 1 through stage 4 chronic kidney disease, or unspecified chronic kidney disease: Secondary | ICD-10-CM | POA: Insufficient documentation

## 2021-04-29 DIAGNOSIS — Z8719 Personal history of other diseases of the digestive system: Secondary | ICD-10-CM

## 2021-04-29 HISTORY — DX: Hyperlipidemia, unspecified: E78.5

## 2021-04-29 HISTORY — DX: Diverticulosis of intestine, part unspecified, without perforation or abscess without bleeding: K57.90

## 2021-04-29 HISTORY — DX: Atherosclerosis of aorta: I70.0

## 2021-04-29 HISTORY — DX: Unspecified atrial fibrillation: I48.91

## 2021-04-29 HISTORY — PX: INGUINAL HERNIA REPAIR: SHX194

## 2021-04-29 HISTORY — DX: Obstructive sleep apnea (adult) (pediatric): G47.33

## 2021-04-29 SURGERY — REPAIR, HERNIA, INGUINAL, ADULT
Anesthesia: General | Laterality: Left

## 2021-04-29 MED ORDER — PSYLLIUM 95 % PO PACK
PACK | Freq: Every day | ORAL | Status: DC
  Filled 2021-04-29 (×2): qty 1

## 2021-04-29 MED ORDER — DEXAMETHASONE SODIUM PHOSPHATE 10 MG/ML IJ SOLN
INTRAMUSCULAR | Status: DC | PRN
  Administered 2021-04-29: 5 mg via INTRAVENOUS

## 2021-04-29 MED ORDER — KETOROLAC TROMETHAMINE 15 MG/ML IJ SOLN
15.0000 mg | Freq: Four times a day (QID) | INTRAMUSCULAR | Status: DC | PRN
  Administered 2021-04-29 (×2): 15 mg via INTRAVENOUS
  Filled 2021-04-29: qty 1

## 2021-04-29 MED ORDER — ONDANSETRON 4 MG PO TBDP: 4.0000 mg | ORAL_TABLET | Freq: Four times a day (QID) | ORAL | Status: DC | PRN

## 2021-04-29 MED ORDER — OXYCODONE HCL 5 MG PO TABS
5.0000 mg | ORAL_TABLET | Freq: Four times a day (QID) | ORAL | Status: DC | PRN
  Administered 2021-04-29: 10 mg via ORAL
  Administered 2021-04-29: 5 mg via ORAL
  Administered 2021-04-30: 10 mg via ORAL
  Filled 2021-04-29: qty 1
  Filled 2021-04-29: qty 2

## 2021-04-29 MED ORDER — ONDANSETRON HCL 4 MG/2ML IJ SOLN
4.0000 mg | Freq: Four times a day (QID) | INTRAMUSCULAR | Status: DC | PRN
  Administered 2021-04-29: 4 mg via INTRAVENOUS
  Filled 2021-04-29 (×2): qty 2

## 2021-04-29 MED ORDER — UMECLIDINIUM BROMIDE 62.5 MCG/INH IN AEPB
1.0000 | INHALATION_SPRAY | Freq: Every day | RESPIRATORY_TRACT | Status: DC
  Filled 2021-04-29: qty 7

## 2021-04-29 MED ORDER — ARFORMOTEROL TARTRATE 15 MCG/2ML IN NEBU
15.0000 ug | INHALATION_SOLUTION | Freq: Two times a day (BID) | RESPIRATORY_TRACT | Status: DC
  Administered 2021-04-29 – 2021-04-30 (×2): 15 ug via RESPIRATORY_TRACT
  Filled 2021-04-29 (×4): qty 2

## 2021-04-29 MED ORDER — ACETAMINOPHEN 500 MG PO TABS
ORAL_TABLET | ORAL | Status: AC
  Filled 2021-04-29: qty 2

## 2021-04-29 MED ORDER — TORSEMIDE 20 MG PO TABS
40.0000 mg | ORAL_TABLET | Freq: Every morning | ORAL | Status: DC
  Administered 2021-04-30: 40 mg via ORAL
  Filled 2021-04-29: qty 2

## 2021-04-29 MED ORDER — DEXAMETHASONE SODIUM PHOSPHATE 10 MG/ML IJ SOLN
INTRAMUSCULAR | Status: AC
  Filled 2021-04-29: qty 1

## 2021-04-29 MED ORDER — POLYETHYLENE GLYCOL 3350 17 G PO PACK: 17.0000 g | PACK | Freq: Every day | ORAL | Status: DC | PRN

## 2021-04-29 MED ORDER — LIDOCAINE-EPINEPHRINE 1 %-1:100000 IJ SOLN
INTRAMUSCULAR | Status: AC
  Filled 2021-04-29: qty 1

## 2021-04-29 MED ORDER — FLUTICASONE PROPIONATE 50 MCG/ACT NA SUSP
2.0000 | Freq: Every day | NASAL | Status: DC
  Administered 2021-04-29: 2 via NASAL
  Filled 2021-04-29: qty 16

## 2021-04-29 MED ORDER — KETOROLAC TROMETHAMINE 15 MG/ML IJ SOLN
INTRAMUSCULAR | Status: AC
  Filled 2021-04-29: qty 1

## 2021-04-29 MED ORDER — BUPIVACAINE HCL (PF) 0.25 % IJ SOLN
INTRAMUSCULAR | Status: AC
  Filled 2021-04-29: qty 30

## 2021-04-29 MED ORDER — GABAPENTIN 300 MG PO CAPS
300.0000 mg | ORAL_CAPSULE | Freq: Every day | ORAL | Status: DC
  Administered 2021-04-29: 300 mg via ORAL
  Filled 2021-04-29: qty 1

## 2021-04-29 MED ORDER — IBUPROFEN 600 MG PO TABS
600.0000 mg | ORAL_TABLET | Freq: Four times a day (QID) | ORAL | Status: DC | PRN
  Filled 2021-04-29 (×2): qty 1

## 2021-04-29 MED ORDER — LIDOCAINE-EPINEPHRINE 1 %-1:100000 IJ SOLN
INTRAMUSCULAR | Status: DC | PRN
  Administered 2021-04-29: 12 mL via INTRAMUSCULAR

## 2021-04-29 MED ORDER — NITROGLYCERIN 0.4 MG SL SUBL: 0.4000 mg | SUBLINGUAL_TABLET | SUBLINGUAL | Status: DC | PRN

## 2021-04-29 MED ORDER — ATORVASTATIN CALCIUM 20 MG PO TABS
40.0000 mg | ORAL_TABLET | Freq: Every day | ORAL | Status: DC
  Administered 2021-04-29: 40 mg via ORAL
  Filled 2021-04-29: qty 2

## 2021-04-29 MED ORDER — BUPIVACAINE LIPOSOME 1.3 % IJ SUSP
INTRAMUSCULAR | Status: DC | PRN
  Administered 2021-04-29: 20 mL

## 2021-04-29 MED ORDER — FENTANYL CITRATE (PF) 100 MCG/2ML IJ SOLN
25.0000 ug | INTRAMUSCULAR | Status: DC | PRN
  Administered 2021-04-29: 25 ug via INTRAVENOUS

## 2021-04-29 MED ORDER — HYDROCORTISONE ACETATE 25 MG RE SUPP
25.0000 mg | Freq: Every day | RECTAL | Status: DC | PRN
  Filled 2021-04-29: qty 1

## 2021-04-29 MED ORDER — SENNOSIDES-DOCUSATE SODIUM 8.6-50 MG PO TABS
2.0000 | ORAL_TABLET | Freq: Two times a day (BID) | ORAL | Status: DC
  Administered 2021-04-29 – 2021-04-30 (×2): 2 via ORAL
  Filled 2021-04-29 (×2): qty 2

## 2021-04-29 MED ORDER — TORSEMIDE 20 MG PO TABS
20.0000 mg | ORAL_TABLET | Freq: Every day | ORAL | Status: DC
  Filled 2021-04-29 (×2): qty 1

## 2021-04-29 MED ORDER — CEFAZOLIN SODIUM-DEXTROSE 2-4 GM/100ML-% IV SOLN
INTRAVENOUS | Status: AC
  Filled 2021-04-29: qty 100

## 2021-04-29 MED ORDER — SODIUM CHLORIDE 0.9 % IV SOLN
INTRAVENOUS | Status: DC | PRN
  Administered 2021-04-29: 35 ug/min via INTRAVENOUS
  Administered 2021-04-29: 25 ug/min via INTRAVENOUS

## 2021-04-29 MED ORDER — PROMETHAZINE HCL 25 MG PO TABS: 12.5000 mg | ORAL_TABLET | Freq: Four times a day (QID) | ORAL | Status: DC | PRN

## 2021-04-29 MED ORDER — ETOMIDATE 2 MG/ML IV SOLN
INTRAVENOUS | Status: AC
  Filled 2021-04-29: qty 10

## 2021-04-29 MED ORDER — PHENYLEPHRINE HCL-NACL 20-0.9 MG/250ML-% IV SOLN: INTRAVENOUS | Status: DC | PRN

## 2021-04-29 MED ORDER — ALBUTEROL SULFATE (2.5 MG/3ML) 0.083% IN NEBU: 2.5000 mg | INHALATION_SOLUTION | RESPIRATORY_TRACT | Status: DC | PRN

## 2021-04-29 MED ORDER — LIDOCAINE HCL (PF) 2 % IJ SOLN
INTRAMUSCULAR | Status: AC
  Filled 2021-04-29: qty 10

## 2021-04-29 MED ORDER — ALBUTEROL SULFATE HFA 108 (90 BASE) MCG/ACT IN AERS: 2.0000 | INHALATION_SPRAY | RESPIRATORY_TRACT | Status: DC | PRN

## 2021-04-29 MED ORDER — ONDANSETRON HCL 4 MG/2ML IJ SOLN
INTRAMUSCULAR | Status: AC
  Filled 2021-04-29: qty 2

## 2021-04-29 MED ORDER — PANTOPRAZOLE SODIUM 40 MG PO TBEC
40.0000 mg | DELAYED_RELEASE_TABLET | Freq: Every day | ORAL | Status: DC
  Administered 2021-04-30: 40 mg via ORAL
  Filled 2021-04-29: qty 1

## 2021-04-29 MED ORDER — PHENYLEPHRINE HCL (PRESSORS) 10 MG/ML IV SOLN
INTRAVENOUS | Status: AC
  Filled 2021-04-29: qty 1

## 2021-04-29 MED ORDER — ISOSORBIDE MONONITRATE ER 30 MG PO TB24: 60.0000 mg | ORAL_TABLET | Freq: Every morning | ORAL | Status: DC

## 2021-04-29 MED ORDER — POTASSIUM CHLORIDE CRYS ER 20 MEQ PO TBCR
20.0000 meq | EXTENDED_RELEASE_TABLET | Freq: Every day | ORAL | Status: DC
  Administered 2021-04-29 – 2021-04-30 (×2): 20 meq via ORAL
  Filled 2021-04-29 (×2): qty 1

## 2021-04-29 MED ORDER — DEXTROSE IN LACTATED RINGERS 5 % IV SOLN: INTRAVENOUS | Status: DC

## 2021-04-29 MED ORDER — ROCURONIUM BROMIDE 100 MG/10ML IV SOLN
INTRAVENOUS | Status: DC | PRN
  Administered 2021-04-29: 50 mg via INTRAVENOUS
  Administered 2021-04-29: 20 mg via INTRAVENOUS

## 2021-04-29 MED ORDER — MAGNESIUM OXIDE -MG SUPPLEMENT 400 (240 MG) MG PO TABS
400.0000 mg | ORAL_TABLET | Freq: Two times a day (BID) | ORAL | Status: DC
  Administered 2021-04-29 – 2021-04-30 (×2): 400 mg via ORAL
  Filled 2021-04-29 (×2): qty 2
  Filled 2021-04-29 (×2): qty 1

## 2021-04-29 MED ORDER — TRAMADOL HCL 50 MG PO TABS: 50.0000 mg | ORAL_TABLET | Freq: Four times a day (QID) | ORAL | Status: DC | PRN

## 2021-04-29 MED ORDER — LORATADINE 10 MG PO TABS
10.0000 mg | ORAL_TABLET | Freq: Every day | ORAL | Status: DC
  Filled 2021-04-29: qty 1

## 2021-04-29 MED ORDER — FENTANYL CITRATE (PF) 100 MCG/2ML IJ SOLN
INTRAMUSCULAR | Status: AC
  Filled 2021-04-29: qty 2

## 2021-04-29 MED ORDER — VITAMIN D3 25 MCG (1000 UNIT) PO TABS
3000.0000 [IU] | ORAL_TABLET | Freq: Every day | ORAL | Status: DC
  Filled 2021-04-29 (×2): qty 3

## 2021-04-29 MED ORDER — SIMETHICONE 80 MG PO CHEW: 40.0000 mg | CHEWABLE_TABLET | Freq: Four times a day (QID) | ORAL | Status: DC | PRN

## 2021-04-29 MED ORDER — HYDROCODONE-ACETAMINOPHEN 7.5-325 MG PO TABS: 1.0000 | ORAL_TABLET | Freq: Once | ORAL | Status: DC | PRN

## 2021-04-29 MED ORDER — VITAMIN B-12 1000 MCG PO TABS: 1000.0000 ug | ORAL_TABLET | Freq: Every day | ORAL | Status: DC

## 2021-04-29 MED ORDER — BUPIVACAINE LIPOSOME 1.3 % IJ SUSP
INTRAMUSCULAR | Status: AC
  Filled 2021-04-29: qty 20

## 2021-04-29 MED ORDER — ASPIRIN EC 81 MG PO TBEC
81.0000 mg | DELAYED_RELEASE_TABLET | Freq: Every day | ORAL | Status: DC
  Administered 2021-04-29: 81 mg via ORAL
  Filled 2021-04-29: qty 1

## 2021-04-29 MED ORDER — GLYCOPYRROLATE 0.2 MG/ML IJ SOLN
INTRAMUSCULAR | Status: AC
  Filled 2021-04-29: qty 1

## 2021-04-29 MED ORDER — CALCIUM CARBONATE-VITAMIN D 500-200 MG-UNIT PO TABS
1.0000 | ORAL_TABLET | Freq: Two times a day (BID) | ORAL | Status: DC
  Administered 2021-04-29 – 2021-04-30 (×2): 1 via ORAL
  Filled 2021-04-29 (×2): qty 1

## 2021-04-29 MED ORDER — PROPOFOL 10 MG/ML IV BOLUS
INTRAVENOUS | Status: AC
  Filled 2021-04-29: qty 40

## 2021-04-29 MED ORDER — KETAMINE HCL 50 MG/ML IJ SOLN
INTRAMUSCULAR | Status: AC
  Filled 2021-04-29: qty 1

## 2021-04-29 MED ORDER — SUCRALFATE 1 G PO TABS
1.0000 g | ORAL_TABLET | Freq: Three times a day (TID) | ORAL | Status: DC
  Administered 2021-04-29 – 2021-04-30 (×4): 1 g via ORAL
  Filled 2021-04-29 (×4): qty 1

## 2021-04-29 MED ORDER — METOPROLOL TARTRATE 25 MG PO TABS
25.0000 mg | ORAL_TABLET | Freq: Two times a day (BID) | ORAL | Status: DC
  Administered 2021-04-29 – 2021-04-30 (×2): 25 mg via ORAL
  Filled 2021-04-29 (×2): qty 1

## 2021-04-29 MED ORDER — FENTANYL CITRATE (PF) 100 MCG/2ML IJ SOLN
INTRAMUSCULAR | Status: AC
  Administered 2021-04-29: 25 ug via INTRAVENOUS
  Filled 2021-04-29: qty 2

## 2021-04-29 MED ORDER — SODIUM CHLORIDE 0.9 % IV SOLN: INTRAVENOUS | Status: DC | PRN

## 2021-04-29 MED ORDER — OXYCODONE HCL 5 MG PO TABS
ORAL_TABLET | ORAL | Status: AC
  Filled 2021-04-29: qty 2

## 2021-04-29 MED ORDER — ONDANSETRON HCL 4 MG/2ML IJ SOLN
INTRAMUSCULAR | Status: DC | PRN
  Administered 2021-04-29: 4 mg via INTRAVENOUS

## 2021-04-29 MED ORDER — TORSEMIDE 20 MG PO TABS: 20.0000 mg | ORAL_TABLET | ORAL | Status: DC

## 2021-04-29 MED ORDER — PROPOFOL 10 MG/ML IV BOLUS
INTRAVENOUS | Status: DC | PRN
  Administered 2021-04-29: 40 mg via INTRAVENOUS

## 2021-04-29 MED ORDER — KETAMINE HCL 50 MG/ML IJ SOLN
INTRAMUSCULAR | Status: DC | PRN
  Administered 2021-04-29: 25 mg via INTRAMUSCULAR
  Administered 2021-04-29: 50 mg via INTRAMUSCULAR

## 2021-04-29 MED ORDER — PHENYLEPHRINE HCL (PRESSORS) 10 MG/ML IV SOLN
INTRAVENOUS | Status: DC | PRN
  Administered 2021-04-29 (×2): 100 ug via INTRAVENOUS

## 2021-04-29 MED ORDER — SUGAMMADEX SODIUM 200 MG/2ML IV SOLN
INTRAVENOUS | Status: DC | PRN
  Administered 2021-04-29: 140.8 mg via INTRAVENOUS

## 2021-04-29 MED ORDER — CHLORHEXIDINE GLUCONATE 0.12 % MT SOLN
OROMUCOSAL | Status: AC
  Filled 2021-04-29: qty 15

## 2021-04-29 SURGICAL SUPPLY — 42 items
BLADE CLIPPER SURG (BLADE) ×2 IMPLANT
CANISTER SUCT 1200ML W/VALVE (MISCELLANEOUS) IMPLANT
CHLORAPREP W/TINT 26 (MISCELLANEOUS) ×2 IMPLANT
DERMABOND ADVANCED (GAUZE/BANDAGES/DRESSINGS) ×1
DERMABOND ADVANCED .7 DNX12 (GAUZE/BANDAGES/DRESSINGS) ×1 IMPLANT
DRAIN PENROSE 5/8X18 LTX STRL (DRAIN) ×2 IMPLANT
DRAPE LAPAROTOMY 77X122 PED (DRAPES) ×2 IMPLANT
ELECT CAUTERY BLADE TIP 2.5 (TIP) ×2
ELECT REM PT RETURN 9FT ADLT (ELECTROSURGICAL) ×2
ELECTRODE CAUTERY BLDE TIP 2.5 (TIP) ×1 IMPLANT
ELECTRODE REM PT RTRN 9FT ADLT (ELECTROSURGICAL) ×1 IMPLANT
GAUZE 4X4 16PLY ~~LOC~~+RFID DBL (SPONGE) ×2 IMPLANT
GLOVE SURG SYN 6.5 ES PF (GLOVE) ×2 IMPLANT
GLOVE SURG UNDER LTX SZ7 (GLOVE) ×4 IMPLANT
GOWN STRL REUS W/ TWL LRG LVL3 (GOWN DISPOSABLE) ×2 IMPLANT
GOWN STRL REUS W/TWL LRG LVL3 (GOWN DISPOSABLE) ×2
KIT TURNOVER KIT A (KITS) ×2 IMPLANT
LABEL OR SOLS (LABEL) ×2 IMPLANT
MANIFOLD NEPTUNE II (INSTRUMENTS) ×2 IMPLANT
MESH HERNIA SYS ULTRAPRO LRG (Mesh General) ×2 IMPLANT
MESH HERNIA ULTRAPRO MED (Mesh General) IMPLANT
MESH PARIETEX PROGRIP LEFT (Mesh General) IMPLANT
MESH PARIETEX PROGRIP RIGHT (Mesh General) IMPLANT
NEEDLE HYPO 22GX1.5 SAFETY (NEEDLE) ×4 IMPLANT
NS IRRIG 500ML POUR BTL (IV SOLUTION) ×2 IMPLANT
PACK BASIN MINOR ARMC (MISCELLANEOUS) ×2 IMPLANT
SPONGE KITTNER 5P (MISCELLANEOUS) ×2 IMPLANT
SPONGE T-LAP 18X18 ~~LOC~~+RFID (SPONGE) ×2 IMPLANT
STRIP CLOSURE SKIN 1/2X4 (GAUZE/BANDAGES/DRESSINGS) ×2 IMPLANT
SUT ETHIBOND 0 MO6 C/R (SUTURE) ×2 IMPLANT
SUT ETHIBOND NAB MO 7 #0 18IN (SUTURE) IMPLANT
SUT MNCRL 4-0 (SUTURE) ×1
SUT MNCRL 4-0 27XMFL (SUTURE) ×1
SUT PROLENE 2 0 SH DA (SUTURE) IMPLANT
SUT VIC AB 2-0 SH 27 (SUTURE) ×1
SUT VIC AB 2-0 SH 27XBRD (SUTURE) ×1 IMPLANT
SUT VIC AB 3-0 SH 27 (SUTURE) ×2
SUT VIC AB 3-0 SH 27X BRD (SUTURE) ×2 IMPLANT
SUTURE MNCRL 4-0 27XMF (SUTURE) ×1 IMPLANT
SYR 10ML LL (SYRINGE) ×2 IMPLANT
SYR 20ML LL LF (SYRINGE) ×2 IMPLANT
SYR BULB IRRIG 60ML STRL (SYRINGE) ×2 IMPLANT

## 2021-04-29 NOTE — Anesthesia Postprocedure Evaluation (Signed)
Anesthesia Post Note  Patient: Justin [name]  Procedure(s) Performed: HERNIA REPAIR INGUINAL ADULT, open WITH MESH (Left)  Patient location during evaluation: PACU Anesthesia Type: General Level of consciousness: awake and alert Pain management: pain level controlled Vital Signs Assessment: post-procedure vital signs reviewed and stable Respiratory status: spontaneous breathing, nonlabored ventilation, respiratory function stable and patient connected to nasal cannula oxygen Cardiovascular status: blood pressure returned to baseline and stable Postop Assessment: no apparent nausea or vomiting Anesthetic complications: no   No notable events documented.   Last Vitals:  Vitals:   04/29/21 0951 04/29/21 1000  BP:  107/68  Pulse: 79 80  Resp: 14 18  Temp:  (!) 36.3 C  SpO2: 94% 94%    Last Pain:  Vitals:   04/29/21 1000  TempSrc:   PainSc: 5                  Taisley Mordan

## 2021-04-29 NOTE — Transfer of Care (Signed)
Immediate Anesthesia Transfer of Care Note  Patient: Justin [name]  Procedure(s) Performed: HERNIA REPAIR INGUINAL ADULT, open WITH MESH (Left)  Patient Location: PACU  Anesthesia Type:General  Level of Consciousness: awake, alert  and oriented  Airway & Oxygen Therapy: Patient Spontanous Breathing and Patient connected to face mask oxygen  Post-op Assessment: Report given to RN and Post -op Vital signs reviewed and stable  Post vital signs: Reviewed and stable  Last Vitals:  Vitals Value Taken Time  BP 108/72 04/29/21 0924  Temp    Pulse 81 04/29/21 0929  Resp 21 04/29/21 0929  SpO2 96 % 04/29/21 0929  Vitals shown include unvalidated device data.  Last Pain:  Vitals:   04/29/21 0628  TempSrc: Oral         Complications: No notable events documented.

## 2021-04-29 NOTE — Op Note (Signed)
Hernia, Open, Procedure Note  Indications: The patient presented with a history of a left, reducible hernia.    Pre-operative Diagnosis: left reducible  Post-operative Diagnosis: same, pantaloon with direct sliding component  Surgeon: Fredirick Maudlin   Assistants: none  Anesthesia: General endotracheal anesthesia  ASA Class: 3  Procedure Details  The patient was seen again in the Holding Room. The risks, benefits, complications, treatment options, and expected outcomes were discussed with the patient. The possibilities of reaction to medication, pulmonary aspiration, perforation of viscus, bleeding, recurrent infection, the need for additional procedures, and development of a complication requiring transfusion or further operation were discussed with the patient and/or family. There was concurrence with the proposed plan, and informed consent was obtained. The site of surgery was properly noted/marked. The patient was taken to the Operating Room, identified as Justin [name], and the procedure verified as hernia repair. A Time Out was held and the above information confirmed.  The patient was placed in the supine position and underwent induction of anesthesia, the lower abdomen and groin was prepped and draped in the standard fashion. A 1:1 mixture of 0.25% bupivicaine and 1% lidocaine with epinephrine was used to anesthetize the skin over the mid-portion of the inguinal canal. A transverse incision was made. Dissection was carried through the soft tissue to expose the inguinal canal and inguinal ligament along its lower edge. The external oblique fascia was split along the course of its fibers, exposing the inguinal canal. The cord and nerve were looped using a Penrose drain and reflected out of the field.  There was substantial scar tissue present along the median aspect, secondary to prior open radical retropubic prostatectomy.  There appeared to be to separate defects, a sliding direct hernia  and an indirect hernia containing a cord lipoma.  I addressed the indirect hernia first.  The sac was dissected away from the cord structures up to the internal ring.  It was then suture-ligated and excised.  The direct defect was then addressed.  In the course of the dissection, I did end up violating the peritoneum.  This was closed with running 3-0 Vicryl.  I then selected a large Ultra Pro mesh and trimmed it to an appropriate size.  I secured it to the pubic tubercle with a 0 Ethibond suture.  Interrupted 0 Ethibond was then used to secure the mesh to the shelving edge of the inguinal ligament laterally and the conjoined tendon medially.  The disc was placed into the direct defect and laid flat without tension or buckling.  The mesh was split to allow passage of the cord and nerve into the canal without entrapment. The contents were then returned to canal and the external oblique fascia was then closed in a continuous fashion using 2-0 Vicryl suture taking care not to cause entrapment of the nerve.  Liposomal bupivacaine was then injected along the fascial planes.  Scarpa's fascia was closed with running 3-0 Vicryl.  The deep dermis was closed with interrupted 3-0 Vicryl and the skin was closed with running subcuticular Monocryl.  The skin was cleaned.  Dermabond and Steri-Strips were applied.  The patient was awakened, extubated, and taken to the postanesthesia care unit in good condition.  Instrument, sponge, and needle counts were correct prior to closure and at the conclusion of the case.  Findings: Hernia as above  Estimated Blood Loss: Minimal         Drains: None         Total IV Fluids:  See anesthesia record         Specimens: None               Complications: None immediately apparent         Disposition:  Stable to the postanesthesia care unit with subsequent admission for overnight observation due to his advanced age and medical comorbidities         Condition: stable

## 2021-04-29 NOTE — Interval H&P Note (Signed)
History and Physical Interval Note:  04/29/2021 7:21 AM  Justin [name]  has presented today for surgery, with the diagnosis of left inguinal hernia.  The various methods of treatment have been discussed with the patient and family. After consideration of risks, benefits and other options for treatment, the patient has consented to  Procedure(s) with comments: [place], open (Left) - Provider requesting 2 hours / 120 minutes for procedure. as a surgical intervention.  The patient's history has been reviewed, patient examined, no change in status, stable for surgery.  I have reviewed the patient's chart and labs.  Questions were answered to the patient's satisfaction.     Fredirick Maudlin

## 2021-04-29 NOTE — Anesthesia Preprocedure Evaluation (Signed)
Anesthesia Evaluation  Patient identified by MRN, date of birth, ID band Patient awake    Reviewed: Allergy & Precautions, NPO status , Patient's Chart, lab work & pertinent test results  History of Anesthesia Complications Negative for: history of anesthetic complications  Airway Mallampati: II  TM Distance: >3 FB Neck ROM: Full    Dental  (+) Partial Lower, Partial Upper, Missing   Pulmonary sleep apnea , COPD, former smoker,    Pulmonary exam normal        Cardiovascular Exercise Tolerance: Good hypertension, + CAD, + Past MI, + Cardiac Stents, + CABG and + Peripheral Vascular Disease  Normal cardiovascular examI     Neuro/Psych    GI/Hepatic GERD  Medicated and Controlled,  Endo/Other    Renal/GU Renal disease     Musculoskeletal  (+) Arthritis , Osteoarthritis,    Abdominal Normal abdominal exam  (+)   Peds  Hematology   Anesthesia Other Findings 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  Reproductive/Obstetrics                             Anesthesia Physical Anesthesia Plan  ASA: 3  Anesthesia Plan: General   Post-op Pain Management:    Induction: Intravenous  PONV Risk Score and Plan: Ondansetron  Airway Management Planned: Oral ETT  Additional Equipment:   Intra-op Plan:   Post-operative Plan: Extubation in OR  Informed Consent: I have reviewed the patients History and Physical, chart, labs and discussed the procedure including the risks, benefits and alternatives for the proposed anesthesia with the patient or authorized representative who has indicated his/her understanding and acceptance.       Plan Discussed with: CRNA  Anesthesia Plan Comments:         Anesthesia Quick Evaluation

## 2021-04-29 NOTE — Anesthesia Procedure Notes (Signed)
Procedure Name: Intubation Date/Time: 04/29/2021 7:38 AM Performed by: Demetrius Charity, CRNA Pre-anesthesia Checklist: Patient identified, Patient being monitored, Timeout performed, Emergency Drugs available and Suction available Patient Re-evaluated:Patient Re-evaluated prior to induction Oxygen Delivery Method: Circle system utilized Preoxygenation: Pre-oxygenation with 100% oxygen Induction Type: IV induction Ventilation: Mask ventilation without difficulty and Oral airway inserted - appropriate to patient size Laryngoscope Size: 3 and McGraph Grade View: Grade I Tube type: Oral Tube size: 7.0 mm Number of attempts: 1 Airway Equipment and Method: Stylet and Video-laryngoscopy Placement Confirmation: ETT inserted through vocal cords under direct vision, positive ETCO2 and breath sounds checked- equal and bilateral Secured at: 21 cm Tube secured with: Tape Dental Injury: Teeth and Oropharynx as per pre-operative assessment

## 2021-04-30 DIAGNOSIS — K4090 Unilateral inguinal hernia, without obstruction or gangrene, not specified as recurrent: Secondary | ICD-10-CM | POA: Diagnosis not present

## 2021-04-30 MED ORDER — TRAMADOL HCL 50 MG PO TABS: 50.0000 mg | ORAL_TABLET | Freq: Four times a day (QID) | ORAL | 0 refills | Status: DC | PRN

## 2021-04-30 MED ORDER — IBUPROFEN 600 MG PO TABS: 600.0000 mg | ORAL_TABLET | Freq: Four times a day (QID) | ORAL | 0 refills | Status: DC | PRN

## 2021-04-30 NOTE — Discharge Summary (Addendum)
[facility] SURGICAL DISCHARGE SUMMARY  Patient ID: Justin [name] MRN: [id number] DOB/AGE: 03-27-1941 80 y.o.  Admit date: 04/29/2021 Discharge date: 04/30/2021  Discharge Diagnoses Patient Active Problem List   Diagnosis Date Noted   S/P inguinal hernia repair 04/29/2021    Consultants None  Procedures 04/29/2021:  Left inguinal hernia repair  HPI: Justin [name] is a 80 y.o. male with a history of left inguinal hernia who presents to [facility] on 08/08 for repair with Dr Celine Ahr.   Hospital Course: Informed consent was obtained and documented, and patient underwent uneventful left inguinal hernia repair (Dr Celine Ahr, 04/29/2021).  Post-operatively, patient did well and advancement of patient's diet and ambulation were well-tolerated. The remainder of patient's hospital course was essentially unremarkable, and discharge planning was initiated accordingly with patient safely able to be discharged home with appropriate discharge instructions, pain control, and outpatient follow-up after all of his questions were answered to his expressed satisfaction.   Discharge Condition: Good    Physical Examination:  Constitutional: Well appearing male, NAD Pulmonary: Normal effort, no respiratory distress Gastrointestinal: Soft, mild incisional soreness, non-distended, no rebound/guarding Skin: Left inguinal incision is CDI with steri-strips, no erythema or drainage    Allergies as of 04/30/2021       Reactions   Hydrochlorothiazide W-triamterene Nausea Only        Medication List     TAKE these medications    acetaminophen 325 MG tablet Commonly known as: TYLENOL Take 975 mg by mouth in the morning, at noon, and at bedtime.   albuterol 108 (90 Base) MCG/ACT inhaler Commonly known as: VENTOLIN HFA Inhale 2 puffs into the lungs every 4 (four) hours as needed for wheezing or shortness of breath.   aspirin EC 81 MG tablet Take 81 mg by mouth daily at 12 noon.    atorvastatin 80 MG tablet Commonly known as: LIPITOR Take 40 mg by mouth daily at 12 noon.   BENEFIBER DRINK MIX PO Take 1 Dose by mouth daily.   Calcium Carbonate-Vitamin D3 600-400 MG-UNIT Tabs Take 1 tablet by mouth in the morning and at bedtime.   cholecalciferol 25 MCG (1000 UNIT) tablet Commonly known as: VITAMIN D Take 3,000 Units by mouth daily at 12 noon.   Emergen-C Vitamin C Pack Take 1 packet by mouth daily.   fexofenadine 180 MG tablet Commonly known as: ALLEGRA Take 180 mg by mouth daily before breakfast.   Fish Oil 1000 MG Caps Take 1,000 mg by mouth 2 (two) times daily.   fluticasone 50 MCG/ACT nasal spray Commonly known as: FLONASE Place 2 sprays into both nostrils at bedtime.   gabapentin 300 MG capsule Commonly known as: NEURONTIN Take 300 mg by mouth at bedtime.   hydrocortisone 25 MG suppository Commonly known as: ANUSOL-HC Place 25 mg rectally daily as needed for hemorrhoids or anal itching.   ibuprofen 600 MG tablet Commonly known as: ADVIL Take 1 tablet (600 mg total) by mouth every 6 (six) hours as needed for mild pain.   isosorbide mononitrate 60 MG 24 hr tablet Commonly known as: IMDUR Take 60 mg by mouth in the morning.   Magnesium 400 MG Caps Take 400 mg by mouth 2 (two) times daily.   metoprolol tartrate 25 MG tablet Commonly known as: LOPRESSOR Take 25 mg by mouth 2 (two) times daily.   niacin 500 MG tablet Take 500 mg by mouth in the morning, at noon, and at bedtime.   nitroGLYCERIN 0.4 MG SL tablet Commonly known  as: NITROSTAT Place 0.4 mg under the tongue every 5 (five) minutes x 3 doses as needed for chest pain. If no relief call MD or go to emergency room.   omeprazole 40 MG capsule Commonly known as: PRILOSEC Take 40 mg by mouth 2 (two) times daily. Before breakfast & mid afternoon   PROBIOTIC ACIDOPHILUS PO Take 2 capsules by mouth daily at 12 noon.   Reclast 5 MG/100ML Soln injection Generic drug: zoledronic  acid Inject 5 mg into the vein once.   senna-docusate 8.6-50 MG tablet Commonly known as: Senokot-S Take 2 tablets by mouth 2 (two) times daily.   sucralfate 1 g tablet Commonly known as: CARAFATE Take 1 g by mouth in the morning and at bedtime. Before breakfast & before lunch   TART CHERRY PO Take 3,000 mg by mouth every evening.   Tiotropium Bromide-Olodaterol 2.5-2.5 MCG/ACT Aers Inhale 2 puffs into the lungs in the morning.   torsemide 20 MG tablet Commonly known as: DEMADEX Take 20-40 mg by mouth See admin instructions. Take 2 tablets (40 mg) by mouth before breakfast & take 1 tablet (20 mg) by mouth at noon.   traMADol 50 MG tablet Commonly known as: ULTRAM Take 1 tablet (50 mg total) by mouth every 6 (six) hours as needed for moderate pain.   vitamin B-12 1000 MCG tablet Commonly known as: CYANOCOBALAMIN Take 1,000 mcg by mouth daily at 12 noon.       ASK your doctor about these medications    polyethylene glycol 17 g packet Commonly known as: MIRALAX / GLYCOLAX Take 17 g by mouth daily as needed for moderate constipation.   potassium chloride SA 20 MEQ tablet Commonly known as: KLOR-CON Take 1 tablet (20 mEq total) by mouth daily.   promethazine 12.5 MG tablet Commonly known as: PHENERGAN Take 12.5 mg by mouth every 6 (six) hours as needed for nausea or vomiting.          Follow-up Information     Tylene Fantasia, PA-C. Schedule an appointment as soon as possible for a visit in 2 week(s).   Specialty: Physician Assistant Why: s/p inguinal hernia repair Contact information: [address] [place] [address] [phone]                  Time spent on discharge management including discussion of hospital course, clinical condition, outpatient instructions, prescriptions, and follow up with the patient and members of the medical team: >30 minutes  -- [name] , PA-C [facility]  04/30/2021, 8:50  AM [phone] M-F: 7am - 4pm  The patient had departed the facility prior to my evaluation. I discussed his care with Mr. Olean Ree and concur with the above documentation.

## 2021-04-30 NOTE — Discharge Instructions (Addendum)
In addition to included general post-operative instructions,  Diet: Resume home diet.   Activity: No heavy lifting >15 pounds (children, pets, laundry, garbage) or strenuous activity for 6 weeks, but light activity and walking are encouraged. Do not drive or drink alcohol if taking narcotic pain medications or having pain that might distract from driving.  Wound care: 2 days after surgery (08/10), you may shower/get incision wet with soapy water and pat dry (do not rub incisions), but no baths or submerging incision underwater until follow-up.   Medications: Resume all home medications except. For mild to moderate pain: acetaminophen (Tylenol) or ibuprofen/naproxen (if no kidney disease). Combining Tylenol with alcohol can substantially increase your risk of causing liver disease. Narcotic pain medications, if prescribed, can be used for severe pain, though may cause nausea, constipation, and drowsiness. Do not combine Tylenol and Percocet (or similar) within a 6 hour period as Percocet (and similar) contain(s) Tylenol. If you do not need the narcotic pain medication, you do not need to fill the prescription.  Call office [phone] / [phone]) at any time if any questions, worsening pain, fevers/chills, bleeding, drainage from incision site, or other concerns.

## 2021-04-30 NOTE — Progress Notes (Signed)
Discharge instructions reviewed with the patient. IV removed. Patient sent out via wheelchair to waiting ride

## 2021-05-13 ENCOUNTER — Encounter: Payer: Self-pay | Admitting: Physician Assistant

## 2021-05-13 ENCOUNTER — Other Ambulatory Visit: Payer: Self-pay

## 2021-05-13 ENCOUNTER — Encounter: Payer: Medicare Other | Admitting: Physician Assistant

## 2021-05-13 ENCOUNTER — Ambulatory Visit (INDEPENDENT_AMBULATORY_CARE_PROVIDER_SITE_OTHER): Payer: Medicare Other | Admitting: Physician Assistant

## 2021-05-13 VITALS — BP 131/91 | HR 47 | Temp 97.9°F | Ht 68.0 in | Wt 159.6 lb

## 2021-05-13 DIAGNOSIS — Z09 Encounter for follow-up examination after completed treatment for conditions other than malignant neoplasm: Secondary | ICD-10-CM

## 2021-05-13 DIAGNOSIS — K4090 Unilateral inguinal hernia, without obstruction or gangrene, not specified as recurrent: Secondary | ICD-10-CM

## 2021-05-13 NOTE — Progress Notes (Signed)
[facility] POST-OP OFFICE VISIT  05/13/2021  HPI: Justin [name] is a 80 y.o. male 14 days s/p left inguinal hernia repair with Dr Celine Ahr.   He has done well since the surgery He reports that he had some significant scrotal swelling, but that has subsided with ice packs and compression Only a few very mild "twinges" of pain. Only using tylenol Initially with constipation but this improved with Miralax No fever, chills, nausea, emesis No issues with incisions No other complaints   Vital signs: BP (!) 131/91   Pulse (!) 47   Temp 97.9 F (36.6 C)   Ht '5\' 8"'$  (1.727 m)   Wt 159 lb 9.6 oz (72.4 kg)   SpO2 97%   BMI 24.27 kg/m    Physical Exam: Constitutional: Well appearing male, NAD Abdomen: Soft, non-tender, non-distended, no rebound/guarding GU: Chaperone present, mild left testicular swelling, both testicles palpable, no evidence of recurrence  Skin: Left inguinal incision is well healed, no erythema or drainage   Assessment/Plan: This is a 80 y.o. male 14 days s/p left inguinal hernia repair   - Pain control prn  - Continue ice packs and compressive/supportive underwear prn  - Reviewed wound care  - Reviewed lifting restrictions; 6 weeks total  - He can rtc on as needed basis   -- [name], PA-C [facility] 05/13/2021, 11:08 AM [phone] M-F: 7am - 4pm

## 2021-05-13 NOTE — Patient Instructions (Addendum)
GENERAL POST-OPERATIVE PATIENT INSTRUCTIONS   WOUND CARE INSTRUCTIONS:Try to keep the wound dry and avoid ointments on the wound unless directed to do so.  If the wound becomes bright red and painful or starts to drain infected material that is not clear, please contact your physician immediately.  If the wound is mildly pink and has a thick firm ridge underneath it, this is normal, and is referred to as a healing ridge.  This will resolve over the next 4-6 weeks.  BATHING: You may shower if you have been informed of this by your surgeon. However, Please do not submerge in a tub, hot tub, or pool until incisions are completely sealed or have been told by your surgeon that you may do so.  DIET:  You may eat any foods that you can tolerate.  It is a good idea to eat a high fiber diet and take in plenty of fluids to prevent constipation.  If you do become constipated you may want to take a mild laxative or take ducolax tablets on a daily basis until your bowel habits are regular.  Constipation can be very uncomfortable, along with straining, after recent surgery.  ACTIVITY: You may want to hug a pillow when coughing and sneezing to add additional support to the surgical area, if you had abdominal or chest surgery, which will decrease pain during these times.  You are encouraged to walk and engage in light activity for the next two weeks.  You should not lift more than 20 pounds for 6 weeks after surgery as it could put you at increased risk for complications.  Twenty pounds is roughly equivalent to a plastic bag of groceries. At that time- Listen to your body when lifting, if you have pain when lifting, stop and then try again in a few days. Soreness after doing exercises or activities of daily living is normal as you get back in to your normal routine.  MEDICATIONS:  Try to take narcotic medications and anti-inflammatory medications, such as tylenol, ibuprofen, naprosyn, etc., with food.  This will  minimize stomach upset from the medication.  Should you develop nausea and vomiting from the pain medication, or develop a rash, please discontinue the medication and contact your physician.  You should not drive, make important decisions, or operate machinery when taking narcotic pain medication.  SUNBLOCK Use sun block to incision area over the next year if this area will be exposed to sun. This helps decrease scarring and will allow you avoid a permanent darkened area over your incision.  QUESTIONS:  Please feel free to call our office if you have any questions, and we will be glad to assist you.

## 2021-07-03 ENCOUNTER — Other Ambulatory Visit: Payer: Self-pay | Admitting: Cardiology

## 2021-07-03 DIAGNOSIS — J849 Interstitial pulmonary disease, unspecified: Secondary | ICD-10-CM

## 2021-07-05 ENCOUNTER — Other Ambulatory Visit: Payer: Self-pay

## 2021-07-05 ENCOUNTER — Ambulatory Visit
Admission: RE | Admit: 2021-07-05 | Discharge: 2021-07-05 | Disposition: A | Discharge status: Home / Self Care | Payer: Medicare Other | Source: Ambulatory Visit | Attending: Cardiology | Admitting: Cardiology

## 2021-07-05 DIAGNOSIS — J849 Interstitial pulmonary disease, unspecified: Secondary | ICD-10-CM | POA: Insufficient documentation

## 2021-07-12 ENCOUNTER — Encounter: Payer: Self-pay | Admitting: General Surgery

## 2021-08-01 ENCOUNTER — Ambulatory Visit (INDEPENDENT_AMBULATORY_CARE_PROVIDER_SITE_OTHER): Payer: Medicare Other | Admitting: Dermatology

## 2021-08-01 ENCOUNTER — Other Ambulatory Visit: Payer: Self-pay

## 2021-08-01 DIAGNOSIS — Z1283 Encounter for screening for malignant neoplasm of skin: Secondary | ICD-10-CM

## 2021-08-01 DIAGNOSIS — D229 Melanocytic nevi, unspecified: Secondary | ICD-10-CM

## 2021-08-01 DIAGNOSIS — D1800 Hemangioma unspecified site: Secondary | ICD-10-CM

## 2021-08-01 DIAGNOSIS — L814 Other melanin hyperpigmentation: Secondary | ICD-10-CM | POA: Diagnosis not present

## 2021-08-01 DIAGNOSIS — L578 Other skin changes due to chronic exposure to nonionizing radiation: Secondary | ICD-10-CM | POA: Diagnosis not present

## 2021-08-01 DIAGNOSIS — L821 Other seborrheic keratosis: Secondary | ICD-10-CM

## 2021-08-01 DIAGNOSIS — L570 Actinic keratosis: Secondary | ICD-10-CM | POA: Diagnosis not present

## 2021-08-01 DIAGNOSIS — Z86007 Personal history of in-situ neoplasm of skin: Secondary | ICD-10-CM | POA: Diagnosis not present

## 2021-08-01 NOTE — Progress Notes (Signed)
Follow-Up Visit   Subjective  Justin [name] is a 80 y.o. male who presents for the following: FBSE (Patient here for full body skin exam and skin cancer screening. Patient with hx of SCCis. Patient does have a few scaly spots at face and one at scalp he picks at and has been treated with LN2 in the past. ).   The following portions of the chart were reviewed this encounter and updated as appropriate:   Tobacco  Allergies  Meds  Problems  Med Hx  Surg Hx  Fam Hx      Review of Systems:  No other skin or systemic complaints except as noted in HPI or Assessment and Plan.  Objective  Well appearing patient in no apparent distress; mood and affect are within normal limits.  A full examination was performed including scalp, head, eyes, ears, nose, lips, neck, chest, axillae, abdomen, back, buttocks, bilateral upper extremities, bilateral lower extremities, hands, feet, fingers, toes, fingernails, and toenails. All findings within normal limits unless otherwise noted below.  Right Hand x 3, vertex scalp x 3, left cheek x 1, upper mid forehead x 1, right preauricular x 1, left forearm x 2 (11) Erythematous thin papules/macules with gritty scale.    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 Recommend Vaseline ointment to treated areas while healing.   Actinic keratoses are precancerous spots that appear secondary to cumulative UV radiation exposure/sun exposure over time. They are chronic with expected duration over 1 year. A portion of actinic keratoses will progress to squamous cell carcinoma of the skin. It is not possible to reliably predict which spots will progress to skin cancer and so treatment is recommended to prevent development  of skin cancer.  Recommend daily broad spectrum sunscreen SPF 30+ to sun-exposed areas, reapply every 2 hours as needed.  Recommend staying in the shade or wearing long sleeves, sun glasses (UVA+UVB protection) and wide brim hats (4-inch brim around the entire circumference of the hat). Patient will call if these do not clear or if he has any new or changing lesions. Discussed option of sooner f/u but patient will monitor at home.   Destruction of lesion - Right Hand x 3, vertex scalp x 3, left cheek x 1, upper mid forehead x 1, right preauricular x 1, left forearm x 2  Destruction method: cryotherapy   Informed consent: discussed and consent obtained   Lesion destroyed using liquid nitrogen: Yes   Cryotherapy cycles:  2 Outcome: patient tolerated procedure well with no complications   Post-procedure details: wound care instructions given    Lentigines - Scattered tan macules - Due to sun exposure - Benign-appearing, observe - Recommend daily broad spectrum sunscreen SPF 30+ to sun-exposed areas, reapply every 2 hours as needed. - Call for any changes  Seborrheic Keratoses - Stuck-on, waxy, tan-brown papules and/or plaques  - Benign-appearing - Discussed benign etiology and prognosis. - Observe - Call for any changes  Melanocytic Nevi - Tan-brown and/or pink-flesh-colored symmetric macules and papules - Benign appearing on exam today - Observation - Call clinic for new or changing moles - Recommend daily use of broad spectrum spf 30+ sunscreen to sun-exposed areas.   Hemangiomas - Red papules - Discussed benign nature -  Observe - Call for any changes  Actinic Damage - Chronic condition, secondary to cumulative UV/sun exposure - diffuse scaly erythematous macules with underlying dyspigmentation - Recommend daily broad spectrum sunscreen SPF 30+ to sun-exposed areas, reapply every 2 hours as needed.  - Staying in the shade or wearing long sleeves, sun glasses (UVA+UVB  protection) and wide brim hats (4-inch brim around the entire circumference of the hat) are also recommended for sun protection.  - Call for new or changing lesions.  Skin cancer screening performed today.  History of Squamous Cell Carcinoma in Situ of the Skin - No evidence of recurrence today at right lateral neck, right preauricular - Recommend regular full body skin exams - Recommend daily broad spectrum sunscreen SPF 30+ to sun-exposed areas, reapply every 2 hours as needed.  - Call if any new or changing lesions are noted between office visits  Return in about 1 year (around 08/01/2022) for TBSE.  Graciella Belton, RMA, am acting as scribe for Forest Gleason, MD .  Documentation: I have reviewed the above documentation for accuracy and completeness, and I agree with the above.  Forest Gleason, MD

## 2021-08-01 NOTE — Patient Instructions (Addendum)
Cryotherapy Aftercare  Wash gently with soap and water everyday.   Apply Vaseline and Band-Aid daily until healed.   Prior to procedure, discussed risks of blister formation, small wound, skin dyspigmentation, or rare scar following cryotherapy. Recommend Vaseline ointment to treated areas while healing.  Melanoma ABCDEs  Melanoma is the most dangerous type of skin cancer, and is the leading cause of death from skin disease.  You are more likely to develop melanoma if you: Have light-colored skin, light-colored eyes, or red or blond hair Spend a lot of time in the sun Tan regularly, either outdoors or in a tanning bed Have had blistering sunburns, especially during childhood Have a close family member who has had a melanoma Have atypical moles or large birthmarks  Early detection of melanoma is key since treatment is typically straightforward and cure rates are extremely high if we catch it early.   The first sign of melanoma is often a change in a mole or a new dark spot.  The ABCDE system is a way of remembering the signs of melanoma.  A for asymmetry:  The two halves do not match. B for border:  The edges of the growth are irregular. C for color:  A mixture of colors are present instead of an even brown color. D for diameter:  Melanomas are usually (but not always) greater than 61mm - the size of a pencil eraser. E for evolution:  The spot keeps changing in size, shape, and color.  Please check your skin once per month between visits. You can use a small mirror in front and a large mirror behind you to keep an eye on the back side or your body.   If you see any new or changing lesions before your next follow-up, please call to schedule a visit.  Please continue daily skin protection including broad spectrum sunscreen SPF 30+ to sun-exposed areas, reapplying every 2 hours as needed when you're outdoors.    If you have any questions or concerns for your doctor, please call our main  line at [phone] and press option 4 to reach your doctor's medical assistant. If no one answers, please leave a voicemail as directed and we will return your call as soon as possible. Messages left after 4 pm will be answered the following business day.   You may also send [place] a message via [place]. We typically respond to MyChart messages within 1-2 business days.  For prescription refills, please ask your pharmacy to contact our office. Our fax number is [phone].  If you have an urgent issue when the clinic is closed that cannot wait until the next business day, you can page your doctor at the number below.    Please note that while we do our best to be available for urgent issues outside of office hours, we are not available 24/7.   If you have an urgent issue and are unable to reach [place], you may choose to seek medical care at your doctor's office, retail clinic, urgent care center, or emergency room.  If you have a medical emergency, please immediately call 911 or go to the emergency department.  Pager Numbers  - Dr. Nehemiah Massed: [phone]  - Dr. Laurence Ferrari: [phone]  - Dr. Nicole Kindred: [phone]  In the event of inclement weather, please call our main line at [phone] for an update on the status of any delays or closures.  Dermatology Medication Tips: Please keep the boxes that topical medications come in in order to help  keep track of the instructions about where and how to use these. Pharmacies typically print the medication instructions only on the boxes and not directly on the medication tubes.   If your medication is too expensive, please contact our office at [phone] option 4 or send [place] a message through [place].   We are unable to tell what your co-pay for medications will be in advance as this is different depending on your insurance coverage. However, we may be able to find a substitute medication at lower cost or fill out paperwork to get insurance to cover a  needed medication.   If a prior authorization is required to get your medication covered by your insurance company, please allow [place] 1-2 business days to complete this process.  Drug prices often vary depending on where the prescription is filled and some pharmacies may offer cheaper prices.  The website www.goodrx.com contains coupons for medications through different pharmacies. The prices here do not account for what the cost may be with help from insurance (it may be cheaper with your insurance), but the website can give you the price if you did not use any insurance.  - You can print the associated coupon and take it with your prescription to the pharmacy.  - You may also stop by our office during regular business hours and pick up a GoodRx coupon card.  - If you need your prescription sent electronically to a different pharmacy, notify our office through [facility] or by phone at [phone] option 4.

## 2021-08-02 ENCOUNTER — Ambulatory Visit: Payer: Self-pay | Admitting: General Surgery

## 2021-08-02 NOTE — H&P (Signed)
PATIENT PROFILE: Justin [name] is a 80 y.o. male who presents to the Clinic for consultation at the request of Justin [name] for evaluation of recurrent left inguinal hernia.  PCP:  Justin Pax, MD  HISTORY OF PRESENT ILLNESS: Justin [name] reports he had a left inguinal hernia repair in August 2022.  He was doing well until last month that he started feeling a recurrent lump in the left groin.  He endorses that he is able to reduce the hernia but is getting more difficult and more painful.  He has been having intermittent partial bowel obstruction that resolved with reducing the hernia.  Aggravating factor is applying pressure to the abdomen.  Alleviating factor is reducing the hernia.  Of note patient has multiple intra-abdominal surgeries including previous midline hernia repair that I have no documentation of the procedure and previous open prostatectomy.  He also had a recent open left inguinal hernia repair with mesh.  He endorses that despite being 80 years old he wants to continue to be active.  He denies any chest pain or shortness of breath.  Patient is able to meet 4 METS.   PROBLEM LIST: Problem List  Date Reviewed: 07/29/2021          Noted   S/P inguinal hernia repair 04/29/2021   Atrial fibrillation with RVR (CMS-HCC) 04/18/2021   Unilateral inguinal hernia, with obstruction, without gangrene, recurrent 04/18/2021   Stage 3a chronic kidney disease (CMS-HCC) 08/22/2020   Basal cell carcinoma of skin 03/20/2020   Thrombocytopenia (CMS-HCC) 08/15/2019   Overview    Platelets 128K, 11/20      Chronic obstructive pulmonary disease (CMS-HCC) 06/06/2019   Essential hypertension 06/06/2019   PAD (peripheral artery disease) (CMS-HCC) 11/10/2018   Overview    Tramadol for feet/ankle pain, allows activity Narcotic agreement 5/21      Medicare annual wellness visit, initial 01/27/2018   Overview    5/19, 5/20, 5/21, 6/22      History of compression fracture of spine 07/30/2017    Overview    Thoracic, BD 2017 normal T12 kyphoplasty, 2021 Reclast at [facility] 11/21      Tubular adenoma 04/07/2017   Overview    7/182, 9/21 x 1 (10 mm)      B12 deficiency 01/27/2017   Overview    2017      Mixed hyperlipidemia 01/28/2016   DDD (degenerative disc disease), lumbar 10/12/2014   History of prostate cancer 07/25/2014   Overview    postop      Urethral valve, 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  With a drug-eluting stent.       GENERAL REVIEW OF SYSTEMS:   General ROS: negative for - chills, fatigue, fever, weight gain or weight loss Allergy and Immunology ROS: negative for - hives  Hematological and Lymphatic ROS: negative for - bleeding problems or bruising, negative for palpable nodes Endocrine ROS: negative for -  heat or cold intolerance, hair changes Respiratory ROS: negative for - cough, shortness of breath or wheezing Cardiovascular ROS: no chest pain or palpitations GI ROS: negative for nausea, vomiting, abdominal pain, diarrhea, constipation Musculoskeletal ROS: negative for - joint swelling or muscle pain Neurological ROS: negative for - confusion, syncope Dermatological ROS: negative for pruritus and rash Psychiatric: negative for anxiety, depression, difficulty sleeping and memory loss  MEDICATIONS: Current Outpatient Medications  Medication Sig Dispense Refill   acetaminophen (TYLENOL) 325 MG tablet Take 1 tablet (325 mg total) by mouth as directed for Pain  3 in morning, 3 tabs at lunch 3 tabs at night     albuterol 90 mcg/actuation inhaler Inhale 2 inhalations into the lungs every 6 (six) hours as needed for Wheezing 3 Inhaler 3   ascorbic acid/multivit-min (EMERGEN-C ORAL) Take by mouth One packet daily     aspirin 81 MG EC tablet Take 81 mg by mouth once daily.     atorvastatin (LIPITOR) 80 MG tablet Take 40 mg by mouth nightly.      BENEFIBER, WHEAT DEXTRIN, ORAL Take by mouth     calcium carbonate-vitamin D3 (CALTRATE 600+D) 600 mg-10 mcg (400 unit) tablet Take 1 tablet by mouth 2 (two) times daily with meals     cholecalciferol (VITAMIN D3) 1,000 unit capsule Take 3,000 Units by mouth once daily        cyanocobalamin (VITAMIN B12) 1000 MCG tablet Take 1,000 mcg by mouth once daily.     fexofenadine (ALLEGRA) 180 MG tablet Take 180 mg by mouth once daily.     fluticasone propionate (FLONASE) 50 mcg/actuation nasal spray Place 2 sprays into both nostrils nightly     gabapentin (NEURONTIN) 300 MG capsule Take 300 mg by mouth once daily        hydrocortisone (ANUSOL-HC) 25 mg suppository Place 25 mg rectally once daily as needed for Hemorrhoids.     isosorbide mononitrate (IMDUR) 60 MG ER tablet Take 60 mg by mouth once daily.     Lactobacillus acidophilus 0.5 mg (100 million cell) Tab Take 2 tablets by mouth once daily     magnesium oxide (MAG-OX) 400 mg tablet Take 400 mg by mouth 2 (two) times daily     metoprolol tartrate (LOPRESSOR) 25 MG tablet Take 1 tablet (25 mg total) by mouth 2 (two) times daily     niacin 500 MG tablet Take by mouth Three times a day     nitroglycerin (NITROSTAT) 0.4 MG SL tablet Place 1 tablet under tongue as needed for chest pain (may repeat every 5 minutes but seek medical help if pain persists after 3 tablets) as needed     omeprazole (PRILOSEC) 40 MG DR capsule Take 40 mg by mouth 2 (two) times daily before meals Daily per patiet. 180 capsule 2   ondansetron (ZOFRAN) 4 MG tablet Take 1 tablet (4 mg total) by mouth  every 8 (eight) hours as needed for Nausea 20 tablet 0   polyethylene glycol (MIRALAX) powder Take 17 g by mouth 2 (two) times daily Mix in 4-8ounces of fluid prior to taking.     potassium chloride (KLOR-CON) 20 MEQ ER tablet Take 1 tablet (20 mEq total) by mouth 3 (three) times daily     sennosides-docusate (SENOKOT-S) 8.6-50 mg tablet Take 2 tablets by mouth 2 (two) times daily.     sour cherry extract (TART CHERRY EXTRACT) 1,000 mg Cap Take 3,000 mg by mouth once daily     tiotropium-olodateroL (STIOLTO RESPIMAT) 2.5-2.5  mcg/actuation inhaler Inhale 2 inhalations into the lungs once daily 12 g 3   TORsemide (DEMADEX) 20 MG tablet Take 1 tablet (20 mg total) by mouth 4 (four) times daily Four daily per patient. 90 tablet 6   zoledronic acid (RECLAST) 5 mg/100 mL injection Inject 5 mg into the vein once Once yearly     blood glucose diagnostic test strip 1 each (1 strip total) 2 (two) times daily Use as instructed. (Patient not taking: Reported on 07/29/2021) 100 each 0   blood glucose meter kit as directed (Patient not taking: Reported on 07/29/2021) 1 each 0   famotidine (PEPCID) 20 MG tablet Take 1 tablet (20 mg total) by mouth 2 (two) times daily (Patient not taking: Reported on 07/23/2021) 60 tablet 11   ibuprofen (MOTRIN) 600 MG tablet  (Patient not taking: Reported on 07/29/2021)     No current facility-administered medications for this visit.    ALLERGIES: Maxzide [triamterene-hydrochlorothiazid]  PAST MEDICAL HISTORY: Past Medical History:  Diagnosis Date   AR (allergic rhinitis)    Atrial fibrillation with RVR (CMS-HCC) 04/18/2021   BPH (benign prostatic hypertrophy)    CAD (coronary artery disease)    Cataract cortical, senile    Chronic obstructive pulmonary disease (CMS-HCC) 06/06/2019   Diverticulosis    Encounter for blood transfusion    Essential hypertension 06/06/2019   GERD (gastroesophageal reflux disease) 1996   Started takiing prilosec now omeprazole 2 daily   H/O  emphysema    Hyperlipidemia 06/17/2011   Myocardial infarction (CMS-HCC) 11/03/15   Nephrolithiasis    OSA (obstructive sleep apnea) 06/17/2011   Osteoarthritis    Osteoporosis 01/21/2014   IV reclast, five compression fxs requiring kyphoplasty   Prostate cancer (CMS-HCC) 2008   Prostatectomy   Reflux esophagitis 01/21/2014   Skin cancer    neck    PAST SURGICAL HISTORY: Past Surgical History:  Procedure Laterality Date   BACK SURGERY  2009, 2011   CARDIAC CATHETERIZATION     CATARACT EXTRACTION     CHOLECYSTECTOMY  1997   COLONOSCOPY  11/27/1999   Tubulovillous Polyp   COLONOSCOPY  01/11/2002   PH Adenomatous Polyp   COLONOSCOPY  07/15/2005   Serrated Adenoma   COLONOSCOPY  07/17/2008   PH Adenomatous Polyps   COLONOSCOPY  10/31/2011   Adenomatous Polyp: CBF 10/2016   COLONOSCOPY  03/27/2017   Adenomatous Polyps: CBF 03/2020   COLONOSCOPY  06/13/2020   Tubular adenomas/PHx CP/No Repeat due to age/TKT   CORONARY ANGIOPLASTY  11/07/2015   CORONARY ARTERY BYPASS GRAFT     EGD  02/13/2011   No repeat per MUS   EGD  03/24/2017   No repeat per RTE   EGD  01/14/2021   Gastritis/No repeat/TKT   FRACTURE SURGERY     compression fractures see back surgery   HERNIA REPAIR  1979   KYPHOPLASTY  05/15/2020   OTHER SURGERY  2021   Back surgery   PROSTATE SURGERY  2008   RETROPUBIC PROSTATECTOMY     Skin cancer removal     SKIN CANCER REMOVED Right 08/01/2019   Right side of neck     FAMILY HISTORY: Family History  Problem Relation Age of Onset   Osteoporosis (Thinning of bones) Mother    Hip fracture Mother    Prostate cancer Father    Asthma Father    Cancer Father    Diabetes type II Sister    Osteoporosis (Thinning of bones) Sister    High blood pressure (Hypertension)  Sister    Myocardial Infarction (Heart attack) Brother    Heart disease Brother    Colon polyps Brother    Coronary Artery Disease (Blocked arteries around heart) Brother    Skin cancer Brother     High blood pressure (Hypertension) Sister      SOCIAL HISTORY: Social History   Socioeconomic History   Marital status: Married  Tobacco Use   Smoking status: Former    Packs/day: 2.00    Years: 35.00    Pack years: 70.00    Types: Cigarettes    Start date: 09/22/1954    Quit date: 05/23/1990    Years since quitting: 31.2   Smokeless tobacco: Never  Vaping Use   Vaping Use: Never used  Substance and Sexual Activity   Alcohol use: Not Currently   Drug use: Never   Sexual activity: Not Currently    Partners: Female    Birth control/protection: None    PHYSICAL EXAM: Vitals:   08/01/21 1411  BP: 133/73  Pulse: 56   Body mass index is 25.09 kg/m. Weight: 74.8 kg (165 lb)   GENERAL: Alert, active, oriented x3  HEENT: Pupils equal reactive to light. Extraocular movements are intact. Sclera clear. Palpebral conjunctiva normal red color.Pharynx clear.  NECK: Supple with no palpable mass and no adenopathy.  LUNGS: Sound clear with no rales rhonchi or wheezes.  HEART: Regular rhythm S1 and S2 without murmur.  ABDOMEN: Soft and depressible, nontender with no palpable mass, no hepatomegaly. Wounds dry and clean.  EXTREMITIES: Well-developed well-nourished symmetrical with no dependent edema.  NEUROLOGICAL: Awake alert oriented, facial expression symmetrical, moving all extremities.  REVIEW OF DATA: I have reviewed the following data today: Office Visit on 07/23/2021  Component Date Value   Glucose 07/23/2021 60 (L)    Sodium 07/23/2021 135 (L)    Potassium 07/23/2021 4.0    Chloride 07/23/2021 98    Carbon Dioxide (CO2) 07/23/2021 31.5    Calcium 07/23/2021 9.4    Urea Nitrogen (BUN) 07/23/2021 18    Creatinine 07/23/2021 1.1    Glomerular Filtration Ra* 07/23/2021 64    BUN/Crea Ratio 07/23/2021 16.4    Anion Gap w/K 07/23/2021 9.5    Magnesium 07/23/2021 2.0   Ancillary Procedure on 06/24/2021  Component Date Value   LV Ejection Fraction (%) 06/24/2021 55     Aortic Valve Stenosis Gr* 06/24/2021 none    Aortic Valve Regurgitati* 06/24/2021 mild    Aortic Valve Max Velocit* 06/24/2021 1.7    Aortic Valve Stenosis Me* 06/24/2021 6.0    Mitral Valve Stenosis Gr* 06/24/2021 none    Mitral Valve Regurgitati* 06/24/2021 mild    Tricuspid Valve Regurgit* 06/24/2021 moderate    Tricuspid Valve Regurgit* 06/24/2021 2.8    LV End Diastolic Diamete* 35/36/1443 4.7    LV End Systolic Diameter* 15/40/0867 2.5    LV Septum Wall Thickness* 06/24/2021 0.96    LV Posterior Wall Thickn* 06/24/2021 1.1    Left Atrium Diameter (cm) 06/24/2021 4.6   Office Visit on 06/04/2021  Component Date Value   Vent Rate (bpm) 06/04/2021 80    PR Interval (msec) 06/04/2021 202    QRS Interval (msec) 06/04/2021 84    QT Interval (msec) 06/04/2021 346    QTc (msec) 06/04/2021 399   Office Visit on 05/30/2021  Component Date Value   Glucose 05/30/2021 151 (H)    Sodium 05/30/2021 137    Potassium 05/30/2021 3.7    Chloride 05/30/2021 98    Carbon Dioxide (  CO2) 05/30/2021 31.3    Calcium 05/30/2021 9.1    Urea Nitrogen (BUN) 05/30/2021 12    Creatinine 05/30/2021 1.0    Glomerular Filtration Ra* 05/30/2021 72    BUN/Crea Ratio 05/30/2021 12.0    Anion Gap w/K 05/30/2021 11.4    Hemoglobin A1C 05/30/2021 5.8 (H)    Average Blood Glucose (C* 05/30/2021 120    WBC (White Blood Cell Co* 05/30/2021 5.6    RBC (Red Blood Cell Coun* 05/30/2021 3.80 (L)    Hemoglobin 05/30/2021 11.8 (L)    Hematocrit 05/30/2021 36.6 (L)    MCV (Mean Corpuscular Vo* 05/30/2021 96.3    MCH (Mean Corpuscular He* 05/30/2021 31.1    MCHC (Mean Corpuscular H* 05/30/2021 32.2    Platelet Count 05/30/2021 160    RDW-CV (Red Cell Distrib* 05/30/2021 14.4    MPV (Mean Platelet Volum* 05/30/2021 10.0    Neutrophils 05/30/2021 3.70    Lymphocytes 05/30/2021 1.07    Monocytes 05/30/2021 0.61    Eosinophils 05/30/2021 0.22    Basophils 05/30/2021 0.03    Neutrophil % 05/30/2021 65.6     Lymphocyte % 05/30/2021 19.0    Monocyte % 05/30/2021 10.8    Eosinophil % 05/30/2021 3.9    Basophil% 05/30/2021 0.5    Immature Granulocyte % 05/30/2021 0.2    Immature Granulocyte Cou* 05/30/2021 0.01   Appointment on 05/16/2021  Component Date Value   Glucose 05/16/2021 54 (L)    Sodium 05/16/2021 131 (L)    Potassium 05/16/2021 3.9    Chloride 05/16/2021 92 (L)    Carbon Dioxide (CO2) 05/16/2021 33.6 (H)    Calcium 05/16/2021 8.7    Urea Nitrogen (BUN) 05/16/2021 11    Creatinine 05/16/2021 0.9    Glomerular Filtration Ra* 05/16/2021 81    BUN/Crea Ratio 05/16/2021 12.2    Anion Gap w/K 05/16/2021 9.3    Thyroid Stimulating Horm* 05/16/2021 1.016      ASSESSMENT: Mr. Stepter is a 80 y.o. male presenting for consultation for left recurrent inguinal hernia.    The patient presents with a symptomatic, reducible recurrent left inguinal hernia. Patient was oriented about the diagnosis of inguinal hernia and its implication. The patient was oriented about the treatment alternatives (observation vs surgical repair). Due to patient symptoms, repair is recommended. Patient oriented about the surgical procedure, the use of mesh and its risk of complications such as: infection, bleeding, injury to vas deference, vasculature and testicle, injury to bowel or bladder, and chronic pain.   Patient was oriented that due to his previous intra-abdominal surgeries and his prostatectomy and his previous open inguinal hernia repair any of the approach to repair this hernia are going to be be very difficult.  He understand that I will try to complete a minimally invasive approach to repair this hernia but if there is complications or a lot of scar tissue from prostatectomy I will need to convert the case to open hernia repair.  He is also high risk of surgery due to his multiple medical comorbidities.  This medical comorbidities seems to be stable.  Regarding his cardiac history, will request cardiac  clearance to hold aspirin for 5 days.  Unilateral recurrent inguinal hernia without obstruction or gangrene [K40.91]  PLAN: 1.  Robotic assisted laparoscopic left recurrent inguinal hernia repair with mesh (32951) vs open left recurrent inguinal hernia (88416) 2.  CBC, CMP done 3.  Avoid taking aspirin 5 days before procedure 4.  Cardiology Clearance 5.  Contact us if has any question  or concern.   Patient verbalized understanding, all questions were answered, and were agreeable with the plan outlined above.   Herbert Pun, MD  Electronically signed by Herbert Pun, MD

## 2021-08-02 NOTE — H&P (View-Only) (Signed)
[name] PROFILE: Justin [name] is a 80 y.o. male who presents to [facility] for consultation at Justin request of Dr. Sabra Heck for evaluation of recurrent left inguinal hernia.  PCP:  Justin Pax, MD  HISTORY OF PRESENT ILLNESS: Justin [name] reports he had a left inguinal hernia repair in August 2022.  He was doing well until last month that he started feeling a recurrent lump in Justin left groin.  He endorses that he is able to reduce Justin hernia but is getting more difficult and more painful.  He has been having intermittent partial bowel obstruction that resolved with reducing Justin hernia.  Aggravating factor is applying pressure to Justin abdomen.  Alleviating factor is reducing Justin hernia.  Of note [name] has multiple intra-abdominal surgeries including previous midline hernia repair that I have no documentation of Justin procedure and previous open prostatectomy.  He also had a recent open left inguinal hernia repair with mesh.  He endorses that despite being 80 years old he wants to continue to be active.  He denies any chest pain or shortness of breath.  [name] is able to meet 4 METS.   PROBLEM LIST: Problem List  Date Reviewed: 07/29/2021          Noted   S/P inguinal hernia repair 04/29/2021   Atrial fibrillation with RVR (CMS-HCC) 04/18/2021   Unilateral inguinal hernia, with obstruction, without gangrene, recurrent 04/18/2021   Stage 3a chronic kidney disease (CMS-HCC) 08/22/2020   Basal cell carcinoma of skin 03/20/2020   Thrombocytopenia (CMS-HCC) 08/15/2019   Overview    Platelets 128K, 11/20      Chronic obstructive pulmonary disease (CMS-HCC) 06/06/2019   Essential hypertension 06/06/2019   PAD (peripheral artery disease) (CMS-HCC) 11/10/2018   Overview    Tramadol for feet/ankle pain, allows activity Narcotic agreement 5/21      Medicare annual wellness visit, initial 01/27/2018   Overview    5/19, 5/20, 5/21, 6/22      History of compression fracture of spine 07/30/2017    Overview    Thoracic, BD 2017 normal T12 kyphoplasty, 2021 Reclast at [facility] 11/21      Tubular adenoma 04/07/2017   Overview    7/182, 9/21 x 1 (10 mm)      B12 deficiency 01/27/2017   Overview    2017      Mixed hyperlipidemia 01/28/2016   DDD (degenerative disc disease), lumbar 10/12/2014   History of prostate cancer 07/25/2014   Overview    postop      Urethral valve, acquired 02/15/2014   Reflux esophagitis 01/21/2014   Osteoporosis 01/21/2014   Overview    [place]eo x5 years, then IV reclast 5 years, last 2017/BD normal at [place], h/o L2 vert compression fractures      OSA (obstructive sleep apnea) 06/17/2011   Overview    BIPAP      Atherosclerotic heart disease of native coronary artery without angina pectoris 06/17/2011   Overview    Overview:  Overview:  S/p CABG 1992; Cath 06/07/2003 EF 66%, 3 grafts OM2, Mid LAD, LPL1  Status post PCI of Justin saphenous vein graft to Justin LPL 1 on February 15.  With a drug-eluting stent.       GENERAL REVIEW OF SYSTEMS:   General ROS: negative for - chills, fatigue, fever, weight gain or weight loss Allergy and Immunology ROS: negative for - hives  Hematological and Lymphatic ROS: negative for - bleeding problems or bruising, negative for palpable nodes Endocrine ROS: negative for -  heat or cold intolerance, hair changes Respiratory ROS: negative for - cough, shortness of breath or wheezing Cardiovascular ROS: no chest pain or palpitations GI ROS: negative for nausea, vomiting, abdominal pain, diarrhea, constipation Musculoskeletal ROS: negative for - joint swelling or muscle pain Neurological ROS: negative for - confusion, syncope Dermatological ROS: negative for pruritus and rash Psychiatric: negative for anxiety, depression, difficulty sleeping and memory loss  MEDICATIONS: Current Outpatient Medications  Medication Sig Dispense Refill   acetaminophen (TYLENOL) 325 MG tablet Take 1 tablet (325 mg total) by mouth as directed for Pain  3 in morning, 3 tabs at lunch 3 tabs at night     albuterol 90 mcg/actuation inhaler Inhale 2 inhalations into Justin lungs every 6 (six) hours as needed for Wheezing 3 Inhaler 3   ascorbic acid/multivit-min (EMERGEN-C ORAL) Take by mouth One packet daily     aspirin 81 MG EC tablet Take 81 mg by mouth once daily.     atorvastatin (LIPITOR) 80 MG tablet Take 40 mg by mouth nightly.      BENEFIBER, WHEAT DEXTRIN, ORAL Take by mouth     calcium carbonate-vitamin D3 (CALTRATE 600+D) 600 mg-10 mcg (400 unit) tablet Take 1 tablet by mouth 2 (two) times daily with meals     cholecalciferol (VITAMIN D3) 1,000 unit capsule Take 3,000 Units by mouth once daily        cyanocobalamin (VITAMIN B12) 1000 MCG tablet Take 1,000 mcg by mouth once daily.     fexofenadine (ALLEGRA) 180 MG tablet Take 180 mg by mouth once daily.     fluticasone propionate (FLONASE) 50 mcg/actuation nasal spray Place 2 sprays into both nostrils nightly     gabapentin (NEURONTIN) 300 MG capsule Take 300 mg by mouth once daily        hydrocortisone (ANUSOL-HC) 25 mg suppository Place 25 mg rectally once daily as needed for Hemorrhoids.     isosorbide mononitrate (IMDUR) 60 MG ER tablet Take 60 mg by mouth once daily.     Lactobacillus acidophilus 0.5 mg (100 million cell) Tab Take 2 tablets by mouth once daily     magnesium oxide (MAG-OX) 400 mg tablet Take 400 mg by mouth 2 (two) times daily     metoprolol tartrate (LOPRESSOR) 25 MG tablet Take 1 tablet (25 mg total) by mouth 2 (two) times daily     niacin 500 MG tablet Take by mouth Three times a day     nitroglycerin (NITROSTAT) 0.4 MG SL tablet Place 1 tablet under tongue as needed for chest pain (may repeat every 5 minutes but seek medical help if pain persists after 3 tablets) as needed     omeprazole (PRILOSEC) 40 MG DR capsule Take 40 mg by mouth 2 (two) times daily before meals Daily per patiet. 180 capsule 2   ondansetron (ZOFRAN) 4 MG tablet Take 1 tablet (4 mg total) by mouth  every 8 (eight) hours as needed for Nausea 20 tablet 0   polyethylene glycol (MIRALAX) powder Take 17 g by mouth 2 (two) times daily Mix in 4-8ounces of fluid prior to taking.     potassium chloride (KLOR-CON) 20 MEQ ER tablet Take 1 tablet (20 mEq total) by mouth 3 (three) times daily     sennosides-docusate (SENOKOT-S) 8.6-50 mg tablet Take 2 tablets by mouth 2 (two) times daily.     sour cherry extract (TART CHERRY EXTRACT) 1,000 mg Cap Take 3,000 mg by mouth once daily     tiotropium-olodateroL (STIOLTO RESPIMAT) 2.5-2.5  mcg/actuation inhaler Inhale 2 inhalations into Justin lungs once daily 12 g 3   TORsemide (DEMADEX) 20 MG tablet Take 1 tablet (20 mg total) by mouth 4 (four) times daily Four daily per [name]. 90 tablet 6   zoledronic acid (RECLAST) 5 mg/100 mL injection Inject 5 mg into Justin vein once Once yearly     blood glucose diagnostic test strip 1 each (1 strip total) 2 (two) times daily Use as instructed. ([name] not taking: Reported on 07/29/2021) 100 each 0   blood glucose meter kit as directed ([name] not taking: Reported on 07/29/2021) 1 each 0   famotidine (PEPCID) 20 MG tablet Take 1 tablet (20 mg total) by mouth 2 (two) times daily ([name] not taking: Reported on 07/23/2021) 60 tablet 11   ibuprofen (MOTRIN) 600 MG tablet  ([name] not taking: Reported on 07/29/2021)     No current facility-administered medications for this visit.    ALLERGIES: Maxzide [triamterene-hydrochlorothiazid]  PAST MEDICAL HISTORY: Past Medical History:  Diagnosis Date   AR (allergic rhinitis)    Atrial fibrillation with RVR (CMS-HCC) 04/18/2021   BPH (benign prostatic hypertrophy)    CAD (coronary artery disease)    Cataract cortical, senile    Chronic obstructive pulmonary disease (CMS-HCC) 06/06/2019   Diverticulosis    Encounter for blood transfusion    Essential hypertension 06/06/2019   GERD (gastroesophageal reflux disease) 1996   Started takiing prilosec now omeprazole 2 daily   H/O  emphysema    Hyperlipidemia 06/17/2011   Myocardial infarction (CMS-HCC) 11/03/15   Nephrolithiasis    OSA (obstructive sleep apnea) 06/17/2011   Osteoarthritis    Osteoporosis 01/21/2014   IV reclast, five compression fxs requiring kyphoplasty   Prostate cancer (CMS-HCC) 2008   Prostatectomy   Reflux esophagitis 01/21/2014   Skin cancer    neck    PAST SURGICAL HISTORY: Past Surgical History:  Procedure Laterality Date   BACK SURGERY  2009, 2011   CARDIAC CATHETERIZATION     CATARACT EXTRACTION     CHOLECYSTECTOMY  1997   COLONOSCOPY  11/27/1999   Tubulovillous Polyp   COLONOSCOPY  01/11/2002   PH Adenomatous Polyp   COLONOSCOPY  07/15/2005   Serrated Adenoma   COLONOSCOPY  07/17/2008   PH Adenomatous Polyps   COLONOSCOPY  10/31/2011   Adenomatous Polyp: CBF 10/2016   COLONOSCOPY  03/27/2017   Adenomatous Polyps: CBF 03/2020   COLONOSCOPY  06/13/2020   Tubular adenomas/PHx CP/No Repeat due to age/TKT   CORONARY ANGIOPLASTY  11/07/2015   CORONARY ARTERY BYPASS GRAFT     EGD  02/13/2011   No repeat per MUS   EGD  03/24/2017   No repeat per RTE   EGD  01/14/2021   Gastritis/No repeat/TKT   FRACTURE SURGERY     compression fractures see back surgery   HERNIA REPAIR  1979   KYPHOPLASTY  05/15/2020   OTHER SURGERY  2021   Back surgery   PROSTATE SURGERY  2008   RETROPUBIC PROSTATECTOMY     Skin cancer removal     SKIN CANCER REMOVED Right 08/01/2019   Right side of neck     FAMILY HISTORY: Family History  Problem Relation Age of Onset   Osteoporosis (Thinning of bones) Mother    Hip fracture Mother    Prostate cancer Father    Asthma Father    Cancer Father    Diabetes type II Sister    Osteoporosis (Thinning of bones) Sister    High blood pressure (Hypertension)  Sister    Myocardial Infarction (Heart attack) Brother    Heart disease Brother    Colon polyps Brother    Coronary Artery Disease (Blocked arteries around heart) Brother    Skin cancer Brother     High blood pressure (Hypertension) Sister      SOCIAL HISTORY: Social History   Socioeconomic History   Marital status: Married  Tobacco Use   Smoking status: Former    Packs/day: 2.00    Years: 35.00    Pack years: 70.00    Types: Cigarettes    Start date: 09/22/1954    Quit date: 05/23/1990    Years since quitting: 31.2   Smokeless tobacco: Never  Vaping Use   Vaping Use: Never used  Substance and Sexual Activity   Alcohol use: Not Currently   Drug use: Never   Sexual activity: Not Currently    Partners: Female    Birth control/protection: None    PHYSICAL EXAM: Vitals:   08/01/21 1411  BP: 133/73  Pulse: 56   Body mass index is 25.09 kg/m. Weight: 74.8 kg (165 lb)   GENERAL: Alert, active, oriented x3  HEENT: Pupils equal reactive to light. Extraocular movements are intact. Sclera clear. Palpebral conjunctiva normal red color.Pharynx clear.  NECK: Supple with no palpable mass and no adenopathy.  LUNGS: Sound clear with no rales rhonchi or wheezes.  HEART: Regular rhythm S1 and S2 without murmur.  ABDOMEN: Soft and depressible, nontender with no palpable mass, no hepatomegaly. Wounds dry and clean.  EXTREMITIES: Well-developed well-nourished symmetrical with no dependent edema.  NEUROLOGICAL: Awake alert oriented, facial expression symmetrical, moving all extremities.  REVIEW OF DATA: I have reviewed Justin following data today: Office Visit on 07/23/2021  Component Date Value   Glucose 07/23/2021 60 (L)    Sodium 07/23/2021 135 (L)    Potassium 07/23/2021 4.0    Chloride 07/23/2021 98    Carbon Dioxide (CO2) 07/23/2021 31.5    Calcium 07/23/2021 9.4    Urea Nitrogen (BUN) 07/23/2021 18    Creatinine 07/23/2021 1.1    Glomerular Filtration Ra* 07/23/2021 64    BUN/Crea Ratio 07/23/2021 16.4    Anion Gap w/K 07/23/2021 9.5    Magnesium 07/23/2021 2.0   Ancillary Procedure on 06/24/2021  Component Date Value   LV Ejection Fraction (%) 06/24/2021 55     Aortic Valve Stenosis Gr* 06/24/2021 none    Aortic Valve Regurgitati* 06/24/2021 mild    Aortic Valve Max Velocit* 06/24/2021 1.7    Aortic Valve Stenosis Me* 06/24/2021 6.0    Mitral Valve Stenosis Gr* 06/24/2021 none    Mitral Valve Regurgitati* 06/24/2021 mild    Tricuspid Valve Regurgit* 06/24/2021 moderate    Tricuspid Valve Regurgit* 06/24/2021 2.8    LV End Diastolic Diamete* 35/36/1443 4.7    LV End Systolic Diameter* 15/40/0867 2.5    LV Septum Wall Thickness* 06/24/2021 0.96    LV Posterior Wall Thickn* 06/24/2021 1.1    Left Atrium Diameter (cm) 06/24/2021 4.6   Office Visit on 06/04/2021  Component Date Value   Vent Rate (bpm) 06/04/2021 80    PR Interval (msec) 06/04/2021 202    QRS Interval (msec) 06/04/2021 84    QT Interval (msec) 06/04/2021 346    QTc (msec) 06/04/2021 399   Office Visit on 05/30/2021  Component Date Value   Glucose 05/30/2021 151 (H)    Sodium 05/30/2021 137    Potassium 05/30/2021 3.7    Chloride 05/30/2021 98    Carbon Dioxide (  CO2) 05/30/2021 31.3    Calcium 05/30/2021 9.1    Urea Nitrogen (BUN) 05/30/2021 12    Creatinine 05/30/2021 1.0    Glomerular Filtration Ra* 05/30/2021 72    BUN/Crea Ratio 05/30/2021 12.0    Anion Gap w/K 05/30/2021 11.4    Hemoglobin A1C 05/30/2021 5.8 (H)    Average Blood Glucose (C* 05/30/2021 120    WBC (White Blood Cell Co* 05/30/2021 5.6    RBC (Red Blood Cell Coun* 05/30/2021 3.80 (L)    Hemoglobin 05/30/2021 11.8 (L)    Hematocrit 05/30/2021 36.6 (L)    MCV (Mean Corpuscular Vo* 05/30/2021 96.3    MCH (Mean Corpuscular He* 05/30/2021 31.1    MCHC (Mean Corpuscular H* 05/30/2021 32.2    Platelet Count 05/30/2021 160    RDW-CV (Red Cell Distrib* 05/30/2021 14.4    MPV (Mean Platelet Volum* 05/30/2021 10.0    Neutrophils 05/30/2021 3.70    Lymphocytes 05/30/2021 1.07    Monocytes 05/30/2021 0.61    Eosinophils 05/30/2021 0.22    Basophils 05/30/2021 0.03    Neutrophil % 05/30/2021 65.6     Lymphocyte % 05/30/2021 19.0    Monocyte % 05/30/2021 10.8    Eosinophil % 05/30/2021 3.9    Basophil% 05/30/2021 0.5    Immature Granulocyte % 05/30/2021 0.2    Immature Granulocyte Cou* 05/30/2021 0.01   Appointment on 05/16/2021  Component Date Value   Glucose 05/16/2021 54 (L)    Sodium 05/16/2021 131 (L)    Potassium 05/16/2021 3.9    Chloride 05/16/2021 92 (L)    Carbon Dioxide (CO2) 05/16/2021 33.6 (H)    Calcium 05/16/2021 8.7    Urea Nitrogen (BUN) 05/16/2021 11    Creatinine 05/16/2021 0.9    Glomerular Filtration Ra* 05/16/2021 81    BUN/Crea Ratio 05/16/2021 12.2    Anion Gap w/K 05/16/2021 9.3    Thyroid Stimulating Horm* 05/16/2021 1.016      ASSESSMENT: Justin [name] is a 80 y.o. male presenting for consultation for left recurrent inguinal hernia.    Justin [name] presents with a symptomatic, reducible recurrent left inguinal hernia. [name] was oriented about Justin diagnosis of inguinal hernia and its implication. Justin [name] was oriented about Justin treatment alternatives (observation vs surgical repair). Due to [name] symptoms, repair is recommended. [name] oriented about Justin surgical procedure, Justin use of mesh and its risk of complications such as: infection, bleeding, injury to vas deference, vasculature and testicle, injury to bowel or bladder, and chronic pain.   [name] was oriented that due to his previous intra-abdominal surgeries and his prostatectomy and his previous open inguinal hernia repair any of Justin approach to repair this hernia are going to be be very difficult.  He understand that I will try to complete a minimally invasive approach to repair this hernia but if there is complications or a lot of scar tissue from prostatectomy I will need to convert Justin case to open hernia repair.  He is also high risk of surgery due to his multiple medical comorbidities.  This medical comorbidities seems to be stable.  Regarding his cardiac history, will request cardiac  clearance to hold aspirin for 5 days.  Unilateral recurrent inguinal hernia without obstruction or gangrene [K40.91]  PLAN: 1.  Robotic assisted laparoscopic left recurrent inguinal hernia repair with mesh (32951) vs open left recurrent inguinal hernia (88416) 2.  CBC, CMP done 3.  Avoid taking aspirin 5 days before procedure 4.  Cardiology Clearance 5.  Contact us if has any question  or concern.   [name] verbalized understanding, all questions were answered, and were agreeable with Justin plan outlined above.   Herbert Pun, MD  Electronically signed by Herbert Pun, MD

## 2021-08-05 ENCOUNTER — Ambulatory Visit: Payer: Self-pay | Admitting: General Surgery

## 2021-08-06 ENCOUNTER — Encounter: Payer: Self-pay | Admitting: Dermatology

## 2021-08-08 ENCOUNTER — Encounter
Admission: RE | Admit: 2021-08-08 | Discharge: 2021-08-08 | Disposition: A | Discharge status: Home / Self Care | Payer: Medicare Other | Source: Ambulatory Visit | Attending: General Surgery | Admitting: General Surgery

## 2021-08-08 ENCOUNTER — Other Ambulatory Visit: Payer: Self-pay

## 2021-08-08 NOTE — Pre-Procedure Instructions (Signed)
Patient verified left inguinal hernia to be operated on.

## 2021-08-08 NOTE — Patient Instructions (Addendum)
Your procedure is scheduled on: Wednesday 08/14/21 Report to the Registration Desk on the 1st floor of the [facility]. To find out your arrival time, please call [phone] between 1PM - 3PM on: Tuesday 08/13/21  REMEMBER: Instructions that are not followed completely may result in serious medical risk, up to and including death; or upon the discretion of your surgeon and anesthesiologist your surgery may need to be rescheduled.  Do not eat food or drink after midnight the night before surgery.  No gum chewing, lozengers or hard candies.  TAKE THESE MEDICATIONS THE MORNING OF SURGERY WITH A SIP OF WATER: isosorbide mononitrate (IMDUR) 60 MG 24 hr tablet metoprolol tartrate (LOPRESSOR) 25 MG tablet  Omeprazole (prilosec) take night before and morning of surgery.  Use your albuterol (PROVENTIL HFA;VENTOLIN HFA) 108 (90 Base) MCG/ACT inhaler and Tiotropium Bromide-Olodaterol 2.5-2.5 MCG/ACT AERS inhalers on the day of surgery and bring to the hospital.  Hold your fexofenadine (ALLEGRA) 180 MG tablet and fluticasone (FLONASE) 50 MCG/ACT nasal spray for 7 days unless you become symptomatic  Follow recommendations from Dr. Windell Moment and stop your aspirin EC 81 MG tablet for 5 days.  One week prior to surgery: Stop Anti-inflammatories (NSAIDS) such as Advil, Aleve, Ibuprofen, Motrin, Naproxen, Naprosyn and Aspirin based products such as Excedrin, Goodys Powder, BC Powder. Stop taking your Calcium Carbonate-Vitamin D3 600-400 MG-UNIT TABS, cholecalciferol (VITAMIN D) 1000 units tablet, Lactobacillus (PROBIOTIC ACIDOPHILUS PO, Multiple Vitamins-Minerals (EMERGEN-C VITAMIN C) PACK, niacin 500 MG tablet, vitamin B-12 (CYANOCOBALAMIN) 1000 MCG tablet, and Wheat Dextrin (BENEFIBER DRINK MIX PO) ANY OVER THE COUNTER supplements until after surgery. You may however, continue to take Tylenol if needed for pain up until the day of surgery.  No Alcohol for 24 hours before or after surgery.  No  Smoking including e-cigarettes for 24 hours prior to surgery.  No chewable tobacco products for at least 6 hours prior to surgery.  No nicotine patches on the day of surgery.  Do not use any "recreational" drugs for at least a week prior to your surgery.  Please be advised that the combination of cocaine and anesthesia may have negative outcomes, up to and including death. If you test positive for cocaine, your surgery will be cancelled.  On the morning of surgery brush your teeth with toothpaste and water, you may rinse your mouth with mouthwash if you wish. Do not swallow any toothpaste or mouthwash.  Use antibacterial Soap the night before the surgery and the morning of the surgery.  Do not wear jewelry.  Do not wear lotions, powders, or colognes.   Do not shave body from the neck down 48 hours prior to surgery just in case you cut yourself which could leave a site for infection.   hearing aids and dentures may not be worn into surgery.  Do not bring valuables to the hospital. [facility] is not responsible for any missing/lost belongings or valuables.   Bring your C-PAP to the hospital with you in case you may have to spend the night.   Notify your doctor if there is any change in your medical condition (cold, fever, infection).  Wear comfortable clothing (specific to your surgery type) to the hospital.  After surgery, you can help prevent lung complications by doing breathing exercises.  Take deep breaths and cough every 1-2 hours.   When coughing or sneezing, hold a pillow firmly against your incision with both hands. This is called "splinting." Doing this helps protect your incision. It also decreases  belly discomfort.  If you are being discharged the day of surgery, you will not be allowed to drive home. You will need a responsible adult (18 years or older) to drive you home and stay with you that night.   If you are taking public transportation, you will need to have a  responsible adult (18 years or older) with you. Please confirm with your physician that it is acceptable to use public transportation.   Please call the [facility] Dept. at [phone] if you have any questions about these instructions.  Surgery Visitation Policy:  Patients undergoing a surgery or procedure may have one family member or support person with them as long as that person is not COVID-19 positive or experiencing its symptoms.  That person may remain in the waiting area during the procedure and may rotate out with other people.  Inpatient Visitation:    Visiting hours are 7 a.m. to 8 p.m. Up to two visitors ages 16+ are allowed at one time in a patient room. The visitors may rotate out with other people during the day. Visitors must check out when they leave, or other visitors will not be allowed. One designated support person may remain overnight. The visitor must pass COVID-19 screenings, use hand sanitizer when entering and exiting the patient's room and wear a mask at all times, including in the patient's room. Patients must also wear a mask when staff or their visitor are in the room. Masking is required regardless of vaccination status.

## 2021-08-11 ENCOUNTER — Encounter: Payer: Self-pay | Admitting: General Surgery

## 2021-08-11 NOTE — Progress Notes (Signed)
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 Herbert Pun, MD.  Patient previously reviewed by PAT APP prior to elective hernia repair that was performed on 04/29/2021; see my note dated 04/25/2021. Interval health history/records reviewed.   Patient underwent an uncomplicated surgical course (hernia repair) on 04/29/2021. He discharged home on POD-1.  Patient was discharged home in stable condition.  Patient followed up with surgery as an outpatient on 05/13/2021 (POD-14).  Patient was doing well overall.  He complained of significant scrotal swelling that was relieved with the use of ice.  Patient was not experiencing any significant pain.  With the use of APAP, patient only experiencing very mild "twinges" of pain.  Initially he struggled with constipation, however with the addition of MiraLAX, this symptom improved.  Patient doing well until approximately last month when he started to feel a recurrent lump in his LEFT groin.  The surgeon that performed his initial procedure in 04/2021 is no longer with the practice.  Patient was seen in follow-up consult by [facility] general surgeon Windell Moment, MD).  Patient reported that he was able to reduce hernia, however it is getting more difficult and increasingly painful.  Patient reported to have had an intermittent partial bowel obstruction that resolved with hernia  reduction.  Discussed plans for repeat surgery using a robotic assisted laparoscopic approach.  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  Patient verbalized understanding and wished to proceed with repeat surgical intervention as discussed.  Patient previously cleared by cardiology prior to his most recent procedure. With that being said, given his health history and length of time since the initial clearance from cardiology was issued, updated clearance from her cardiology team was sought by the PAT team. Per cardiology, "this patient is optimized for surgery and may proceed with the planned procedural course with a MODERATE risk of significant perioperative cardiovascular complications".  Patient is on daily antiplatelet therapy.  He has been instructed on recommendations from his cardiologist for holding his daily low-dose ASA for 5 days prior to his procedure with plans to restart as soon as postoperative bleeding risk to be minimized by primary attending surgeon.  Patient is aware that his last dose of ASA will be on 08/08/2021.  Patient denies previous perioperative complications with anesthesia in the past. In review of the available records, it is noted that patient underwent a general anesthetic course here (ASA III) in 04/2021 without documented complications.   Pertinent Clinical Results:    Ref Range & Units 07/23/2021  Glucose 70 - 110 mg/dL 60 Low    Sodium 136 - 145 mmol/L 135 Low    Potassium 3.6 - 5.1 mmol/L 4.0   Chloride 97 - 109 mmol/L 98   Carbon Dioxide (CO2) 22.0 - 32.0 mmol/L 31.5  Calcium 8.7 - 10.3 mg/dL 9.4   Urea Nitrogen (BUN) 7 - 25 mg/dL 18   Creatinine 0.7 - 1.3 mg/dL 1.1   Glomerular Filtration Rate (eGFR), MDRD Estimate >60 mL/min/1.73sq m 64   BUN/Crea Ratio 6.0 - 20.0 16.4   Anion  Gap w/K 6.0 - 16.0 9.5   Resulting Agency  [facility]  Specimen Collected: 07/23/21 09:52 Last Resulted: 07/23/21 14:32  Received From: [place]  Result Received: 08/01/21 10:41    Ref Range & Units 05/30/2021  WBC (White Blood Cell Count) 4.1 - 10.2 10^3/uL 5.6   RBC (Red Blood Cell Count) 4.69 - 6.13 10^6/uL 3.80 Low    Hemoglobin 14.1 - 18.1 gm/dL 11.8 Low    Hematocrit 40.0 - 52.0 % 36.6 Low    MCV (Mean Corpuscular Volume) 80.0 - 100.0 fl 96.3   MCH (Mean Corpuscular Hemoglobin) 27.0 - 31.2 pg 31.1   MCHC (Mean Corpuscular Hemoglobin Concentration) 32.0 - 36.0 gm/dL 32.2   Platelet Count 150 - 450 10^3/uL 160   RDW-CV (Red Cell Distribution Width) 11.6 - 14.8 % 14.4   MPV (Mean Platelet Volume) 9.4 - 12.4 fl 10.0   Neutrophils 1.50 - 7.80 10^3/uL 3.70   Lymphocytes 1.00 - 3.60 10^3/uL 1.07   Monocytes 0.00 - 1.50 10^3/uL 0.61   Eosinophils 0.00 - 0.55 10^3/uL 0.22   Basophils 0.00 - 0.09 10^3/uL 0.03   Neutrophil % 32.0 - 70.0 % 65.6   Lymphocyte % 10.0 - 50.0 % 19.0   Monocyte % 4.0 - 13.0 % 10.8   Eosinophil % 1.0 - 5.0 % 3.9   Basophil% 0.0 - 2.0 % 0.5   Immature Granulocyte % <=0.7 % 0.2   Immature Granulocyte Count <=0.06 10^3/L 0.01   Resulting Agency  [facility]  Specimen Collected: 05/30/21 08:42 Last Resulted: 05/30/21 10:31  Received From: [place] [facility]  Result Received: 07/05/21 14:52    Date: 08/12/2021 Time ECG obtained:  0832 AM Rate: 74 bpm Rhythm: Sinus rhythm first-degree AV block Axis (leads I and aVF): Normal axis Intervals: PR interval 276 ms.  QRS duration 90 ms.  QTc 401 Q ms ST segment and T wave changes: No evidence of ST segment elevation or depression Comparison: Similar to previous tracing obtained on 03/13/2021   IMAGING / PROCEDURES: CT CHEST WITHOUT CONTRAST performed on 07/05/2021 Mild paraseptal emphysema. No acute appearing airspace disease. Bland appearing post  infectious or inflammatory scarring of the bilateral lung bases. The pulmonary arteries are enlarged, as can be seen in pulmonary hypertension. Coronary artery disease. Moderate hiatal hernia. Nonobstructive left nephrolithiasis. Aortic atherosclerosis  Emphysema  CT ABDOMEN AND PELVIS WITH CONTRAST performed on 03/29/2021 Atelectasis or consolidation in the lung bases. Moderate-sized esophageal hiatal hernia. Nonobstructing stone in the lower pole left kidney. Thick-walled fluid-filled small bowel in the left lower quadrant with mesenteric edema suggesting enteritis. No obstruction. Cystic lesions suggested on the shaft of the penis. Correlation with physical examination is recommended. Prominent aortic atherosclerosis Interval reduction of the herniated bowel from the left inguinal region previously. Small residual left inguinal hernia containing fat. Multiple prior kyphoplasty procedures. Small amount of free fluid in the pelvis is likely reactive. Fluid in the colon likely indicates diarrhea.   TRANSTHORACIC ECHOCARDIOGRAM performed on 03/14/2021 Left ventricular ejection fraction, by estimation, is 35-40%. The left ventricle has moderately decreased function. The left ventricle  demonstrates regional wall motion abnormalities. Left ventricular  diastolic parameters are indeterminate.  Right ventricular systolic function is normal. The right ventricular size is normal.  The mitral valve is normal in structure. Mild mitral valve regurgitation. No evidence of mitral stenosis.  The aortic valve is normal in structure. Aortic valve regurgitation is not visualized. No aortic stenosis is present.  The inferior vena cava is normal in size with greater than 50% respiratory variability, suggesting right atrial pressure of 3 mmHg.    PULMONARY FUNCTION TESTING performed on 06/05/2020 SPIROMETRY: FVC was 2.19 liters, 68% of predicted/Post 2.58, 80%, 18% Change FEV1 was 1.54, 62% of  predicted/Post 1.72, 69%, 12% Change FEV1 ratio was 70/Post 67 FEF 25-75% liters per second was 36% of predicted/Post 32%, -10% Change Patient took Personal Albuterol Inhaler for Post Spirometry LUNG VOLUMES: TLC was 66% of predicted RV was 50% of predicted DIFFUSION CAPACITY: DLCO was 72% of predicted DLCO/VA was 120% of predicted FLOW VOLUME LOOP: Consistent with mild obstruction IMPRESSION: Spirometry is c/w mild obstruction, significant bronchodilator effect noted TLS is moderately decreased, c/w restriction DLCO is mildly decreased, DLCO/VA is supper normal     VASCULAR ULTRASOUND ABI WITH/WITHOUT TBI performed on 10/28/2018 Right:  Resting right ankle-brachial index is within normal range.  No evidence of significant right lower extremity arterial disease.  The right toe-brachial index is normal.  Right posterior tibial artery is non compressible suggestive of medial calcification.  Left:  Resting left ankle-brachial index indicates noncompressible left lower extremity arteries. The left toe-brachial index is normal.  ABIs are unreliable.  Left anterior tibial artery is non compressible and suggestive of medial calcification.   LEFT HEART CATHETERIZATION AND CORONARY ANGIOGRAPHY performed on 09/28/2018 CAD Under percent stenosis of the ostial LAD 100% stenosis of the proximal LCx Under percent stenosis of the proximal RCA Widely patent mid graft stent 100% stenosis of origin lesion Left ventricular systolic function is normal LVEDP normal No aortic valve stenosis No mitral valve stenosis or MVP evident Recommendations for continued medical management    MYOCARDIAL PERFUSION IMAGING STUDY (LEXISCAN) performed on 09/20/2018 LVEF 51% Regional wall motion reveals normal myocardial thickening and wall motion No artifacts noted Left ventricular cavity size normal Small area of mild anterior apical hypoperfusion with stress and borderline to mild reversibility with  rest Moderate risk study; clinical correlation indicated   LEFT HEART CATHETERIZATION AND CORONARY ANGIOGRAPHY performed on 11/07/2015 CAD 100% stenosis of the proximal RCA 100% stenosis of the proximal LCx 100% stenosis of the ostial LAD 100% stenosis origin lesion 65% mid graft-2 lesion 95% mid graft-1 lesion Patent LIMA-LAD bypass graft Successful PCI 4.0 x 28 mm Xience Alpine DES x 1 placed to the SVG to the RCA   Impression and Plan:  Justin [name] has been referred for pre-anesthesia review and clearance prior to him undergoing the planned anesthetic and procedural courses. Available labs, pertinent testing, and imaging results were personally reviewed by me. This patient has been appropriately cleared by cardiology with an overall MODERATE risk of significant perioperative cardiovascular complications.  Based on clinical review performed today (08/12/21), barring any significant acute changes in the patient's overall condition, it is anticipated that he will be able to proceed with the planned surgical intervention. Any acute changes in clinical condition may necessitate his procedure being postponed and/or cancelled. Patient will meet with anesthesia team (MD and/or CRNA) on this day of his procedure for preoperative evaluation/assessment.   Pre-surgical instructions were reviewed with the patient during his PAT appointment and questions were fielded by PAT clinical staff. Patient was advised that  if any questions or concerns arise prior to his procedure then he should return a call to PAT and/or his surgeon's office to discuss.  Honor Loh, MSN, APRN, FNP-C, CEN [facility]  Peri-operative Services Nurse Practitioner Phone: [phone] 08/12/21 4:43 PM  NOTE: This note has been prepared using Dragon dictation software. Despite my best ability to proofread, there is always the potential that unintentional transcriptional errors may still occur from this  process.

## 2021-08-12 ENCOUNTER — Encounter
Admission: RE | Admit: 2021-08-12 | Discharge: 2021-08-12 | Disposition: A | Discharge status: Home / Self Care | Payer: Medicare Other | Source: Ambulatory Visit | Attending: General Surgery | Admitting: General Surgery

## 2021-08-12 ENCOUNTER — Other Ambulatory Visit: Payer: Medicare Other

## 2021-08-12 ENCOUNTER — Other Ambulatory Visit: Payer: Self-pay

## 2021-08-12 DIAGNOSIS — Z20822 Contact with and (suspected) exposure to covid-19: Secondary | ICD-10-CM | POA: Insufficient documentation

## 2021-08-12 DIAGNOSIS — Z01818 Encounter for other preprocedural examination: Secondary | ICD-10-CM | POA: Diagnosis not present

## 2021-08-13 LAB — SARS CORONAVIRUS 2 (TAT 6-24 HRS): SARS Coronavirus 2: NEGATIVE

## 2021-08-13 MED ORDER — CHLORHEXIDINE GLUCONATE 0.12 % MT SOLN: 15.0000 mL | Freq: Once | OROMUCOSAL | Status: AC

## 2021-08-13 MED ORDER — ORAL CARE MOUTH RINSE: 15.0000 mL | Freq: Once | OROMUCOSAL | Status: AC

## 2021-08-13 MED ORDER — CEFAZOLIN SODIUM-DEXTROSE 2-4 GM/100ML-% IV SOLN
2.0000 g | INTRAVENOUS | Status: AC
  Administered 2021-08-14: 2 g via INTRAVENOUS

## 2021-08-13 MED ORDER — LACTATED RINGERS IV SOLN: INTRAVENOUS | Status: DC

## 2021-08-14 ENCOUNTER — Observation Stay: Payer: Medicare Other

## 2021-08-14 ENCOUNTER — Observation Stay
Admission: RE | Admit: 2021-08-14 | Discharge: 2021-08-15 | Disposition: A | Discharge status: Home / Self Care | Payer: Medicare Other | Source: Ambulatory Visit | Attending: General Surgery | Admitting: General Surgery

## 2021-08-14 ENCOUNTER — Encounter: Payer: Self-pay | Admitting: General Surgery

## 2021-08-14 ENCOUNTER — Encounter
Admission: RE | Disposition: A | Discharge status: Home / Self Care | Payer: Self-pay | Source: Ambulatory Visit | Attending: General Surgery

## 2021-08-14 ENCOUNTER — Other Ambulatory Visit: Payer: Self-pay

## 2021-08-14 ENCOUNTER — Inpatient Hospital Stay: Payer: Medicare Other | Admitting: Urgent Care

## 2021-08-14 DIAGNOSIS — G4733 Obstructive sleep apnea (adult) (pediatric): Secondary | ICD-10-CM | POA: Insufficient documentation

## 2021-08-14 DIAGNOSIS — Z8546 Personal history of malignant neoplasm of prostate: Secondary | ICD-10-CM | POA: Insufficient documentation

## 2021-08-14 DIAGNOSIS — I3139 Other pericardial effusion (noninflammatory): Secondary | ICD-10-CM

## 2021-08-14 DIAGNOSIS — Z85828 Personal history of other malignant neoplasm of skin: Secondary | ICD-10-CM | POA: Insufficient documentation

## 2021-08-14 DIAGNOSIS — N1831 Chronic kidney disease, stage 3a: Secondary | ICD-10-CM | POA: Diagnosis not present

## 2021-08-14 DIAGNOSIS — I129 Hypertensive chronic kidney disease with stage 1 through stage 4 chronic kidney disease, or unspecified chronic kidney disease: Secondary | ICD-10-CM | POA: Insufficient documentation

## 2021-08-14 DIAGNOSIS — I2510 Atherosclerotic heart disease of native coronary artery without angina pectoris: Secondary | ICD-10-CM | POA: Diagnosis not present

## 2021-08-14 DIAGNOSIS — Z79899 Other long term (current) drug therapy: Secondary | ICD-10-CM | POA: Diagnosis not present

## 2021-08-14 DIAGNOSIS — Z8679 Personal history of other diseases of the circulatory system: Secondary | ICD-10-CM | POA: Insufficient documentation

## 2021-08-14 DIAGNOSIS — K4091 Unilateral inguinal hernia, without obstruction or gangrene, recurrent: Secondary | ICD-10-CM | POA: Diagnosis not present

## 2021-08-14 DIAGNOSIS — R29810 Facial weakness: Secondary | ICD-10-CM | POA: Diagnosis not present

## 2021-08-14 DIAGNOSIS — J449 Chronic obstructive pulmonary disease, unspecified: Secondary | ICD-10-CM | POA: Diagnosis not present

## 2021-08-14 DIAGNOSIS — K2100 Gastro-esophageal reflux disease with esophagitis, without bleeding: Secondary | ICD-10-CM | POA: Diagnosis present

## 2021-08-14 DIAGNOSIS — I739 Peripheral vascular disease, unspecified: Secondary | ICD-10-CM | POA: Insufficient documentation

## 2021-08-14 HISTORY — PX: XI ROBOTIC ASSISTED INGUINAL HERNIA REPAIR WITH MESH: SHX6706

## 2021-08-14 HISTORY — PX: INSERTION OF MESH: SHX5868

## 2021-08-14 LAB — GLUCOSE, CAPILLARY: Glucose-Capillary: 96 mg/dL (ref 70–99)

## 2021-08-14 SURGERY — REPAIR, HERNIA, INGUINAL, ROBOT-ASSISTED, LAPAROSCOPIC, USING MESH
Anesthesia: General | Site: Groin | Laterality: Left

## 2021-08-14 MED ORDER — METOPROLOL TARTRATE 25 MG PO TABS
25.0000 mg | ORAL_TABLET | Freq: Two times a day (BID) | ORAL | Status: DC
  Administered 2021-08-14 – 2021-08-15 (×2): 25 mg via ORAL
  Filled 2021-08-14 (×2): qty 1

## 2021-08-14 MED ORDER — LORATADINE 10 MG PO TABS
10.0000 mg | ORAL_TABLET | Freq: Every day | ORAL | Status: DC
  Administered 2021-08-15: 10 mg via ORAL
  Filled 2021-08-14: qty 1

## 2021-08-14 MED ORDER — ONDANSETRON HCL 4 MG/2ML IJ SOLN: 4.0000 mg | Freq: Once | INTRAMUSCULAR | Status: DC | PRN

## 2021-08-14 MED ORDER — ONDANSETRON HCL 4 MG/2ML IJ SOLN: 4.0000 mg | Freq: Four times a day (QID) | INTRAMUSCULAR | Status: DC | PRN

## 2021-08-14 MED ORDER — KETOROLAC TROMETHAMINE 15 MG/ML IJ SOLN
INTRAMUSCULAR | Status: AC
  Administered 2021-08-14: 15 mg
  Filled 2021-08-14: qty 1

## 2021-08-14 MED ORDER — ISOSORBIDE MONONITRATE ER 60 MG PO TB24
60.0000 mg | ORAL_TABLET | Freq: Every morning | ORAL | Status: DC
  Administered 2021-08-15: 60 mg via ORAL
  Filled 2021-08-14 (×2): qty 1

## 2021-08-14 MED ORDER — FENTANYL CITRATE (PF) 100 MCG/2ML IJ SOLN
INTRAMUSCULAR | Status: AC
  Filled 2021-08-14: qty 2

## 2021-08-14 MED ORDER — PANTOPRAZOLE SODIUM 40 MG PO TBEC
40.0000 mg | DELAYED_RELEASE_TABLET | Freq: Every day | ORAL | Status: DC
  Administered 2021-08-15: 40 mg via ORAL
  Filled 2021-08-14: qty 1

## 2021-08-14 MED ORDER — PHENYLEPHRINE HCL (PRESSORS) 10 MG/ML IV SOLN
INTRAVENOUS | Status: DC | PRN
Start: 2021-08-14 — End: 2021-08-14
  Administered 2021-08-14 (×4): 100 ug via INTRAVENOUS

## 2021-08-14 MED ORDER — UMECLIDINIUM BROMIDE 62.5 MCG/ACT IN AEPB
1.0000 | INHALATION_SPRAY | Freq: Every day | RESPIRATORY_TRACT | Status: DC
  Filled 2021-08-14: qty 7

## 2021-08-14 MED ORDER — OXYCODONE HCL 5 MG PO TABS: 5.0000 mg | ORAL_TABLET | Freq: Once | ORAL | Status: AC | PRN

## 2021-08-14 MED ORDER — NITROGLYCERIN 0.4 MG SL SUBL: 0.4000 mg | SUBLINGUAL_TABLET | SUBLINGUAL | Status: DC | PRN

## 2021-08-14 MED ORDER — LACTATED RINGERS IV SOLN: INTRAVENOUS | Status: DC

## 2021-08-14 MED ORDER — POTASSIUM CHLORIDE CRYS ER 20 MEQ PO TBCR
20.0000 meq | EXTENDED_RELEASE_TABLET | Freq: Three times a day (TID) | ORAL | Status: DC
  Administered 2021-08-14 – 2021-08-15 (×2): 20 meq via ORAL
  Filled 2021-08-14 (×2): qty 1

## 2021-08-14 MED ORDER — ACETAMINOPHEN 10 MG/ML IV SOLN
INTRAVENOUS | Status: AC
  Filled 2021-08-14: qty 100

## 2021-08-14 MED ORDER — BUPIVACAINE-EPINEPHRINE 0.25% -1:200000 IJ SOLN
INTRAMUSCULAR | Status: DC | PRN
  Administered 2021-08-14: 3 mL

## 2021-08-14 MED ORDER — TORSEMIDE 20 MG PO TABS: 80.0000 mg | ORAL_TABLET | Freq: Every day | ORAL | Status: DC

## 2021-08-14 MED ORDER — CHLORHEXIDINE GLUCONATE 0.12 % MT SOLN
OROMUCOSAL | Status: AC
  Administered 2021-08-14: 15 mL via OROMUCOSAL
  Filled 2021-08-14: qty 15

## 2021-08-14 MED ORDER — ROCURONIUM BROMIDE 100 MG/10ML IV SOLN
INTRAVENOUS | Status: DC | PRN
  Administered 2021-08-14: 50 mg via INTRAVENOUS
  Administered 2021-08-14: 20 mg via INTRAVENOUS

## 2021-08-14 MED ORDER — MAGNESIUM OXIDE -MG SUPPLEMENT 400 (240 MG) MG PO TABS
400.0000 mg | ORAL_TABLET | Freq: Two times a day (BID) | ORAL | Status: DC
  Administered 2021-08-14 – 2021-08-15 (×2): 400 mg via ORAL
  Filled 2021-08-14 (×2): qty 1

## 2021-08-14 MED ORDER — BUPIVACAINE-EPINEPHRINE (PF) 0.25% -1:200000 IJ SOLN
INTRAMUSCULAR | Status: AC
  Filled 2021-08-14: qty 30

## 2021-08-14 MED ORDER — FENTANYL CITRATE (PF) 100 MCG/2ML IJ SOLN
INTRAMUSCULAR | Status: AC
  Administered 2021-08-14: 50 ug via INTRAVENOUS
  Filled 2021-08-14: qty 2

## 2021-08-14 MED ORDER — CEFAZOLIN SODIUM-DEXTROSE 2-4 GM/100ML-% IV SOLN
INTRAVENOUS | Status: AC
  Filled 2021-08-14: qty 100

## 2021-08-14 MED ORDER — MORPHINE SULFATE (PF) 4 MG/ML IV SOLN: 4.0000 mg | INTRAVENOUS | Status: DC | PRN

## 2021-08-14 MED ORDER — SUGAMMADEX SODIUM 200 MG/2ML IV SOLN
INTRAVENOUS | Status: DC | PRN
  Administered 2021-08-14: 100 mg via INTRAVENOUS

## 2021-08-14 MED ORDER — ZOLEDRONIC ACID 5 MG/100ML IV SOLN: 5.0000 mg | Freq: Once | INTRAVENOUS | Status: DC

## 2021-08-14 MED ORDER — ROCURONIUM BROMIDE 10 MG/ML (PF) SYRINGE
PREFILLED_SYRINGE | INTRAVENOUS | Status: AC
  Filled 2021-08-14: qty 10

## 2021-08-14 MED ORDER — DEXAMETHASONE SODIUM PHOSPHATE 10 MG/ML IJ SOLN
INTRAMUSCULAR | Status: AC
  Filled 2021-08-14: qty 1

## 2021-08-14 MED ORDER — HYDROCODONE-ACETAMINOPHEN 5-325 MG PO TABS
1.0000 | ORAL_TABLET | ORAL | Status: DC | PRN
  Administered 2021-08-14 – 2021-08-15 (×2): 1 via ORAL
  Administered 2021-08-15 (×2): 2 via ORAL
  Filled 2021-08-14: qty 2
  Filled 2021-08-14: qty 1
  Filled 2021-08-14: qty 2
  Filled 2021-08-14: qty 1

## 2021-08-14 MED ORDER — FENTANYL CITRATE (PF) 100 MCG/2ML IJ SOLN
INTRAMUSCULAR | Status: DC | PRN
  Administered 2021-08-14: 25 ug via INTRAVENOUS
  Administered 2021-08-14: 50 ug via INTRAVENOUS

## 2021-08-14 MED ORDER — NIACIN 500 MG PO TABS
500.0000 mg | ORAL_TABLET | Freq: Every day | ORAL | Status: DC
  Administered 2021-08-14: 500 mg via ORAL
  Filled 2021-08-14 (×2): qty 1

## 2021-08-14 MED ORDER — ALBUTEROL SULFATE (2.5 MG/3ML) 0.083% IN NEBU: 3.0000 mL | INHALATION_SOLUTION | RESPIRATORY_TRACT | Status: DC | PRN

## 2021-08-14 MED ORDER — DEXAMETHASONE SODIUM PHOSPHATE 10 MG/ML IJ SOLN
INTRAMUSCULAR | Status: DC | PRN
  Administered 2021-08-14: 10 mg via INTRAVENOUS

## 2021-08-14 MED ORDER — PROPOFOL 10 MG/ML IV BOLUS
INTRAVENOUS | Status: DC | PRN
  Administered 2021-08-14: 120 mg via INTRAVENOUS

## 2021-08-14 MED ORDER — ASPIRIN EC 81 MG PO TBEC: 81.0000 mg | DELAYED_RELEASE_TABLET | Freq: Every day | ORAL | Status: DC

## 2021-08-14 MED ORDER — ONDANSETRON HCL 4 MG/2ML IJ SOLN
INTRAMUSCULAR | Status: AC
  Filled 2021-08-14: qty 2

## 2021-08-14 MED ORDER — OXYCODONE HCL 5 MG PO TABS
ORAL_TABLET | ORAL | Status: AC
  Filled 2021-08-14: qty 1

## 2021-08-14 MED ORDER — HYDROCODONE-ACETAMINOPHEN 5-325 MG PO TABS: 1.0000 | ORAL_TABLET | ORAL | Status: DC | PRN

## 2021-08-14 MED ORDER — SENNOSIDES-DOCUSATE SODIUM 8.6-50 MG PO TABS
2.0000 | ORAL_TABLET | Freq: Two times a day (BID) | ORAL | Status: DC
  Administered 2021-08-14 – 2021-08-15 (×2): 2 via ORAL
  Filled 2021-08-14 (×2): qty 2

## 2021-08-14 MED ORDER — GABAPENTIN 300 MG PO CAPS
300.0000 mg | ORAL_CAPSULE | Freq: Every day | ORAL | Status: DC
  Administered 2021-08-14: 300 mg via ORAL
  Filled 2021-08-14: qty 1

## 2021-08-14 MED ORDER — MORPHINE SULFATE (PF) 4 MG/ML IV SOLN
INTRAVENOUS | Status: AC
  Administered 2021-08-14: 4 mg via INTRAVENOUS
  Filled 2021-08-14: qty 1

## 2021-08-14 MED ORDER — ONDANSETRON HCL 4 MG/2ML IJ SOLN
INTRAMUSCULAR | Status: DC | PRN
  Administered 2021-08-14: 4 mg via INTRAVENOUS

## 2021-08-14 MED ORDER — OXYCODONE HCL 5 MG/5ML PO SOLN
5.0000 mg | Freq: Once | ORAL | Status: AC | PRN
  Administered 2021-08-14: 5 mg via ORAL

## 2021-08-14 MED ORDER — ARFORMOTEROL TARTRATE 15 MCG/2ML IN NEBU
15.0000 ug | INHALATION_SOLUTION | Freq: Two times a day (BID) | RESPIRATORY_TRACT | Status: DC
  Administered 2021-08-14 – 2021-08-15 (×2): 15 ug via RESPIRATORY_TRACT
  Filled 2021-08-14 (×4): qty 2

## 2021-08-14 MED ORDER — ENOXAPARIN SODIUM 40 MG/0.4ML IJ SOSY
40.0000 mg | PREFILLED_SYRINGE | INTRAMUSCULAR | Status: DC
  Administered 2021-08-15: 40 mg via SUBCUTANEOUS
  Filled 2021-08-14: qty 0.4

## 2021-08-14 MED ORDER — FLUTICASONE PROPIONATE 50 MCG/ACT NA SUSP
2.0000 | Freq: Every day | NASAL | Status: DC
  Administered 2021-08-14: 2 via NASAL
  Filled 2021-08-14: qty 16

## 2021-08-14 MED ORDER — FENTANYL CITRATE (PF) 100 MCG/2ML IJ SOLN
25.0000 ug | INTRAMUSCULAR | Status: DC | PRN
  Administered 2021-08-14: 25 ug via INTRAVENOUS

## 2021-08-14 MED ORDER — ACETAMINOPHEN 10 MG/ML IV SOLN
1000.0000 mg | Freq: Once | INTRAVENOUS | Status: DC | PRN
  Administered 2021-08-14: 1000 mg via INTRAVENOUS

## 2021-08-14 MED ORDER — ATORVASTATIN CALCIUM 20 MG PO TABS: 40.0000 mg | ORAL_TABLET | Freq: Every day | ORAL | Status: DC

## 2021-08-14 MED ORDER — ONDANSETRON 4 MG PO TBDP
4.0000 mg | ORAL_TABLET | Freq: Four times a day (QID) | ORAL | Status: DC | PRN
  Filled 2021-08-14: qty 1

## 2021-08-14 MED ORDER — POLYETHYLENE GLYCOL 3350 17 G PO PACK
17.0000 g | PACK | Freq: Two times a day (BID) | ORAL | Status: DC
  Administered 2021-08-14 – 2021-08-15 (×2): 17 g via ORAL
  Filled 2021-08-14 (×2): qty 1

## 2021-08-14 MED ORDER — PROPOFOL 10 MG/ML IV BOLUS
INTRAVENOUS | Status: AC
  Filled 2021-08-14: qty 40

## 2021-08-14 SURGICAL SUPPLY — 50 items
BAG INFUSER PRESSURE 100CC (MISCELLANEOUS) IMPLANT
BLADE SURG SZ11 CARB STEEL (BLADE) ×3 IMPLANT
CHLORAPREP W/TINT 26 (MISCELLANEOUS) ×2 IMPLANT
COVER TIP SHEARS 8 DVNC (MISCELLANEOUS) ×2 IMPLANT
COVER TIP SHEARS 8MM DA VINCI (MISCELLANEOUS) ×1
COVER WAND RF STERILE (DRAPES) ×3 IMPLANT
DEFOGGER SCOPE WARMER CLEARIFY (MISCELLANEOUS) ×3 IMPLANT
DERMABOND ADVANCED (GAUZE/BANDAGES/DRESSINGS) ×1
DERMABOND ADVANCED .7 DNX12 (GAUZE/BANDAGES/DRESSINGS) ×2 IMPLANT
DRAPE ARM DVNC X/XI (DISPOSABLE) ×6 IMPLANT
DRAPE COLUMN DVNC XI (DISPOSABLE) ×2 IMPLANT
DRAPE DA VINCI XI ARM (DISPOSABLE) ×3
DRAPE DA VINCI XI COLUMN (DISPOSABLE) ×1
ELECT REM PT RETURN 9FT ADLT (ELECTROSURGICAL) ×3
ELECTRODE REM PT RTRN 9FT ADLT (ELECTROSURGICAL) ×2 IMPLANT
GAUZE 4X4 16PLY ~~LOC~~+RFID DBL (SPONGE) ×3 IMPLANT
GLOVE SURG ENC MOIS LTX SZ6.5 (GLOVE) ×7 IMPLANT
GLOVE SURG UNDER POLY LF SZ6.5 (GLOVE) ×7 IMPLANT
GOWN STRL REUS W/ TWL LRG LVL3 (GOWN DISPOSABLE) ×6 IMPLANT
GOWN STRL REUS W/TWL LRG LVL3 (GOWN DISPOSABLE) ×3
IRRIGATOR SUCT 8 DISP DVNC XI (IRRIGATION / IRRIGATOR) IMPLANT
IRRIGATOR SUCTION 8MM XI DISP (IRRIGATION / IRRIGATOR)
IV CATH ANGIO 12GX3 LT BLUE (NEEDLE) ×1 IMPLANT
IV NS 1000ML (IV SOLUTION)
IV NS 1000ML BAXH (IV SOLUTION) IMPLANT
KIT PINK PAD W/HEAD ARE REST (MISCELLANEOUS) ×3
KIT PINK PAD W/HEAD ARM REST (MISCELLANEOUS) ×2 IMPLANT
LABEL OR SOLS (LABEL) IMPLANT
MANIFOLD NEPTUNE II (INSTRUMENTS) ×3 IMPLANT
MESH PROGRIP HERNIA FLAT 15X15 (Mesh General) ×1 IMPLANT
NDL INSUFFLATION 14GA 120MM (NEEDLE) ×2 IMPLANT
NEEDLE HYPO 22GX1.5 SAFETY (NEEDLE) ×3 IMPLANT
NEEDLE INSUFFLATION 14GA 120MM (NEEDLE) ×3 IMPLANT
OBTURATOR OPTICAL STANDARD 8MM (TROCAR) ×1
OBTURATOR OPTICAL STND 8 DVNC (TROCAR) ×2
OBTURATOR OPTICALSTD 8 DVNC (TROCAR) ×2 IMPLANT
PACK LAP CHOLECYSTECTOMY (MISCELLANEOUS) ×3 IMPLANT
SEAL CANN UNIV 5-8 DVNC XI (MISCELLANEOUS) ×6 IMPLANT
SEAL XI 5MM-8MM UNIVERSAL (MISCELLANEOUS) ×3
SET TUBE SMOKE EVAC HIGH FLOW (TUBING) ×3 IMPLANT
SOLUTION ELECTROLUBE (MISCELLANEOUS) ×3 IMPLANT
SUT MNCRL 4-0 (SUTURE) ×1
SUT MNCRL 4-0 27XMFL (SUTURE) ×2
SUT VIC AB 2-0 SH 27 (SUTURE) ×5
SUT VIC AB 2-0 SH 27XBRD (SUTURE) ×2 IMPLANT
SUT VLOC 90 S/L VL9 GS22 (SUTURE) ×3 IMPLANT
SUTURE MNCRL 4-0 27XMF (SUTURE) ×2 IMPLANT
TAPE TRANSPORE STRL 2 31045 (GAUZE/BANDAGES/DRESSINGS) IMPLANT
TRAY FOLEY MTR SLVR 16FR STAT (SET/KITS/TRAYS/PACK) ×2 IMPLANT
WATER STERILE IRR 500ML POUR (IV SOLUTION) ×2 IMPLANT

## 2021-08-14 NOTE — Progress Notes (Signed)
Pt c/o facial numbness more so to the left.  Mild facial droop noted.  Pt assessed by provider Sharion Settler.  "Code Stroke" initiated; order placed for CT of head.  Pt's surgical attending notified.

## 2021-08-14 NOTE — Anesthesia Postprocedure Evaluation (Signed)
Anesthesia Post Note  Patient: Justin [name]  Procedure(s) Performed: XI ROBOTIC ASSISTED INGUINAL HERNIA REPAIR WITH MESH  VS open (Left: Groin) INSERTION OF MESH  Patient location during evaluation: PACU Anesthesia Type: General Level of consciousness: awake and alert, oriented and patient cooperative Pain management: pain level controlled Vital Signs Assessment: post-procedure vital signs reviewed and stable Respiratory status: spontaneous breathing, nonlabored ventilation and respiratory function stable Cardiovascular status: blood pressure returned to baseline and stable Postop Assessment: adequate PO intake Anesthetic complications: no   No notable events documented.   Last Vitals:  Vitals:   08/14/21 1345 08/14/21 1400  BP: 122/77   Pulse: 77   Resp: 16 18  Temp:  36.4 C  SpO2: 95%     Last Pain:  Vitals:   08/14/21 1400  TempSrc:   PainSc: [place]
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
## 2021-08-14 NOTE — Op Note (Signed)
Preoperative diagnosis: Recurrent left inguinal hernia.   Postoperative diagnosis: Recurrent left inguinal hernia.  Procedure: Robotic assisted Laparoscopic Transabdominal preperitoneal laparoscopic (TAPP) repair of recurrent left inguinal hernia.  Anesthesia: GETA  Surgeon: Dr. Windell Moment  Wound Classification: Clean  Indications:  Patient is a 80 y.o. male developed a recurrent left inguinal hernia. Repair was indicated.  Findings: 1. Left direct recurrent Inguinal hernia identified 2. Vas deferens and cord structures identified and preserved 3. 15 x 12 cm Progrip used for repair 4. Adequate hemostasis.       Description of procedure: The patient was taken to the operating room and the correct side of surgery was verified. The patient was placed supine with arms tucked at the sides. After obtaining adequate anesthesia, the patient's abdomen was prepped and draped in standard sterile fashion. The patient was placed in the Trendelenburg position. A time-out was completed verifying correct patient, procedure, site, positioning, and implant(s) and/or special equipment prior to beginning this procedure. A Veress needle was placed at the umbilicus and pneumoperitoneum created with insufflation of carbon dioxide to 15 mmHg. After the Veress needle was removed, an 8-mm trocar was placed on epigastric area and the 30 angled laparoscope inserted. Two 8-mm trocars were then placed lateral to the rectus sheath under direct visualization. Both inguinal regions were inspected and the median umbilical ligament, medial umbilical ligament, and lateral umbilical fold were identified.  The robotic arms were docked. The robotic scope was inserted and the pelvic area anatomy targeted.  The peritoneum was incised with scissors along a line 6 cm above the superior edge of the hernia defect, extending from the median umbilical ligament to the anterior superior iliac spine. The peritoneal flap was mobilized  inferiorly using blunt and sharp dissection. The inferior epigastric vessels were exposed and the pubic symphysis was identified. This was a very difficult and time consuming dissection due to scar tissue from previous prostatectomy. Cooper's ligament was dissected to its junction with the iliac vein. The dissection was continued inferiorly to the iliopubic tract, with care taken to avoid injury to the femoral branch of the genitofemoral nerve and the lateral femoral cutaneous nerve. The cord structures were parietalized. The hernia was identified and reduced by gentle traction.  The direct hernia sac was noted mobilized from the cord structures and reduced into the peritoneal cavity.  A 15 x 12 cm piece of Progrip mesh was rolled longitudinally into a compact cylinder and passed through a trocar. The cylinder was placed along the inferior aspect of the working space and unrolled into place to completely cover the direct, indirect, and femoral spaces. The mesh was secured into place superiorly to the anterior abdominal wall and inferiorly and medially to Cooper's ligament with absorbable sutures. Care was taken to avoid the inferolateral triangles containing the iliac vessels and genital nerves. The peritoneal flap was closed over the mesh and secured with suture in similar positions of safety. After ensuring adequate hemostasis, the trocars were removed and the pneumoperitoneum allowed to escape. The trocar incisions were closed using monocryl and skin adhesive dressings applied.  The patient tolerated the procedure well and was taken to the postanesthesia care unit in stable condition.   Specimen: None  Complications: None  Estimated Blood Loss: 10 mL

## 2021-08-14 NOTE — Anesthesia Procedure Notes (Signed)
Procedure Name: Intubation Date/Time: 08/14/2021 9:51 AM Performed by: Gentry Fitz, CRNA Pre-anesthesia Checklist: Patient identified, Emergency Drugs available, Suction available and Patient being monitored Patient Re-evaluated:Patient Re-evaluated prior to induction Oxygen Delivery Method: Circle system utilized Preoxygenation: Pre-oxygenation with 100% oxygen Induction Type: IV induction Ventilation: Mask ventilation without difficulty Laryngoscope Size: McGraph and 4 Grade View: Grade II Tube type: Oral Tube size: 7.5 mm Number of attempts: 1 Airway Equipment and Method: Stylet and Oral airway Placement Confirmation: ETT inserted through vocal cords under direct vision, positive ETCO2 and breath sounds checked- equal and bilateral Secured at: 22 cm Tube secured with: Tape Dental Injury: Teeth and Oropharynx as per pre-operative assessment

## 2021-08-14 NOTE — Anesthesia Preprocedure Evaluation (Addendum)
Anesthesia Evaluation  Patient identified by MRN, date of birth, ID band Patient awake    Reviewed: Allergy & Precautions, NPO status , Patient's Chart, lab work & pertinent test results  History of Anesthesia Complications Negative for: history of anesthetic complications  Airway Mallampati: II   Neck ROM: Full    Dental  (+) Partial Lower, Partial Upper   Pulmonary sleep apnea , COPD, former smoker (quit 1991),    Pulmonary exam normal breath sounds clear to auscultation       Cardiovascular hypertension, + CAD (s/p MI, CABG, stents) and + Peripheral Vascular Disease  Normal cardiovascular exam+ dysrhythmias (a fib)  Rhythm:Regular Rate:Normal  ECG 08/12/21:  Sinus rhythm with 1st degree A-V block Otherwise normal ECG  Echo 06/24/21:  NORMAL LEFT VENTRICULAR SYSTOLIC FUNCTION  WITH MILD LVH  NORMAL RIGHT VENTRICULAR SYSTOLIC FUNCTION  MODERATE VALVULAR REGURGITATION   NO VALVULAR STENOSIS  ESTIMATED LVEF >55%  Aortic: NORMAL GRADIENTS  AOV: MILDLY THICKENED, FULLY MOBILE LEAFLETS; SCLEROSIS WITH NO EVIDENCE OF STENOSIS  Mitral: MILD MR  Tricuspid: MODERATE TR (2.16m/s)  Pulmonic: MILD PI  MODERATE LAE  MILD RAE  Closest EF: >55% (Estimated)  Aortic: MILD AR  Mitral: MILD MR  Tricuspid: MODERATE TR     Neuro/Psych HOH    GI/Hepatic GERD  ,  Endo/Other  negative endocrine ROS  Renal/GU Renal disease (nephrolithiasis)   BPH; prostate CA    Musculoskeletal  (+) Arthritis ,   Abdominal   Peds  Hematology negative hematology ROS (+)   Anesthesia Other Findings Reviewed and agree with [facility] Males pre-anesthesia clinical review note.  Cardiology note 07/23/21:  80 y.o. male with  ICD-10-CM ICD-9-CM  1. CAD of autologous bypass graft-status post non-ST elevation myocardial infarction status post PCI of the saphenous vein graft to the PL. recent cath showed no appreciable change from cath in 2017.  Medical management was recommended. He is doing well with this regimen as he has no chest pain at present. Continue with current regimen remaining as active as possible. Keep feet elevated when not ambulating and limit sodium intake. Place compression garments as soon as possible after arising. No evidence of active angina. No obvious ischemia. I25.810 414.02  2. 3-vessel CAD-as per above I25.10 414.00  3. Atherosclerosis of native coronary-artery of native heart-as per above I25.10 414.01  4. Mixed hyperlipidemia-continue with high intensity statin E78.2 272.2  5. OSA (obstructive sleep apnea)-weight loss and CPAP. Continue with current regimen G47.33 327.23   6. Lower extremity edema-. They have improved with diuretics. We will continue with current protocol. Daily weights are being carried out and his weight has been stable. Limit sodium intake and stop taking salt tablets.  7. Dyspnea on exertion-etiology unclear. May be related to emphysema or COPD versus pulmonary fibrosis. We will proceed with chest CT to evaluate for this.  7. Emphysema-has improved his breathing somewhat with bronchodilators.  8. Dizziness-we will place a Holter monitor to determine whether paroxysmal A. fib is playing a role.  Return in about 3 months (around 10/23/2021) for fath in [place].   Reproductive/Obstetrics                            Anesthesia Physical Anesthesia Plan  ASA: 4  Anesthesia Plan: General   Post-op Pain Management:    Induction: Intravenous  PONV Risk Score and Plan: 2 and Ondansetron, Dexamethasone and Treatment may vary due to age or medical condition  Airway  Management Planned: Oral ETT  Additional Equipment:   Intra-op Plan:   Post-operative Plan: Extubation in OR  Informed Consent: I have reviewed the patients History and Physical, chart, labs and discussed the procedure including the risks, benefits and alternatives for the proposed anesthesia with the  patient or authorized representative who has indicated his/her understanding and acceptance.     Dental advisory given  Plan Discussed with: CRNA  Anesthesia Plan Comments: (Patient consented for risks of anesthesia including but not limited to:  - adverse reactions to medications - damage to eyes, teeth, lips or other oral mucosa - nerve damage due to positioning  - sore throat or hoarseness - damage to heart, brain, nerves, lungs, other parts of body or loss of life  Informed patient about role of CRNA in peri- and intra-operative care.  Patient voiced understanding.)        Anesthesia Quick Evaluation

## 2021-08-14 NOTE — Progress Notes (Signed)
Patient awake/alert x4.  Patient c/o's chronic lower back pain 7-8/10, hernia surgery performed today 4/10. Medicated as ordered, tolerating fluids and crackers. Wife updated on phone # [phone].

## 2021-08-14 NOTE — Plan of Care (Signed)
  Problem: Education: Goal: Knowledge of General Education information will improve Description: Including pain rating scale, medication(s)/side effects and non-pharmacologic comfort measures Outcome: Progressing   Problem: Pain Managment: Goal: General experience of comfort will improve Outcome: Progressing   Problem: Safety: Goal: Ability to remain free from injury will improve Outcome: Progressing   

## 2021-08-14 NOTE — Consult Note (Signed)
[facility] Tele[facility]rology Consult Services   Patient Name:   Daqwan, Dougal Date of Birth:   [birth year] Identification Number:   MRN - [id number] Date of Service:   08/14/2021 21:16:52  Diagnosis:       G93.49 - Encephalopathy Multifactorial       I63.412 - Cerebrovascular accident (CVA) due to embolism of left middle cerebral artery (HCCC)  Impression:      80 year old man admitted for hernia repair, no weakness on exam. No thrombolytics given resolving symptoms. Consider complicated migraine versus encephalopathy versus stroke. HA txl ketorolac, relgan, depakote. MR brain/MRA, tele, neurology following along.  Metrics: Last Known Well: 08/14/2021 20:40:00 [facility] Notification Time: 08/14/2021 21:16:52 Stamp Time: 08/14/2021 21:16:52 Initial Response Time: 08/14/2021 21:19:28 Symptoms: L sided numbness. NIHSS Start Assessment Time: 08/14/2021 21:23:53 Patient is not a candidate for Thrombolytic. Thrombolytic Medical Decision: 08/14/2021 21:28:36 Patient was not deemed candidate for Thrombolytic because of following reasons: No disabling symptoms.  CT head showed no acute hemorrhage or acute core infarct.  ED Physician not notified of diagnostic impression and management plan because Discussed with inpatient team.  Advanced Imaging: Advanced Imaging Not Completed because:  Subacute strokes on CT and/or LKW beyond 6-24 hrs per guidelines for thrombectomy consideration, vascular studies can be completed routinely on admission   Our recommendations are outlined below.  Recommendations:        Stroke/Telemetry Floor       Neuro Checks       Bedside Swallow Eval       DVT Prophylaxis       IV Fluids, Normal Saline       Head of Bed 30 Degrees       Euglycemia and Avoid Hyperthermia (PRN Acetaminophen)  Routine Consultation with [facility] for Follow up Care  Sign Out:       Discussed with Emergency Department  Provider    ------------------------------------------------------------------------------  History of Present Illness: Patient is a 80 year old Male.  Inpatient stroke alert was called for symptoms of L sided numbness.  80 year old admitted currently, he had hernia repair this AM. Now has L sided weakness and numbness, with facial droop. Hx of A fib. He had a headache develop after the numbness on his face as well, headache is getting better now. He is on ASA 81mg  daily at home.   Social History: Smoking: Former Alcohol Use: No Drug Use: No  Family History:      Brother with CAD  Review of System:  31 Points Review of Systems was performed and was negative except mentioned in HPI.  No Anticoagulant use   Antiplatelet use: Yes ASA 81mg   Allergies:  Reviewed    Examination: BP(122/66), Pulse(93), 1A: Level of Consciousness - Alert; keenly responsive + 0 1B: Ask Month and Age - Both Questions Right + 0 1C: Blink Eyes & Squeeze Hands - Performs Both Tasks + 0 2: Test Horizontal Extraocular Movements - Normal + 0 3: Test Visual Fields - No Visual Loss + 0 4: Test Facial Palsy (Use Grimace if Obtunded) - Normal symmetry + 0 5A: Test Left Arm Motor Drift - No Drift for 10 Seconds + 0 5B: Test Right Arm Motor Drift - No Drift for 10 Seconds + 0 6A: Test Left Leg Motor Drift - No Drift for 5 Seconds + 0 6B: Test Right Leg Motor Drift - No Drift for 5 Seconds + 0 7: Test Limb Ataxia (FNF/Heel-Shin) - No Ataxia + 0 8: Test Sensation -  Mild-Moderate Loss: Less Sharp/More Dull + 1 9: Test Language/Aphasia - Normal; No aphasia + 0 10: Test Dysarthria - Normal + 0 11: Test Extinction/Inattention - No abnormality + 0  NIHSS Score: 1   Pre-Morbid Modified Rankin Scale: 0 Points = No symptoms at all   Patient/Family was informed the Neurology Consult would occur via TeleHealth consult by way of interactive audio and video telecommunications and consented to receiving care in  this manner.   Patient is being evaluated for possible acute neurologic impairment and high probability of imminent or life-threatening deterioration. I spent total of 30 minutes providing care to this patient, including time for face to face visit via telemedicine, review of medical records, imaging studies and discussion of findings with providers, the patient and/or family.   Dr Marry Guan   [facility] [phone]  Case [id number]

## 2021-08-14 NOTE — Transfer of Care (Signed)
Immediate Anesthesia Transfer of Care Note  Patient: Justin [name]  Procedure(s) Performed: XI ROBOTIC ASSISTED INGUINAL HERNIA REPAIR WITH MESH  VS open (Left: Groin) INSERTION OF MESH  Patient Location: PACU  Anesthesia Type:General  Level of Consciousness: drowsy  Airway & Oxygen Therapy: Patient Spontanous Breathing and Patient connected to face mask oxygen  Post-op Assessment: Report given to RN and Post -op Vital signs reviewed and stable  Post vital signs: Reviewed and stable  Last Vitals:  Vitals Value Taken Time  BP 130/103 08/14/21 1251  Temp    Pulse 73 08/14/21 1254  Resp 16 08/14/21 1254  SpO2 98 % 08/14/21 1254  Vitals shown include unvalidated device data.  Last Pain:  Vitals:   08/14/21 0919  TempSrc: Oral  PainSc: 0-No pain         Complications: No notable events documented.

## 2021-08-14 NOTE — Interval H&P Note (Signed)
History and Physical Interval Note:  08/14/2021 9:16 AM  Justin [name]  has presented today for surgery, with the diagnosis of K40.91 Unilateral recurrent inguinal hernia w/o obstruction or gangrene.  The various methods of treatment have been discussed with the patient and family. After consideration of risks, benefits and other options for treatment, the patient has consented to  Procedure(s) with comments: XI [place]  VS open (Left) - as a surgical intervention.  The patient's history has been reviewed, patient examined, no change in status, stable for surgery.  I have reviewed the patient's chart and labs.  Questions were answered to the patient's satisfaction.     Herbert Pun

## 2021-08-15 ENCOUNTER — Observation Stay (HOSPITAL_BASED_OUTPATIENT_CLINIC_OR_DEPARTMENT_OTHER)
Admission: RE | Admit: 2021-08-15 | Discharge: 2021-08-15 | Disposition: A | Discharge status: Home / Self Care | Payer: Medicare Other | Source: Ambulatory Visit | Attending: Internal Medicine | Admitting: Internal Medicine

## 2021-08-15 ENCOUNTER — Observation Stay: Payer: Medicare Other

## 2021-08-15 DIAGNOSIS — I340 Nonrheumatic mitral (valve) insufficiency: Secondary | ICD-10-CM | POA: Diagnosis not present

## 2021-08-15 DIAGNOSIS — I361 Nonrheumatic tricuspid (valve) insufficiency: Secondary | ICD-10-CM

## 2021-08-15 DIAGNOSIS — R29810 Facial weakness: Secondary | ICD-10-CM | POA: Diagnosis not present

## 2021-08-15 DIAGNOSIS — K4091 Unilateral inguinal hernia, without obstruction or gangrene, recurrent: Secondary | ICD-10-CM | POA: Diagnosis not present

## 2021-08-15 LAB — COMPREHENSIVE METABOLIC PANEL
ALT: 48 U/L — ABNORMAL HIGH (ref 0–44)
AST: 48 U/L — ABNORMAL HIGH (ref 15–41)
Albumin: 3.3 g/dL — ABNORMAL LOW (ref 3.5–5.0)
Alkaline Phosphatase: 67 U/L (ref 38–126)
Anion gap: 8 (ref 5–15)
BUN: 22 mg/dL (ref 8–23)
CO2: 26 mmol/L (ref 22–32)
Calcium: 8.2 mg/dL — ABNORMAL LOW (ref 8.9–10.3)
Chloride: 100 mmol/L (ref 98–111)
Creatinine, Ser: 1.26 mg/dL — ABNORMAL HIGH (ref 0.61–1.24)
GFR, Estimated: 58 mL/min — ABNORMAL LOW (ref >60)
Glucose, Bld: 117 mg/dL — ABNORMAL HIGH (ref 70–99)
Potassium: 4.3 mmol/L (ref 3.5–5.1)
Sodium: 134 mmol/L — ABNORMAL LOW (ref 135–145)
Total Bilirubin: 1.1 mg/dL (ref 0.3–1.2)
Total Protein: 6 g/dL — ABNORMAL LOW (ref 6.5–8.1)

## 2021-08-15 LAB — TSH: TSH: 0.379 u[IU]/mL (ref 0.350–4.500)

## 2021-08-15 LAB — MAGNESIUM: Magnesium: 2.2 mg/dL (ref 1.7–2.4)

## 2021-08-15 LAB — CBC
HCT: 32.6 % — ABNORMAL LOW (ref 39.0–52.0)
Hemoglobin: 10.5 g/dL — ABNORMAL LOW (ref 13.0–17.0)
MCH: 30.5 pg (ref 26.0–34.0)
MCHC: 32.2 g/dL (ref 30.0–36.0)
MCV: 94.8 fL (ref 80.0–100.0)
Platelets: 122 10*3/uL — ABNORMAL LOW (ref 150–400)
RBC: 3.44 MIL/uL — ABNORMAL LOW (ref 4.22–5.81)
RDW: 14.6 % (ref 11.5–15.5)
WBC: 11.4 10*3/uL — ABNORMAL HIGH (ref 4.0–10.5)
nRBC: 0 % (ref 0.0–0.2)

## 2021-08-15 LAB — T4, FREE: Free T4: 0.68 ng/dL (ref 0.61–1.12)

## 2021-08-15 LAB — ECHOCARDIOGRAM COMPLETE BUBBLE STUDY
AR max vel: 3.06 cm2
AV Area VTI: 3.31 cm2
AV Area mean vel: 2.92 cm2
AV Mean grad: 2 mmHg
AV Peak grad: 3.8 mmHg
Ao pk vel: 0.98 m/s
Area-P 1/2: 3.02 cm2
MV VTI: 2.18 cm2
S' Lateral: 2.5 cm

## 2021-08-15 LAB — LIPID PANEL
Cholesterol: 131 mg/dL (ref 0–200)
HDL: 63 mg/dL (ref >40)
LDL Cholesterol: 55 mg/dL (ref 0–99)
Total CHOL/HDL Ratio: 2.1 RATIO
Triglycerides: 66 mg/dL (ref <150)
VLDL: 13 mg/dL (ref 0–40)

## 2021-08-15 LAB — PHOSPHORUS: Phosphorus: 3.1 mg/dL (ref 2.5–4.6)

## 2021-08-15 LAB — TROPONIN I (HIGH SENSITIVITY): Troponin I (High Sensitivity): 14 ng/L (ref <18)

## 2021-08-15 NOTE — Progress Notes (Signed)
*  PRELIMINARY RESULTS* Echocardiogram 2D Echocardiogram has been performed.  Justin [name] 08/15/2021, 12:02 PM

## 2021-08-15 NOTE — Progress Notes (Signed)
PROGRESS NOTE    [name]  KVQ:[id number] DOB: [birth year] DOA: 08/14/2021 PCP: Rusty Aus, MD   Brief Narrative/Hospital Course: Justin [name], 80 y.o. male with PMH of paroxysmal A. fib, thrombocytopenia shoulder pain, left inguinal hernia, shoulder pain, left inguinal hernia, OSA, CAD, BPH basal cell carcinoma PVD dyslipidemia emphysema prostate cancer CKD who had laparoscopic transabdominal repair of recurrent left inguinal hernia and noted to have left facial numbness and droop at 8:50 PM 11/23 code stroke was called seen by teleneurology. Patient was seen in consultation by [facility],   Subjective: Seen and examined this morning on the bedside chair ate his meal.  Denies any numbness tingling droop weakness. Reports it was transient episode that has resolved. Assessment & Plan:  Transient left facial droop/decreased sensation ?  TIA.  Work-up so far unremarkable MRI brain, carotid duplex.Echo is pending.  TSH is stable, recent A1c in prediabetic range 5.8 on 9/8.  Check fasting lipid profile as add-on.Await for further neurology recommendation, he does have history of paroxysmal atrial fibrillation it appears anticoagulation was not restarted due to his low platelet count on recent admission in 60s to 70s but this admission 122's.  He is followed with Dr. Ubaldo Glassing from cardiology.  Cont as per Neurology recommendation- echo cam back- lvef 45-50%, moderate pericardial effusion but no evidence of pericardial tamponade,previous echo in June   EF 35-40%-Discussed w/ Dr Saralyn Pilar, OKAY TO D/C HOME- for asymptomatic pericardial effusion.  Thrombocytopenia: Previously platelet count were in the 70,000 range, this admission 122 on last July 7 Normal 253K Recent Labs  Lab 08/15/21 0123  PLT 122*    Left inguinal hernia:s/p repair robotically doing well tolerating diet  OSA: Continue bedtime CPAP at home  CAD/PVD: No chest pain.  Continue aspirin Lipitor, Imdur and metoprolol.  Resume Demadex  on discharge.  BPH: Voiding well.  Emphysema: No respiratory issues  GERD: Continue Pepcid.   DVT prophylaxis: enoxaparin (LOVENOX) injection 40 mg Start: 08/15/21 0800 SCD's Start: 08/14/21 1324 Code Status:   Code Status: Full Code Family Communication: plan of care discussed with patient at bedside. Status is: Observation Anticipated Disposition:per Surgery- home once cleared by neurology   Objective: Vitals last 24 hrs: Vitals:   08/14/21 2226 08/15/21 0602 08/15/21 0737 08/15/21 0803  BP: 104/65 101/61  123/65  Pulse: 97 72  72  Resp: 17 17  17   Temp: 97.7 F (36.5 C) 98.1 F (36.7 C)  98 F (36.7 C)  TempSrc:      SpO2: 92% 96% 97% 97%  Weight:      Height:       Weight change:   Intake/Output Summary (Last 24 hours) at 08/15/2021 1105 Last data filed at 08/15/2021 1043 Gross per 24 hour  Intake 2155 ml  Output 2110 ml  Net 45 ml   Net IO Since Admission: 245 mL [08/15/21 1105]   Physical Examination: General exam: AA0x3, weak,older than stated age. HEENT:Oral mucosa moist, Ear/Nose WNL grossly,dentition normal. Respiratory system: B/l diminished BS, no use of accessory muscle, non tender. Cardiovascular system: S1 & S2 +,No JVD. Gastrointestinal system: Abdomen soft, NT,ND, BS+. Nervous System:Alert, awake, moving extremities. Extremities: edema none, distal peripheral pulses palpable.  Skin: No rashes, no icterus. MSK: Normal muscle bulk, tone, power.  Medications reviewed:  Scheduled Meds:  arformoterol  15 mcg Nebulization BID   And   umeclidinium bromide  1 puff Inhalation Daily   aspirin EC  81 mg Oral Q1200   atorvastatin  40  mg Oral Q1200   enoxaparin (LOVENOX) injection  40 mg Subcutaneous Q24H   fluticasone  2 spray Each Nare QHS   gabapentin  300 mg Oral QHS   isosorbide mononitrate  60 mg Oral q AM   loratadine  10 mg Oral Daily   magnesium oxide  400 mg Oral BID   metoprolol tartrate  25 mg Oral BID   niacin  500 mg Oral QHS    pantoprazole  40 mg Oral Daily   polyethylene glycol  17 g Oral BID   potassium chloride SA  20 mEq Oral TID   senna-docusate  2 tablet Oral BID   Continuous Infusions:  Diet Order             Diet Heart Room service appropriate? Yes; Fluid consistency: Thin  Diet effective now                          Weight change:   Wt Readings from Last 3 Encounters:  08/14/21 72.6 kg  08/08/21 72.6 kg  05/13/21 72.4 kg     Consultants:see note  Procedures:see note Antimicrobials: Anti-infectives (From admission, onward)    Start     Dose/Rate Route Frequency Ordered Stop   08/14/21 0903  ceFAZolin (ANCEF) 2-4 GM/100ML-% IVPB       Note to Pharmacy: Doreen Salvage   : cabinet override      08/14/21 0903 08/14/21 1009   08/14/21 0600  ceFAZolin (ANCEF) IVPB 2g/100 mL premix        2 g 200 mL/hr over 30 Minutes Intravenous On call to O.R. 08/13/21 2307 08/14/21 0954      Culture/Microbiology    Component Value Date/Time   SDES  03/10/2021 1338    NASAL SWAB Performed at [facility], [place] [address] [place], [place] [address]    [facility]  03/10/2021 1338    NONE Performed at [facility], [address] [place], [place] [address]    CULT  03/10/2021 1338    NO MRSA DETECTED Performed at [facility], [place] [address], [place], [place] [address]    REPTSTATUS 03/13/2021 FINAL 03/10/2021 1338    Other culture-see note  Unresulted Labs (From admission, onward)    None     Data Reviewed: I have personally reviewed following labs and imaging studies CBC: Recent Labs  Lab 08/15/21 0123  WBC 11.4*  HGB 10.5*  HCT 32.6*  MCV 94.8  PLT 295*   Basic Metabolic Panel: Recent Labs  Lab 08/15/21 0123  NA 134*  K 4.3  CL 100  CO2 26  GLUCOSE 117*  BUN 22  CREATININE 1.26*  CALCIUM 8.2*  MG 2.2  PHOS 3.1   GFR: Estimated Creatinine Clearance: 45.2 mL/min (A) (by C-G formula based on SCr of 1.26 mg/dL (H)). Liver Function  Tests: Recent Labs  Lab 08/15/21 0123  AST 48*  ALT 48*  ALKPHOS 67  BILITOT 1.1  PROT 6.0*  ALBUMIN 3.3*   No results for input(s): LIPASE, AMYLASE in the last 168 hours. No results for input(s): AMMONIA in the last 168 hours. Coagulation Profile: No results for input(s): INR, PROTIME in the last 168 hours. Cardiac Enzymes: No results for input(s): CKTOTAL, CKMB, CKMBINDEX, TROPONINI in the last 168 hours. BNP (last 3 results) No results for input(s): PROBNP in the last 8760 hours. HbA1C: No results for input(s): HGBA1C in the last 72 hours. CBG: Recent Labs  Lab  08/14/21 0910  GLUCAP 96   Lipid Profile: No results for input(s): CHOL, HDL, LDLCALC, TRIG, CHOLHDL, LDLDIRECT in the last 72 hours. Thyroid Function Tests: Recent Labs    08/15/21 0123  TSH 0.379  FREET4 0.68   Anemia Panel: No results for input(s): VITAMINB12, FOLATE, FERRITIN, TIBC, IRON, RETICCTPCT in the last 72 hours. Sepsis Labs: No results for input(s): PROCALCITON, LATICACIDVEN in the last 168 hours.  Recent Results (from the past 240 hour(s))  SARS CORONAVIRUS 2 (TAT 6-24 HRS) Nasopharyngeal Nasopharyngeal Swab     Status: None   Collection Time: 08/12/21  9:20 AM   Specimen: Nasopharyngeal Swab  Result Value Ref Range Status   SARS Coronavirus 2 NEGATIVE NEGATIVE Final    Comment: (NOTE) SARS-CoV-2 target nucleic acids are NOT DETECTED.  The SARS-CoV-2 RNA is generally detectable in upper and lower respiratory specimens during the acute phase of infection. Negative results do not preclude SARS-CoV-2 infection, do not rule out co-infections with other pathogens, and should not be used as the sole basis for treatment or other patient management decisions. Negative results must be combined with clinical observations, patient history, and epidemiological information. The expected result is Negative.  Fact Sheet for Patients: SugarRoll.be  Fact Sheet for  Healthcare Providers: https://www.woods-mathews.com/  This test is not yet approved or cleared by the [place] FDA and  has been authorized for detection and/or diagnosis of SARS-CoV-2 by FDA under an Emergency Use Authorization (EUA). This EUA will remain  in effect (meaning this test can be used) for the duration of the COVID-19 declaration under Se ction 564(b)(1) of the Act, 21 U.S.C. section 360bbb-3(b)(1), unless the authorization is terminated or revoked sooner.  Performed at [facility], [place] [address] [place], [place] [address]      Radiology Studies: MR BRAIN WO CONTRAST  Result Date: 08/14/2021 CLINICAL DATA:  Left facial droop, stroke suspected EXAM: MRI HEAD WITHOUT CONTRAST TECHNIQUE: Multiplanar, multiecho pulse sequences of the brain and surrounding structures were obtained without intravenous contrast. COMPARISON:  No prior MRI, correlation is made with same day CT head. FINDINGS: Brain: No acute infarction, hemorrhage, hydrocephalus, extra-axial collection, or mass lesion. The ventricles and sulci are within normal limits for age. Scattered T2 hyperintense signal in the periventricular white matter, likely the sequela of mild chronic small vessel ischemic disease. Vascular: Normal flow voids. Skull and upper cervical spine: Normal marrow signal. Sinuses/Orbits: Negative. Status post bilateral lens replacements. Other: The mastoids are well aerated. Electronically Signed   By: Merilyn Baba M.D.   On: 08/14/2021 23:38   US Carotid Bilateral  Result Date: 08/15/2021 CLINICAL DATA:  Facial droop EXAM: BILATERAL CAROTID DUPLEX ULTRASOUND TECHNIQUE: Pearline Cables scale imaging, color Doppler and duplex ultrasound were performed of bilateral carotid and vertebral arteries in the neck. COMPARISON:  None. FINDINGS: Criteria: Quantification of carotid stenosis is based on velocity parameters that correlate the residual internal carotid diameter with NASCET-based  stenosis levels, using the diameter of the distal internal carotid lumen as the denominator for stenosis measurement. The following velocity measurements were obtained: RIGHT ICA: 73/25 cm/sec CCA: 8/9 cm/sec SYSTOLIC ICA/CCA RATIO:  1.1 ECA: 71 cm/sec LEFT ICA: 72/23 cm/sec CCA: 4/97 cm/sec SYSTOLIC ICA/CCA RATIO:  1.2 ECA: 149 cm/sec RIGHT CAROTID ARTERY: Preliminary grayscale images demonstrate no significant atherosclerotic plaque. Waveforms, velocities and flow velocity ratios show no evidence of focal hemodynamically significant stenosis. RIGHT VERTEBRAL ARTERY:  Antegrade in nature. LEFT CAROTID ARTERY: Preliminary grayscale images demonstrate mild atherosclerotic plaque in the  distal common carotid artery and proximal internal carotid artery. The waveforms, velocities and flow velocity ratios however demonstrate no evidence of focal hemodynamically significant stenosis. LEFT VERTEBRAL ARTERY:  Antegrade in nature. IMPRESSION: No evidence of carotid stenosis. Electronically Signed   By: Inez Catalina M.D.   On: 08/15/2021 01:31   CT HEAD CODE STROKE WO CONTRAST`  Result Date: 08/14/2021 CLINICAL DATA:  Code stroke. Facial numbness, left greater than right, mild facial droop EXAM: CT HEAD WITHOUT CONTRAST TECHNIQUE: Contiguous axial images were obtained from the base of the skull through the vertex without intravenous contrast. COMPARISON:  None. FINDINGS: Brain: No evidence of acute infarction, hemorrhage, cerebral edema, mass, mass effect, or midline shift. Ventricles and sulci are normal for age. No extra-axial fluid collection. Periventricular white matter changes, likely the sequela of chronic small vessel ischemic disease. Vascular: No hyperdense vessel. Atherosclerotic calcifications in the intracranial carotid and vertebral arteries. Skull: Normal. Negative for fracture or focal lesion. Sinuses/Orbits: Minimal mucosal thickening in the ethmoid air cells. Status post bilateral lens replacements.  Other: The mastoid air cells are well aerated. ASPECTS [facility] Stroke Program Early CT Score) - Ganglionic level infarction (caudate, lentiform nuclei, internal capsule, insula, M1-M3 cortex): 7 - Supraganglionic infarction (M4-M6 cortex): 3 Total score (0-10 with 10 being normal): 10 IMPRESSION: 1. No acute intracranial process. 2. ASPECTS is 10 Code stroke imaging results were communicated on 08/14/2021 at 9:25 pm to provider [place] via secure text paging. Electronically Signed   By: Merilyn Baba M.D.   On: 08/14/2021 21:26     LOS: 1 day   Antonieta Pert, MD [facility]  08/15/2021, 11:05 AM

## 2021-08-15 NOTE — Discharge Summary (Signed)
Patient ID: Justin [name] MRN: [id number] DOB/AGE: March 20, 1941 80 y.o.  Admit date: 08/14/2021 Discharge date: 08/15/2021   Discharge Diagnoses:  Principal Problem:   Facial droop Active Problems:   Atherosclerosis of coronary artery   Obstructive apnea   Esophagitis, reflux   PAD (peripheral artery disease) (HCC)   Recurrent inguinal hernia   Procedures: Robotic assisted laparoscopic recurrent left inguinal hernia  Hospital Course: Patient admitted for observation after robotic assisted laparoscopic left inguinal hernia repair.  During the night he developed numbness of the left side of the face.  This activated stroke protocol.  CT scan of the head shows no acute intracranial pathology.  Neurology evaluated the patient and recommended MRI/MRA.  This was negative for any intracranial pathology.  Patient also had carotid duplex which shows no significant stenosis.  2D echo showed moderate pericardial effusion, recommendation to follow up with Cardiology as outpatient.  This morning patient without numbness of the left face.  He did not have any motor deficit.  After complete valuation by hospitalist and neurology, assessment was that patient did not suffer any acute stroke.  Patient is tolerating diet.  Patient is ambulating.  Pain is under control.  Recommendation by neurology basically was to continue medical management as he was doing at home with aspirin 81 mg daily, home lipid regimen and continue glucose management.  No recommendation of starting anticoagulation.  Physical Exam Constitutional:      Appearance: Normal appearance.  HENT:     Head: Normocephalic.     Nose: Nose normal.  Eyes:     Extraocular Movements: Extraocular movements intact.     Pupils: Pupils are equal, round, and reactive to light.  Cardiovascular:     Rate and Rhythm: Normal rate and regular rhythm.     Pulses: Normal pulses.  Pulmonary:     Effort: Pulmonary effort is normal.     Breath sounds:  Normal breath sounds.  Abdominal:     General: Abdomen is flat.     Palpations: Abdomen is soft.  Genitourinary:    Penis: Normal.   Musculoskeletal:        General: Normal range of motion.     Cervical back: Normal range of motion.  Skin:    General: Skin is warm.  Neurological:     General: No focal deficit present.     Mental Status: He is alert and oriented to person, place, and time. Mental status is at baseline.     Cranial Nerves: No cranial nerve deficit.     Sensory: No sensory deficit.     Motor: No weakness.  Psychiatric:        Mood and Affect: Mood normal.     Consults: Hospitalist, neurologist  Disposition: Discharge disposition: 01-Home or Self Care      Discharge Instructions     Ambulatory referral to Cardiology   Complete by: As directed    Diet - low sodium heart healthy   Complete by: As directed    Discharge instructions   Complete by: As directed    Follow-up with your cardiologist regarding moderate pericardial effusion noted on echocardiogram which is asymptomatic.  Also discuss about anticoagulation.   Increase activity slowly   Complete by: As directed       Allergies as of 08/15/2021       Reactions   Hydrochlorothiazide W-triamterene Nausea Only        Medication List     TAKE these medications    acetaminophen 325  MG tablet Commonly known as: TYLENOL Take 975 mg by mouth in the morning, at noon, and at bedtime.   albuterol 108 (90 Base) MCG/ACT inhaler Commonly known as: VENTOLIN HFA Inhale 2 puffs into the lungs every 4 (four) hours as needed for wheezing or shortness of breath.   aspirin EC 81 MG tablet Take 81 mg by mouth daily at 12 noon.   atorvastatin 80 MG tablet Commonly known as: LIPITOR Take 40 mg by mouth daily at 12 noon.   BENEFIBER DRINK MIX PO Take 1 Dose by mouth every morning.   Calcium Carbonate-Vitamin D3 600-400 MG-UNIT Tabs Take 1 tablet by mouth in the morning and at bedtime.    cholecalciferol 25 MCG (1000 UNIT) tablet Commonly known as: VITAMIN D Take 3,000 Units by mouth daily at 12 noon.   Emergen-C Vitamin C Pack Take 1 packet by mouth daily.   fexofenadine 180 MG tablet Commonly known as: ALLEGRA Take 180 mg by mouth daily before breakfast.   fluticasone 50 MCG/ACT nasal spray Commonly known as: FLONASE Place 2 sprays into both nostrils at bedtime.   gabapentin 300 MG capsule Commonly known as: NEURONTIN Take 300 mg by mouth at bedtime.   hydrocortisone 25 MG suppository Commonly known as: ANUSOL-HC Place 25 mg rectally daily as needed for hemorrhoids or anal itching.   ibuprofen 600 MG tablet Commonly known as: ADVIL Take 1 tablet (600 mg total) by mouth every 6 (six) hours as needed for mild pain.   isosorbide mononitrate 60 MG 24 hr tablet Commonly known as: IMDUR Take 60 mg by mouth in the morning.   Magnesium 400 MG Caps Take 400 mg by mouth 2 (two) times daily.   metoprolol tartrate 25 MG tablet Commonly known as: LOPRESSOR Take 25 mg by mouth 2 (two) times daily.   niacin 500 MG tablet Take 500 mg by mouth in the morning, at noon, and at bedtime.   nitroGLYCERIN 0.4 MG SL tablet Commonly known as: NITROSTAT Place 0.4 mg under the tongue every 5 (five) minutes x 3 doses as needed for chest pain. If no relief call MD or go to emergency room.   omeprazole 40 MG capsule Commonly known as: PRILOSEC Take 40 mg by mouth 2 (two) times daily. Before breakfast & mid afternoon   polyethylene glycol 17 g packet Commonly known as: MIRALAX / GLYCOLAX Take 17 g by mouth daily as needed for moderate constipation. What changed:  when to take this additional instructions   potassium chloride SA 20 MEQ tablet Commonly known as: KLOR-CON Take 1 tablet (20 mEq total) by mouth daily. What changed: when to take this   PROBIOTIC ACIDOPHILUS PO Take 2 capsules by mouth daily at 12 noon.   senna-docusate 8.6-50 MG tablet Commonly known  as: Senokot-S Take 2 tablets by mouth 2 (two) times daily.   TART CHERRY PO Take 3,000 mg by mouth every evening.   Tiotropium Bromide-Olodaterol 2.5-2.5 MCG/ACT Aers Inhale 2 puffs into the lungs in the morning.   torsemide 20 MG tablet Commonly known as: DEMADEX Take 80 mg by mouth daily. Before breakfast   vitamin B-12 1000 MCG tablet Commonly known as: CYANOCOBALAMIN Take 1,000 mcg by mouth daily at 12 noon.   zoledronic acid 5 MG/100ML Soln injection Commonly known as: RECLAST Inject 5 mg into the vein once.        Follow-up Information     Herbert Pun, MD Follow up in 2 week(s).   Specialty: General Surgery Contact information:  [address] [place] [address] [phone]         Teodoro Spray, MD Follow up.   Specialty: Cardiology Why: Follow-up with your cardiologist regarding moderate pericardial effusion noted on echocardiogram which is asymptomatic.  Also discuss about anticoagulation. Contact information: [place] [address] [phone]

## 2021-08-15 NOTE — Plan of Care (Signed)
  Problem: Education: Goal: Knowledge of General Education information will improve Description: Including pain rating scale, medication(s)/side effects and non-pharmacologic comfort measures Outcome: Progressing   Problem: Pain Managment: Goal: General experience of comfort will improve Outcome: Progressing   Problem: Safety: Goal: Ability to remain free from injury will improve Outcome: Progressing   

## 2021-08-15 NOTE — Discharge Instructions (Signed)
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
## 2021-08-15 NOTE — Plan of Care (Signed)
  Problem: Education: Goal: Knowledge of General Education information will improve Description: Including pain rating scale, medication(s)/side effects and non-pharmacologic comfort measures 08/15/2021 1625 by Trula Slade, RN Outcome: Adequate for Discharge 08/15/2021 1502 by Trula Slade, RN Outcome: Progressing   Problem: Pain Managment: Goal: General experience of comfort will improve 08/15/2021 1625 by Trula Slade, RN Outcome: Adequate for Discharge 08/15/2021 1502 by Trula Slade, RN Outcome: Progressing   Problem: Safety: Goal: Ability to remain free from injury will improve 08/15/2021 1625 by Trula Slade, RN Outcome: Adequate for Discharge 08/15/2021 1502 by Trula Slade, RN Outcome: Progressing  Appropriate for discharge - ppw reviewed and pt voiced understanding

## 2021-08-15 NOTE — Progress Notes (Signed)
Met with the patient at the bedside, Completed the MOON He lives at home with his wife and is independent He has DME at home including Hospital bed, RW and 3 in 1 He has used [facility] for home health last time he had hernia repair and would like to use them again I notified Cheryl at [employer] He doe snot need DME NO additional needs

## 2021-08-15 NOTE — Progress Notes (Signed)
Reviewed Verbally with the patient at the bedside, he stated understanding and asked that I sign

## 2021-08-17 ENCOUNTER — Encounter: Payer: Self-pay | Admitting: General Surgery

## 2021-08-20 ENCOUNTER — Encounter: Payer: Self-pay | Admitting: General Surgery

## 2021-10-10 ENCOUNTER — Encounter: Payer: Self-pay | Admitting: Dermatology

## 2021-10-10 DIAGNOSIS — L209 Atopic dermatitis, unspecified: Secondary | ICD-10-CM

## 2021-10-10 NOTE — Telephone Encounter (Signed)
Yes, thank you. Be sure it says twice a day as needed up to 2 weeks. Avoid applying to face, groin, and axilla. Use as directed. Long-term use can cause thinning of the skin.  Thanks!

## 2021-10-11 MED ORDER — TRIAMCINOLONE ACETONIDE 0.1 % EX OINT: TOPICAL_OINTMENT | CUTANEOUS | 0 refills | Status: DC

## 2022-02-12 ENCOUNTER — Encounter: Payer: Self-pay | Admitting: Gastroenterology

## 2022-02-13 ENCOUNTER — Ambulatory Visit: Payer: Medicare Other | Admitting: Anesthesiology

## 2022-02-13 ENCOUNTER — Ambulatory Visit
Admission: RE | Admit: 2022-02-13 | Discharge: 2022-02-13 | Disposition: A | Discharge status: Home / Self Care | Payer: Medicare Other | Attending: Gastroenterology | Admitting: Gastroenterology

## 2022-02-13 ENCOUNTER — Encounter
Admission: RE | Disposition: A | Discharge status: Home / Self Care | Payer: Self-pay | Source: Home / Self Care | Attending: Gastroenterology

## 2022-02-13 ENCOUNTER — Encounter: Payer: Self-pay | Admitting: Gastroenterology

## 2022-02-13 DIAGNOSIS — D125 Benign neoplasm of sigmoid colon: Secondary | ICD-10-CM | POA: Diagnosis not present

## 2022-02-13 DIAGNOSIS — D120 Benign neoplasm of cecum: Secondary | ICD-10-CM | POA: Insufficient documentation

## 2022-02-13 DIAGNOSIS — K6389 Other specified diseases of intestine: Secondary | ICD-10-CM | POA: Diagnosis not present

## 2022-02-13 DIAGNOSIS — E785 Hyperlipidemia, unspecified: Secondary | ICD-10-CM | POA: Insufficient documentation

## 2022-02-13 DIAGNOSIS — G4733 Obstructive sleep apnea (adult) (pediatric): Secondary | ICD-10-CM | POA: Diagnosis not present

## 2022-02-13 DIAGNOSIS — I4891 Unspecified atrial fibrillation: Secondary | ICD-10-CM | POA: Insufficient documentation

## 2022-02-13 DIAGNOSIS — Z9981 Dependence on supplemental oxygen: Secondary | ICD-10-CM | POA: Insufficient documentation

## 2022-02-13 DIAGNOSIS — Z86010 Personal history of colonic polyps: Secondary | ICD-10-CM | POA: Diagnosis not present

## 2022-02-13 DIAGNOSIS — Z951 Presence of aortocoronary bypass graft: Secondary | ICD-10-CM | POA: Insufficient documentation

## 2022-02-13 DIAGNOSIS — N189 Chronic kidney disease, unspecified: Secondary | ICD-10-CM | POA: Insufficient documentation

## 2022-02-13 DIAGNOSIS — K219 Gastro-esophageal reflux disease without esophagitis: Secondary | ICD-10-CM | POA: Diagnosis not present

## 2022-02-13 DIAGNOSIS — D124 Benign neoplasm of descending colon: Secondary | ICD-10-CM | POA: Diagnosis not present

## 2022-02-13 DIAGNOSIS — Z79899 Other long term (current) drug therapy: Secondary | ICD-10-CM | POA: Insufficient documentation

## 2022-02-13 DIAGNOSIS — M109 Gout, unspecified: Secondary | ICD-10-CM | POA: Insufficient documentation

## 2022-02-13 DIAGNOSIS — K5730 Diverticulosis of large intestine without perforation or abscess without bleeding: Secondary | ICD-10-CM | POA: Diagnosis not present

## 2022-02-13 DIAGNOSIS — D509 Iron deficiency anemia, unspecified: Secondary | ICD-10-CM | POA: Diagnosis not present

## 2022-02-13 DIAGNOSIS — I129 Hypertensive chronic kidney disease with stage 1 through stage 4 chronic kidney disease, or unspecified chronic kidney disease: Secondary | ICD-10-CM | POA: Insufficient documentation

## 2022-02-13 DIAGNOSIS — I700 Atherosclerosis of aorta: Secondary | ICD-10-CM | POA: Insufficient documentation

## 2022-02-13 DIAGNOSIS — K3189 Other diseases of stomach and duodenum: Secondary | ICD-10-CM | POA: Diagnosis not present

## 2022-02-13 DIAGNOSIS — K640 First degree hemorrhoids: Secondary | ICD-10-CM | POA: Diagnosis not present

## 2022-02-13 DIAGNOSIS — D122 Benign neoplasm of ascending colon: Secondary | ICD-10-CM | POA: Insufficient documentation

## 2022-02-13 DIAGNOSIS — I2510 Atherosclerotic heart disease of native coronary artery without angina pectoris: Secondary | ICD-10-CM | POA: Diagnosis not present

## 2022-02-13 DIAGNOSIS — I252 Old myocardial infarction: Secondary | ICD-10-CM | POA: Diagnosis not present

## 2022-02-13 DIAGNOSIS — D123 Benign neoplasm of transverse colon: Secondary | ICD-10-CM | POA: Insufficient documentation

## 2022-02-13 DIAGNOSIS — Z8546 Personal history of malignant neoplasm of prostate: Secondary | ICD-10-CM | POA: Insufficient documentation

## 2022-02-13 DIAGNOSIS — J439 Emphysema, unspecified: Secondary | ICD-10-CM | POA: Insufficient documentation

## 2022-02-13 DIAGNOSIS — M1990 Unspecified osteoarthritis, unspecified site: Secondary | ICD-10-CM | POA: Insufficient documentation

## 2022-02-13 DIAGNOSIS — Z85828 Personal history of other malignant neoplasm of skin: Secondary | ICD-10-CM | POA: Insufficient documentation

## 2022-02-13 DIAGNOSIS — K449 Diaphragmatic hernia without obstruction or gangrene: Secondary | ICD-10-CM | POA: Diagnosis not present

## 2022-02-13 DIAGNOSIS — Z955 Presence of coronary angioplasty implant and graft: Secondary | ICD-10-CM | POA: Insufficient documentation

## 2022-02-13 DIAGNOSIS — Z87891 Personal history of nicotine dependence: Secondary | ICD-10-CM | POA: Diagnosis not present

## 2022-02-13 HISTORY — DX: Gout, unspecified: M10.9

## 2022-02-13 HISTORY — PX: ESOPHAGOGASTRODUODENOSCOPY: SHX5428

## 2022-02-13 HISTORY — DX: Emphysema, unspecified: J43.9

## 2022-02-13 HISTORY — PX: COLONOSCOPY: SHX5424

## 2022-02-13 SURGERY — COLONOSCOPY
Anesthesia: General

## 2022-02-13 MED ORDER — EPHEDRINE SULFATE (PRESSORS) 50 MG/ML IJ SOLN
INTRAMUSCULAR | Status: DC | PRN
  Administered 2022-02-13 (×2): 10 mg via INTRAVENOUS

## 2022-02-13 MED ORDER — SODIUM CHLORIDE 0.9 % IV SOLN: INTRAVENOUS | Status: DC

## 2022-02-13 MED ORDER — PROPOFOL 500 MG/50ML IV EMUL
INTRAVENOUS | Status: DC | PRN
  Administered 2022-02-13: 100 ug/kg/min via INTRAVENOUS

## 2022-02-13 MED ORDER — LIDOCAINE HCL (CARDIAC) PF 100 MG/5ML IV SOSY
PREFILLED_SYRINGE | INTRAVENOUS | Status: DC | PRN
  Administered 2022-02-13: 50 mg via INTRAVENOUS

## 2022-02-13 MED ORDER — PROPOFOL 10 MG/ML IV BOLUS
INTRAVENOUS | Status: DC | PRN
  Administered 2022-02-13: 50 mg via INTRAVENOUS

## 2022-02-13 NOTE — Op Note (Signed)
[facility] Patient Name: Justin [name] Procedure Date: 02/13/2022 12:03 PM MRN: [id number] Account #: [id number] Date of Birth: Dec 15, 1940 Admit Type: Outpatient Age: 81 Room: [facility] ENDO ROOM 1 Gender: Male Note Status: Finalized Instrument Name: Upper Endoscope [id number] Procedure:             Upper GI endoscopy Indications:           Iron deficiency anemia Providers:             Rueben Bash, DO Referring MD:          Rusty Aus, MD (Referring MD) Medicines:             Monitored Anesthesia Care Complications:         No immediate complications. Estimated blood loss:                         Minimal. Procedure:             Pre-Anesthesia Assessment:                        - Prior to the procedure, a History and Physical was                         performed, and patient medications and allergies were                         reviewed. The patient is competent. The risks and                         benefits of the procedure and the sedation options and                         risks were discussed with the patient. All questions                         were answered and informed consent was obtained.                         Patient identification and proposed procedure were                         verified by the physician, the nurse, the anesthetist                         and the technician in the endoscopy suite. Mental                         Status Examination: alert and oriented. Airway                         Examination: normal oropharyngeal airway and neck                         mobility. Respiratory Examination: poor air movement.                         CV Examination: normal. Prophylactic Antibiotics: The  patient does not require prophylactic antibiotics.                         Prior Anticoagulants: The patient has taken no                         previous anticoagulant or antiplatelet agents. ASA                          Grade Assessment: IV - A patient with severe systemic                         disease that is a constant threat to life. After                         reviewing the risks and benefits, the patient was                         deemed in satisfactory condition to undergo the                         procedure. The anesthesia plan was to use monitored                         anesthesia care (MAC). Immediately prior to                         administration of medications, the patient was                         re-assessed for adequacy to receive sedatives. The                         heart rate, respiratory rate, oxygen saturations,                         blood pressure, adequacy of pulmonary ventilation, and                         response to care were monitored throughout the                         procedure. The physical status of the patient was                         re-assessed after the procedure.                        After obtaining informed consent, the endoscope was                         passed under direct vision. Throughout the procedure,                         the patient's blood pressure, pulse, and oxygen                         saturations were monitored continuously. The Endoscope  was introduced through the mouth, and advanced to the                         second part of duodenum. The upper GI endoscopy was                         accomplished without difficulty. The patient tolerated                         the procedure well. Findings:      The duodenal bulb, first portion of the duodenum and second portion of       the duodenum were normal. Biopsies for histology were taken with a cold       forceps for evaluation of celiac disease. Estimated blood loss was       minimal.      Diffuse atrophic mucosa was found in the entire examined stomach.       Biopsies were taken with a cold forceps for Helicobacter pylori testing.        Estimated blood loss was minimal.      A 4 cm hiatal hernia was present. Estimated blood loss: none.      The Z-line was regular. Estimated blood loss: none.      Esophagogastric landmarks were identified: the gastroesophageal junction       was found at 34 cm from the incisors.      The exam of the esophagus was otherwise normal. Impression:            - Normal duodenal bulb, first portion of the duodenum                         and second portion of the duodenum. Biopsied.                        - Gastric mucosal atrophy. Biopsied.                        - 4 cm hiatal hernia.                        - Z-line regular.                        - Esophagogastric landmarks identified. Recommendation:        - Discharge patient to home.                        - Resume previous diet.                        - Continue present medications.                        - Await pathology results.                        - Return to GI clinic as previously scheduled.                        - The findings and recommendations were discussed with  the patient.                        - The findings and recommendations were discussed with                         the patient's family. Procedure Code(s):     --- Professional ---                        [phone], Esophagogastroduodenoscopy, flexible,                         transoral; with biopsy, single or multiple Diagnosis Code(s):     --- Professional ---                        K31.89, Other diseases of stomach and duodenum                        K44.9, Diaphragmatic hernia without obstruction or                         gangrene                        D50.9, Iron deficiency anemia, unspecified CPT copyright 2019 [employer]. All rights reserved. The codes documented in this report are preliminary and upon coder review may  be revised to meet current compliance requirements. Attending Participation:      I  personally performed the entire procedure. Volney American, DO [name] DO, DO 02/13/2022 12:26:02 PM This report has been signed electronically. Number of Addenda: 0 Note Initiated On: 02/13/2022 12:03 PM Estimated Blood Loss:  Estimated blood loss was minimal.      [facility]

## 2022-02-13 NOTE — Transfer of Care (Signed)
Immediate Anesthesia Transfer of Care Note  Patient: Justin [name]  Procedure(s) Performed: COLONOSCOPY ESOPHAGOGASTRODUODENOSCOPY (EGD)  Patient Location: PACU and Endoscopy Unit  Anesthesia Type:General  Level of Consciousness: drowsy  Airway & Oxygen Therapy: Patient Spontanous Breathing  Post-op Assessment: Report given to RN  Post vital signs: stable  Last Vitals:  Vitals Value Taken Time  BP 93/76 02/13/22 1340  Temp    Pulse 127 02/13/22 1341  Resp 15 02/13/22 1341  SpO2 96 % 02/13/22 1341  Vitals shown include unvalidated device data.  Last Pain:  Vitals:   02/13/22 1143  TempSrc: Tympanic  PainSc: 5          Complications: No notable events documented.

## 2022-02-13 NOTE — Anesthesia Postprocedure Evaluation (Signed)
Anesthesia Post Note  Patient: Justin [name]  Procedure(s) Performed: COLONOSCOPY ESOPHAGOGASTRODUODENOSCOPY (EGD)  Patient location during evaluation: PACU Anesthesia Type: General Level of consciousness: awake Pain management: satisfactory to patient Vital Signs Assessment: post-procedure vital signs reviewed and stable Respiratory status: spontaneous breathing and nonlabored ventilation Cardiovascular status: stable Anesthetic complications: no   No notable events documented.   Last Vitals:  Vitals:   02/13/22 1355 02/13/22 1410  BP: 107/70 105/72  Pulse:    Resp:    Temp:    SpO2:      Last Pain:  Vitals:   02/13/22 1410  TempSrc:   PainSc: 0-No pain                 VAN STAVEREN,Sultan Pargas

## 2022-02-13 NOTE — Interval H&P Note (Signed)
History and Physical Interval Note: Preprocedure H&P from 02/13/22  was reviewed and there was no interval change after seeing and examining the patient.  Written consent was obtained from the patient after discussion of risks, benefits, and alternatives. Patient has consented to proceed with Esophagogastroduodenoscopy and Colonoscopy with possible intervention   02/13/2022 12:06 PM  Justin [name]  has presented today for surgery, with the diagnosis of Iron deficiency anemia, unspecified iron deficiency anemia type (D50.9).  The various methods of treatment have been discussed with the patient and family. After consideration of risks, benefits and other options for treatment, the patient has consented to  Procedure(s): COLONOSCOPY (N/A) ESOPHAGOGASTRODUODENOSCOPY (EGD) (N/A) as a surgical intervention.  The patient's history has been reviewed, patient examined, no change in status, stable for surgery.  I have reviewed the patient's chart and labs.  Questions were answered to the patient's satisfaction.     [name]
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
## 2022-02-13 NOTE — Op Note (Signed)
[facility] Gastroenterology Patient Name: Justin [name] Procedure Date: 02/13/2022 12:02 PM MRN: [id number] Account #: [id number] Date of Birth: May 29, 1941 Admit Type: Outpatient Age: 81 Room: [facility] ENDO ROOM 1 Gender: Male Note Status: Finalized Instrument Name: Colonoscope [id number] Procedure:             Colonoscopy Indications:           Iron deficiency anemia Providers:             [name] DO, DO Referring MD:          Rusty Aus, MD (Referring MD) Medicines:             Monitored Anesthesia Care Complications:         No immediate complications. Estimated blood loss:                         Minimal. Procedure:             Pre-Anesthesia Assessment:                        - Prior to the procedure, a History and Physical was                         performed, and patient medications and allergies were                         reviewed. The patient is competent. The risks and                         benefits of the procedure and the sedation options and                         risks were discussed with the patient. All questions                         were answered and informed consent was obtained.                         Patient identification and proposed procedure were                         verified by the physician, the nurse, the anesthetist                         and the technician in the endoscopy suite. Mental                         Status Examination: alert and oriented. Airway                         Examination: normal oropharyngeal airway and neck                         mobility. Respiratory Examination: poor air movement.                         CV Examination: normal. Prophylactic Antibiotics: The  patient does not require prophylactic antibiotics.                         Prior Anticoagulants: The patient has taken no                         previous anticoagulant or antiplatelet agents. ASA                          Grade Assessment: IV - A patient with severe systemic                         disease that is a constant threat to life. After                         reviewing the risks and benefits, the patient was                         deemed in satisfactory condition to undergo the                         procedure. The anesthesia plan was to use monitored                         anesthesia care (MAC). Immediately prior to                         administration of medications, the patient was                         re-assessed for adequacy to receive sedatives. The                         heart rate, respiratory rate, oxygen saturations,                         blood pressure, adequacy of pulmonary ventilation, and                         response to care were monitored throughout the                         procedure. The physical status of the patient was                         re-assessed after the procedure.                        After obtaining informed consent, the colonoscope was                         passed under direct vision. Throughout the procedure,                         the patient's blood pressure, pulse, and oxygen                         saturations were monitored continuously. The  Colonoscope was introduced through the anus and                         advanced to the the cecum, identified by appendiceal                         orifice and ileocecal valve. The colonoscopy was                         somewhat difficult due to inadequate bowel prep, a                         redundant colon and significant looping. Successful                         completion of the procedure was aided by changing the                         patient to a supine position, straightening and                         shortening the scope to obtain bowel loop reduction,                         using scope torsion, applying abdominal pressure and                          lavage. The patient tolerated the procedure well. The                         quality of the bowel preparation was evaluated using                         the BBPS [facility] Bowel Preparation Scale) with scores                         of: Right Colon = 1 (portion of mucosa seen, but other                         areas not well seen due to staining, residual stool                         and/or opaque liquid), Transverse Colon = 2 (minor                         amount of residual staining, small fragments of stool                         and/or opaque liquid, but mucosa seen well) and Left                         Colon = 2 (minor amount of residual staining, small                         fragments of stool and/or opaque liquid, but mucosa  seen well). The total BBPS score equals 5. The quality                         of the bowel preparation was fair. Findings:      The perianal and digital rectal examinations were normal. Pertinent       negatives include normal sphincter tone.      Non-bleeding internal hemorrhoids were found during retroflexion. The       hemorrhoids were Grade I (internal hemorrhoids that do not prolapse).       Estimated blood loss: none.      Multiple small-mouthed diverticula were found in the left colon.       Estimated blood loss: none.      Four sessile polyps were found in the descending colon (2), transverse       colon (1) and ascending colon (1). The polyps were 3 to 6 mm in size.       These polyps were removed with a cold snare. Resection and retrieval       were complete. Estimated blood loss was minimal.      Nine sessile polyps were found in the sigmoid colon x1, descending colon       x 1, transverse colon x6 and cecum x1. The polyps were 1 to 2 mm in       size. These polyps were removed with a jumbo cold forceps. Resection and       retrieval were complete. Estimated blood loss was minimal. To prevent       bleeding after the  polypectomy, one hemostatic clip was successfully       placed (MR conditional). There was no bleeding at the end of the       procedure. Estimated blood loss: none.      A diffuse area of moderate melanosis was found in the entire colon.       Estimated blood loss: none.      The exam was otherwise without abnormality on direct and retroflexion       views of what could be visualized given prep. Impression:            - Preparation of the colon was fair.                        - Non-bleeding internal hemorrhoids.                        - Diverticulosis in the left colon.                        - Four 3 to 6 mm polyps in the descending colon, in                         the transverse colon and in the ascending colon,                         removed with a cold snare. Resected and retrieved.                        - Nine 1 to 2 mm polyps in the sigmoid colon, in the  descending colon, in the transverse colon and in the                         cecum, removed with a jumbo cold forceps. Resected and                         retrieved. Clip (MR conditional) was placed.                        - Melanosis in the colon.                        - The examination was otherwise normal on direct and                         retroflexion views. Recommendation:        - Discharge patient to home.                        - Resume previous diet.                        - Continue present medications.                        - No aspirin, ibuprofen, naproxen, or other                         non-steroidal anti-inflammatory drugs for 5 days after                         polyp removal.                        - Consider watchman device over lifelong                         anticoagulation if possible                        - Await pathology results.                        - Repeat colonoscopy not recommended given age and                         comorbidities.                        -  Return to GI office as previously scheduled.                        - The findings and recommendations were discussed with                         the patient.                        - The findings and recommendations were discussed with                         the patient's family. Procedure Code(s):     ---  Professional ---                        [phone], Colonoscopy, flexible; with removal of                         tumor(s), polyp(s), or other lesion(s) by snare                         technique                        45380, 59, Colonoscopy, flexible; with biopsy, single                         or multiple Diagnosis Code(s):     --- Professional ---                        K64.0, First degree hemorrhoids                        K63.5, Polyp of colon                        K63.89, Other specified diseases of intestine                        D50.9, Iron deficiency anemia, unspecified                        K57.30, Diverticulosis of large intestine without                         perforation or abscess without bleeding CPT copyright 2019 American Medical Association. All rights reserved. The codes documented in this report are preliminary and upon coder review may  be revised to meet current compliance requirements. Attending Participation:      I personally performed the entire procedure. Volney American, DO [name] DO, DO 02/13/2022 1:46:46 PM This report has been signed electronically. Number of Addenda: 0 Note Initiated On: 02/13/2022 12:02 PM Scope Withdrawal Time: 0 hours 31 minutes 15 seconds  Total Procedure Duration: 1 hour 5 minutes 25 seconds  Estimated Blood Loss:  Estimated blood loss was minimal.      [facility]
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
## 2022-02-14 ENCOUNTER — Encounter: Payer: Self-pay | Admitting: Gastroenterology

## 2022-02-14 LAB — SURGICAL PATHOLOGY

## 2022-02-25 ENCOUNTER — Other Ambulatory Visit: Payer: Self-pay | Admitting: Internal Medicine

## 2022-02-25 ENCOUNTER — Ambulatory Visit
Admission: RE | Admit: 2022-02-25 | Discharge: 2022-02-25 | Disposition: A | Discharge status: Home / Self Care | Payer: Medicare Other | Source: Ambulatory Visit | Attending: Internal Medicine | Admitting: Internal Medicine

## 2022-02-25 DIAGNOSIS — M79661 Pain in right lower leg: Secondary | ICD-10-CM | POA: Diagnosis present

## 2022-02-25 DIAGNOSIS — I824Z1 Acute embolism and thrombosis of unspecified deep veins of right distal lower extremity: Secondary | ICD-10-CM

## 2022-02-25 DIAGNOSIS — M7989 Other specified soft tissue disorders: Secondary | ICD-10-CM | POA: Diagnosis present

## 2022-02-25 DIAGNOSIS — I4891 Unspecified atrial fibrillation: Secondary | ICD-10-CM | POA: Diagnosis present

## 2022-03-11 ENCOUNTER — Encounter: Payer: Self-pay | Admitting: Urology

## 2022-03-11 ENCOUNTER — Ambulatory Visit (INDEPENDENT_AMBULATORY_CARE_PROVIDER_SITE_OTHER): Payer: Medicare Other | Admitting: Urology

## 2022-03-11 VITALS — BP 127/76 | HR 76 | Ht 67.0 in | Wt 171.0 lb

## 2022-03-11 DIAGNOSIS — N320 Bladder-neck obstruction: Secondary | ICD-10-CM

## 2022-03-11 DIAGNOSIS — Z8546 Personal history of malignant neoplasm of prostate: Secondary | ICD-10-CM

## 2022-03-11 DIAGNOSIS — K4090 Unilateral inguinal hernia, without obstruction or gangrene, not specified as recurrent: Secondary | ICD-10-CM

## 2022-03-11 DIAGNOSIS — R3989 Other symptoms and signs involving the genitourinary system: Secondary | ICD-10-CM | POA: Diagnosis not present

## 2022-03-11 DIAGNOSIS — N200 Calculus of kidney: Secondary | ICD-10-CM | POA: Diagnosis not present

## 2022-03-11 DIAGNOSIS — N393 Stress incontinence (female) (male): Secondary | ICD-10-CM
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
## 2022-04-24 IMAGING — DX DG SHOULDER 2+V*R*
3 series · 3 of 3 positions shown · non-contrast
Comparison: None.

CLINICAL DATA: Pain. Right upper arm and shoulder pain. No known
injury.

EXAM:
RIGHT SHOULDER - 2+ VIEW

[shoulder axial]
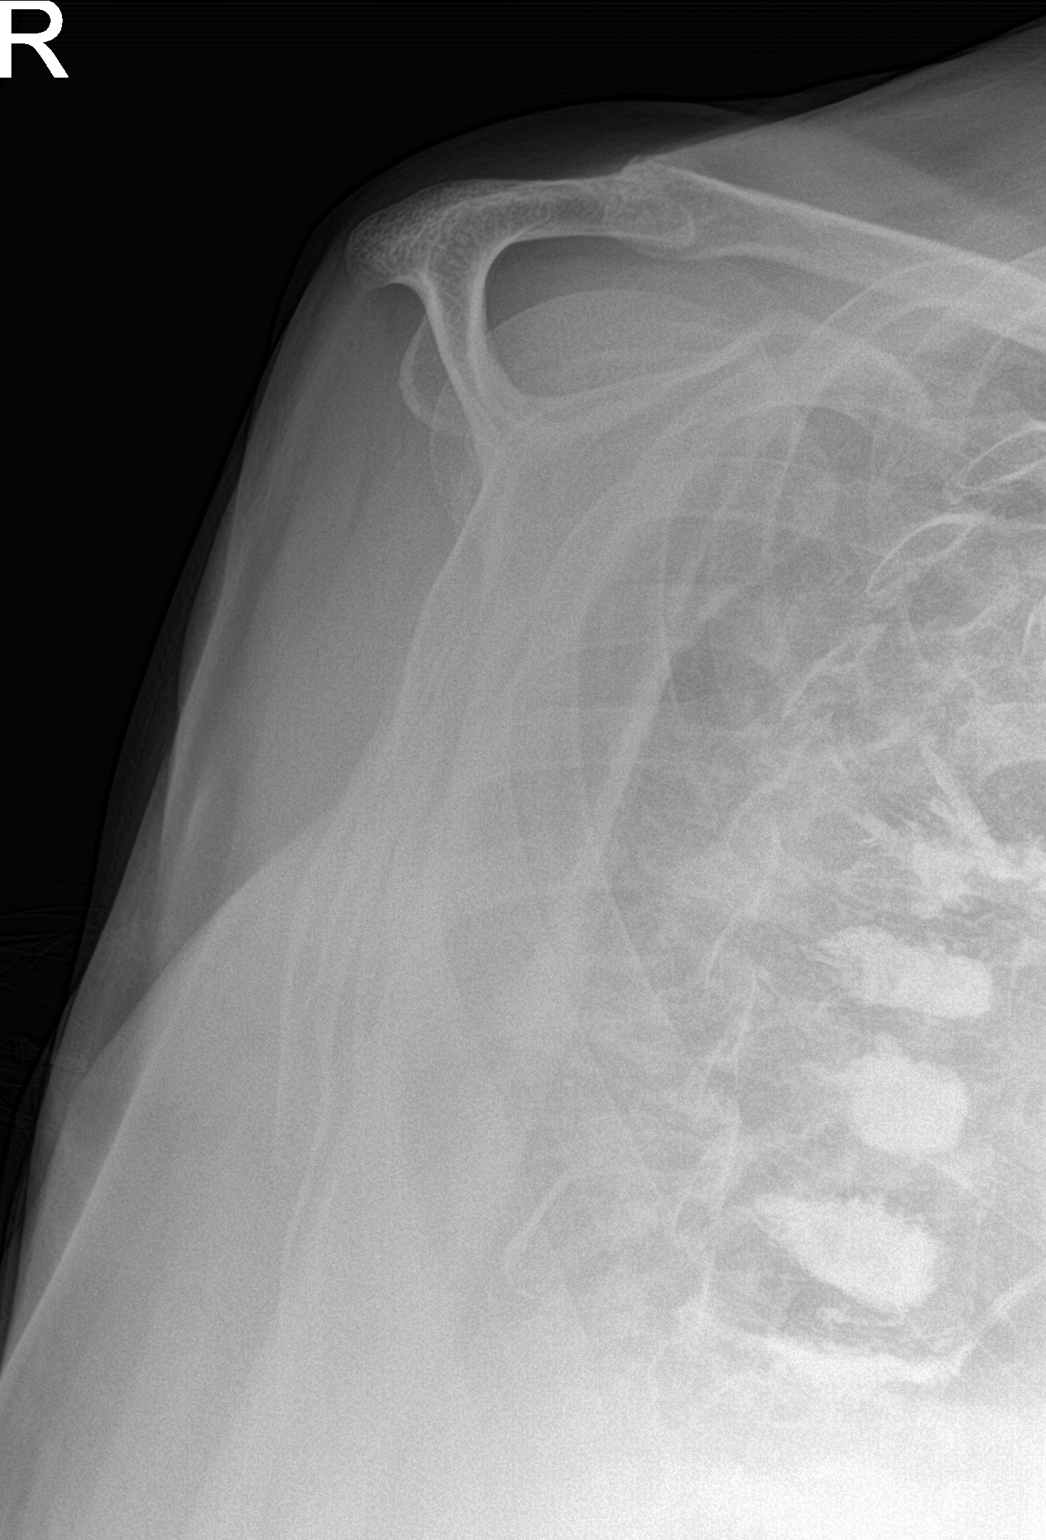

[shoulder ap]
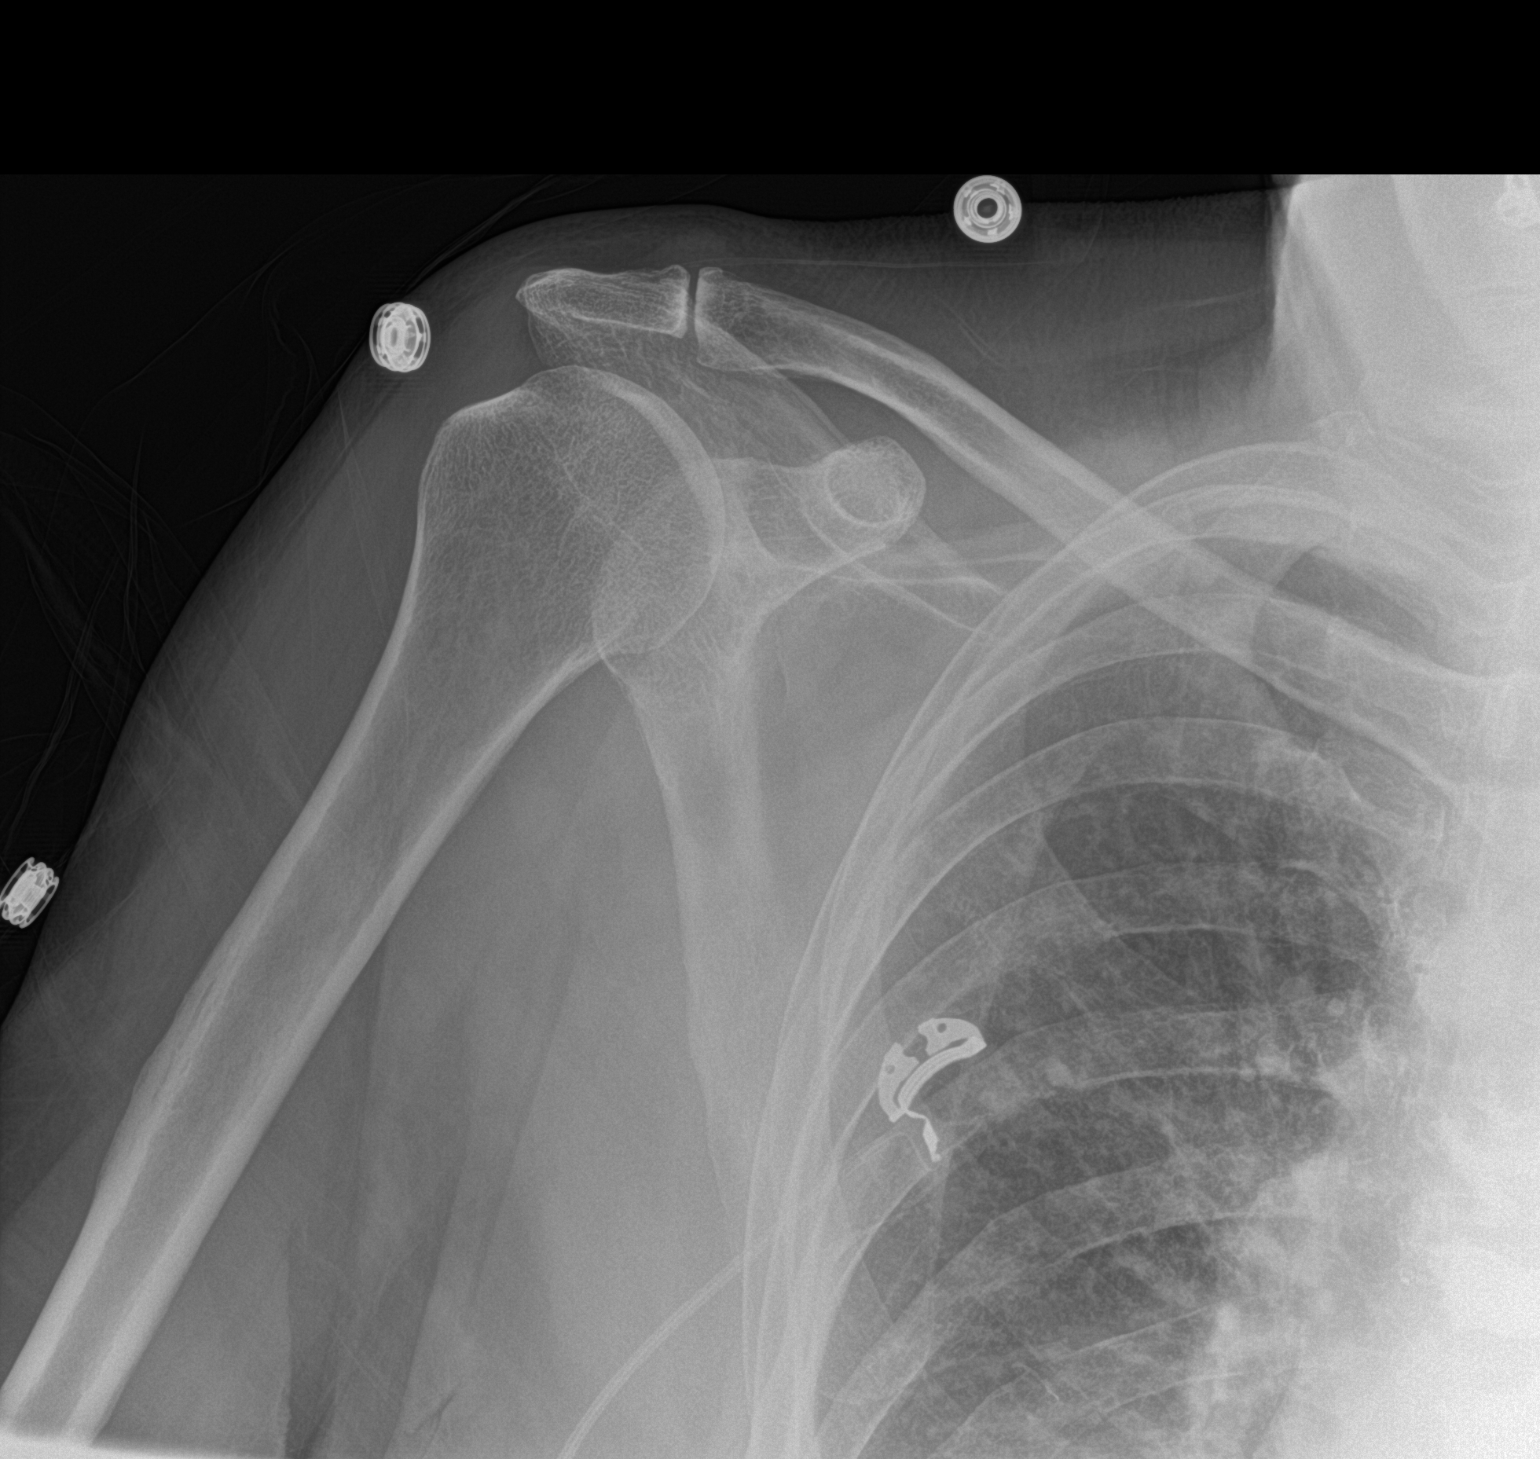

[shoulder obl]
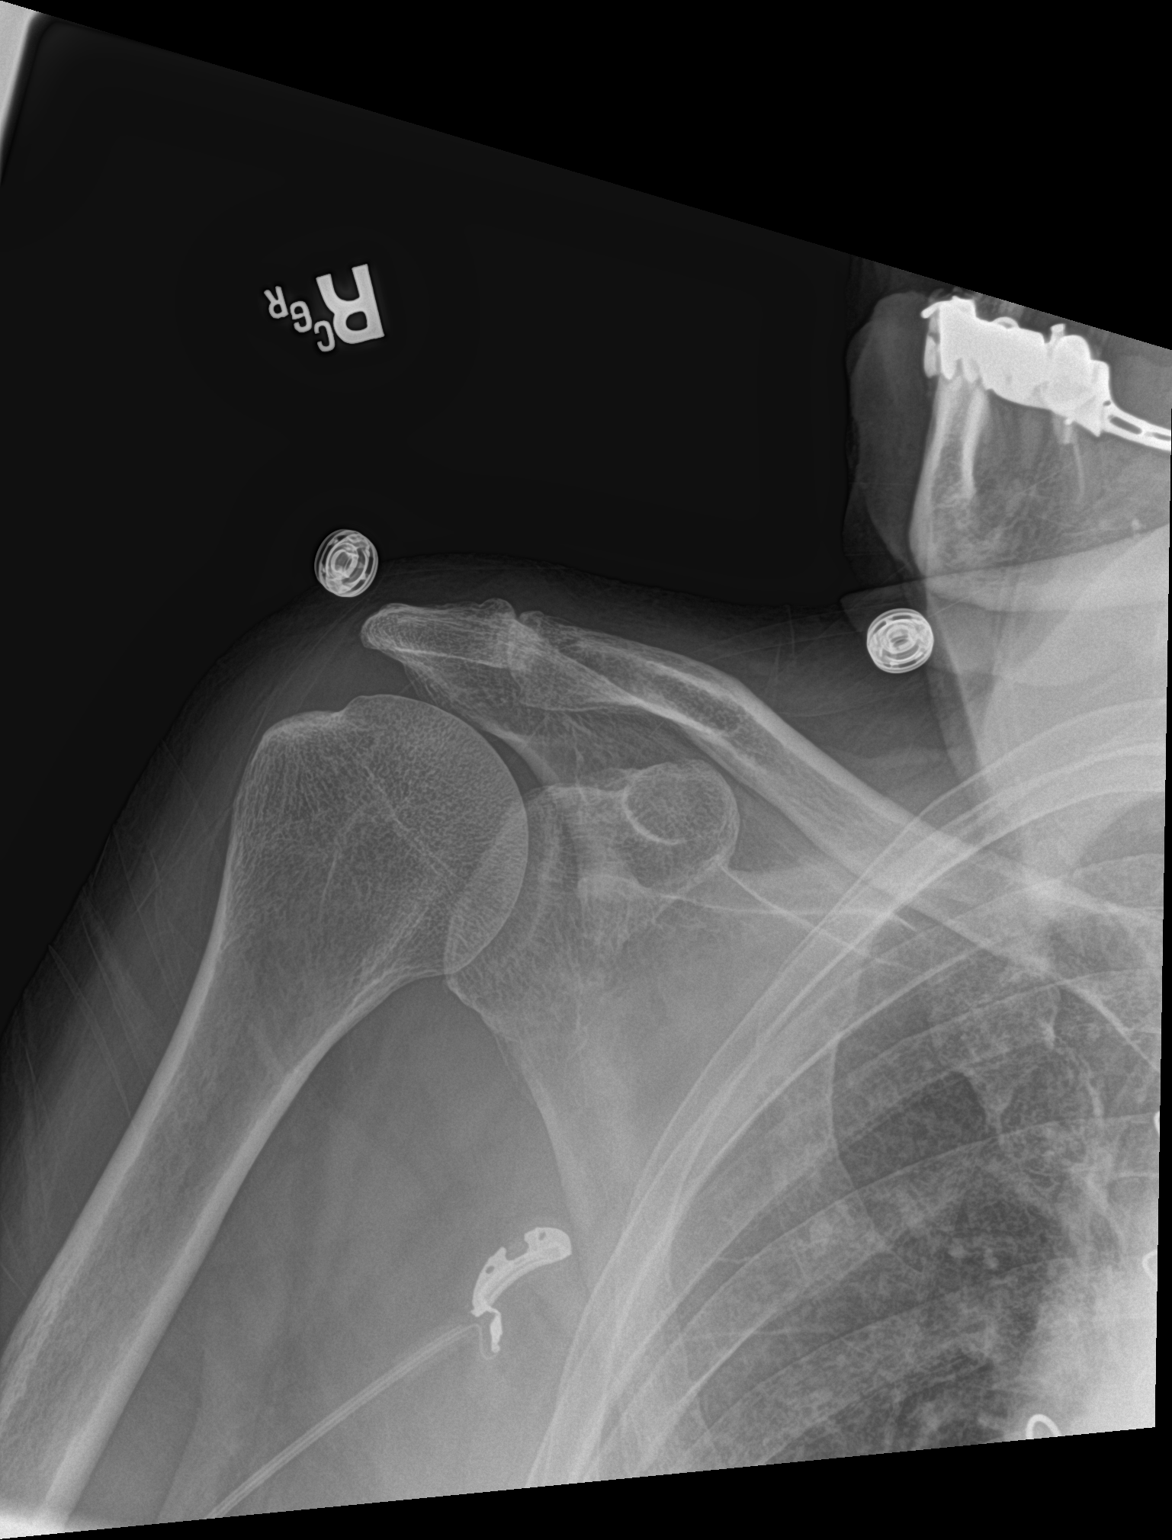

[3 of 3 positions shown; findings below may reference images not displayed]

FINDINGS: There is no evidence of fracture or dislocation. Mild
acromioclavicular spurring. Unremarkable glenohumeral joint. No
erosion, focal bone lesion or bone destruction. Soft tissues are
unremarkable.
IMPRESSION: Mild acromioclavicular osteoarthritis.

## 2022-04-24 IMAGING — DX DG HUMERUS 2V *R*
2 series · 2 of 2 positions shown · non-contrast
Comparison: None.

CLINICAL DATA: Pain. Right upper arm and shoulder pain. No known
injury.

EXAM:
RIGHT HUMERUS - 2+ VIEW

[humerus ap]
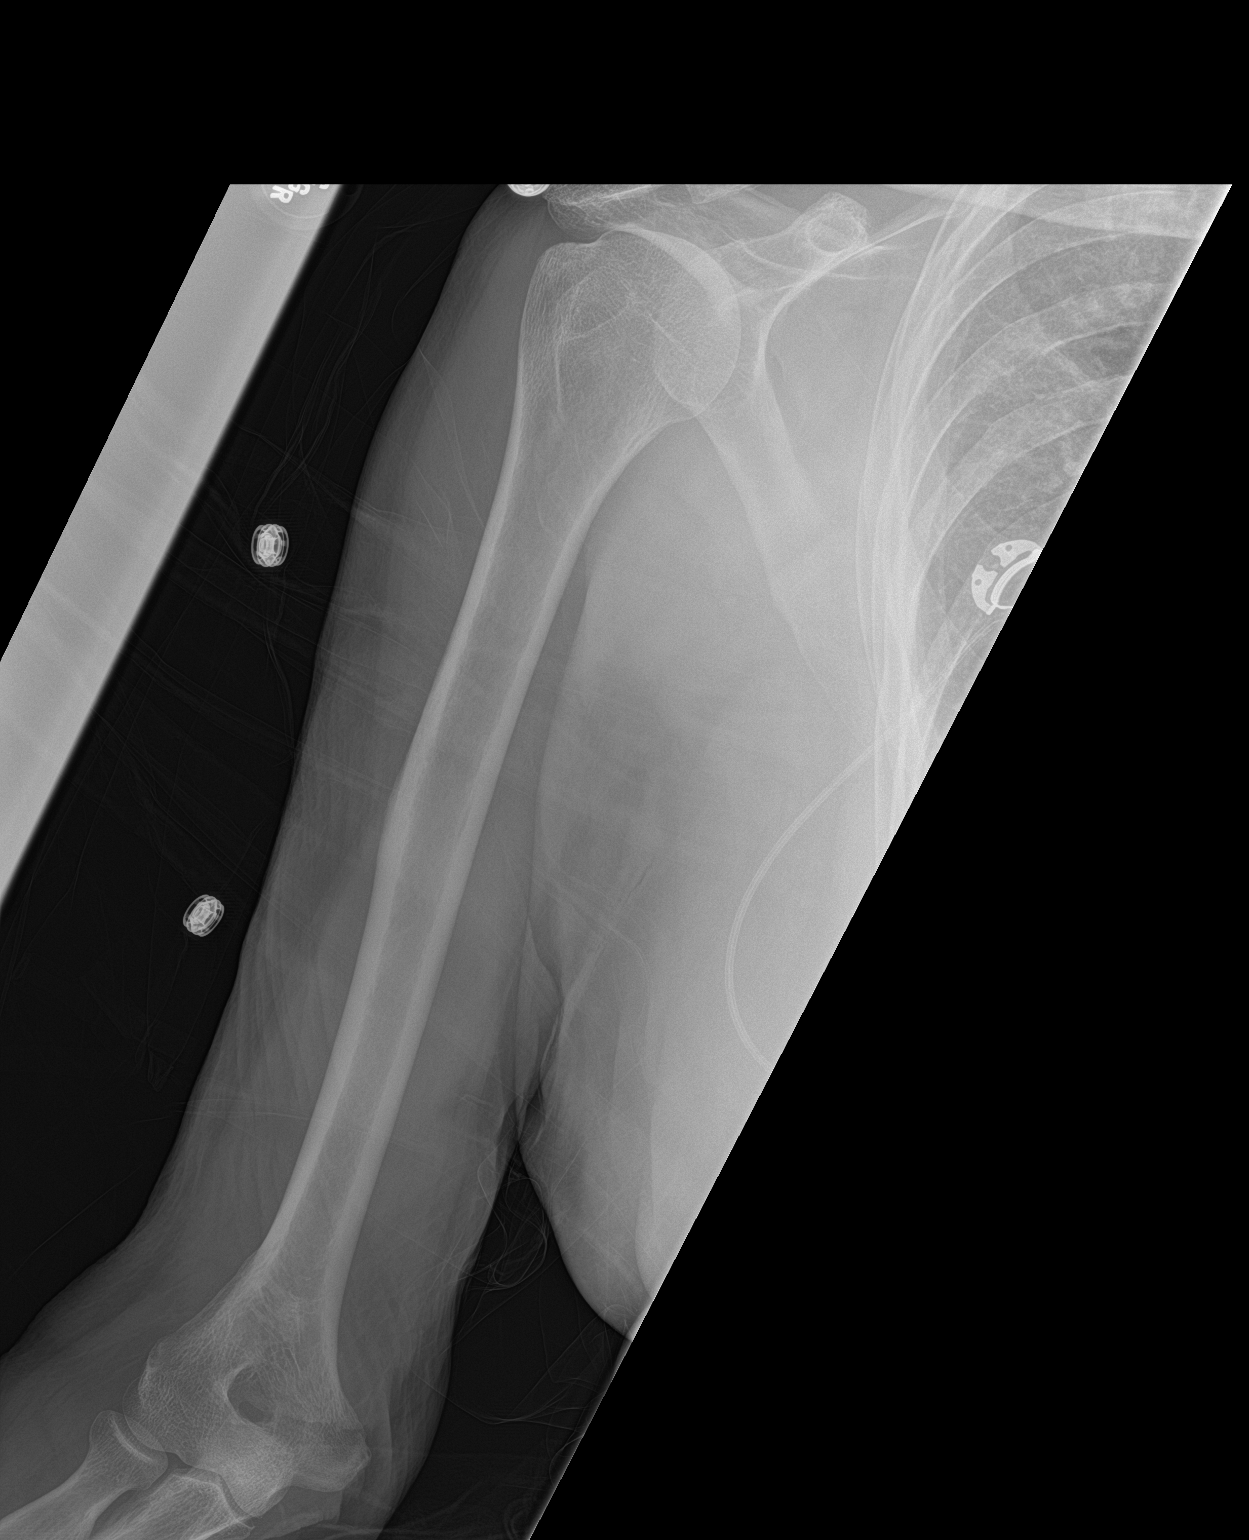

[humerus lat]
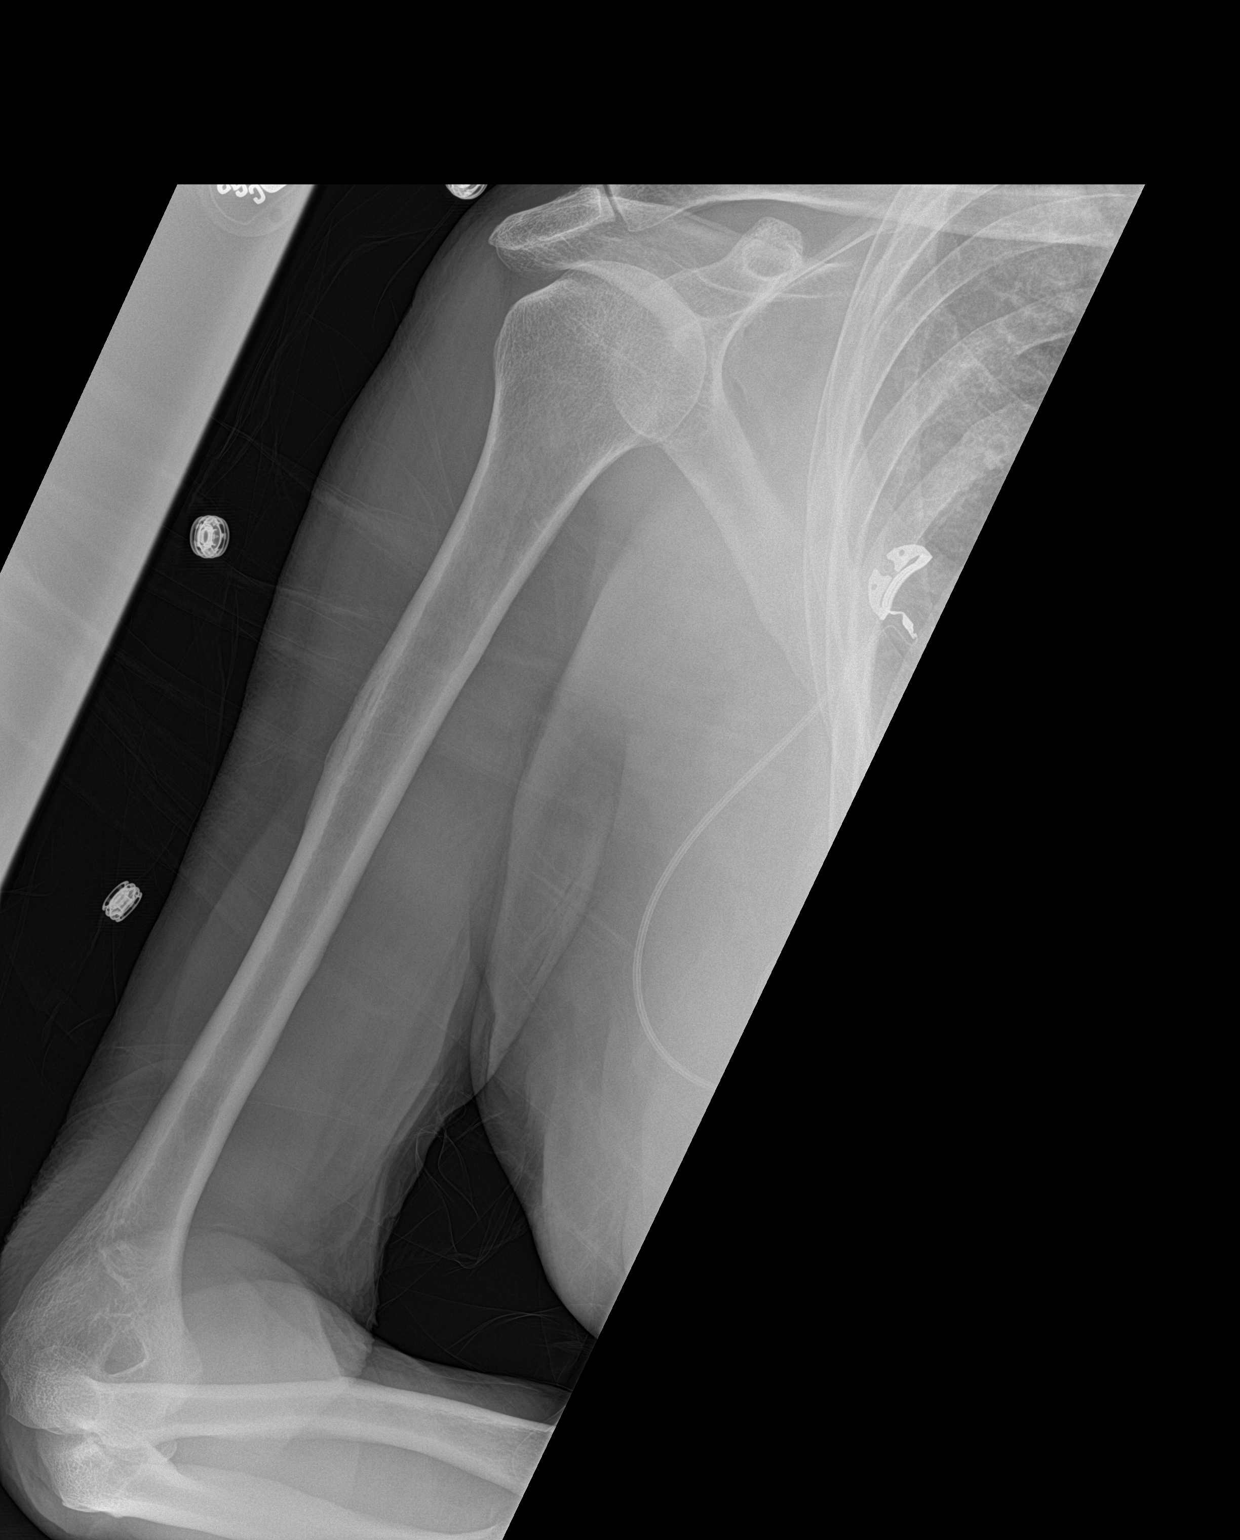

[2 of 2 positions shown; findings below may reference images not displayed]

FINDINGS: Cortical margins of the humerus are intact. There is no evidence of
fracture or other focal bone lesions. Elbow alignment is maintained.
Soft tissues are unremarkable.
IMPRESSION: Negative radiographs of the right humerus.

## 2022-05-08 ENCOUNTER — Other Ambulatory Visit (HOSPITAL_COMMUNITY): Payer: Self-pay | Admitting: Family Medicine

## 2022-05-08 ENCOUNTER — Ambulatory Visit
Admission: RE | Admit: 2022-05-08 | Discharge: 2022-05-08 | Disposition: A | Discharge status: Home / Self Care | Payer: Medicare Other | Source: Ambulatory Visit | Attending: Family Medicine | Admitting: Family Medicine

## 2022-05-08 ENCOUNTER — Other Ambulatory Visit: Payer: Self-pay | Admitting: Family Medicine

## 2022-05-08 DIAGNOSIS — M542 Cervicalgia: Secondary | ICD-10-CM

## 2022-05-08 DIAGNOSIS — G44319 Acute post-traumatic headache, not intractable: Secondary | ICD-10-CM

## 2022-07-01 ENCOUNTER — Telehealth: Payer: Self-pay | Admitting: Neurosurgery

## 2022-07-01 ENCOUNTER — Emergency Department
Admission: EM | Admit: 2022-07-01 | Discharge: 2022-07-01 | Disposition: A | Discharge status: Home / Self Care | Payer: Medicare Other | Attending: Emergency Medicine | Admitting: Emergency Medicine

## 2022-07-01 ENCOUNTER — Emergency Department: Payer: Medicare Other

## 2022-07-01 ENCOUNTER — Other Ambulatory Visit: Payer: Self-pay

## 2022-07-01 DIAGNOSIS — Z8546 Personal history of malignant neoplasm of prostate: Secondary | ICD-10-CM | POA: Insufficient documentation

## 2022-07-01 DIAGNOSIS — S3992XA Unspecified injury of lower back, initial encounter: Secondary | ICD-10-CM | POA: Diagnosis present

## 2022-07-01 DIAGNOSIS — X500XXA Overexertion from strenuous movement or load, initial encounter: Secondary | ICD-10-CM | POA: Insufficient documentation

## 2022-07-01 DIAGNOSIS — J449 Chronic obstructive pulmonary disease, unspecified: Secondary | ICD-10-CM | POA: Insufficient documentation

## 2022-07-01 DIAGNOSIS — S32020A Wedge compression fracture of second lumbar vertebra, initial encounter for closed fracture: Secondary | ICD-10-CM | POA: Diagnosis not present

## 2022-07-01 MED ORDER — HYDROCODONE-ACETAMINOPHEN 5-325 MG PO TABS
1.0000 | ORAL_TABLET | Freq: Once | ORAL | Status: AC
  Administered 2022-07-01: 1 via ORAL
  Filled 2022-07-01: qty 1

## 2022-07-01 MED ORDER — HYDROCODONE-ACETAMINOPHEN 5-325 MG PO TABS: 1.0000 | ORAL_TABLET | Freq: Four times a day (QID) | ORAL | 0 refills | Status: DC | PRN

## 2022-07-01 NOTE — ED Notes (Signed)
See triage note  Presents with lower back pain   States pain started after lifting something heavy  States pain is non radiating

## 2022-07-01 NOTE — Telephone Encounter (Signed)
-----   Message from Geronimo Boot, [place] sent at 07/01/2022  3:36 PM EDT ----- I can see him Friday at 11 if he wants to come in.    ----- Message ----- From: Peggyann Shoals Sent: 07/01/2022   2:51 PM EDT To: Geronimo Boot, PA-C  Can I offer him your Friday slot that's open or not?  ----- Message ----- From: Meade Maw, MD Sent: 07/01/2022   1:46 PM EDT To: Peggyann Shoals  If stacy has an appointment this week, its fine to give him sooner  ----- Message ----- From: Peggyann Shoals Sent: 07/01/2022   1:10 PM EDT To: Meade Maw, MD  Patient said that he thought that he was going to need kyphoplasty. He had a compression fracture in the past and he had to have kyphoplasty. He is in severe pain and can not wait 2 weeks to be seen. He said that he is going to reach out to his PCP Dr.Miller to request referral for a kyphoplasty consult.  ----- Message ----- From: Meade Maw, MD Sent: 07/01/2022   8:27 AM EDT To: Peggyann Shoals  F/u with stacy in 2 weeks ish

## 2022-07-01 NOTE — ED Triage Notes (Signed)
EMS brings pt in from home for c/o back pain, hx chronic back pain, normally takes tramadol for such--last ds 5am; pain has increased since Saturday after bending over

## 2022-07-01 NOTE — Telephone Encounter (Signed)
He declined appt with Marzetta Board because he needs kyphoplasty and if he cannot have it done here there is not reason for him to schedule appt.

## 2022-07-01 NOTE — Progress Notes (Signed)
Orthopedic Tech Progress Note Patient Details:  Justin [name] 1941/04/11 [id number]  Called in order to [employer] for a LSO BRACE   Patient ID: Justin [name], male   DOB: [birth year], 81 y.o.   MRN: [id number]  Janit Pagan 07/01/2022, 9:38 AM

## 2022-07-01 NOTE — Discharge Instructions (Addendum)
Please utilize your lumbar brace whenever out of bed.  You may take it off for brief periods to do things such as bathing and shower.  No driving while taking hydrocodone.  Stop use of tramadol, you should not take this while on hydrocodone.  Please set up close follow-up with both Dr. Sabra Heck as well as Dr. Cari Caraway of neurosurgery.  I recommend you follow-up with neurosurgery within the next 1 to 2 weeks.

## 2022-07-01 NOTE — ED Provider Notes (Signed)
[facility] Provider Note    Event Date/Time   First MD Initiated Contact with Patient 07/01/22 [phone]     (approximate)   History   Back Pain   HPI  Justin [name] is a 81 y.o. male who on review of hospital notes from August 24 has a history of A-fib thrombocytopenia inguinal hernia repair obstructive sleep apnea coronary disease peripheral vascular disease prostate cancer, and COPD on home oxygen 2 to 3 L  No headaches no chest pain or shortness of breath no abdominal pain nausea or vomiting.  Denies any numbness or weakness in the lower extremities.  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  Saturday he lifted a heavy box about 40+ pounds that was delivered to his house as he did so he felt an immediate severe sharp pain in his very low back.  Reports familiar pain feels like when he had fractures in the past.  Called his doctor's office and has an appointment this afternoon with Dr. Sabra Heck, Dr. Sabra Heck was able to prescribe him tramadol and he took 1 last night and one this morning but reports it provides no pain relief.  He also took an ibuprofen yesterday which did help some but he is not supposed to take that on a regular basis  The pain does not radiate to his legs.  It is all located his back.  It is relieved by laying down but trying to bear weight because notable pain in his lower back and knees reports he cannot really walk due to the severity of the pain in his low back when he tries to stand       Physical Exam   Triage Vital Signs: ED Triage Vitals  Enc Vitals Group     BP 07/01/22 0659 108/88     Pulse Rate 07/01/22 0659 77     Resp 07/01/22 0659 16     Temp 07/01/22 0659 97.7 F (36.5 C)     Temp Source 07/01/22 0659 Oral     SpO2 07/01/22 0648 97 %     Weight 07/01/22 0656 165 lb (74.8 kg)      Height 07/01/22 0656 '5\' 7"'$  (1.702 m)     Head Circumference --      Peak Flow --      Pain Score 07/01/22 0655 10     Pain Loc --      Pain Edu? --      Excl. in [place]? --     Most recent vital signs: Vitals:   07/01/22 0648 07/01/22 0659  BP:  108/88  Pulse:  77  Resp:  16  Temp:  97.7 F (36.5 C)  SpO2: 97% 97%     General: Awake, no distress.  Currently resting on his back without distress.  But attempting to sit up he reports notable pain in his lower back pointing towards his lower lumbar spine almost at the near perisacral region. CV:  Good peripheral perfusion.  Resp:  Normal effort.  Abd:  No distention.  Soft nontender nondistended throughout. Other:  No saddle anesthesia.  Moves all extremities well.  Demonstrates the ability to use 5 out of 5 strength in his legs but does induce notable pain in his lower back. Patient able to urinate without difficulty into urinal Warm well-perfused lower extremities strong palpable dorsalis pedis pulses bilateral normal  sensation across the feet No cervical or thoracic tenderness.  Reports moderate to severe tenderness to palpation primarily in the midline low lumbar region without step-off or deformity.  No overlying lesions ED Results / Procedures / Treatments   Labs (all labs ordered are listed, but only abnormal results are displayed) Labs Reviewed - No data to display   EKG     RADIOLOGY  CT Lumbar Spine Wo Contrast  Result Date: 07/01/2022 CLINICAL DATA:  81 year old male with persistent back pain. Exacerbated pain since bending 3 days ago. EXAM: CT LUMBAR SPINE WITHOUT CONTRAST TECHNIQUE: Multidetector CT imaging of the lumbar spine was performed without intravenous contrast administration. Multiplanar CT image reconstructions were also generated. RADIATION DOSE REDUCTION: This exam was performed according to the departmental dose-optimization program which includes automated exposure control, adjustment of the mA and/or  kV according to patient size and/or use of iterative reconstruction technique. COMPARISON:  CT Abdomen and Pelvis 03/29/2021. Lumbar MRI 03/19/2020. FINDINGS: Segmentation: Normal, the same numbering system used on the prior MRI. Alignment: Stable lumbar lordosis since last year. Minor levoconvex scoliosis. No significant spondylolisthesis. Vertebrae: Diffuse osteopenia. Diffuse lower thoracic through L1 vertebral compression fractures and augmentation demonstrated on the CT Abdomen and Pelvis last year. Augmented T12 and L1 levels included today. Subtle L2 vertebral body loss of height (5-10%) when compared to the CT last year and subtle irregularity of the superior endplate. No retropulsion. L2 pedicles and posterior elements appear to remain intact. L3 through L5 vertebrae appear stable and intact. Grossly intact visible sacrum and SI joints. Paraspinal and other soft tissues: Aortoiliac calcified atherosclerosis. Normal caliber abdominal aorta. Chronic left lower pole nephrolithiasis. Large bowel diverticulosis. Paraspinal soft tissues remain within normal limits. Disc levels: Mild for age lumbar spine degeneration and capacious lumbar spinal canal demonstrated in 2021. No significant progression of lumbar degeneration since that time. IMPRESSION: 1. Subtle L2 compression fracture is new since last year. Underlying diffuse osteopenia and extensive chronic, previously augmented thoracic through L1 compression fractures. If specific therapy is desired, Lumbar MRI without contrast or Nuclear Medicine Whole-body Bone Scan would confirm acuity and candidacy for vertebroplasty. 2. Mild for age lumbar spine degeneration and capacious lumbar spinal canal. 3. Aortic Atherosclerosis (ICD10-I70.0). Left nephrolithiasis. Large bowel diverticulosis. Electronically Signed   By: Genevie Ann M.D.   On: 07/01/2022 08:05    Personally discussed patient's clinical case, symptoms, and imaging studies in person with Dr. Cari Caraway in  the ER.  He advises having patient placed into a QuickDraw LSO brace and follow-up in his clinic, plan about 1 to 2 weeks and he will have his scheduler call patient.  PROCEDURES:  Critical Care performed: No  Procedures   MEDICATIONS ORDERED IN ED: Medications  HYDROcodone-acetaminophen (NORCO/VICODIN) 5-325 MG per tablet 1 tablet (1 tablet Oral Given 07/01/22 0753)  HYDROcodone-acetaminophen (NORCO/VICODIN) 5-325 MG per tablet 1 tablet (1 tablet Oral Given 07/01/22 0848)     IMPRESSION / MDM / ASSESSMENT AND PLAN / ED COURSE  I reviewed the triage vital signs and the nursing notes.                              Differential diagnosis includes, but is not limited to, compression fracture, herniated nucleus pulposus, degenerative disc disease, osteoarthritis etc.  Patient appears to have suffered a lifting injury after lifting a 40 pound object sudden immediate pain in his lower back.  Has trialed tramadol without relief, appears to be  in notable pain with movement.  He reports use of hydrocodone in the past has been more helpful, will provide.  Due to his history of previous kyphoplasties etc. and previous back issues and given his location of pain in his lower lumbar region we will proceed with CT scan  Patient's presentation is most consistent with acute complicated illness / injury requiring diagnostic workup.     Clinical Course as of 07/01/22 1131  Tue Jul 01, 2022  0929 Patient resting comfortably awaiting for placement of LSO brace [MQ]  0930 Wife has given here and present at bedside. [MQ]    Clinical Course User Index [MQ] Delman Kitten, MD   Patient resting, currently awaiting arrival of LSO brace.  I discussed return precautions, treatment recommendations, pain control treatment including risks and precautions around hydrocodone, and plan for follow-up with neurosurgery with the patient and his wife were agreeable.  ----------------------------------------- 11:32 AM on  07/01/2022 -----------------------------------------  Patient fully alert well oriented.  Comfortable with plan for discharge and follow-up with neurosurgery.  Discussed return precautions.  Wife driving him home  FINAL CLINICAL IMPRESSION(S) / ED DIAGNOSES   Final diagnoses:  Closed compression fracture of L2 vertebra, initial encounter ([place])     Rx / DC Orders   ED Discharge Orders          Ordered    HYDROcodone-acetaminophen (NORCO/VICODIN) 5-325 MG tablet  Every 6 hours PRN        07/01/22 1131             Note:  This document was prepared using Dragon voice recognition software and may include unintentional dictation errors.   Delman Kitten, MD 07/01/22 [phone]

## 2022-07-01 NOTE — ED Notes (Signed)
TLSO  BRACE ORDER  PER MD

## 2022-07-03 ENCOUNTER — Other Ambulatory Visit: Payer: Self-pay | Admitting: Internal Medicine

## 2022-07-03 ENCOUNTER — Ambulatory Visit
Admission: RE | Admit: 2022-07-03 | Discharge: 2022-07-03 | Disposition: A | Discharge status: Home / Self Care | Payer: Medicare Other | Source: Ambulatory Visit | Attending: Internal Medicine | Admitting: Internal Medicine

## 2022-07-03 DIAGNOSIS — S32020D Wedge compression fracture of second lumbar vertebra, subsequent encounter for fracture with routine healing: Secondary | ICD-10-CM | POA: Diagnosis present

## 2022-07-03 DIAGNOSIS — S32020A Wedge compression fracture of second lumbar vertebra, initial encounter for closed fracture: Secondary | ICD-10-CM

## 2022-07-08 ENCOUNTER — Ambulatory Visit
Admission: RE | Admit: 2022-07-08 | Discharge: 2022-07-08 | Disposition: A | Discharge status: Home / Self Care | Payer: Medicare Other | Source: Ambulatory Visit | Attending: Internal Medicine | Admitting: Internal Medicine

## 2022-07-08 ENCOUNTER — Other Ambulatory Visit: Payer: Self-pay | Admitting: Interventional Radiology

## 2022-07-08 DIAGNOSIS — S32020A Wedge compression fracture of second lumbar vertebra, initial encounter for closed fracture: Secondary | ICD-10-CM

## 2022-07-08 HISTORY — PX: IR RADIOLOGIST EVAL & MGMT: IMG5224
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
## 2022-07-09 NOTE — Discharge Instructions (Signed)
Kyphoplasty Post Procedure Discharge Instructions  May resume a regular diet and any medications that you routinely take (including pain medications). However, if you are taking Aspirin or an anticoagulant/blood thinner you will be told when you can resume taking these by the healthcare provider. No driving day of procedure. The day of your procedure take it easy. You may use an ice pack as needed to injection sites on back.  Ice to back 30 minutes on and 30 minutes off, as needed. May remove bandaids tomorrow after taking a shower. Replace daily with a clean bandaid until healed.  Do not lift anything heavier than a milk jug for 1-2 weeks or determined by your physician.  Follow up with your physician in 2 weeks.    Please contact our office at [phone] for the following symptoms or if you have any questions:  Fever greater than 100 degrees Increased swelling, pain, or redness at injection site. Increased back and/or leg pain New numbness or change in symptoms from before the procedure.    Thank you for visiting [facility].   You may resume eliquis 24 hours after your procedure!

## 2022-07-10 ENCOUNTER — Ambulatory Visit
Admission: RE | Admit: 2022-07-10 | Discharge: 2022-07-10 | Disposition: A | Discharge status: Home / Self Care | Payer: Medicare Other | Source: Ambulatory Visit | Attending: Internal Medicine | Admitting: Internal Medicine

## 2022-07-10 DIAGNOSIS — S32020A Wedge compression fracture of second lumbar vertebra, initial encounter for closed fracture: Secondary | ICD-10-CM

## 2022-07-10 HISTORY — PX: IR KYPHO LUMBAR INC FX REDUCE BONE BX UNI/BIL CANNULATION INC/IMAGING: IMG5519

## 2022-07-10 LAB — CBC
HCT: 43.1 % (ref 38.5–50.0)
Hemoglobin: 15.5 g/dL (ref 13.2–17.1)
MCH: 34.8 pg — ABNORMAL HIGH (ref 27.0–33.0)
MCHC: 36 g/dL (ref 32.0–36.0)
MCV: 96.9 fL (ref 80.0–100.0)
MPV: 11.8 fL (ref 7.5–12.5)
Platelets: 135 10*3/uL — ABNORMAL LOW (ref 140–400)
RBC: 4.45 10*6/uL (ref 4.20–5.80)
RDW: 13 % (ref 11.0–15.0)
WBC: 9.7 10*3/uL (ref 3.8–10.8)

## 2022-07-10 LAB — COMPLETE METABOLIC PANEL WITH GFR
AG Ratio: 1.6 (calc) (ref 1.0–2.5)
ALT: 25 U/L (ref 9–46)
Albumin: 4.6 g/dL (ref 3.6–5.1)
Alkaline phosphatase (APISO): 117 U/L (ref 35–144)
BUN: 12 mg/dL (ref 7–25)
CO2: 23 mmol/L (ref 20–32)
Calcium: 9.2 mg/dL (ref 8.6–10.3)
Chloride: 83 mmol/L — ABNORMAL LOW (ref 98–110)
Creat: 0.74 mg/dL (ref 0.70–1.22)
Globulin: 2.9 g/dL (calc) (ref 1.9–3.7)
Glucose, Bld: 92 mg/dL (ref 65–139)
Sodium: 119 mmol/L — CL (ref 135–146)
Total Bilirubin: 1.2 mg/dL (ref 0.2–1.2)
Total Protein: 7.5 g/dL (ref 6.1–8.1)
eGFR: 91 mL/min/{1.73_m2} (ref >=60)

## 2022-07-10 MED ORDER — SODIUM CHLORIDE 0.9 % IV SOLN: INTRAVENOUS | Status: DC

## 2022-07-10 MED ORDER — IOPAMIDOL (ISOVUE-M 200) INJECTION 41%
10.0000 mL | Freq: Once | INTRAMUSCULAR | Status: AC
  Administered 2022-07-10: 10 mL

## 2022-07-10 MED ORDER — FENTANYL CITRATE PF 50 MCG/ML IJ SOSY
25.0000 ug | PREFILLED_SYRINGE | INTRAMUSCULAR | Status: DC | PRN
  Administered 2022-07-10 (×3): 25 ug via INTRAVENOUS

## 2022-07-10 MED ORDER — CEFAZOLIN SODIUM-DEXTROSE 2-4 GM/100ML-% IV SOLN
2.0000 g | Freq: Once | INTRAVENOUS | Status: AC
  Administered 2022-07-10: 2 g via INTRAVENOUS

## 2022-07-10 MED ORDER — MIDAZOLAM HCL 2 MG/2ML IJ SOLN
1.0000 mg | INTRAMUSCULAR | Status: DC | PRN
  Administered 2022-07-10 (×4): 0.5 mg via INTRAVENOUS

## 2022-07-10 MED ORDER — ACETAMINOPHEN 10 MG/ML IV SOLN
1000.0000 mg | Freq: Once | INTRAVENOUS | Status: AC
  Administered 2022-07-10: 1000 mg via INTRAVENOUS

## 2022-07-10 NOTE — Discharge Instructions (Signed)
Kyphoplasty Post Procedure Discharge Instructions  May resume a regular diet and any medications that you routinely take (including pain medications). However, if you are taking Aspirin or an anticoagulant/blood thinner you will be told when you can resume taking these by the healthcare provider. No driving day of procedure. The day of your procedure take it easy. You may use an ice pack as needed to injection sites on back.  Ice to back 30 minutes on and 30 minutes off, as needed. May remove bandaids tomorrow after taking a shower. Replace daily with a clean bandaid until healed.  Do not lift anything heavier than a milk jug for 1-2 weeks or determined by your physician.  Follow up with your physician in 2 weeks.    Please contact our office at [phone] for the following symptoms or if you have any questions:  Fever greater than 100 degrees Increased swelling, pain, or redness at injection site. Increased back and/or leg pain New numbness or change in symptoms from before the procedure.    Thank you for visiting [facility].   You may restart your Eliquis tomorrow morning at 10:30am.

## 2022-07-15 ENCOUNTER — Inpatient Hospital Stay: Admission: RE | Admit: 2022-07-15 | Payer: Medicare Other | Source: Ambulatory Visit

## 2022-07-17 ENCOUNTER — Telehealth: Payer: Self-pay

## 2022-07-17 NOTE — Telephone Encounter (Signed)
Phone call to pt to follow up from his kyphoplasty on 07/10/22. Pt reports the pain he was feeling before the kyphoplasty is better but it hurts much worse lower in his back. Pt reports he is barely able to put any weight on his legs. Pt denies any signs of infection, redness at the site, draining or fever. RN urged pt. To see a doctor as soon as possible since he has been hurting for bad for a couple days. Pt. Was offered the soones in person appointment on 07/22/22 with Dr. Denna Haggard in person.Pt. advised to call back if anything were to change or any concerns arise and we will arrange an in person appointment. Pt verbalized understanding.

## 2022-07-18 ENCOUNTER — Other Ambulatory Visit: Payer: Self-pay | Admitting: Interventional Radiology

## 2022-07-18 DIAGNOSIS — S32020A Wedge compression fracture of second lumbar vertebra, initial encounter for closed fracture: Secondary | ICD-10-CM

## 2022-07-22 ENCOUNTER — Ambulatory Visit
Admission: RE | Admit: 2022-07-22 | Discharge: 2022-07-22 | Disposition: A | Discharge status: Home / Self Care | Payer: Medicare Other | Source: Ambulatory Visit | Attending: Interventional Radiology | Admitting: Interventional Radiology

## 2022-07-22 DIAGNOSIS — S32020A Wedge compression fracture of second lumbar vertebra, initial encounter for closed fracture: Secondary | ICD-10-CM

## 2022-07-22 HISTORY — PX: IR RADIOLOGIST EVAL & MGMT: IMG5224

## 2022-07-22 NOTE — Progress Notes (Signed)
Interventional Radiology - Clinic Visit, Follow-up Note    History of Present Illness  Justin [name] is a 81 y.o. male who returns today for routine follow-up after L2 kyphoplasty on July 10 2022.  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  Patient returns today for routine follow-up after L2 kyphoplasty on July 10 2022.  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  Today he presents to the clinic with his family, reports that his sharp/severe pain in his mid lower back has improved after kyphoplasty, but now complains of continued/worsening pain of his more lower back/sacrum.  Family reports that he is now having difficulty supporting himself using his walker, and has become significantly less mobile.    They described an overall decrease in his energy/fatigue.  They state that before his recent fall in October, that he was much more active and independent.  He now spends most of his day in a chair/recliner, only getting up to have a bowel movement or get to bed.  He also complains of decreased appetite and nausea.    Past medical and surgical history reviewed. No interval changes. No interval hospitalizations.   Medications  I have reviewed the current medication list. Refer to chart for details. Current Outpatient Medications  Medication Instructions   acetaminophen (TYLENOL) 975 mg, Oral, 3 times daily   acetaminophen-codeine (TYLENOL #3) 300-30 MG tablet 1 tablet, Daily at bedtime   albuterol (PROVENTIL HFA;VENTOLIN HFA) 108 (90 Base) MCG/ACT inhaler 2 puffs, Inhalation, Every 4 hours PRN   allopurinol (ZYLOPRIM) 300 mg, Oral, Daily   aspirin EC 81 mg, Daily   atorvastatin (LIPITOR) 40 mg, Oral, Daily   Calcium Carbonate-Vitamin D3 600-400 MG-UNIT TABS 1 tablet, Oral, 2 times daily   cholecalciferol (VITAMIN D3) 3,000 Units, Oral, Daily   cyanocobalamin (VITAMIN B12) 1,000 mcg,  Oral, Daily   Eliquis 2.5 mg, Oral, 2 times daily   ferrous gluconate (FERGON) 324 mg, Oral, Daily with breakfast   fexofenadine (ALLEGRA) 180 mg, Oral, Daily before breakfast   fluticasone (FLONASE) 50 MCG/ACT nasal spray 2 sprays, Each Nare, Daily at bedtime   gabapentin (NEURONTIN) 300 MG capsule Oral   HYDROcodone-acetaminophen (NORCO/VICODIN) 5-325 MG tablet 1-2 tablets, Oral, Every 6 hours PRN   hydrocortisone (ANUSOL-HC) 25 mg, Rectal, Daily PRN   ibuprofen (ADVIL) 600 mg, Oral, Every 6 hours PRN   isosorbide mononitrate (IMDUR) 60 mg, Oral, Every morning   Lactobacillus (PROBIOTIC ACIDOPHILUS PO) 2 capsules, Oral, Daily   Magnesium 400 mg, Oral, 2 times daily   metoprolol tartrate (LOPRESSOR) 25 mg, 2 times daily   Multiple Vitamins-Minerals (EMERGEN-C VITAMIN C) PACK 1 packet, Daily   niacin (VITAMIN B3) 500 mg, 3 times daily   nitroGLYCERIN (NITROSTAT) 0.4 mg, Sublingual, Every 5 min x3 PRN, If no relief call MD or go to emergency room.   omeprazole (PRILOSEC) 40 MG capsule Oral   polyethylene glycol (MIRALAX / GLYCOLAX) 17 g, Oral, Daily PRN   potassium chloride SA (KLOR-CON) 20 MEQ tablet 20 mEq, Oral, Daily   propranolol (INDERAL) 40 mg, Oral, 2 times daily   senna-docusate (SENOKOT-S) 8.6-50 MG tablet 2 tablets, Oral, 2 times daily   TART CHERRY PO 3,000 mg, Every evening   Tiotropium Bromide-Olodaterol 2.5-2.5 MCG/ACT AERS 2 puffs, Inhalation, Every morning   torsemide (DEMADEX) 80 mg, Oral, Daily, Before breakfast   triamcinolone ointment (KENALOG) 0.1 % Apply to aa's BID up to 2  weeks. Avoid the face, groin, and axilla.   Wheat Dextrin (BENEFIBER DRINK MIX PO) 1 Dose, Oral, BH-each morning   zoledronic acid (RECLAST) 5 mg, Intravenous,  Once      Physical Exam Current Vitals   ( )                    There is no height or weight on file to calculate BMI.  General: Alert and answers questions appropriately. No apparent distress. Cardiac: Regular rate. No  dependent edema. Pulmonary: Normal work of breathing. On room air.   Pertinent Lab Results    Latest Ref Rng & Units 07/08/2022    3:00 PM 08/15/2021    1:23 AM 03/28/2021    9:10 PM  CBC  WBC 3.8 - 10.8 Thousand/uL 9.7  11.4  10.5   Hemoglobin 13.2 - 17.1 g/dL 15.5  10.5  12.5   Hematocrit 38.5 - 50.0 % 43.1  32.6  36.8   Platelets 140 - 400 Thousand/uL 135  122  253       Latest Ref Rng & Units 07/08/2022    3:00 PM 08/15/2021    1:23 AM 03/28/2021    9:10 PM  CMP  Glucose 65 - 139 mg/dL 92  117  122   BUN 7 - 25 mg/dL '12  22  14   '$ Creatinine 0.70 - 1.22 mg/dL 0.74  1.26  1.05   Sodium 135 - 146 mmol/L 119  134  125   Potassium  CANCELED  4.3  3.8   Chloride 98 - 110 mmol/L 83  100  92   CO2 20 - 32 mmol/L '23  26  25   '$ Calcium 8.6 - 10.3 mg/dL 9.2  8.2  8.8   Total Protein 6.1 - 8.1 g/dL 7.5  6.0  7.2   Total Bilirubin 0.2 - 1.2 mg/dL 1.2  1.1  0.8   Alkaline Phos 38 - 126 U/L  67  104   AST  CANCELED  48  33   ALT 9 - 46 U/L 25  48  41      Relevant and/or Recent Imaging: Lumbar XR 07/22/2022  Personally reviewed lumbar radiographs ordered today.  Expected changes status post L2 kyphoplasty without complicating feature a vertebral body augmentation.  No new compression deformity of the lumbar spine identified.  Osteopenia.    Pelvic XR 07/22/2022   Personally reviewed pelvic radiograph order today.  No acute fracture identified.  Osteopenia.    Assessment & Plan Justin [name] is a 81 y.o. 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   The patient reports improvement in focal severe pain of the mid lower back status post kyphoplasty.   However, patient and family both report vague lower back/sacral pain that has significantly decreased the patient's mobility, along with overall weakness and fatigue.  Lumbar and pelvic radiographs ordered  today demonstrated no new acute fracture.  Given that the focal mid lower back pain has improved after kyphoplasty, and no new acute fractures identified on today's exam, recommend that the patient follow-up with his primary care provider.    Plan:  1. Follow up with PCP, has appointment tomorrow 07/23/2022  2. May follow up with me PRN      Total time spent on today's visit was over  40 Minutes, including both face-to-face time and non face-to-face time,  personally spent on review of chart (including labs and relevant imaging), discussing further workup and treatment options, referral to specialist if needed, reviewing outside records if pertinent, answering patient questions, and coordinating care regarding lower back pain as well as management strategy.     Albin Felling, MD  Vascular and Interventional Radiology 07/22/2022 8:27 AM

## 2022-07-23 ENCOUNTER — Other Ambulatory Visit: Payer: Self-pay | Admitting: Internal Medicine

## 2022-07-23 DIAGNOSIS — M533 Sacrococcygeal disorders, not elsewhere classified: Secondary | ICD-10-CM

## 2022-07-25 ENCOUNTER — Ambulatory Visit
Admission: RE | Admit: 2022-07-25 | Discharge: 2022-07-25 | Disposition: A | Discharge status: Home / Self Care | Payer: Medicare Other | Source: Ambulatory Visit | Attending: Internal Medicine | Admitting: Internal Medicine

## 2022-07-25 DIAGNOSIS — M533 Sacrococcygeal disorders, not elsewhere classified: Secondary | ICD-10-CM | POA: Diagnosis present

## 2022-07-25 MED ORDER — GADOBUTROL 1 MMOL/ML IV SOLN
7.5000 mL | Freq: Once | INTRAVENOUS | Status: AC | PRN
  Administered 2022-07-25: 7.5 mL via INTRAVENOUS

## 2022-08-07 ENCOUNTER — Ambulatory Visit: Payer: Medicare Other | Admitting: Dermatology

## 2022-08-13 ENCOUNTER — Ambulatory Visit: Payer: Medicare Other | Admitting: Dermatology

## 2022-09-20 ENCOUNTER — Other Ambulatory Visit: Payer: Self-pay

## 2022-09-20 ENCOUNTER — Observation Stay: Payer: Medicare Other

## 2022-09-20 ENCOUNTER — Inpatient Hospital Stay
Admission: EM | Admit: 2022-09-20 | Discharge: 2022-09-29 | DRG: 516 | Disposition: A | Discharge status: Skilled Nursing Facility | Payer: Medicare Other | Attending: Obstetrics and Gynecology | Admitting: Obstetrics and Gynecology

## 2022-09-20 ENCOUNTER — Emergency Department: Payer: Medicare Other

## 2022-09-20 DIAGNOSIS — S2231XA Fracture of one rib, right side, initial encounter for closed fracture: Secondary | ICD-10-CM | POA: Diagnosis not present

## 2022-09-20 DIAGNOSIS — I482 Chronic atrial fibrillation, unspecified: Secondary | ICD-10-CM | POA: Diagnosis present

## 2022-09-20 DIAGNOSIS — I5022 Chronic systolic (congestive) heart failure: Secondary | ICD-10-CM | POA: Diagnosis present

## 2022-09-20 DIAGNOSIS — Z955 Presence of coronary angioplasty implant and graft: Secondary | ICD-10-CM

## 2022-09-20 DIAGNOSIS — S32038A Other fracture of third lumbar vertebra, initial encounter for closed fracture: Secondary | ICD-10-CM | POA: Diagnosis not present

## 2022-09-20 DIAGNOSIS — I110 Hypertensive heart disease with heart failure: Secondary | ICD-10-CM | POA: Diagnosis present

## 2022-09-20 DIAGNOSIS — I10 Essential (primary) hypertension: Secondary | ICD-10-CM | POA: Diagnosis present

## 2022-09-20 DIAGNOSIS — W19XXXA Unspecified fall, initial encounter: Secondary | ICD-10-CM

## 2022-09-20 DIAGNOSIS — J449 Chronic obstructive pulmonary disease, unspecified: Secondary | ICD-10-CM | POA: Diagnosis present

## 2022-09-20 DIAGNOSIS — S32030A Wedge compression fracture of third lumbar vertebra, initial encounter for closed fracture: Secondary | ICD-10-CM | POA: Diagnosis not present

## 2022-09-20 DIAGNOSIS — I2510 Atherosclerotic heart disease of native coronary artery without angina pectoris: Secondary | ICD-10-CM | POA: Diagnosis present

## 2022-09-20 DIAGNOSIS — Z86008 Personal history of in-situ neoplasm of other site: Secondary | ICD-10-CM

## 2022-09-20 DIAGNOSIS — Z87891 Personal history of nicotine dependence: Secondary | ICD-10-CM

## 2022-09-20 DIAGNOSIS — M810 Age-related osteoporosis without current pathological fracture: Secondary | ICD-10-CM | POA: Diagnosis present

## 2022-09-20 DIAGNOSIS — I252 Old myocardial infarction: Secondary | ICD-10-CM

## 2022-09-20 DIAGNOSIS — M545 Low back pain, unspecified: Secondary | ICD-10-CM | POA: Diagnosis not present

## 2022-09-20 DIAGNOSIS — Z79899 Other long term (current) drug therapy: Secondary | ICD-10-CM

## 2022-09-20 DIAGNOSIS — M109 Gout, unspecified: Secondary | ICD-10-CM | POA: Diagnosis present

## 2022-09-20 DIAGNOSIS — I700 Atherosclerosis of aorta: Secondary | ICD-10-CM | POA: Diagnosis present

## 2022-09-20 DIAGNOSIS — W010XXA Fall on same level from slipping, tripping and stumbling without subsequent striking against object, initial encounter: Secondary | ICD-10-CM | POA: Diagnosis present

## 2022-09-20 DIAGNOSIS — Z7901 Long term (current) use of anticoagulants: Secondary | ICD-10-CM

## 2022-09-20 DIAGNOSIS — Z8546 Personal history of malignant neoplasm of prostate: Secondary | ICD-10-CM

## 2022-09-20 DIAGNOSIS — N400 Enlarged prostate without lower urinary tract symptoms: Secondary | ICD-10-CM | POA: Diagnosis present

## 2022-09-20 DIAGNOSIS — K219 Gastro-esophageal reflux disease without esophagitis: Secondary | ICD-10-CM | POA: Diagnosis present

## 2022-09-20 DIAGNOSIS — G4733 Obstructive sleep apnea (adult) (pediatric): Secondary | ICD-10-CM | POA: Diagnosis present

## 2022-09-20 DIAGNOSIS — Z8042 Family history of malignant neoplasm of prostate: Secondary | ICD-10-CM

## 2022-09-20 DIAGNOSIS — Z9981 Dependence on supplemental oxygen: Secondary | ICD-10-CM

## 2022-09-20 DIAGNOSIS — Y92009 Unspecified place in unspecified non-institutional (private) residence as the place of occurrence of the external cause: Secondary | ICD-10-CM

## 2022-09-20 DIAGNOSIS — K5900 Constipation, unspecified: Secondary | ICD-10-CM | POA: Diagnosis present

## 2022-09-20 DIAGNOSIS — Y92008 Other place in unspecified non-institutional (private) residence as the place of occurrence of the external cause: Secondary | ICD-10-CM

## 2022-09-20 DIAGNOSIS — T502X5A Adverse effect of carbonic-anhydrase inhibitors, benzothiadiazides and other diuretics, initial encounter: Secondary | ICD-10-CM | POA: Diagnosis present

## 2022-09-20 DIAGNOSIS — I739 Peripheral vascular disease, unspecified: Secondary | ICD-10-CM | POA: Diagnosis present

## 2022-09-20 DIAGNOSIS — E876 Hypokalemia: Secondary | ICD-10-CM | POA: Diagnosis present

## 2022-09-20 DIAGNOSIS — J439 Emphysema, unspecified: Secondary | ICD-10-CM | POA: Diagnosis present

## 2022-09-20 DIAGNOSIS — E871 Hypo-osmolality and hyponatremia: Secondary | ICD-10-CM | POA: Diagnosis present

## 2022-09-20 DIAGNOSIS — E785 Hyperlipidemia, unspecified: Secondary | ICD-10-CM | POA: Diagnosis present

## 2022-09-20 DIAGNOSIS — J9610 Chronic respiratory failure, unspecified whether with hypoxia or hypercapnia: Secondary | ICD-10-CM | POA: Diagnosis present

## 2022-09-20 DIAGNOSIS — Z951 Presence of aortocoronary bypass graft: Secondary | ICD-10-CM

## 2022-09-20 DIAGNOSIS — Z85828 Personal history of other malignant neoplasm of skin: Secondary | ICD-10-CM

## 2022-09-20 DIAGNOSIS — Z7951 Long term (current) use of inhaled steroids: Secondary | ICD-10-CM

## 2022-09-20 DIAGNOSIS — R42 Dizziness and giddiness: Secondary | ICD-10-CM | POA: Diagnosis present

## 2022-09-20 LAB — COMPREHENSIVE METABOLIC PANEL
ALT: 23 U/L (ref 0–44)
AST: 32 U/L (ref 15–41)
Albumin: 3.5 g/dL (ref 3.5–5.0)
Alkaline Phosphatase: 112 U/L (ref 38–126)
Anion gap: 9 (ref 5–15)
BUN: 14 mg/dL (ref 8–23)
CO2: 31 mmol/L (ref 22–32)
Calcium: 8.7 mg/dL — ABNORMAL LOW (ref 8.9–10.3)
Chloride: 96 mmol/L — ABNORMAL LOW (ref 98–111)
Creatinine, Ser: 1.01 mg/dL (ref 0.61–1.24)
GFR, Estimated: >60 mL/min (ref >60)
Glucose, Bld: 109 mg/dL — ABNORMAL HIGH (ref 70–99)
Potassium: 3.3 mmol/L — ABNORMAL LOW (ref 3.5–5.1)
Sodium: 136 mmol/L (ref 135–145)
Total Bilirubin: 0.9 mg/dL (ref 0.3–1.2)
Total Protein: 6.6 g/dL (ref 6.5–8.1)

## 2022-09-20 LAB — URINALYSIS, ROUTINE W REFLEX MICROSCOPIC
Bacteria, UA: NONE SEEN
Bilirubin Urine: NEGATIVE
Glucose, UA: NEGATIVE mg/dL
Ketones, ur: NEGATIVE mg/dL
Leukocytes,Ua: NEGATIVE
Nitrite: NEGATIVE
Protein, ur: NEGATIVE mg/dL
Specific Gravity, Urine: 1.004 — ABNORMAL LOW (ref 1.005–1.030)
Squamous Epithelial / HPF: NONE SEEN /HPF (ref 0–5)
pH: 8 (ref 5.0–8.0)

## 2022-09-20 LAB — CBC WITH DIFFERENTIAL/PLATELET
Abs Immature Granulocytes: 0.04 10*3/uL (ref 0.00–0.07)
Basophils Absolute: 0.1 10*3/uL (ref 0.0–0.1)
Basophils Relative: 1 %
Eosinophils Absolute: 0.1 10*3/uL (ref 0.0–0.5)
Eosinophils Relative: 2 %
HCT: 38 % — ABNORMAL LOW (ref 39.0–52.0)
Hemoglobin: 12.5 g/dL — ABNORMAL LOW (ref 13.0–17.0)
Immature Granulocytes: 1 %
Lymphocytes Relative: 15 %
Lymphs Abs: 1.2 10*3/uL (ref 0.7–4.0)
MCH: 33.5 pg (ref 26.0–34.0)
MCHC: 32.9 g/dL (ref 30.0–36.0)
MCV: 101.9 fL — ABNORMAL HIGH (ref 80.0–100.0)
Monocytes Absolute: 1.1 10*3/uL — ABNORMAL HIGH (ref 0.1–1.0)
Monocytes Relative: 14 %
Neutro Abs: 5.3 10*3/uL (ref 1.7–7.7)
Neutrophils Relative %: 67 %
Platelets: 154 10*3/uL (ref 150–400)
RBC: 3.73 MIL/uL — ABNORMAL LOW (ref 4.22–5.81)
RDW: 13.5 % (ref 11.5–15.5)
WBC: 7.8 10*3/uL (ref 4.0–10.5)
nRBC: 0 % (ref 0.0–0.2)

## 2022-09-20 LAB — TROPONIN I (HIGH SENSITIVITY): Troponin I (High Sensitivity): 12 ng/L (ref <18)

## 2022-09-20 LAB — MAGNESIUM: Magnesium: 2.2 mg/dL (ref 1.7–2.4)

## 2022-09-20 LAB — CK: Total CK: 84 U/L (ref 49–397)

## 2022-09-20 LAB — BRAIN NATRIURETIC PEPTIDE: B Natriuretic Peptide: 207.3 pg/mL — ABNORMAL HIGH (ref 0.0–100.0)

## 2022-09-20 MED ORDER — POTASSIUM CHLORIDE CRYS ER 20 MEQ PO TBCR
40.0000 meq | EXTENDED_RELEASE_TABLET | Freq: Once | ORAL | Status: AC
  Administered 2022-09-20: 40 meq via ORAL
  Filled 2022-09-20: qty 2

## 2022-09-20 MED ORDER — OXYCODONE-ACETAMINOPHEN 5-325 MG PO TABS
1.0000 | ORAL_TABLET | ORAL | Status: DC | PRN
  Administered 2022-09-21 – 2022-09-22 (×5): 1 via ORAL
  Filled 2022-09-20 (×5): qty 1

## 2022-09-20 MED ORDER — ACETAMINOPHEN 325 MG PO TABS
650.0000 mg | ORAL_TABLET | Freq: Four times a day (QID) | ORAL | Status: DC | PRN
  Administered 2022-09-20: 650 mg via ORAL
  Filled 2022-09-20: qty 2

## 2022-09-20 MED ORDER — ALLOPURINOL 100 MG PO TABS
300.0000 mg | ORAL_TABLET | Freq: Every day | ORAL | Status: DC
  Administered 2022-09-20 – 2022-09-29 (×10): 300 mg via ORAL
  Filled 2022-09-20 (×8): qty 3
  Filled 2022-09-20: qty 1
  Filled 2022-09-20 (×2): qty 3

## 2022-09-20 MED ORDER — ARFORMOTEROL TARTRATE 15 MCG/2ML IN NEBU
15.0000 ug | INHALATION_SOLUTION | Freq: Two times a day (BID) | RESPIRATORY_TRACT | Status: DC
  Administered 2022-09-21 – 2022-09-29 (×17): 15 ug via RESPIRATORY_TRACT
  Filled 2022-09-20 (×21): qty 2

## 2022-09-20 MED ORDER — LORATADINE 10 MG PO TABS
10.0000 mg | ORAL_TABLET | Freq: Every day | ORAL | Status: DC
  Administered 2022-09-21 – 2022-09-29 (×9): 10 mg via ORAL
  Filled 2022-09-20 (×10): qty 1

## 2022-09-20 MED ORDER — LIDOCAINE 5 % EX PTCH
1.0000 | MEDICATED_PATCH | CUTANEOUS | Status: DC
  Administered 2022-09-20 – 2022-09-29 (×9): 1 via TRANSDERMAL
  Filled 2022-09-20 (×10): qty 1

## 2022-09-20 MED ORDER — PANTOPRAZOLE SODIUM 40 MG PO TBEC
40.0000 mg | DELAYED_RELEASE_TABLET | Freq: Every day | ORAL | Status: DC
  Administered 2022-09-20 – 2022-09-29 (×10): 40 mg via ORAL
  Filled 2022-09-20 (×10): qty 1

## 2022-09-20 MED ORDER — RISAQUAD PO CAPS
1.0000 | ORAL_CAPSULE | Freq: Every day | ORAL | Status: DC
  Administered 2022-09-20 – 2022-09-29 (×10): 1 via ORAL
  Filled 2022-09-20 (×10): qty 1

## 2022-09-20 MED ORDER — HYDROCORTISONE ACETATE 25 MG RE SUPP: 25.0000 mg | Freq: Every day | RECTAL | Status: DC | PRN

## 2022-09-20 MED ORDER — VITAMIN B-12 1000 MCG PO TABS
1000.0000 ug | ORAL_TABLET | Freq: Every day | ORAL | Status: DC
  Administered 2022-09-20 – 2022-09-29 (×10): 1000 ug via ORAL
  Filled 2022-09-20 (×2): qty 1
  Filled 2022-09-20: qty 2
  Filled 2022-09-20 (×8): qty 1

## 2022-09-20 MED ORDER — ISOSORBIDE MONONITRATE ER 30 MG PO TB24
60.0000 mg | ORAL_TABLET | Freq: Every day | ORAL | Status: DC
  Administered 2022-09-21 – 2022-09-29 (×9): 60 mg via ORAL
  Filled 2022-09-20 (×10): qty 2

## 2022-09-20 MED ORDER — EMERGEN-C VITAMIN C PO PACK: 1.0000 | PACK | Freq: Every day | ORAL | Status: DC

## 2022-09-20 MED ORDER — APIXABAN 5 MG PO TABS
5.0000 mg | ORAL_TABLET | Freq: Two times a day (BID) | ORAL | Status: DC
  Administered 2022-09-20 – 2022-09-24 (×8): 5 mg via ORAL
  Filled 2022-09-20 (×8): qty 1

## 2022-09-20 MED ORDER — ALBUTEROL SULFATE (2.5 MG/3ML) 0.083% IN NEBU: 3.0000 mL | INHALATION_SOLUTION | RESPIRATORY_TRACT | Status: DC | PRN

## 2022-09-20 MED ORDER — PROPRANOLOL HCL 40 MG PO TABS
80.0000 mg | ORAL_TABLET | Freq: Every day | ORAL | Status: DC
  Administered 2022-09-21 – 2022-09-29 (×9): 80 mg via ORAL
  Filled 2022-09-20 (×6): qty 2
  Filled 2022-09-20: qty 8
  Filled 2022-09-20 (×2): qty 2

## 2022-09-20 MED ORDER — POLYETHYLENE GLYCOL 3350 17 G PO PACK
17.0000 g | PACK | Freq: Every day | ORAL | Status: DC | PRN
  Administered 2022-09-22 – 2022-09-28 (×4): 17 g via ORAL
  Filled 2022-09-20 (×5): qty 1

## 2022-09-20 MED ORDER — HYDRALAZINE HCL 20 MG/ML IJ SOLN: 5.0000 mg | INTRAMUSCULAR | Status: DC | PRN

## 2022-09-20 MED ORDER — DM-GUAIFENESIN ER 30-600 MG PO TB12
1.0000 | ORAL_TABLET | Freq: Two times a day (BID) | ORAL | Status: DC | PRN
  Administered 2022-09-26: 1 via ORAL
  Filled 2022-09-20: qty 1

## 2022-09-20 MED ORDER — NITROGLYCERIN 0.4 MG SL SUBL: 0.4000 mg | SUBLINGUAL_TABLET | SUBLINGUAL | Status: DC | PRN

## 2022-09-20 MED ORDER — MORPHINE SULFATE (PF) 4 MG/ML IV SOLN
4.0000 mg | Freq: Once | INTRAVENOUS | Status: AC
  Administered 2022-09-20: 4 mg via INTRAVENOUS
  Filled 2022-09-20: qty 1

## 2022-09-20 MED ORDER — OYSTER SHELL CALCIUM/D3 500-5 MG-MCG PO TABS
1.0000 | ORAL_TABLET | Freq: Every day | ORAL | Status: DC
  Administered 2022-09-20 – 2022-09-29 (×10): 1 via ORAL
  Filled 2022-09-20 (×10): qty 1

## 2022-09-20 MED ORDER — MORPHINE SULFATE (PF) 2 MG/ML IV SOLN
2.0000 mg | INTRAVENOUS | Status: DC | PRN
  Administered 2022-09-20 – 2022-09-21 (×3): 2 mg via INTRAVENOUS
  Filled 2022-09-20 (×4): qty 1

## 2022-09-20 MED ORDER — VITAMIN D 25 MCG (1000 UNIT) PO TABS
3000.0000 [IU] | ORAL_TABLET | Freq: Every day | ORAL | Status: DC
  Administered 2022-09-21 – 2022-09-28 (×8): 3000 [IU] via ORAL
  Filled 2022-09-20 (×8): qty 3

## 2022-09-20 MED ORDER — UMECLIDINIUM BROMIDE 62.5 MCG/ACT IN AEPB
1.0000 | INHALATION_SPRAY | Freq: Every day | RESPIRATORY_TRACT | Status: DC
  Administered 2022-09-22 – 2022-09-29 (×8): 1 via RESPIRATORY_TRACT
  Filled 2022-09-20 (×3): qty 7

## 2022-09-20 MED ORDER — ATORVASTATIN CALCIUM 20 MG PO TABS
40.0000 mg | ORAL_TABLET | Freq: Every day | ORAL | Status: DC
  Administered 2022-09-20 – 2022-09-29 (×10): 40 mg via ORAL
  Filled 2022-09-20 (×11): qty 2

## 2022-09-20 MED ORDER — FLUTICASONE PROPIONATE 50 MCG/ACT NA SUSP
2.0000 | Freq: Every day | NASAL | Status: DC
  Administered 2022-09-22 – 2022-09-28 (×5): 2 via NASAL
  Filled 2022-09-20 (×2): qty 16

## 2022-09-20 MED ORDER — PANTOPRAZOLE SODIUM 40 MG PO TBEC: 40.0000 mg | DELAYED_RELEASE_TABLET | Freq: Every day | ORAL | Status: DC

## 2022-09-20 MED ORDER — FERROUS GLUCONATE 324 (38 FE) MG PO TABS
324.0000 mg | ORAL_TABLET | Freq: Every day | ORAL | Status: DC
  Administered 2022-09-21 – 2022-09-29 (×8): 324 mg via ORAL
  Filled 2022-09-20 (×9): qty 1

## 2022-09-20 MED ORDER — METHOCARBAMOL 500 MG PO TABS
500.0000 mg | ORAL_TABLET | Freq: Three times a day (TID) | ORAL | Status: DC | PRN
  Administered 2022-09-20 – 2022-09-29 (×13): 500 mg via ORAL
  Filled 2022-09-20 (×13): qty 1

## 2022-09-20 MED ORDER — ONDANSETRON HCL 4 MG/2ML IJ SOLN
4.0000 mg | Freq: Three times a day (TID) | INTRAMUSCULAR | Status: DC | PRN
  Administered 2022-09-25 – 2022-09-29 (×5): 4 mg via INTRAVENOUS
  Filled 2022-09-20 (×5): qty 2

## 2022-09-20 MED ORDER — ONDANSETRON HCL 4 MG/2ML IJ SOLN
4.0000 mg | Freq: Once | INTRAMUSCULAR | Status: AC
  Administered 2022-09-20: 4 mg via INTRAVENOUS
  Filled 2022-09-20: qty 2

## 2022-09-20 MED ORDER — TORSEMIDE 20 MG PO TABS
40.0000 mg | ORAL_TABLET | Freq: Every day | ORAL | Status: DC
  Administered 2022-09-21 – 2022-09-25 (×5): 40 mg via ORAL
  Filled 2022-09-20 (×6): qty 2

## 2022-09-20 MED ORDER — SENNOSIDES-DOCUSATE SODIUM 8.6-50 MG PO TABS
1.0000 | ORAL_TABLET | Freq: Two times a day (BID) | ORAL | Status: DC
  Administered 2022-09-20 – 2022-09-22 (×5): 1 via ORAL
  Filled 2022-09-20 (×5): qty 1

## 2022-09-20 MED ORDER — GABAPENTIN 300 MG PO CAPS
300.0000 mg | ORAL_CAPSULE | Freq: Every day | ORAL | Status: DC
  Administered 2022-09-20 – 2022-09-28 (×9): 300 mg via ORAL
  Filled 2022-09-20 (×9): qty 1

## 2022-09-20 MED ORDER — ENOXAPARIN SODIUM 40 MG/0.4ML IJ SOSY: 40.0000 mg | PREFILLED_SYRINGE | INTRAMUSCULAR | Status: DC

## 2022-09-20 NOTE — Progress Notes (Signed)
Orthopedic Tech Progress Note Patient Details:  Justin [name] 06/14/1941 [id number] LSO Brace has been ordered from [facility].  Patient ID: Justin [name], male   DOB: 1941/07/30, 81 y.o.   MRN: [id number]  [name] 09/20/2022, 12:39 PM

## 2022-09-20 NOTE — ED Notes (Signed)
Pt transported to CT ?

## 2022-09-20 NOTE — ED Notes (Signed)
Ortho called to place order for LSO brace.

## 2022-09-20 NOTE — ED Triage Notes (Signed)
Pt comes in via [facility] from home with complaints of a fall on 12/27. Pt is complaining of pain in his lower back an bilateral legs. Pt is alert and oriented x4. Pt is chronically on 2L of oxygen, currently saturating at 94%. Pt isn't visually in any distress at this time.

## 2022-09-20 NOTE — H&P (Signed)
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 discharge back to previous environment  Consults called:  Dr. Tamala Julian of neurosurgery is consulted.  Admission status and Level of care: Telemetry Medical   for obs  Dispo: The patient is from: Home              Anticipated d/c is to: Home              Anticipated d/c date is: 1 day              Patient currently is not medically stable to d/c.    Severity of Illness:  The appropriate patient status for this patient is OBSERVATION. Observation status is judged to be reasonable and necessary in order to provide the required intensity of  service to ensure the patient's safety. The patient's presenting symptoms, physical exam findings, and initial radiographic and laboratory data in the context of their medical condition is felt to place them at decreased risk for further clinical deterioration. Furthermore, it is anticipated that the patient will be medically stable for discharge from the hospital within 2 midnights of admission.        Date of Service 09/20/2022    [facility]   If 7PM-7AM, please contact night-coverage [email or web address] 09/20/2022, 1:37 PM

## 2022-09-20 NOTE — ED Notes (Signed)
Pt back from CT

## 2022-09-20 NOTE — ED Notes (Signed)
Received Notice via secure chat from [facility], NT that's patients back brace has been ordered.

## 2022-09-20 NOTE — ED Provider Notes (Signed)
[facility] Provider Note    Event Date/Time   First MD Initiated Contact with Patient 09/20/22 1012     (approximate)   History   Fall   HPI  AIRIK GOODLIN is a 81 y.o. 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  The patient states that he lost his balance and fell into the corner between the wall and floor.  He has pain to the sacral/coccyx area as well as to the right shoulder.  He states he also hit his head but does not have any headache or dizziness at this time.  He states that initially for the first day after the injury he was able to get up and walk around, but over the last 2 days the pain increased and his ability to function decreased.  He was unable to get out of bed this morning due to the pain.  The patient denies fever or chills, vomiting or diarrhea, chest pain, difficulty breathing more than baseline, or any other acute symptoms.  I reviewed the past medical records.  The patient was last seen in the ED on 10/10 for low back pain.  CT showed an L2 compression fracture and the patient was discharged with an LSO brace.  He had a kyphoplasty of L2 on 10/19 and his most recent outpatient encounter was on 10/31 for follow-up with interventional radiology.  He has a history of multiple thoracic and lumbar fractures treated with vertebral body augmentation.   Physical Exam   Triage Vital Signs: ED Triage Vitals  Enc Vitals Group     BP      Pulse      Resp      Temp      Temp src      SpO2      Weight      Height      Head Circumference      Peak Flow      Pain Score      Pain Loc      Pain Edu?      Excl. in [place]?     Most recent vital signs: Vitals:   09/20/22 1030 09/20/22 1205  BP:  97/68  Pulse: 90 88  Resp: 16 13  Temp:    SpO2: 95% 98%      General: Alert and oriented, somewhat frail appearing but in no acute distress. CV:  Good peripheral perfusion.   Resp:  Normal effort.  Abd:  No distention.  Other:  Mild bilateral cervical paraspinal tenderness with no midline tenderness.  No significant thoracic midline tenderness.  Sacral area tenderness with no step-off or crepitus.  No tenderness to the coccyx.  Full range of motion of both hips.  5/5 motor strength and intact sensation of bilateral lower extremities.  2+ DP pulses.  Normal cap refill.  Pain on range of motion of right shoulder with no significant decrease in range.  2+ radial pulse.   ED Results / Procedures / Treatments   Labs (all labs ordered are listed, but only abnormal results are displayed) Labs Reviewed  COMPREHENSIVE METABOLIC PANEL - Abnormal; Notable for the following components:      Result Value   Potassium 3.3 (*)    Chloride 96 (*)    Glucose, Bld 109 (*)  Calcium 8.7 (*)    All other components within normal limits  CBC WITH DIFFERENTIAL/PLATELET - Abnormal; Notable for the following components:   RBC 3.73 (*)    Hemoglobin 12.5 (*)    HCT 38.0 (*)    MCV 101.9 (*)    Monocytes Absolute 1.1 (*)    All other components within normal limits  URINALYSIS, ROUTINE W REFLEX MICROSCOPIC - Abnormal; Notable for the following components:   Color, Urine STRAW (*)    APPearance CLEAR (*)    Specific Gravity, Urine 1.004 (*)    Hgb urine dipstick SMALL (*)    All other components within normal limits  CK  BRAIN NATRIURETIC PEPTIDE  MAGNESIUM  TROPONIN I (HIGH SENSITIVITY)     EKG  ED ECG REPORT I, Arta Silence, the attending physician, personally viewed and interpreted this ECG.  Date: 09/20/2022 EKG Time: 1030 Rate: 89 Rhythm: normal sinus rhythm QRS Axis: normal Intervals: normal ST/T Wave abnormalities: Nonspecific ST abnormalities Narrative Interpretation: Nonspecific abnormalities with no evidence of acute  ischemia    RADIOLOGY  XR R shoulder: I independently viewed and interpreted the images; there is no acute fracture  CT cervical spine: No acute fracture CT thoracic spine: No acute fracture CT lumbar spine: L3 compression fracture   PROCEDURES:  Critical Care performed: No  Procedures   MEDICATIONS ORDERED IN ED: Medications  potassium chloride SA (KLOR-CON M) CR tablet 40 mEq (has no administration in time range)  ondansetron (ZOFRAN) injection 4 mg (has no administration in time range)  acetaminophen (TYLENOL) tablet 650 mg (has no administration in time range)  hydrALAZINE (APRESOLINE) injection 5 mg (has no administration in time range)  albuterol (PROVENTIL) (2.5 MG/3ML) 0.083% nebulizer solution 3 mL (has no administration in time range)  dextromethorphan-guaiFENesin (MUCINEX DM) 30-600 MG per 12 hr tablet 1 tablet (has no administration in time range)  morphine (PF) 4 MG/ML injection 4 mg (4 mg Intravenous Given 09/20/22 1227)  ondansetron (ZOFRAN) injection 4 mg (4 mg Intravenous Given 09/20/22 1227)     IMPRESSION / MDM / ASSESSMENT AND PLAN / ED COURSE  I reviewed the triage vital signs and the nursing notes.  81 year old male with PMH as noted above presents with worsening sacral area pain after a fall 3 days ago, now causing him to be unable to get up, walk, or complete ADLs.  He normally is able to function independently using a walker sometimes.  Differential diagnosis includes, but is not limited to, contusion versus fracture of the lumbar or sacral spine.  There is no evidence of hip or lower extremity injury.  There is no evidence of cauda equina syndrome or other acute neurologic deficit.  Given the patient's age, history of multiple prior fractures, and the mechanism, I have also obtain CTs of the cervical and thoracic spine to rule out fractures there.  Since the patient initially was able to walk after the injury, but then his symptoms worsened  differential also includes rhabdomyolysis, dehydration, AKI, hyperglycemia, other metabolic disturbance, UTI, or less likely cardiac cause.  There is no evidence of significant head injury.  The patient states he did hit his head but is now 3 days later and he has a nonfocal neurologic exam and no headache or other symptoms concerning for TBI.  Patient's presentation is most consistent with acute presentation with potential threat to life or bodily function.  The patient is on the cardiac monitor to evaluate for evidence of arrhythmia and/or significant heart rate changes.  -----------------------------------------  12:55 PM on 09/20/2022 -----------------------------------------  CT shows an L3 compression fracture but no other acute findings.  X-ray of the right shoulder is negative.  Lab workup is overall unremarkable with no leukocytosis on the CBC, normal electrolytes, and negative troponin, CK, and urinalysis.  The patient was having significant pain requiring IV medication for pain control.  Given this he will need admission for pain control and rehab.  I ordered an LSO brace.  I consulted Dr. Tamala Julian from neurosurgery and discussed the case with him; he agrees with the current management and will round on the patient.  I then consulted Dr. Blaine Hamper from the hospitalist service; based on her discussion he agrees to admit the patient.    FINAL CLINICAL IMPRESSION(S) / ED DIAGNOSES   Final diagnoses:  Acute midline low back pain without sciatica  Closed compression fracture of L3 lumbar vertebra, initial encounter ([place])     Rx / DC Orders   ED Discharge Orders     None        Note:  This document was prepared using Dragon voice recognition software and may include unintentional dictation errors.    Arta Silence, MD 09/20/22 1258

## 2022-09-20 NOTE — ED Notes (Signed)
RN at bedside. Pt requesting pain medication for 10/10 lower back pain. MD made aware. See new orders.

## 2022-09-21 ENCOUNTER — Observation Stay: Payer: Medicare Other

## 2022-09-21 DIAGNOSIS — S2231XA Fracture of one rib, right side, initial encounter for closed fracture: Secondary | ICD-10-CM | POA: Diagnosis not present

## 2022-09-21 DIAGNOSIS — S32030A Wedge compression fracture of third lumbar vertebra, initial encounter for closed fracture: Secondary | ICD-10-CM | POA: Diagnosis not present

## 2022-09-21 DIAGNOSIS — I5022 Chronic systolic (congestive) heart failure: Secondary | ICD-10-CM | POA: Diagnosis not present

## 2022-09-21 LAB — BASIC METABOLIC PANEL
Anion gap: 8 (ref 5–15)
BUN: 11 mg/dL (ref 8–23)
CO2: 33 mmol/L — ABNORMAL HIGH (ref 22–32)
Calcium: 9 mg/dL (ref 8.9–10.3)
Chloride: 99 mmol/L (ref 98–111)
Creatinine, Ser: 0.99 mg/dL (ref 0.61–1.24)
GFR, Estimated: >60 mL/min (ref >60)
Glucose, Bld: 102 mg/dL — ABNORMAL HIGH (ref 70–99)
Potassium: 3.5 mmol/L (ref 3.5–5.1)
Sodium: 140 mmol/L (ref 135–145)

## 2022-09-21 MED ORDER — POTASSIUM CHLORIDE CRYS ER 20 MEQ PO TBCR
40.0000 meq | EXTENDED_RELEASE_TABLET | Freq: Once | ORAL | Status: AC
  Administered 2022-09-21: 40 meq via ORAL
  Filled 2022-09-21: qty 2

## 2022-09-21 NOTE — Progress Notes (Signed)
  Progress Note   Patient: Justin [name] VXB:[id number] DOB: 03-08-1941 DOA: 09/20/2022     0 DOS: the patient was seen and examined on 09/21/2022   Brief hospital course: REEF ACHTERBERG is a 81 y.o. male with medical history significant of hypertension, hyperlipidemia, COPD on 2 L oxygen, CAD, CABG, systolic CHF with EF of 33-00%, gout, GERD, prostate cancer, BPH, PAD, anemia, L2 compression fracture (S/p of kyphoplasty of L2), s/p of augmented compression fractures T7 through T12, who presents with fall and back pain.  Patient has a chronic vertigo, he became very dizzy prior to the fall.  He sustained a L3 compression fracture.  He is admitted to hospital for further monitoring.  Assessment and Plan: Closed compression fracture of L3 vertebrae. Rib fracture. Fall due to vertigo Chronic vertigo. Patient still have significant pain in the back as well as bilateral legs.  X-ray of the legs did not show any acute changes.  Patient still require pain medicine.  LSO placed. Patient still pending neurosurgery consult. Patient has been seen by PT, recommend home with home care.  Most likely will discharge to home if her pain is better controlled and no surgery is needed.  Chronic atrial fibrillation. Continue beta-blocker and Eliquis.  Essential hypertension. Continue beta-blocker.  Chronic systolic congestive heart failure. BNP minimally elevated, no evidence of exacerbation.  COPD. Continue home treatment, no bronchospasm.  Hypokalemia. Received potassium, potassium today 3.5, K+ patient kcl 40 mEq orally.     Subjective:  Patient has some pain in the back and bilateral legs.  Able to walk with physical therapy.  Physical Exam: Vitals:   09/21/22 0458 09/21/22 0500 09/21/22 0752 09/21/22 0840  BP: 121/81   (!) 146/75  Pulse: 80   85  Resp: 16   17  Temp: 97.7 F (36.5 C)   97.8 F (36.6 C)  TempSrc: Oral     SpO2: 97%  95% 96%  Weight:  67.8 kg    Height:       General  exam: Appears calm and comfortable  Respiratory system: Clear to auscultation. Respiratory effort normal. Cardiovascular system: S1 & S2 heard, RRR. No JVD, murmurs, rubs, gallops or clicks. No pedal edema. Gastrointestinal system: Abdomen is nondistended, soft and nontender. No organomegaly or masses felt. Normal bowel sounds heard. Central nervous system: Alert and oriented. No focal neurological deficits. Extremities: Symmetric 5 x 5 power. Skin: No rashes, lesions or ulcers Psychiatry: Judgement and insight appear normal. Mood & affect appropriate.   Data Reviewed:  CT, x-ray and lab results reviewed.  Family Communication: None  Disposition: Status is: Observation   Planned Discharge Destination: Home with Home Health    Time spent: 35 minutes  Author: Sharen Hones, MD 09/21/2022 12:43 PM  For on call review [email or web address].

## 2022-09-21 NOTE — Evaluation (Signed)
Occupational Therapy Evaluation Patient Details Name: Justin [name] MRN: [id number] DOB: 1941-01-31 Today's Date: 09/21/2022   History of Present Illness Justin [name] is a 81 y.o. male with medical history significant of hypertension, hyperlipidemia, COPD on 2 L oxygen, CAD, CABG, systolic CHF with EF of 29-52%, gout, GERD, prostate cancer, BPH, PAD, anemia, L2 compression fracture (S/p of kyphoplasty of L2), s/p of augmented compression fractures T7 through T12, who presents with fall and back pain.     The patient states that he lost his balance and fell into the corner between the wall and floor on 3 days ago. No LOC. He states he also hit his head but does not have headache or neck pain at this time. He developed pain in lower back, right rib cage,right shoulder. The pain he lower back is the worst, which is constant, severe, radiating to both legs bilaterally. Patient states that initially he was able to get up and walk around, but in the past 2 days, he has worsening pain, now is unable to get out of bed this morning due to severe pain. X-Ray revealed closed compression Fx L3.   Clinical Impression   [name] presents with generalized weakness, limited endurance, impaired balance, and severe pain. He lives with his wife, no need to use stairs in house, has been largely IND in B/IADL since having back surgery in October of this year, ambulating without AD or with use of SPC or RW, PRN. During today's session, pt endorses 10/10 pain with bed mobility and with standing, requires Mod A for repositioning in bed, Min A for coming into standing, Mod-Max A for maintaining standing balance, Max A for UB and LB dressing. Pt endorses 5/10 pain lying flat/supine in bed, 10/10 pain with any movement. Pt unable to take any steps. He reports he feels in worse pain than earlier today, when he saw PT. Pt demonstrates limited shoulder ROM in R UE. Pt is still waiting on neuro consult. Will defer DC recommendations  until after that, but, at this point, if pt is to discharge from hospital, would have to recommend SNF, as pt cannot complete basic OOB mobility without Mod-Max A. Wife, present in room throughout session, is unable to provide that level of assistance.    Recommendations for follow up therapy are one component of a multi-disciplinary discharge planning process, led by the attending physician.  Recommendations may be updated based on patient status, additional functional criteria and insurance authorization.   Follow Up Recommendations  Follow physician's recommendations for discharge plan and follow up therapies     Assistance Recommended at Discharge Frequent or constant Supervision/Assistance  Patient can return home with the following A lot of help with walking and/or transfers;A lot of help with bathing/dressing/bathroom;Assistance with cooking/housework;Assist for transportation;Help with stairs or ramp for entrance    Functional Status Assessment  Patient has had a recent decline in their functional status and demonstrates the ability to make significant improvements in function in a reasonable and predictable amount of time.  Equipment Recommendations  BSC/3in1    Recommendations for Other Services       Precautions / Restrictions Precautions Precautions: Fall Precaution Comments: HX OF VERTIGO. Required Braces or Orthoses: Spinal Brace Restrictions Weight Bearing Restrictions: No Other Position/Activity Restrictions: Brace when OOB.      Mobility Bed Mobility Overal bed mobility: Needs Assistance Bed Mobility: Rolling, Sidelying to Sit, Sit to Sidelying Rolling: Min assist Sidelying to sit: Mod assist     Sit  to sidelying: Mod assist General bed mobility comments: slow, w/ severe pain, heavy assist with elevating LE to bed level, heavy assist for trunk control    Transfers Overall transfer level: Needs assistance Equipment used: Rolling walker (2  wheels) Transfers: Sit to/from Stand Sit to Stand: Min assist, From elevated surface                  Balance Overall balance assessment: Needs assistance Sitting-balance support: Feet supported, Bilateral upper extremity supported Sitting balance-Leahy Scale: Fair Sitting balance - Comments: requires elevated bed height, 2/2 pain   Standing balance support: Bilateral upper extremity supported, Reliant on assistive device for balance Standing balance-Leahy Scale: Fair Standing balance comment: Fair-poor standing balance, able to maintain for < 1 minute 2/2 b/l LE pain                           ADL either performed or assessed with clinical judgement   ADL Overall ADL's : Needs assistance/impaired                 Upper Body Dressing : Maximal assistance Upper Body Dressing Details (indicate cue type and reason): Max A for donning LSO Lower Body Dressing: Maximal assistance Lower Body Dressing Details (indicate cue type and reason): Max A for donning/doffing socks and shoes (prior to injury, pt completed this INDly)             Functional mobility during ADLs: Moderate assistance;Rolling walker (2 wheels);Maximal assistance General ADL Comments: Mod-Max A for OOB fxl mobility with RW     Vision         Perception     Praxis      Pertinent Vitals/Pain Pain Assessment Pain Score: 10-Worst pain ever Pain Location: b/l LE Pain Descriptors / Indicators: Aching, Radiating     Hand Dominance     Extremity/Trunk Assessment Upper Extremity Assessment Upper Extremity Assessment: Generalized weakness;RUE deficits/detail RUE Deficits / Details: pain and reduced ROM in forward flexion; bruising   Lower Extremity Assessment Lower Extremity Assessment: Generalized weakness       Communication Communication Communication: No difficulties   Cognition Arousal/Alertness: Awake/alert Behavior During Therapy: WFL for tasks assessed/performed Overall  Cognitive Status: Within Functional Limits for tasks assessed                                       General Comments       Exercises Other Exercises Other Exercises: Educ re: precautions, donning/doffing LSO, PoC, DC recs   Shoulder Instructions      Home Living Family/patient expects to be discharged to:: Private residence Living Arrangements: Spouse/significant other Available Help at Discharge: Available 24 hours/day Type of Home: House Home Access: Elevator     Home Layout: Two level;Able to live on main level with bedroom/bathroom     Bathroom Shower/Tub: Occupational psychologist: Standard     Home Equipment: Conservation officer, nature (2 wheels);Cane - single point;Shower seat          Prior Functioning/Environment Prior Level of Function : Independent/Modified Independent             Mobility Comments: Ambulates IND w/ RW or SPC ADLs Comments: Largely IND in ADLs, drives. Receives PRN assist w/ bathing        OT Problem List: Decreased strength;Decreased range of motion;Decreased activity tolerance;Impaired balance (sitting and/or  standing);Pain;Impaired UE functional use      OT Treatment/Interventions: Self-care/ADL training;Therapeutic exercise;Patient/family education;Balance training;Energy conservation;Therapeutic activities;DME and/or AE instruction    OT Goals(Current goals can be found in the care plan section) Acute Rehab OT Goals Patient Stated Goal: to feel better OT Goal Formulation: With patient Time For Goal Achievement: 10/05/22 Potential to Achieve Goals: Good ADL Goals Pt Will Perform Lower Body Dressing: with supervision;sitting/lateral leans;sit to/from stand Pt Will Transfer to Toilet: with supervision;regular height toilet;ambulating (using LRAD) Additional ADL Goal #1: Pt will INDly don/doff LSO in sitting or standing  OT Frequency: Min 2X/week    Co-evaluation              AM-PAC OT "6 Clicks" Daily  Activity     Outcome Measure Help from another person eating meals?: None Help from another person taking care of personal grooming?: A Little Help from another person toileting, which includes using toliet, bedpan, or urinal?: A Lot Help from another person bathing (including washing, rinsing, drying)?: A Lot Help from another person to put on and taking off regular upper body clothing?: A Lot Help from another person to put on and taking off regular lower body clothing?: A Lot 6 Click Score: 15   End of Session Equipment Utilized During Treatment: Rolling walker (2 wheels)  Activity Tolerance: Patient limited by pain Patient left: in bed;with call bell/phone within reach;with bed alarm set;with family/visitor present  OT Visit Diagnosis: Unsteadiness on feet (R26.81);Pain;Muscle weakness (generalized) (M62.81) Pain - part of body: Leg                Time: 7681-1572 OT Time Calculation (min): 35 min Charges:  OT General Charges $OT Visit: 1 Visit OT Evaluation $OT Eval Low Complexity: 1 Low OT Treatments $Self Care/Home Management : 23-37 mins Josiah Lobo, PhD, MS, OTR/L 09/21/22, 2:52 PM

## 2022-09-21 NOTE — TOC Initial Note (Signed)
Transition of Care [facility]) - Initial/Assessment Note    Patient Details  Name: Justin [name] MRN: [id number] Date of Birth: 01/14/1941  Transition of [facility]) CM/SW Contact:    Rebekah Chesterfield, LCSW Phone Number: 09/21/2022, 2:19 PM  Clinical Narrative:                 CSW met with patient, introduced role, and discussed PT evaluation recommendation for [facility] PT. Patient reports hx of [place] services; however, could not recall name of agencies noting he does not have a preference. CSW spoke with Desiree, with [facility] who accepted referral at 2:05 PM for PT   Patient has a 2 wheel walker at home, in addition, to cane and rollator.   Patient resides with spouse and receives strong support from adult daughter. PCP is [name] with [facility]. Pt receives medications through [facility] (via mail) and [facility] [address] Pt reports spouse or daughter will provide transportation at discharge, in addition, to upcoming medical appts.  Expected Discharge Plan: [place] Barriers to Discharge: Continued Medical Work up   Patient Goals and CMS Choice   CMS Medicare.gov Compare Post Acute Care list provided to:: Patient Choice offered to / list presented to : Patient      Expected Discharge Plan and Services       Living arrangements for the past 2 months: [place]: PT [place] Agency: [facility] [place] Date [place]: 09/21/22 Time [place]: 1400 Representative spoke with at [place]: [place] [phone]  Prior Living Arrangements/Services Living arrangements for the past 2 months: [place] with:: Spouse Patient language and need for interpreter reviewed:: Yes Do you feel safe going back to the place where you live?: Yes      Need for Family Participation in Patient Care: Yes (Comment) Care giver support system in place?: Yes (comment) Current home services:  DME (Pt has a walker, rollator, and cane) Criminal Activity/Legal Involvement Pertinent to Current Situation/Hospitalization: No - Comment as needed  Activities of Daily Living Home Assistive Devices/Equipment: CPAP, Dentures (specify type), Hearing aid, Walker (specify type) ADL Screening (condition at time of admission) Patient's cognitive ability adequate to safely complete daily activities?: Yes Is the patient deaf or have difficulty hearing?: No Does the patient have difficulty seeing, even when wearing glasses/contacts?: No Does the patient have difficulty concentrating, remembering, or making decisions?: No Patient able to express need for assistance with ADLs?: Yes Does the patient have difficulty dressing or bathing?: Yes Independently performs ADLs?: No Dressing (OT): Needs assistance Bathing: Needs assistance Toileting: Needs assistance In/Out Bed: Needs assistance Does the patient have difficulty walking or climbing stairs?: Yes Weakness of Legs: Both Weakness of Arms/Hands: Both  Permission Sought/Granted Permission sought to share information with : Customer service manager                Emotional Assessment   Attitude/Demeanor/Rapport: Engaged   Orientation: : Oriented to Self, Oriented to  Time, Oriented to Situation, Oriented to Place Alcohol / Substance Use: Not Applicable Psych Involvement: No (comment)  Admission diagnosis:  Closed compression fracture of L3 lumbar vertebra, initial encounter ([place]) [S32.030A] Acute midline low back pain without sciatica [M54.50] Closed compression fracture of L3 vertebra ([place]) [S32.030A] Patient Active Problem List   Diagnosis  Date Noted   Closed compression fracture of L3 vertebra ([place]) 09/20/2022   Right rib fracture 09/20/2022   Fall at home, initial encounter 09/20/2022   Atrial fibrillation, chronic ([place]) 09/20/2022   HTN (hypertension) 09/20/2022   COPD (chronic obstructive pulmonary disease) ([place])  44/71/5806   Chronic systolic CHF (congestive heart failure) ([place]) 09/20/2022   Gout 09/20/2022   Hypokalemia 09/20/2022   CAD (coronary artery disease) 09/20/2022   Recurrent inguinal hernia 08/14/2021   Facial droop 08/14/2021   S/P inguinal hernia repair 04/29/2021   Acute pain of right shoulder    Septic shock (HCC)    Acute respiratory failure with hypoxia (HCC)    Atrial fibrillation with RVR (HCC)    Thrombocytopenia (HCC)    Left inguinal hernia    Left lower lobe pulmonary infiltrate 03/10/2021   PAD (peripheral artery disease) ([place]) 11/10/2018   Leg pain 11/10/2018   History of compression fracture of spine 07/30/2017   Lobar pneumonia ([place]) 11/03/2015   NSTEMI (non-ST elevated myocardial infarction) ([place]) 11/03/2015   DDD (degenerative disc disease), lumbar 10/12/2014   Incomplete bladder emptying 02/15/2014   Obstruction of urinary tract 02/15/2014   OP (osteoporosis) 01/21/2014   Esophagitis, reflux 01/21/2014   Osteoporosis 01/21/2014   Fatigue 09/30/2013   Malaise and fatigue 09/30/2013   H/O malignant neoplasm of prostate 12/15/2012   Calculus of kidney 12/09/2012   ED (erectile dysfunction) of organic origin 12/09/2012   CA of prostate ([place]) 12/09/2012   Genuine stress incontinence, male 12/09/2012   Urge incontinence 12/09/2012   Arteriosclerosis of coronary artery 06/17/2011   Atherosclerosis of coronary artery 06/17/2011   HLD (hyperlipidemia) 06/17/2011   Obstructive apnea 06/17/2011   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   PCP:  Rusty Aus, MD Pharmacy:   [id number] [address], [place] [place] [address] Phone: [phone] Fax: [phone]     Social Determinants of Health (SDOH) Social History: SDOH Screenings   Food Insecurity: No Food Insecurity (09/21/2022)  Housing: Low Risk  (09/21/2022)  Transportation Needs: No Transportation  Needs (09/21/2022)  Utilities: Not At Risk (09/21/2022)  Tobacco Use: Medium Risk (09/20/2022)   SDOH Interventions:     Readmission Risk Interventions     No data to display

## 2022-09-21 NOTE — Evaluation (Addendum)
Physical Therapy Evaluation Patient Details Name: Justin [name] MRN: [id number] DOB: September 20, 1941 Today's Date: 09/21/2022  History of Present Illness  Justin [name] is a 81 y.o. male with medical history significant of hypertension, hyperlipidemia, COPD on 2 L oxygen, CAD, CABG, systolic CHF with EF of 81-27%, gout, GERD, prostate cancer, BPH, PAD, anemia, L2 compression fracture (S/p of kyphoplasty of L2), s/p of augmented compression fractures T7 through T12, who presents with fall and back pain.     The patient states that he lost his balance and fell into the corner between the wall and floor on 3 days ago. No LOC. He states he also hit his head but does not have headache or neck pain at this time. He developed pain in lower back, right rib cage,right shoulder. The pain he lower back is the worst, which is constant, severe, radiating to both legs bilaterally. Patient states that initially he was able to get up and walk around, but in the past 2 days, he has worsening pain, now is unable to get out of bed this morning due to severe pain. X-Ray revealed closed compression Fx L3.  Clinical Impression  Pt received in bed with O2.. Pt agreeable to PT evaluation. Neuro surgeon consult pending. Pt reported that he has a history of Vertigo in R ear. Pt reported of falling this time due to symptoms of vertigo. Pt reported of pain with palpation in L>R lower 2/3 femoral area with palpation and requested X-Ray of LLE and R knee. PT performed modified rolling test ( secondary to severe pain with movement)  for horizontal canal vertigo but pt without symptoms at this time. Pt has been walking at home with cane, walker or furniture walking. Pt was home alone when he feel but his wife is home now 24/7. Pt education regarding back brace and back precaution to prevent further injuries. Pt has 7 to 10/10 pain with movement.  Light touch Sensation intact. Pt performed log rolling bed mobility with Sup and donned back brace  in sitting. Pt transferred to chair with CGA of 1 using FWW. Pt ambulated in room with CGA of 1 with Brace donned through out the session. Pt remained in constant but fluctuating pain form 5 to 10/10 through out the session. Pt in chair comfortable with all needs within reach. Pt nurse notified about chair alarm pad in place but pt did not have the green monitor to activate the alarm. Pt will benefit from HHPT and BSC3-1which may change based on Neuro consult. .       Recommendations for follow up therapy are one component of a multi-disciplinary discharge planning process, led by the attending physician.  Recommendations may be updated based on patient status, additional functional criteria and insurance authorization.  Follow Up Recommendations Home health PT      Assistance Recommended at Discharge Intermittent Supervision/Assistance  Patient can return home with the following  A little help with walking and/or transfers;A little help with bathing/dressing/bathroom;Assistance with cooking/housework;Assist for transportation    Equipment Recommendations Rolling walker (2 wheels)  Recommendations for Other Services       Functional Status Assessment Patient has had a recent decline in their functional status and demonstrates the ability to make significant improvements in function in a reasonable and predictable amount of time.     Precautions / Restrictions Precautions Precautions: Fall Precaution Comments: HX OF VERTIGO. Required Braces or Orthoses: Spinal Brace Restrictions Weight Bearing Restrictions: No Other Position/Activity Restrictions: Brace when OOB.  Mobility  Bed Mobility Overal bed mobility: Needs Assistance Bed Mobility: Rolling, Sidelying to Sit Rolling: Supervision Sidelying to sit: Supervision       General bed mobility comments: slow and in severe pain.    Transfers Overall transfer level: Needs assistance Equipment used: Rolling walker (2  wheels) Transfers: Sit to/from Stand, Bed to chair/wheelchair/BSC Sit to Stand: Min guard   Step pivot transfers: Min guard       General transfer comment: Needs assitance 2/2 severe pain.    Ambulation/Gait Ambulation/Gait assistance: Min guard Gait Distance (Feet): 7 Feet Assistive device: Rolling walker (2 wheels) Gait Pattern/deviations: Step-through pattern, Decreased stride length, Antalgic Gait velocity: dec     General Gait Details: slow and guarded  2/2 to pain  Stairs            Wheelchair Mobility    Modified Rankin (Stroke Patients Only)       Balance Overall balance assessment: Needs assistance Sitting-balance support: Feet supported, Bilateral upper extremity supported (2/2 to pain) Sitting balance-Leahy Scale: Good     Standing balance support: Bilateral upper extremity supported, Reliant on assistive device for balance Standing balance-Leahy Scale: Good Standing balance comment: steady on BLE but in severe pain in BLE                             Pertinent Vitals/Pain Pain Assessment Pain Assessment: 0-10 Pain Score: 8  Pain Location: Low back and L>R LE Pain Descriptors / Indicators: Aching, Constant Pain Intervention(s): Premedicated before session    Home Living Family/patient expects to be discharged to:: Private residence Living Arrangements: Spouse/significant other Available Help at Discharge: Available 24 hours/day Type of Home: House Home Access: Elevator       Home Layout: One level Home Equipment: Conservation officer, nature (2 wheels);Cane - single point      Prior Function Prior Level of Function : Independent/Modified Independent             Mobility Comments: independent ambualtor at household level. However, since the thoracic fracture, pt has been suing FWW and SPC at home. ADLs Comments: Needs assistance with bathing, meals since the prefious thoracic  fracture     Hand Dominance        Extremity/Trunk  Assessment   Upper Extremity Assessment Upper Extremity Assessment: Defer to OT evaluation    Lower Extremity Assessment Lower Extremity Assessment: Generalized weakness (unable to asses fully due to pain.)       Communication   Communication: No difficulties  Cognition Arousal/Alertness: Awake/alert Behavior During Therapy: WFL for tasks assessed/performed Overall Cognitive Status: Within Functional Limits for tasks assessed                                          General Comments      Exercises     Assessment/Plan    PT Assessment Patient needs continued PT services  PT Problem List Decreased strength;Pain;Decreased activity tolerance;Decreased mobility       PT Treatment Interventions Gait training;Functional mobility training;Therapeutic activities;Therapeutic exercise;Patient/family education    PT Goals (Current goals can be found in the Care Plan section)  Acute Rehab PT Goals Patient Stated Goal: " I want to not hurt." PT Goal Formulation: With patient Time For Goal Achievement: 10/05/22 Potential to Achieve Goals: Good Additional Goals Additional Goal #1: pt will verbalize and dmeonstrate  back precautions and wear brace 100% of th etime correctly    Frequency 7X/week     Co-evaluation               AM-PAC PT "6 Clicks" Mobility  Outcome Measure Help needed turning from your back to your side while in a flat bed without using bedrails?: None Help needed moving from lying on your back to sitting on the side of a flat bed without using bedrails?: A Little Help needed moving to and from a bed to a chair (including a wheelchair)?: A Little Help needed standing up from a chair using your arms (e.g., wheelchair or bedside chair)?: A Little Help needed to walk in hospital room?: A Little Help needed climbing 3-5 steps with a railing? : A Little 6 Click Score: 19    End of Session Equipment Utilized During Treatment: Gait  belt Activity Tolerance: Patient limited by pain;Patient tolerated treatment well Patient left: in chair;with call bell/phone within reach;with chair alarm set Nurse Communication: Mobility status;Precautions;Patient requests pain meds PT Visit Diagnosis: Other abnormalities of gait and mobility (R26.89);History of falling (Z91.81);Muscle weakness (generalized) (M62.81);Difficulty in walking, not elsewhere classified (R26.2) (Hx of vertigo.)    Time: 0920-0955 PT Time Calculation (min) (ACUTE ONLY): 35 min   Charges:   PT Evaluation $PT Eval Moderate Complexity: 1 Mod PT Treatments $Gait Training: 8-22 mins       Joaquin Music PT DPT 11:33 AM,09/21/22  Joaquin Music PT DPT 5:10 PM,09/21/22

## 2022-09-21 NOTE — Hospital Course (Signed)
Justin [name] is a 81 y.o. male with medical history significant of hypertension, hyperlipidemia, COPD on 2 L oxygen, CAD, CABG, systolic CHF with EF of 09-81%, gout, GERD, prostate cancer, BPH, PAD, anemia, L2 compression fracture (S/p of kyphoplasty of L2), s/p of augmented compression fractures T7 through T12, who presents with fall and back pain.  Patient has a chronic vertigo, he became very dizzy prior to the fall.  He sustained a L3 compression fracture.  He is admitted to hospital for further monitoring. Patient is seen by PT/OT, need nursing home placement.  However, patient could not afford self-pay.

## 2022-09-22 DIAGNOSIS — W19XXXA Unspecified fall, initial encounter: Secondary | ICD-10-CM | POA: Diagnosis not present

## 2022-09-22 DIAGNOSIS — S32030A Wedge compression fracture of third lumbar vertebra, initial encounter for closed fracture: Secondary | ICD-10-CM | POA: Diagnosis not present

## 2022-09-22 DIAGNOSIS — S2231XA Fracture of one rib, right side, initial encounter for closed fracture: Secondary | ICD-10-CM | POA: Diagnosis not present

## 2022-09-22 DIAGNOSIS — S32030G Wedge compression fracture of third lumbar vertebra, subsequent encounter for fracture with delayed healing: Secondary | ICD-10-CM

## 2022-09-22 LAB — BASIC METABOLIC PANEL
Anion gap: 10 (ref 5–15)
BUN: 13 mg/dL (ref 8–23)
CO2: 32 mmol/L (ref 22–32)
Calcium: 9.2 mg/dL (ref 8.9–10.3)
Chloride: 95 mmol/L — ABNORMAL LOW (ref 98–111)
Creatinine, Ser: 1.11 mg/dL (ref 0.61–1.24)
GFR, Estimated: >60 mL/min (ref >60)
Glucose, Bld: 102 mg/dL — ABNORMAL HIGH (ref 70–99)
Potassium: 3.5 mmol/L (ref 3.5–5.1)
Sodium: 137 mmol/L (ref 135–145)

## 2022-09-22 LAB — MAGNESIUM: Magnesium: 2.3 mg/dL (ref 1.7–2.4)

## 2022-09-22 MED ORDER — POTASSIUM CHLORIDE CRYS ER 20 MEQ PO TBCR
40.0000 meq | EXTENDED_RELEASE_TABLET | Freq: Once | ORAL | Status: AC
  Administered 2022-09-22: 40 meq via ORAL
  Filled 2022-09-22: qty 2

## 2022-09-22 MED ORDER — SENNOSIDES-DOCUSATE SODIUM 8.6-50 MG PO TABS
2.0000 | ORAL_TABLET | Freq: Two times a day (BID) | ORAL | Status: DC
  Administered 2022-09-22 – 2022-09-29 (×15): 2 via ORAL
  Filled 2022-09-22 (×15): qty 2

## 2022-09-22 MED ORDER — OXYCODONE-ACETAMINOPHEN 5-325 MG PO TABS
1.0000 | ORAL_TABLET | ORAL | Status: DC | PRN
  Administered 2022-09-22 – 2022-09-25 (×13): 2 via ORAL
  Administered 2022-09-25 – 2022-09-29 (×2): 1 via ORAL
  Filled 2022-09-22 (×3): qty 2
  Filled 2022-09-22: qty 1
  Filled 2022-09-22 (×4): qty 2
  Filled 2022-09-22: qty 1
  Filled 2022-09-22 (×7): qty 2

## 2022-09-22 NOTE — Care Management Obs Status (Signed)
[place]   Patient Details  Name: Justin [name] MRN: [id number] Date of Birth: 22-Apr-1941   Medicare Observation Status Notification Given:  Yes    Candie Chroman, LCSW 09/22/2022, 2:25 PM
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
## 2022-09-22 NOTE — TOC Progression Note (Signed)
Transition of Care [facility]) - Progression Note    Patient Details  Name: Justin [name] MRN: [id number] Date of Birth: [birth year]  Transition of Care [facility]) CM/SW [place], LCSW Phone Number: 09/22/2022, 2:57 PM  Clinical Narrative:  PT changed recommendation to SNF. Patient has traditional Medicare and is under observation status. He is willing to consider paying privately for placement. Patient has home NIV so will need to order bipap for SNF. Asked MD to order for tonight.   Expected Discharge Plan: [place] Barriers to Discharge: Continued Medical Work up  Expected Discharge Plan and [place] arrangements for the past 2 months: [place]: PT [place]: [facility] Date [place]: 09/21/22 Time [place]: 1400 Representative spoke with at [place]: [place] [phone]   Social Determinants of Health ([place]) Interventions [place]: No Food Insecurity (09/21/2022)  Housing: Low Risk  (09/21/2022)  Transportation Needs: No Transportation Needs (09/21/2022)  Utilities: Not At Risk (09/21/2022)  Tobacco Use: Medium Risk (09/20/2022)    Readmission Risk Interventions     No data to display

## 2022-09-22 NOTE — NC FL2 (Signed)
[place] LEVEL OF CARE FORM     IDENTIFICATION  Patient Name: Justin [name] Birthdate: [birth year] Sex: male Admission Date (Current Location): 09/20/2022  [facility] and [place] Number:  Engineering geologist and Address:  [facility], [address] [place], [place] [address]      Provider Number: [id number]  Attending Physician Name and Address:  Sharen Hones, MD  Relative Name and Phone Number:       Current Level of Care: Hospital Recommended Level of Care: [place] Prior Approval Number:    Date Approved/Denied:   PASRR Number: Pending  Discharge Plan: SNF    Current Diagnoses: Patient Active Problem List   Diagnosis Date Noted   Closed compression fracture of L3 vertebra ([place]) 09/20/2022   Right rib fracture 09/20/2022   Fall at home, initial encounter 09/20/2022   Atrial fibrillation, chronic ([place]) 09/20/2022   HTN (hypertension) 09/20/2022   COPD (chronic obstructive pulmonary disease) ([place]) 24/23/5361   Chronic systolic CHF (congestive heart failure) ([place]) 09/20/2022   Gout 09/20/2022   Hypokalemia 09/20/2022   CAD (coronary artery disease) 09/20/2022   Recurrent inguinal hernia 08/14/2021   Facial droop 08/14/2021   S/P inguinal hernia repair 04/29/2021   Acute pain of right shoulder    Septic shock ([place])    Acute respiratory failure with hypoxia ([place])    Atrial fibrillation with RVR ([place])    Thrombocytopenia ([place])    Left inguinal hernia    Left lower lobe pulmonary infiltrate 03/10/2021   PAD (peripheral artery disease) ([place]) 11/10/2018   Leg pain 11/10/2018   History of compression fracture of spine 07/30/2017   Lobar pneumonia ([place]) 11/03/2015   NSTEMI (non-ST elevated myocardial infarction) ([place]) 11/03/2015   DDD (degenerative disc disease), lumbar 10/12/2014   Incomplete bladder emptying 02/15/2014   Obstruction of urinary tract 02/15/2014   OP (osteoporosis) 01/21/2014   Esophagitis,  reflux 01/21/2014   Osteoporosis 01/21/2014   Fatigue 09/30/2013   Malaise and fatigue 09/30/2013   H/O malignant neoplasm of prostate 12/15/2012   Calculus of kidney 12/09/2012   ED (erectile dysfunction) of organic origin 12/09/2012   CA of prostate ([place]) 12/09/2012   Genuine stress incontinence, male 12/09/2012   Urge incontinence 12/09/2012   Arteriosclerosis of coronary artery 06/17/2011   Atherosclerosis of coronary artery 06/17/2011   HLD (hyperlipidemia) 06/17/2011   Obstructive apnea 06/17/2011   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 Uses trilogy (NIV) at home. Will need to order bipap. Will ask respiratory to place him on bipap to obtain settings.) Continent Weight: 147 lb 0.8 oz (66.7 kg) Height:  '5\' 7"'$  (170.2 cm)  BEHAVIORAL SYMPTOMS/MOOD NEUROLOGICAL BOWEL NUTRITION STATUS   (None)  (None) Continent Diet (Heart healthy)  AMBULATORY STATUS COMMUNICATION OF NEEDS Skin   Extensive Assist Verbally Bruising                       Personal Care Assistance Level of Assistance  Bathing, Feeding, Dressing Bathing Assistance: Maximum assistance Feeding assistance: Limited assistance Dressing Assistance: Maximum assistance     Functional Limitations Info  Sight, Hearing, Speech Sight Info: Adequate Hearing Info: Adequate Speech Info: Adequate    SPECIAL CARE FACTORS FREQUENCY  PT (By licensed PT), OT (By licensed OT)     PT Frequency: 5 x week OT Frequency: 5 x week  Contractures Contractures Info: Not present    Additional Factors Info  Code Status, Allergies Code Status Info: Full code Allergies Info: Hydrochlorothiazide W-triamterene           Current Medications (09/22/2022):  This is the current hospital active medication list Current Facility-Administered Medications  Medication Dose Route Frequency  Provider Last Rate Last Admin   acetaminophen (TYLENOL) tablet 650 mg  650 mg Oral Q6H PRN Ivor Costa, MD   650 mg at 09/20/22 2008   acidophilus (RISAQUAD) capsule 1 capsule  1 capsule Oral Q1400 Ivor Costa, MD   1 capsule at 09/22/22 1200   albuterol (PROVENTIL) (2.5 MG/3ML) 0.083% nebulizer solution 3 mL  3 mL Inhalation Q4H PRN Ivor Costa, MD       allopurinol (ZYLOPRIM) tablet 300 mg  300 mg Oral Daily Ivor Costa, MD   300 mg at 09/22/22 0815   apixaban (ELIQUIS) tablet 5 mg  5 mg Oral BID Ivor Costa, MD   5 mg at 09/22/22 0815   arformoterol (BROVANA) nebulizer solution 15 mcg  15 mcg Nebulization BID Ivor Costa, MD   15 mcg at 09/22/22 0747   And   umeclidinium bromide (INCRUSE ELLIPTA) 62.5 MCG/ACT 1 puff  1 puff Inhalation Daily Ivor Costa, MD   1 puff at 09/22/22 0816   atorvastatin (LIPITOR) tablet 40 mg  40 mg Oral Daily Ivor Costa, MD   40 mg at 09/22/22 0815   calcium-vitamin D (OSCAL WITH D) 500-5 MG-MCG per tablet 1 tablet  1 tablet Oral Q1400 Ivor Costa, MD   1 tablet at 09/22/22 1200   cholecalciferol (VITAMIN D3) 25 MCG (1000 UNIT) tablet 3,000 Units  3,000 Units Oral Q1200 Ivor Costa, MD   3,000 Units at 09/22/22 1159   cyanocobalamin (VITAMIN B12) tablet 1,000 mcg  1,000 mcg Oral Daily Ivor Costa, MD   1,000 mcg at 09/22/22 0815   dextromethorphan-guaiFENesin ([place] DM) 30-600 MG per 12 hr tablet 1 tablet  1 tablet Oral BID PRN Ivor Costa, MD       ferrous gluconate (FERGON) tablet 324 mg  324 mg Oral Q breakfast Ivor Costa, MD   324 mg at 09/22/22 0816   fluticasone (FLONASE) 50 MCG/ACT nasal spray 2 spray  2 spray Each Nare QHS Ivor Costa, MD       gabapentin (NEURONTIN) capsule 300 mg  300 mg Oral QHS Ivor Costa, MD   300 mg at 09/21/22 2103   hydrALAZINE (APRESOLINE) injection 5 mg  5 mg Intravenous Q2H PRN Ivor Costa, MD       hydrocortisone (ANUSOL-HC) suppository 25 mg  25 mg Rectal Daily PRN Ivor Costa, MD       isosorbide mononitrate (IMDUR) 24 hr tablet 60 mg  60  mg Oral Daily Ivor Costa, MD   60 mg at 09/22/22 0815   lidocaine (LIDODERM) 5 % 1 patch  1 patch Transdermal Q24H Ivor Costa, MD   1 patch at 09/22/22 1200   loratadine (CLARITIN) tablet 10 mg  10 mg Oral Daily Ivor Costa, MD   10 mg at 09/22/22 0815   methocarbamol (ROBAXIN) tablet 500 mg  500 mg Oral Q8H PRN Ivor Costa, MD   500 mg at 09/22/22 0815   nitroGLYCERIN (NITROSTAT) SL tablet 0.4 mg  0.4 mg Sublingual Q5 Min x 3 PRN Ivor Costa, MD       ondansetron [facility]) injection 4 mg  4 mg Intravenous Q8H PRN Ivor Costa, MD       oxyCODONE-acetaminophen (  PERCOCET/ROXICET) 5-325 MG per tablet 1-2 tablet  1-2 tablet Oral Q4H PRN Sharen Hones, MD   2 tablet at 09/22/22 1032   pantoprazole (PROTONIX) EC tablet 40 mg  40 mg Oral Daily Ivor Costa, MD   40 mg at 09/22/22 0815   polyethylene glycol (MIRALAX / GLYCOLAX) packet 17 g  17 g Oral Daily PRN Ivor Costa, MD   17 g at 09/22/22 1032   propranolol (INDERAL) tablet 80 mg  80 mg Oral Daily Ivor Costa, MD   80 mg at 09/22/22 1517   senna-docusate (Senokot-S) tablet 2 tablet  2 tablet Oral BID Sharen Hones, MD   2 tablet at 09/22/22 1200   torsemide (DEMADEX) tablet 40 mg  40 mg Oral QAC breakfast Ivor Costa, MD   40 mg at 09/22/22 0815   Facility-Administered Medications Ordered in Other Encounters  Medication Dose Route Frequency Provider Last Rate Last Admin   furosemide (LASIX) injection 80 mg  80 mg Intravenous Once Teodoro Spray, MD       potassium chloride (KLOR-CON) CR tablet 40 mEq  40 mEq Oral Daily Teodoro Spray, MD         Discharge Medications: Please see discharge summary for a list of discharge medications.  Relevant Imaging Results:  Relevant Lab Results:   Additional Information SS#: [id number]  Candie Chroman, LCSW

## 2022-09-22 NOTE — Progress Notes (Signed)
Physical Therapy Treatment Patient Details Name: Justin [name] MRN: [id number] DOB: 1941-06-13 Today's Date: 09/22/2022   History of Present Illness Justin [name] is a 82 y.o. male with medical history significant of hypertension, hyperlipidemia, COPD on 2 L oxygen, CAD, CABG, systolic CHF with EF of 48-25%, gout, GERD, prostate cancer, BPH, PAD, anemia, L2 compression fracture (S/p of kyphoplasty of L2), s/p of augmented compression fractures T7 through T12, who presents with fall and back pain.     The patient states that he lost his balance and fell into the corner between the wall and floor on 3 days ago. No LOC. He states he also hit his head but does not have headache or neck pain at this time. He developed pain in lower back, right rib cage,right shoulder. The pain he lower back is the worst, which is constant, severe, radiating to both legs bilaterally. Patient states that initially he was able to get up and walk around, but in the past 2 days, he has worsening pain, now is unable to get out of bed this morning due to severe pain. X-Ray revealed closed compression Fx L3.    PT Comments    Pt was supine in bed with HOB only elevated ~ 15 degrees. Unable to tolerate sitting up more than that without having severe discomfort and spasms. Pt is A and O x 4 and a good historian of past medical history. Discussed home situation and safety concerns. Pt lives with elderly spouse who is unable to provide any physical lifting assistance. Pt puts forth great effort during session but is severely limited by pain however was pre-medicated 30 minutes prior to session. Was able to exit bed and stand EOB but required more assistance + a lot of time to safely perform desired task. Max assist to properly apply LSO brace at EOB. He stood 2 x EOB but due to pain was unable to advance away from EOB. Pt wants to DC home however Justin [name] voiced concerns with this in current state. He would benefit form SNF at DC to maximize  his safety with ADLs while assisting pt to PLOF.    Recommendations for follow up therapy are one component of a multi-disciplinary discharge planning process, led by the attending physician.  Recommendations may be updated based on patient status, additional functional criteria and insurance authorization.  Follow Up Recommendations  Skilled nursing-short term rehab (<3 hours/day)     Assistance Recommended at Discharge Frequent or constant Supervision/Assistance  Patient can return home with the following A lot of help with walking and/or transfers;A lot of help with bathing/dressing/bathroom;Assistance with cooking/housework;Assist for transportation;Help with stairs or ramp for entrance   Equipment Recommendations  Rolling walker (2 wheels)       Precautions / Restrictions Precautions Precautions: Fall Precaution Comments: HX OF VERTIGO. (no symptoms this session) Required Braces or Orthoses: Spinal Brace Spinal Brace: Other (comment) (LSO) Restrictions Weight Bearing Restrictions: No     Mobility  Bed Mobility Overal bed mobility: Needs Assistance Bed Mobility: Rolling, Sidelying to Sit, Sit to Sidelying Rolling: Min assist Sidelying to sit: Mod assist     Sit to sidelying: Mod assist General bed mobility comments: pt required alot more assistance to exit bed today versus yesterday morning with PT. pain greatly limited session progression but pt puts forth great effort throughout. Needed max assist to properly apply LSO    Transfers Overall transfer level: Needs assistance Equipment used: Rolling walker (2 wheels) Transfers: Sit to/from Stand Sit to  Stand: Min assist, From elevated surface    General transfer comment: pt was able to tolerate standing EOB 2 x but unable to stand long enough to progress to ambulation. pt does demonstrate signs of severe pain. Again pt is cooperative and willing to try but severely limited by pain    Ambulation/Gait    General Gait  Details: unable to tolerate standing more than 30 sec. Did clear R/L LE at EOB but has slight L knee buckling with lifting RLE. had to quickly return to supine form EOB due to severe increase in pain + c/o slight dizziness. Once repositioned in supine, symptoms resolved.    Balance Overall balance assessment: Needs assistance Sitting-balance support: Feet supported, Bilateral upper extremity supported Sitting balance-Leahy Scale: Fair Sitting balance - Comments: due to pain pt has to reposition constantly at EOB which makes him at risk for falling even in sitting   Standing balance support: Bilateral upper extremity supported, Reliant on assistive device for balance Standing balance-Leahy Scale: Fair Standing balance comment: Only able to tolerate static standing ~ 30 sec before needing seated rest.         Cognition Arousal/Alertness: Awake/alert Behavior During Therapy: WFL for tasks assessed/performed Overall Cognitive Status: Within Functional Limits for tasks assessed        General Comments: Pt is A and O x 4               Pertinent Vitals/Pain Pain Assessment Pain Assessment: 0-10 Pain Score: 10-Worst pain ever (when standing) Pain Location: b/l LE Pain Descriptors / Indicators: Aching, Radiating Pain Intervention(s): Limited activity within patient's tolerance, Monitored during session, Premedicated before session, Repositioned     PT Goals (current goals can now be found in the care plan section) Acute Rehab PT Goals Patient Stated Goal: less pain so I can go home. Progress towards PT goals: Not progressing toward goals - comment (pain severely limiting pt abilities. Pt puts forth great effort throughout session)    Frequency    7X/week      PT Plan Discharge plan needs to be updated       AM-PAC PT "6 Clicks" Mobility   Outcome Measure  Help needed turning from your back to your side while in a flat bed without using bedrails?: A Little Help needed  moving from lying on your back to sitting on the side of a flat bed without using bedrails?: A Lot Help needed moving to and from a bed to a chair (including a wheelchair)?: A Lot Help needed standing up from a chair using your arms (e.g., wheelchair or bedside chair)?: A Lot Help needed to walk in hospital room?: A Lot Help needed climbing 3-5 steps with a railing? : A Lot 6 Click Score: 13    End of Session   Activity Tolerance: Patient limited by pain Patient left: in bed;with call bell/phone within reach;with bed alarm set Nurse Communication: Mobility status;Precautions PT Visit Diagnosis: Other abnormalities of gait and mobility (R26.89);History of falling (Z91.81);Muscle weakness (generalized) (M62.81);Difficulty in walking, not elsewhere classified (R26.2)     Time: 2706-2376 PT Time Calculation (min) (ACUTE ONLY): 27 min  Charges:  $Therapeutic Activity: 23-37 mins                     Julaine Fusi PTA 09/22/22, 12:01 PM

## 2022-09-22 NOTE — Plan of Care (Signed)
  Problem: Health Behavior/Discharge Planning: Goal: Ability to manage health-related needs will improve Outcome: Progressing   

## 2022-09-22 NOTE — Progress Notes (Signed)
  Progress Note   Patient: Justin [name] [id number]:[id number] DOB: [birth year] DOA: 09/20/2022     0 DOS: the patient was seen and examined on 09/22/2022   Brief hospital course: Justin [name] is a 82 y.o. male with medical history significant of hypertension, hyperlipidemia, COPD on 2 L oxygen, CAD, CABG, systolic CHF with EF of 09-32%, gout, GERD, prostate cancer, BPH, PAD, anemia, L2 compression fracture (S/p of kyphoplasty of L2), s/p of augmented compression fractures T7 through T12, who presents with fall and back pain.  Patient has a chronic vertigo, he became very dizzy prior to the fall.  He sustained a L3 compression fracture.  He is admitted to hospital for further monitoring. Patient is seen by PT/OT, need nursing home placement.  However, patient could not afford self-pay.  Assessment and Plan:  Closed compression fracture of L3 vertebrae. Rib fracture. Fall due to vertigo Chronic vertigo. Patient still have significant pain in the back as well as bilateral legs.  X-ray of the legs did not show any acute changes.  Patient still require pain medicine.  LSO placed. Patient has been seen by neurosurgery, no need for surgery at this time.  The patient still have significant pain, was seen by PT/OT again, deemed necessary for nursing home placement.  However, patient could not afford self-pay.  We probably have to treat him in the hospital until he is strong enough to go home.   Chronic atrial fibrillation. Continue beta-blocker and Eliquis.   Essential hypertension. Continue beta-blocker.   Chronic systolic congestive heart failure. BNP minimally elevated, no evidence of exacerbation.   COPD. Continue home treatment, no bronchospasm.   Hypokalemia. Received potassium, potassium today 3.5, K+ patient kcl 40 mEq orally.      Subjective:  Patient still have significant pain in the back.  Not able to ambulate well.  Physical Exam: Vitals:   09/21/22 1740 09/21/22 1918 09/22/22  0500 09/22/22 0748  BP:  128/76 120/80 117/79  Pulse:  83 72 81  Resp:  '17 18 20  '$ Temp:  97.8 F (36.6 C) 98.6 F (37 C) 98 F (36.7 C)  TempSrc:  Oral  Oral  SpO2: 97% 96% 94% 96%  Weight:   66.7 kg   Height:       General exam: Appears calm and comfortable  Respiratory system: Clear to auscultation. Respiratory effort normal. Cardiovascular system: S1 & S2 heard, RRR. No JVD, murmurs, rubs, gallops or clicks. No pedal edema. Gastrointestinal system: Abdomen is nondistended, soft and nontender. No organomegaly or masses felt. Normal bowel sounds heard. Central nervous system: Alert and oriented. No focal neurological deficits. Extremities: Symmetric 5 x 5 power. Skin: No rashes, lesions or ulcers Psychiatry: Judgement and insight appear normal. Mood & affect appropriate.   Data Reviewed:  Lab results reviewed.  Family Communication: wife updated over the phone.   Disposition: Status is: Observation   Planned Discharge Destination: Home with Home Health    Time spent: 35 minutes  Author: Sharen Hones, MD 09/22/2022 11:53 AM  For on call review [email or web address].

## 2022-09-23 DIAGNOSIS — I5022 Chronic systolic (congestive) heart failure: Secondary | ICD-10-CM | POA: Diagnosis not present

## 2022-09-23 DIAGNOSIS — I482 Chronic atrial fibrillation, unspecified: Secondary | ICD-10-CM | POA: Diagnosis not present

## 2022-09-23 DIAGNOSIS — S32030G Wedge compression fracture of third lumbar vertebra, subsequent encounter for fracture with delayed healing: Secondary | ICD-10-CM | POA: Diagnosis not present

## 2022-09-23 MED ORDER — MAGNESIUM HYDROXIDE 400 MG/5ML PO SUSP
15.0000 mL | Freq: Every day | ORAL | Status: DC | PRN
  Administered 2022-09-23: 15 mL via ORAL
  Filled 2022-09-23: qty 30

## 2022-09-23 MED ORDER — POTASSIUM CHLORIDE CRYS ER 20 MEQ PO TBCR
40.0000 meq | EXTENDED_RELEASE_TABLET | Freq: Once | ORAL | Status: AC
  Administered 2022-09-23: 40 meq via ORAL
  Filled 2022-09-23: qty 2

## 2022-09-23 NOTE — TOC Progression Note (Addendum)
Transition of Care [facility]) - Progression Note    Patient Details  Name: Justin [name] MRN: [id number] Date of Birth: Feb 10, 1941  Transition of Care [facility]) CM/SW [place], LCSW Phone Number: 09/23/2022, 8:48 AM  Clinical Narrative:   PASARR still pending.  9:29 am: [facility] would require that patient pay two months up front before they can admit ($24,540). [place] does not have any beds at this time. [facility] is unable to accept private pay right now. Left messages for [facility] and [facility] admissions coordinators. [facility] declined due to bipap.  11:47 am: [facility] is unable to offer a bed. [facility] would require 30 days up front ($310 per day = $9300). Therapy would cost $160 per hour. [facility] does not accept bipaps. They will see if they could order a CPAP for him since he has been using one here.  12:26 pm: Expanded search to [place].  2:31 pm: PASARR obtained: [id number]. Patient will review bed offers and discuss cost with his wife. CSW will follow up tomorrow. Will wear bipap tonight.  3:35 pm: Patient is considering returning home at discharge and paying privately for caregivers. With his permission, made [facility] referral.  Expected Discharge Plan: [place] Barriers to Discharge: Continued Medical Work up  Expected Discharge Plan and [place] arrangements for the past 2 months: [place]: PT [place]: [facility] Date [place]: 09/21/22 Time [place]: 1400 Representative spoke with at [place]: [place] [phone]   Social Determinants of Health ([place]) Interventions [place]: No Food Insecurity (09/21/2022)  Housing: Low Risk  (09/21/2022)  Transportation Needs: No Transportation Needs (09/21/2022)  Utilities: Not At Risk (09/21/2022)  Tobacco Use:  Medium Risk (09/20/2022)    Readmission Risk Interventions     No data to display

## 2022-09-23 NOTE — Progress Notes (Signed)
Physical Therapy Treatment Patient Details Name: Justin [name] MRN: [id number] DOB: [birth year] Today's Date: 09/23/2022   History of Present Illness Justin [name] is a 82 y.o. male with medical history significant of hypertension, hyperlipidemia, COPD on 2 L oxygen, CAD, CABG, systolic CHF with EF of 94-49%, gout, GERD, prostate cancer, BPH, PAD, anemia, L2 compression fracture (S/p of kyphoplasty of L2), s/p of augmented compression fractures T7 through T12, who presents with fall and back pain.     The patient states that he lost his balance and fell into the corner between the wall and floor on 3 days ago. No LOC. He states he also hit his head but does not have headache or neck pain at this time. He developed pain in lower back, right rib cage,right shoulder. The pain he lower back is the worst, which is constant, severe, radiating to both legs bilaterally. Patient states that initially he was able to get up and walk around, but in the past 2 days, he has worsening pain, now is unable to get out of bed this morning due to severe pain. X-Justin [name] revealed closed compression Fx L3.    PT Comments    Pt is making gradual progress towards goals with ability to stand at  bedside for <3 minutes secondary to severe pain. Reports at 9/10 despite being premedicated. Pain worsens with Wbing through R foot. Needs significant assist for all mobility. Pt reports hopeful to dc to SNF this date. Will continue to progress as able.   Recommendations for follow up therapy are one component of a multi-disciplinary discharge planning process, led by the attending physician.  Recommendations may be updated based on patient status, additional functional criteria and insurance authorization.  Follow Up Recommendations  Skilled nursing-short term rehab (<3 hours/day) Can patient physically be transported by private vehicle: No   Assistance Recommended at Discharge Frequent or constant Supervision/Assistance  Patient can  return home with the following A lot of help with walking and/or transfers;A lot of help with bathing/dressing/bathroom;Assistance with cooking/housework;Assist for transportation;Help with stairs or ramp for entrance   Equipment Recommendations  Rolling walker (2 wheels)    Recommendations for Other Services       Precautions / Restrictions Precautions Precautions: Fall Precaution Comments: HX OF VERTIGO. Required Braces or Orthoses: Spinal Brace Spinal Brace:  (LSO) Restrictions Weight Bearing Restrictions: No     Mobility  Bed Mobility Overal bed mobility: Needs Assistance Bed Mobility: Rolling, Sidelying to Sit, Sit to Sidelying   Sidelying to sit: Min assist     Sit to sidelying: Min assist General bed mobility comments: moves very slowly and able to utilize bed functions for increased independence. Does require assist with B LEs. Once seated, LSO donned in addition to shoes    Transfers Overall transfer level: Needs assistance Equipment used: Rolling walker (2 wheels) Transfers: Sit to/from Stand Sit to Stand: Min assist           General transfer comment: needs elevated bed and assistance. Once standing, no WBing noted through R LE secondary to pain. Was able to side step up towards [facility] with increased pain with R LE WBing attempts. Not safe/unable to ambulate away from bed.    Ambulation/Gait               General Gait Details: unable   Stairs             Wheelchair Mobility    Modified Rankin (Stroke Patients Only)  Balance Overall balance assessment: Needs assistance Sitting-balance support: Feet supported, Bilateral upper extremity supported Sitting balance-Leahy Scale: Fair     Standing balance support: Bilateral upper extremity supported, Reliant on assistive device for balance Standing balance-Leahy Scale: Fair                              Cognition Arousal/Alertness: Awake/alert Behavior During Therapy:  WFL for tasks assessed/performed Overall Cognitive Status: Within Functional Limits for tasks assessed                                 General Comments: Pt is A and O x 4        Exercises      General Comments        Pertinent Vitals/Pain Pain Assessment Pain Assessment: 0-10 Pain Score: 9  Pain Location: R LE > L LE Pain Descriptors / Indicators: Aching, Radiating Pain Intervention(s): Limited activity within patient's tolerance, Premedicated before session, Repositioned    Home Living                          Prior Function            PT Goals (current goals can now be found in the care plan section) Acute Rehab PT Goals Patient Stated Goal: less pain so I can go home. PT Goal Formulation: With patient Time For Goal Achievement: 10/05/22 Potential to Achieve Goals: Good Progress towards PT goals: Progressing toward goals    Frequency    7X/week      PT Plan Current plan remains appropriate    Co-evaluation              AM-PAC PT "6 Clicks" Mobility   Outcome Measure  Help needed turning from your back to your side while in a flat bed without using bedrails?: A Little Help needed moving from lying on your back to sitting on the side of a flat bed without using bedrails?: A Lot Help needed moving to and from a bed to a chair (including a wheelchair)?: A Lot Help needed standing up from a chair using your arms (e.g., wheelchair or bedside chair)?: A Lot Help needed to walk in hospital room?: Total Help needed climbing 3-5 steps with a railing? : Total 6 Click Score: 11    End of Session   Activity Tolerance: Patient limited by pain Patient left: in bed;with call bell/phone within reach;with bed alarm set Nurse Communication: Mobility status;Precautions PT Visit Diagnosis: Other abnormalities of gait and mobility (R26.89);History of falling (Z91.81);Muscle weakness (generalized) (M62.81);Difficulty in walking, not elsewhere  classified (R26.2)     Time: 7867-5449 PT Time Calculation (min) (ACUTE ONLY): 23 min  Charges:  $Therapeutic Activity: 23-37 mins                     Justin [name], PT, DPT, GCS [phone]    Justin [name] 09/23/2022, 10:48 AM

## 2022-09-23 NOTE — Progress Notes (Addendum)
  Progress Note   Patient: Justin [name] HYW:[id number] DOB: 1941/07/25 DOA: 09/20/2022     0 DOS: the patient was seen and examined on 09/23/2022   Brief hospital course: MALEKO GREULICH is a 82 y.o. male with medical history significant of hypertension, hyperlipidemia, COPD on 2 L oxygen, CAD, CABG, systolic CHF with EF of 48-54%, gout, GERD, prostate cancer, BPH, PAD, anemia, L2 compression fracture (S/p of kyphoplasty of L2), s/p of augmented compression fractures T7 through T12, who presents with fall and back pain.  Patient has a chronic vertigo, he became very dizzy prior to the fall.  He sustained a L3 compression fracture.  He is admitted to hospital for further monitoring. Patient is seen by PT/OT, need nursing home placement.  However, patient could not afford self-pay.  Assessment and Plan: Closed compression fracture of L3 vertebrae. Rib fracture. Fall due to vertigo Chronic vertigo. Patient still have significant pain in the back as well as bilateral legs.  X-ray of the legs did not show any acute changes.  Patient still require pain medicine.  LSO placed. Patient has been seen by neurosurgery, no need for surgery at this time.  The patient still have significant pain, was seen by PT/OT again, deemed necessary for nursing home placement.  Patient decided to go to nursing home with self-pay, currently pending bed offer.   Chronic atrial fibrillation. Continue beta-blocker and Eliquis.   Essential hypertension. Continue beta-blocker.   Chronic systolic congestive heart failure. BNP minimally elevated, no evidence of exacerbation.   COPD. Continue home treatment, no bronchospasm.   Hypokalemia. Give another 40 mEq of KCl orally for K of 3.5.  Recheck a BMP and mag tomorrow.   Chronic respiratory failure. OSA Patient was chronically on trilogy at nighttime. Discussed with Dr. Vella Kohler, patient will be on bipap of 16/10, with rate of 10, 2L of O2.       Subjective:   Patient still has significant pain in the back, not able to ambulate.  He tolerated BiPAP last night.  Physical Exam: Vitals:   09/22/22 1948 09/22/22 2119 09/23/22 0428 09/23/22 0851  BP: 96/62  112/78 (!) 142/82  Pulse: 74  65 67  Resp: '16  18 16  '$ Temp: 98.4 F (36.9 C)  97.6 F (36.4 C) 97.8 F (36.6 C)  TempSrc: Oral  Oral   SpO2: 97% 96% 94% 96%  Weight:      Height:       General exam: Appears calm and comfortable  Respiratory system: Clear to auscultation. Respiratory effort normal. Cardiovascular system: Irregular. No JVD, murmurs, rubs, gallops or clicks. No pedal edema. Gastrointestinal system: Abdomen is nondistended, soft and nontender. No organomegaly or masses felt. Normal bowel sounds heard. Central nervous system: Alert and oriented. No focal neurological deficits. Extremities: Symmetric 5 x 5 power. Skin: No rashes, lesions or ulcers Psychiatry: Judgement and insight appear normal. Mood & affect appropriate.   Data Reviewed:  Lab results reviewed.  Family Communication: None  Disposition: Status is: Observation   Planned Discharge Destination: Skilled nursing facility    Time spent: 35 minutes  Author: Sharen Hones, MD 09/23/2022 12:15 PM  For on call review [email or web address].

## 2022-09-23 NOTE — Progress Notes (Signed)
Occupational Therapy Treatment Patient Details Name: Justin [name] MRN: [id number] DOB: 1941-07-30 Today's Date: 09/23/2022   History of present illness Pt. is an 82 y.o. male with medical history significant of hypertension, hyperlipidemia, COPD on 2 L oxygen, CAD, CABG, systolic CHF with EF of 66-44%, gout, GERD, prostate cancer, BPH, PAD, anemia, L2 compression fracture (S/p of kyphoplasty of L2), s/p of augmented compression fractures T7 through T12, who presents with fall and back pain.The patient states that he lost his balance and fell into the corner between the wall and floor on 3 days ago. No LOC. He states he also hit his head but does not have headache or neck pain at this time. He developed pain in lower back, right rib cage,right shoulder. The pain he lower back is the worst, which is constant, severe, radiating to both legs bilaterally. Patient states that initially he was able to get up and walk around, but in the past 2 days, he has worsening pain, now is unable to get out of bed this morning due to severe pain. X-Ray revealed closed compression Fx L3.   OT comments  Pt. education was provided about A/E use for LE ADLs. Pt. reports 6/10 back pain at rest. Pt. SpO2 93% on 2LO2, HR 76 bpms. Pt. education was provided about pursed lip breathing, and the techniques were reviewed with the Pt. Pt. continues to benefit from OT services for ADL training, continued A/E training, and pt. education about home modification, and DME. Pt.will benefit from OT services at the next level of care.    Recommendations for follow up therapy are one component of a multi-disciplinary discharge planning process, led by the attending physician.  Recommendations may be updated based on patient status, additional functional criteria and insurance authorization.    Follow Up Recommendations  Follow physician's recommendations for discharge plan and follow up therapies     Assistance Recommended at Discharge     Patient can return home with the following  A lot of help with walking and/or transfers;A lot of help with bathing/dressing/bathroom;Assistance with cooking/housework;Assist for transportation;Help with stairs or ramp for entrance   Equipment Recommendations       Recommendations for Other Services      Precautions / Restrictions Precautions Precautions: Fall Precaution Comments: HX OF VERTIGO. Required Braces or Orthoses: Spinal Brace Restrictions Weight Bearing Restrictions: No Other Position/Activity Restrictions: Brace when OOB.       Mobility Bed Mobility               General bed mobility comments: Deferred 2/2 pain    Transfers                   General transfer comment: Deferred 2/2 pain     Balance                                           ADL either performed or assessed with clinical judgement   ADL Overall ADL's : Needs assistance/impaired                                            Extremity/Trunk Assessment Upper Extremity Assessment Upper Extremity Assessment: Generalized weakness            Vision Patient Visual Report:  No change from baseline     Perception     Praxis      Cognition Arousal/Alertness: Awake/alert Behavior During Therapy: WFL for tasks assessed/performed Overall Cognitive Status: Within Functional Limits for tasks assessed                                 General Comments: Pt is A and O x 4        Exercises      Shoulder Instructions       General Comments      Pertinent Vitals/ Pain       Pain Assessment Pain Assessment: 0-10 Pain Score: 6  (At rest) Pain Location: R LE > L LE Pain Descriptors / Indicators: Aching, Radiating Pain Intervention(s): Limited activity within patient's tolerance, Premedicated before session, Repositioned  Home Living                                          Prior Functioning/Environment               Frequency  Min 2X/week        Progress Toward Goals  OT Goals(current goals can now be found in the care plan section)  Progress towards OT goals: Progressing toward goals  Acute Rehab OT Goals Patient Stated Goal: To feel better OT Goal Formulation: With patient Time For Goal Achievement: 10/05/22  Plan      Co-evaluation                 AM-PAC OT "6 Clicks" Daily Activity     Outcome Measure   Help from another person eating meals?: None Help from another person taking care of personal grooming?: A Little Help from another person toileting, which includes using toliet, bedpan, or urinal?: A Lot Help from another person bathing (including washing, rinsing, drying)?: A Lot Help from another person to put on and taking off regular upper body clothing?: A Lot Help from another person to put on and taking off regular lower body clothing?: A Lot 6 Click Score: 15    End of Session Equipment Utilized During Treatment: Rolling walker (2 wheels)  OT Visit Diagnosis: Unsteadiness on feet (R26.81);Pain;Muscle weakness (generalized) (M62.81)   Activity Tolerance Patient limited by pain   Patient Left in bed;with call bell/phone within reach;with bed alarm set;with family/visitor present   Nurse Communication          Time: 1601-0932 OT Time Calculation (min): 18 min  Charges: OT General Charges $OT Visit: 1 Visit OT Treatments $Self Care/Home Management : 8-22 mins  Harrel Carina, MS, OTR/L   Harrel Carina 09/23/2022, 4:59 PM

## 2022-09-24 DIAGNOSIS — G4733 Obstructive sleep apnea (adult) (pediatric): Secondary | ICD-10-CM | POA: Diagnosis present

## 2022-09-24 DIAGNOSIS — Y92008 Other place in unspecified non-institutional (private) residence as the place of occurrence of the external cause: Secondary | ICD-10-CM | POA: Diagnosis not present

## 2022-09-24 DIAGNOSIS — E785 Hyperlipidemia, unspecified: Secondary | ICD-10-CM | POA: Diagnosis present

## 2022-09-24 DIAGNOSIS — Z8546 Personal history of malignant neoplasm of prostate: Secondary | ICD-10-CM | POA: Diagnosis not present

## 2022-09-24 DIAGNOSIS — Z85828 Personal history of other malignant neoplasm of skin: Secondary | ICD-10-CM | POA: Diagnosis not present

## 2022-09-24 DIAGNOSIS — Z87891 Personal history of nicotine dependence: Secondary | ICD-10-CM | POA: Diagnosis not present

## 2022-09-24 DIAGNOSIS — E876 Hypokalemia: Secondary | ICD-10-CM | POA: Diagnosis present

## 2022-09-24 DIAGNOSIS — Z9981 Dependence on supplemental oxygen: Secondary | ICD-10-CM | POA: Diagnosis not present

## 2022-09-24 DIAGNOSIS — Z955 Presence of coronary angioplasty implant and graft: Secondary | ICD-10-CM | POA: Diagnosis not present

## 2022-09-24 DIAGNOSIS — M109 Gout, unspecified: Secondary | ICD-10-CM | POA: Diagnosis present

## 2022-09-24 DIAGNOSIS — S32030G Wedge compression fracture of third lumbar vertebra, subsequent encounter for fracture with delayed healing: Secondary | ICD-10-CM | POA: Diagnosis not present

## 2022-09-24 DIAGNOSIS — E871 Hypo-osmolality and hyponatremia: Secondary | ICD-10-CM | POA: Diagnosis present

## 2022-09-24 DIAGNOSIS — I739 Peripheral vascular disease, unspecified: Secondary | ICD-10-CM | POA: Diagnosis present

## 2022-09-24 DIAGNOSIS — I2510 Atherosclerotic heart disease of native coronary artery without angina pectoris: Secondary | ICD-10-CM | POA: Diagnosis present

## 2022-09-24 DIAGNOSIS — S32030A Wedge compression fracture of third lumbar vertebra, initial encounter for closed fracture: Secondary | ICD-10-CM | POA: Diagnosis not present

## 2022-09-24 DIAGNOSIS — S32030S Wedge compression fracture of third lumbar vertebra, sequela: Secondary | ICD-10-CM | POA: Diagnosis not present

## 2022-09-24 DIAGNOSIS — M545 Low back pain, unspecified: Secondary | ICD-10-CM | POA: Diagnosis present

## 2022-09-24 DIAGNOSIS — I252 Old myocardial infarction: Secondary | ICD-10-CM | POA: Diagnosis not present

## 2022-09-24 DIAGNOSIS — M810 Age-related osteoporosis without current pathological fracture: Secondary | ICD-10-CM | POA: Diagnosis present

## 2022-09-24 DIAGNOSIS — I5022 Chronic systolic (congestive) heart failure: Secondary | ICD-10-CM | POA: Diagnosis present

## 2022-09-24 DIAGNOSIS — J439 Emphysema, unspecified: Secondary | ICD-10-CM | POA: Diagnosis present

## 2022-09-24 DIAGNOSIS — I700 Atherosclerosis of aorta: Secondary | ICD-10-CM | POA: Diagnosis present

## 2022-09-24 DIAGNOSIS — W010XXA Fall on same level from slipping, tripping and stumbling without subsequent striking against object, initial encounter: Secondary | ICD-10-CM | POA: Diagnosis present

## 2022-09-24 DIAGNOSIS — Z951 Presence of aortocoronary bypass graft: Secondary | ICD-10-CM | POA: Diagnosis not present

## 2022-09-24 DIAGNOSIS — J9610 Chronic respiratory failure, unspecified whether with hypoxia or hypercapnia: Secondary | ICD-10-CM | POA: Diagnosis present

## 2022-09-24 DIAGNOSIS — I110 Hypertensive heart disease with heart failure: Secondary | ICD-10-CM | POA: Diagnosis present

## 2022-09-24 DIAGNOSIS — T502X5A Adverse effect of carbonic-anhydrase inhibitors, benzothiadiazides and other diuretics, initial encounter: Secondary | ICD-10-CM | POA: Diagnosis present

## 2022-09-24 DIAGNOSIS — I482 Chronic atrial fibrillation, unspecified: Secondary | ICD-10-CM | POA: Diagnosis present

## 2022-09-24 DIAGNOSIS — S32038A Other fracture of third lumbar vertebra, initial encounter for closed fracture: Secondary | ICD-10-CM | POA: Diagnosis present

## 2022-09-24 DIAGNOSIS — S2231XA Fracture of one rib, right side, initial encounter for closed fracture: Secondary | ICD-10-CM | POA: Diagnosis present

## 2022-09-24 LAB — BASIC METABOLIC PANEL
Anion gap: 11 (ref 5–15)
BUN: 26 mg/dL — ABNORMAL HIGH (ref 8–23)
CO2: 33 mmol/L — ABNORMAL HIGH (ref 22–32)
Calcium: 9.3 mg/dL (ref 8.9–10.3)
Chloride: 90 mmol/L — ABNORMAL LOW (ref 98–111)
Creatinine, Ser: 1.16 mg/dL (ref 0.61–1.24)
GFR, Estimated: >60 mL/min (ref >60)
Glucose, Bld: 100 mg/dL — ABNORMAL HIGH (ref 70–99)
Potassium: 4.4 mmol/L (ref 3.5–5.1)
Sodium: 134 mmol/L — ABNORMAL LOW (ref 135–145)

## 2022-09-24 LAB — MAGNESIUM: Magnesium: 2.6 mg/dL — ABNORMAL HIGH (ref 1.7–2.4)

## 2022-09-24 MED ORDER — HYDROMORPHONE HCL 1 MG/ML IJ SOLN
1.0000 mg | INTRAMUSCULAR | Status: DC | PRN
  Administered 2022-09-24 – 2022-09-29 (×6): 1 mg via INTRAVENOUS
  Filled 2022-09-24 (×6): qty 1

## 2022-09-24 NOTE — Progress Notes (Signed)
Patient tolerated new Bipap settings (15/10, 10R, 28%) well. Patient did state the pressure was "a little much, but tolerable". Gecko pad placed on patient's nose for comfort. Patient wore Bipap throughout the night.

## 2022-09-24 NOTE — Progress Notes (Addendum)
  Progress Note   Patient: Justin [name] GLO:[id number] DOB: [birth year] DOA: 09/20/2022     0 DOS: the patient was seen and examined on 09/23/2022   Brief hospital course: Justin [name] is a 82 y.o. male with medical history significant of hypertension, hyperlipidemia, COPD on 2 L oxygen, CAD, CABG, systolic CHF with EF of 18-84%, gout, GERD, prostate cancer, BPH, PAD, anemia, L2 compression fracture (S/p of kyphoplasty of L2), s/p of augmented compression fractures T7 through T12, who presents with fall and back pain.  Patient has a chronic vertigo, he became very dizzy prior to the fall.  He sustained a L3 compression fracture.  He is admitted to hospital for further monitoring. Patient is seen by PT/OT, need nursing home placement.  However, patient could not afford self-pay.  Assessment and Plan: Closed compression fracture of L3 vertebrae. Rib fracture. Fall due to vertigo Chronic vertigo. Patient still have significant pain in the back as well as bilateral legs as well as some numbness right lateral leg.  X-ray of the legs did not show any acute changes.  Patient still require pain medicine.  LSO placed for comfort. Patient has been seen by neurosurgery, no need for surgery at this time.  The patient still have significant pain, was seen by PT/OT again, deemed necessary for nursing home placement. Patient reports he has benefited from kyphoplasty in the past and is interested in pursuing that, will discuss w/ IR.   Chronic atrial fibrillation. Rate controlled Continue beta-blocker  Will hold apixaban pending IR kyphoplasty consult   Essential hypertension. BP appropriate Continue beta-blocker.   Chronic systolic congestive heart failure. BNP minimally elevated, no evidence of exacerbation.   COPD Continue home treatment, no bronchospasm.. cont home o2 2L   Hypokalemia. Resolved w/ supplementation   Chronic respiratory failure. OSA Patient was chronically on trilogy at  nighttime. Discussed with Dr. Vella Kohler, patient will be on bipap of 16/10, with rate of 10, 2L of O2.       Subjective:  Ongoing pain worse in right leg w/ intermittent numbness  Physical Exam: Vitals:   09/22/22 1948 09/22/22 2119 09/23/22 0428 09/23/22 0851  BP: 96/62  112/78 (!) 142/82  Pulse: 74  65 67  Resp: '16  18 16  '$ Temp: 98.4 F (36.9 C)  97.6 F (36.4 C) 97.8 F (36.6 C)  TempSrc: Oral  Oral   SpO2: 97% 96% 94% 96%  Weight:      Height:       General exam: Appears calm and comfortable  Respiratory system: Clear to auscultation. Respiratory effort normal. Cardiovascular system: Irregular. No JVD, murmurs, rubs, gallops or clicks.  Gastrointestinal system: Abdomen is nondistended, soft and nontender. No organomegaly or masses felt. Normal bowel sounds heard. Central nervous system: Alert and oriented. No focal neurological deficits. Moving all 4 Extremities: warm, no edema Skin: No rashes, lesions or ulcers Psychiatry: Judgement and insight appear normal. Mood & affect appropriate.   Data Reviewed:  Lab results reviewed.  Family Communication: wife judy updated telephonically 1/3  Disposition: Status is: Observation   Planned Discharge Destination: Skilled nursing facility    Time spent: 35 minutes  Author: Laurey Arrow, MD 09/23/2022 12:15 PM  For on call review [email or web address].

## 2022-09-24 NOTE — Progress Notes (Signed)
Physical Therapy Treatment Patient Details Name: Justin [name] MRN: [id number] DOB: [birth year] Today's Date: 09/24/2022   History of Present Illness Pt. is an 82 y.o. male with medical history significant of hypertension, hyperlipidemia, COPD on 2 L oxygen, CAD, CABG, systolic CHF with EF of 08-65%, gout, GERD, prostate cancer, BPH, PAD, anemia, L2 compression fracture (S/p of kyphoplasty of L2), s/p of augmented compression fractures T7 through T12, who presents with fall and back pain.The patient states that he lost his balance and fell into the corner between the wall and floor on 3 days ago. No LOC. He states he also hit his head but does not have headache or neck pain at this time. He developed pain in lower back, right rib cage,right shoulder. The pain he lower back is the worst, which is constant, severe, radiating to both legs bilaterally. Patient states that initially he was able to get up and walk around, but in the past 2 days, he has worsening pain, now is unable to get out of bed this morning due to severe pain. X-Rie Mcneil revealed closed compression Fx L3.    PT Comments    Pt is making limited progress, still limited by pain. Reports pain at 20/10 with any movement. Premedicated by oxy and PRN pain meds. Sent secure chat to care team regarding pain levels in addition to new numbness in R lateral thigh and foot. Able to stand, however heavy WBing through B UE with inability to stand through R LE. Will continue to progress. LSO brace donned for all mobility.   Recommendations for follow up therapy are one component of a multi-disciplinary discharge planning process, led by the attending physician.  Recommendations may be updated based on patient status, additional functional criteria and insurance authorization.  Follow Up Recommendations  Skilled nursing-short term rehab (<3 hours/day) Can patient physically be transported by private vehicle: No   Assistance Recommended at Discharge Frequent  or constant Supervision/Assistance  Patient can return home with the following A lot of help with walking and/or transfers;A lot of help with bathing/dressing/bathroom;Assistance with cooking/housework;Assist for transportation;Help with stairs or ramp for entrance   Equipment Recommendations  Rolling walker (2 wheels)    Recommendations for Other Services       Precautions / Restrictions Precautions Precautions: Fall Precaution Comments: HX OF VERTIGO. Required Braces or Orthoses: Spinal Brace Spinal Brace: Lumbar corset;Applied in sitting position Restrictions Weight Bearing Restrictions: No Other Position/Activity Restrictions: Brace when OOB.     Mobility  Bed Mobility Overal bed mobility: Needs Assistance Bed Mobility: Rolling, Sidelying to Sit, Sit to Sidelying   Sidelying to sit: Min assist     Sit to sidelying: Min assist General bed mobility comments: needs assist for trunkal elevation. Once seated, post lean secondary to pain. Antalgic posture    Transfers Overall transfer level: Needs assistance Equipment used: Rolling walker (2 wheels) Transfers: Sit to/from Stand Sit to Stand: Mod assist           General transfer comment: required bed to be elevated and heavy assist. RW used with unable to WB through R LE. Heavy WBing through B UE    Ambulation/Gait               General Gait Details: unable   Stairs             Wheelchair Mobility    Modified Rankin (Stroke Patients Only)       Balance Overall balance assessment: Needs assistance Sitting-balance support: Feet supported, Bilateral  upper extremity supported Sitting balance-Leahy Scale: Fair     Standing balance support: Bilateral upper extremity supported, Reliant on assistive device for balance Standing balance-Leahy Scale: Fair                              Cognition Arousal/Alertness: Awake/alert Behavior During Therapy: WFL for tasks  assessed/performed Overall Cognitive Status: Within Functional Limits for tasks assessed                                 General Comments: Pt is A and O x 4        Exercises Other Exercises Other Exercises: supine ther-ex performed on R LE including SAQ, QS, SLRs, and hip abd/add. 10 reps with max assist secondary to pain.    General Comments        Pertinent Vitals/Pain Pain Assessment Pain Assessment: 0-10 Pain Score: 10-Worst pain ever Pain Location: R LE > L LE Pain Descriptors / Indicators: Aching, Radiating Pain Intervention(s): Limited activity within patient's tolerance, Premedicated before session, Repositioned    Home Living                          Prior Function            PT Goals (current goals can now be found in the care plan section) Acute Rehab PT Goals Patient Stated Goal: less pain so I can go home. PT Goal Formulation: With patient Time For Goal Achievement: 10/05/22 Potential to Achieve Goals: Good Progress towards PT goals: Progressing toward goals    Frequency    7X/week      PT Plan Current plan remains appropriate    Co-evaluation              AM-PAC PT "6 Clicks" Mobility   Outcome Measure  Help needed turning from your back to your side while in a flat bed without using bedrails?: A Little Help needed moving from lying on your back to sitting on the side of a flat bed without using bedrails?: A Lot Help needed moving to and from a bed to a chair (including a wheelchair)?: A Lot Help needed standing up from a chair using your arms (e.g., wheelchair or bedside chair)?: A Lot Help needed to walk in hospital room?: Total Help needed climbing 3-5 steps with a railing? : Total 6 Click Score: 11    End of Session   Activity Tolerance: Patient limited by pain Patient left: in bed;with call bell/phone within reach;with bed alarm set Nurse Communication: Mobility status;Precautions PT Visit Diagnosis:  Other abnormalities of gait and mobility (R26.89);History of falling (Z91.81);Muscle weakness (generalized) (M62.81);Difficulty in walking, not elsewhere classified (R26.2)     Time: 6010-9323 PT Time Calculation (min) (ACUTE ONLY): 27 min  Charges:  $Therapeutic Exercise: 8-22 mins $Therapeutic Activity: 8-22 mins                     Greggory Stallion, PT, DPT, GCS [phone]    Leovardo Thoman 09/24/2022, 10:59 AM

## 2022-09-24 NOTE — Plan of Care (Signed)

## 2022-09-24 NOTE — TOC Progression Note (Addendum)
Transition of Care [facility]) - Progression Note    Patient Details  Name: Justin [name] MRN: [id number] Date of Birth: 1940-12-03  Transition of Care [facility]) CM/SW [place], LCSW Phone Number: 09/24/2022, 10:48 AM  Clinical Narrative:  Per PT, patient with high levels of pain while working with them today including numbness in right leg. Met with patient and let him know that [facility] had also offered a bed. They only have semiprivate rooms available. $265 per day = $7950 up front. They will bill part B for therapy and part D for medications. Bipap costs $17.25 per day = $517.50 per month. Wrote this information on CMS list but patient had fallen asleep before it could be provided.  1:28 pm: Patient has switched to inpatient status. Medicare would cover SNF placement as long as he meets medical necessity to be inpatient for 3 midnights. Earliest discharge date would be Saturday.  Expected Discharge Plan: [place] Barriers to Discharge: Continued Medical Work up  Expected Discharge Plan and [place] arrangements for the past 2 months: [place]: PT [place]: [facility] Date [place]: 09/21/22 Time [place]: 1400 Representative spoke with at [place]: [place] [phone]   Social Determinants of Health ([place]) Interventions [place]: No Food Insecurity (09/21/2022)  Housing: Low Risk  (09/21/2022)  Transportation Needs: No Transportation Needs (09/21/2022)  Utilities: Not At Risk (09/21/2022)  Tobacco Use: Medium Risk (09/20/2022)    Readmission Risk Interventions     No data to display

## 2022-09-25 DIAGNOSIS — S32030A Wedge compression fracture of third lumbar vertebra, initial encounter for closed fracture: Secondary | ICD-10-CM | POA: Diagnosis not present

## 2022-09-25 LAB — BASIC METABOLIC PANEL
Anion gap: 11 (ref 5–15)
BUN: 31 mg/dL — ABNORMAL HIGH (ref 8–23)
CO2: 35 mmol/L — ABNORMAL HIGH (ref 22–32)
Calcium: 9.6 mg/dL (ref 8.9–10.3)
Chloride: 86 mmol/L — ABNORMAL LOW (ref 98–111)
Creatinine, Ser: 1.28 mg/dL — ABNORMAL HIGH (ref 0.61–1.24)
GFR, Estimated: 56 mL/min — ABNORMAL LOW (ref >60)
Glucose, Bld: 129 mg/dL — ABNORMAL HIGH (ref 70–99)
Potassium: 3.5 mmol/L (ref 3.5–5.1)
Sodium: 132 mmol/L — ABNORMAL LOW (ref 135–145)

## 2022-09-25 MED ORDER — LACTULOSE 10 GM/15ML PO SOLN
20.0000 g | Freq: Every day | ORAL | Status: DC
  Administered 2022-09-25 – 2022-09-28 (×4): 20 g via ORAL
  Filled 2022-09-25 (×6): qty 30

## 2022-09-25 MED ORDER — CEFAZOLIN SODIUM-DEXTROSE 2-4 GM/100ML-% IV SOLN
2.0000 g | INTRAVENOUS | Status: AC
  Administered 2022-09-26: 2 g via INTRAVENOUS

## 2022-09-25 MED ORDER — ALUM & MAG HYDROXIDE-SIMETH 200-200-20 MG/5ML PO SUSP: 15.0000 mL | ORAL | Status: DC | PRN

## 2022-09-25 NOTE — TOC Progression Note (Addendum)
Transition of Care [facility]) - Progression Note    Patient Details  Name: Justin [name] MRN: [id number] Date of Birth: 04-27-1941  Transition of Care [facility]) CM/SW [place], LCSW Phone Number: 09/25/2022, 9:50 AM  Clinical Narrative:  Sent SNF referral back out to determine updated bed offers.   10:54 am: Updated patient.  Expected Discharge Plan: [place] Barriers to Discharge: Continued Medical Work up  Expected Discharge Plan and [place] arrangements for the past 2 months: [place]: PT [place]: [facility] Date [place]: 09/21/22 Time [place]: 1400 Representative spoke with at [place]: [place] [phone]   Social Determinants of Health ([place]) Interventions [place]: No Food Insecurity (09/21/2022)  Housing: Low Risk  (09/21/2022)  Transportation Needs: No Transportation Needs (09/21/2022)  Utilities: Not At Risk (09/21/2022)  Tobacco Use: Medium Risk (09/20/2022)    Readmission Risk Interventions     No data to display

## 2022-09-25 NOTE — Progress Notes (Signed)
Physical Therapy Treatment Patient Details Name: Justin [name] MRN: [id number] DOB: 1941-04-10 Today's Date: 09/25/2022   History of Present Illness Pt. is an 82 y.o. male with medical history significant of hypertension, hyperlipidemia, COPD on 2 L oxygen, CAD, CABG, systolic CHF with EF of 94-17%, gout, GERD, prostate cancer, BPH, PAD, anemia, L2 compression fracture (S/p of kyphoplasty of L2), s/p of augmented compression fractures T7 through T12, who presents with fall and back pain.The patient states that he lost his balance and fell into the corner between the wall and floor on 3 days ago. No LOC. He states he also hit his head but does not have headache or neck pain at this time. He developed pain in lower back, right rib cage,right shoulder. The pain he lower back is the worst, which is constant, severe, radiating to both legs bilaterally. Patient states that initially he was able to get up and walk around, but in the past 2 days, he has worsening pain, now is unable to get out of bed this morning due to severe pain. [name] revealed closed compression Fx L3.    PT Comments    Pt is limited progress towards goals secondary to severe pain. Per chart, pending SX next date. Bed mobility improved, however transfers severely limited. Unable to ambulate this date. Will continue to progress.   Recommendations for follow up therapy are one component of a multi-disciplinary discharge planning process, led by the attending physician.  Recommendations may be updated based on patient status, additional functional criteria and insurance authorization.  Follow Up Recommendations  Skilled nursing-short term rehab (<3 hours/day) Can patient physically be transported by private vehicle: No   Assistance Recommended at Discharge Frequent or constant Supervision/Assistance  Patient can return home with the following A lot of help with walking and/or transfers;A lot of help with  bathing/dressing/bathroom;Assistance with cooking/housework;Assist for transportation;Help with stairs or ramp for entrance   Equipment Recommendations  Rolling walker (2 wheels)    Recommendations for Other Services       Precautions / Restrictions Precautions Precautions: Fall Precaution Comments: HX OF VERTIGO. Required Braces or Orthoses: Spinal Brace Spinal Brace: Lumbar corset;Applied in sitting position Restrictions Weight Bearing Restrictions: No Other Position/Activity Restrictions: Brace when OOB.     Mobility  Bed Mobility Overal bed mobility: Needs Assistance   Rolling: Min guard Sidelying to sit: Min guard     Sit to sidelying: Min guard General bed mobility comments: log roll technique with B knee flexed for improved sequencing. Still needs slight assist for trunkal elevation, however improved from previous session. Once seated, brace donned    Transfers Overall transfer level: Needs assistance Equipment used: Rolling walker (2 wheels) Transfers: Sit to/from Stand Sit to Stand: Mod assist           General transfer comment: poor standing tolerance with sharp R back and hip pain. Unable to Wb through R LE and promptly returned supine.    Ambulation/Gait               General Gait Details: unable to tolerate   Stairs             Wheelchair Mobility    Modified Rankin (Stroke Patients Only)       Balance Overall balance assessment: Needs assistance Sitting-balance support: Feet supported, Bilateral upper extremity supported Sitting balance-Leahy Scale: Fair     Standing balance support: Bilateral upper extremity supported, Reliant on assistive device for balance Standing balance-Leahy Scale: Fair  Cognition Arousal/Alertness: Awake/alert Behavior During Therapy: WFL for tasks assessed/performed Overall Cognitive Status: Within Functional Limits for tasks assessed                                  General Comments: very pleasant and agreeable to session        Exercises Other Exercises Other Exercises: supine ther-ex reviewed and performed with supervision. heel slides, hip abd/add, and QS. 10 reps. Attempted bridging, however unable to tolerate. Also educated on UE HEP    General Comments        Pertinent Vitals/Pain Pain Assessment Pain Assessment: 0-10 Pain Score: 9  Pain Location: R LE > L LE Pain Descriptors / Indicators: Aching, Radiating Pain Intervention(s): Limited activity within patient's tolerance, Repositioned, Premedicated before session    Home Living                          Prior Function            PT Goals (current goals can now be found in the care plan section) Acute Rehab PT Goals Patient Stated Goal: less pain so I can go home. PT Goal Formulation: With patient Time For Goal Achievement: 10/05/22 Potential to Achieve Goals: Good Progress towards PT goals: Progressing toward goals    Frequency    7X/week      PT Plan Current plan remains appropriate    Co-evaluation              AM-PAC PT "6 Clicks" Mobility   Outcome Measure  Help needed turning from your back to your side while in a flat bed without using bedrails?: A Little Help needed moving from lying on your back to sitting on the side of a flat bed without using bedrails?: A Lot Help needed moving to and from a bed to a chair (including a wheelchair)?: A Lot Help needed standing up from a chair using your arms (e.g., wheelchair or bedside chair)?: A Lot Help needed to walk in hospital room?: Total Help needed climbing 3-5 steps with a railing? : Total 6 Click Score: 11    End of Session Equipment Utilized During Treatment: Gait belt Activity Tolerance: Patient limited by pain Patient left: in bed;with call bell/phone within reach;with bed alarm set Nurse Communication: Mobility status;Precautions PT Visit Diagnosis: Other  abnormalities of gait and mobility (R26.89);History of falling (Z91.81);Muscle weakness (generalized) (M62.81);Difficulty in walking, not elsewhere classified (R26.2)     Time: 7829-5621 PT Time Calculation (min) (ACUTE ONLY): 24 min  Charges:  $Therapeutic Exercise: 8-22 mins $Therapeutic Activity: 8-22 mins                     Greggory Stallion, PT, DPT, GCS [phone]    Miranda Frese 09/25/2022, 3:44 PM

## 2022-09-25 NOTE — Progress Notes (Signed)
  Progress Note   Patient: Justin [name] GDJ:[id number] DOB: Oct 17, 1940 DOA: 09/20/2022     1 DOS: the patient was seen and examined on 09/25/2022   Brief hospital course: Justin [name] is a 82 y.o. male with medical history significant of hypertension, hyperlipidemia, COPD on 2 L oxygen, CAD, CABG, systolic CHF with EF of 62-22%, gout, GERD, prostate cancer, BPH, PAD, anemia, L2 compression fracture (S/p of kyphoplasty of L2), s/p of augmented compression fractures T7 through T12, who presents with fall and back pain.  Patient has a chronic vertigo, he became very dizzy prior to the fall.  He sustained a L3 compression fracture.  He is admitted to hospital for further monitoring. Patient is seen by PT/OT, need nursing home placement.  However, patient could not afford self-pay.  Assessment and Plan: Closed compression fracture of L3 vertebrae. Rib fracture. Fall due to vertigo Chronic vertigo. Patient still have significant pain in the back as well as bilateral legs as well as some numbness right lateral leg.  X-ray of the legs did not show any acute changes.  Patient still require pain medicine.  LSO placed for comfort. Patient has been seen by neurosurgery, no need for neurosurgery at this time.  The patient still have significant pain, was seen by PT/OT again, deemed necessary for nursing home placement.  Patient reports he has benefited from kyphoplasty in the past and is interested in pursuing that, IR has been consulted, plan is kyphoplasty tomorrow    Chronic atrial fibrillation. Rate controlled Continue beta-blocker  Holding apixaban for IR kyphoplasty    Essential hypertension. BP appropriate Continue beta-blocker.   Chronic systolic congestive heart failure. BNP minimally elevated, no evidence of exacerbation.   COPD Continue home treatment, no bronchospasm.. cont home o2 2L   Hypokalemia. Resolved w/ supplementation   Chronic respiratory failure. OSA Patient was  chronically on trilogy at nighttime, on 2-3 liters during the day. Discussed with Dr. Vella Kohler, patient will be on bipap of 16/10, with rate of 10, 2L of O2.   Constipation Reports no BM for a week - will start lactulose      Subjective:  Ongoing pain worse in right leg and low back  Physical Exam: Vitals:   09/24/22 2101 09/25/22 0352 09/25/22 0406 09/25/22 0758  BP:  101/68  112/67  Pulse:  67  72  Resp:  15  17  Temp:  97.6 F (36.4 C)  97.9 F (36.6 C)  TempSrc:  Oral  Oral  SpO2: 94% 97%  96%  Weight:   64.7 kg   Height:       General exam: Appears calm and comfortable  Respiratory system: Clear to auscultation. Respiratory effort normal. Cardiovascular system: Irregular. No JVD, murmurs, rubs, gallops or clicks.  Gastrointestinal system: Abdomen is nondistended, soft and nontender. No organomegaly or masses felt. Normal bowel sounds heard. Central nervous system: Alert and oriented. No focal neurological deficits. Moving all 4 Extremities: warm, no edema Skin: No rashes, lesions or ulcers Psychiatry: Judgement and insight appear normal. Mood & affect appropriate.   Data Reviewed:  Lab results reviewed.  Family Communication: wife Justin [name] updated telephonically 1/4  Disposition: Status is: inpt   Planned Discharge Destination: Skilled nursing facility    Time spent: 35 minutes  Author: Laurey Arrow, MD 09/25/2022 1:09 PM  For on call review [email or web address].

## 2022-09-25 NOTE — Consult Note (Signed)
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 clinical consultation, greater than 50% of which was counseling/coordinating care for L3 vertebral fracture.

## 2022-09-26 ENCOUNTER — Inpatient Hospital Stay: Payer: Medicare Other | Admitting: Radiology

## 2022-09-26 DIAGNOSIS — S32030G Wedge compression fracture of third lumbar vertebra, subsequent encounter for fracture with delayed healing: Secondary | ICD-10-CM | POA: Diagnosis not present

## 2022-09-26 HISTORY — PX: IR KYPHO LUMBAR INC FX REDUCE BONE BX UNI/BIL CANNULATION INC/IMAGING: IMG5519

## 2022-09-26 LAB — CBC WITH DIFFERENTIAL/PLATELET
Abs Immature Granulocytes: 0.04 10*3/uL (ref 0.00–0.07)
Basophils Absolute: 0 10*3/uL (ref 0.0–0.1)
Basophils Relative: 1 %
Eosinophils Absolute: 0.3 10*3/uL (ref 0.0–0.5)
Eosinophils Relative: 3 %
HCT: 41.9 % (ref 39.0–52.0)
Hemoglobin: 13.7 g/dL (ref 13.0–17.0)
Immature Granulocytes: 1 %
Lymphocytes Relative: 19 %
Lymphs Abs: 1.5 10*3/uL (ref 0.7–4.0)
MCH: 33.2 pg (ref 26.0–34.0)
MCHC: 32.7 g/dL (ref 30.0–36.0)
MCV: 101.5 fL — ABNORMAL HIGH (ref 80.0–100.0)
Monocytes Absolute: 1.1 10*3/uL — ABNORMAL HIGH (ref 0.1–1.0)
Monocytes Relative: 14 %
Neutro Abs: 4.6 10*3/uL (ref 1.7–7.7)
Neutrophils Relative %: 62 %
Platelets: 158 10*3/uL (ref 150–400)
RBC: 4.13 MIL/uL — ABNORMAL LOW (ref 4.22–5.81)
RDW: 13 % (ref 11.5–15.5)
WBC: 7.5 10*3/uL (ref 4.0–10.5)
nRBC: 0 % (ref 0.0–0.2)

## 2022-09-26 LAB — BASIC METABOLIC PANEL
Anion gap: 10 (ref 5–15)
BUN: 25 mg/dL — ABNORMAL HIGH (ref 8–23)
CO2: 35 mmol/L — ABNORMAL HIGH (ref 22–32)
Calcium: 8.8 mg/dL — ABNORMAL LOW (ref 8.9–10.3)
Chloride: 83 mmol/L — ABNORMAL LOW (ref 98–111)
Creatinine, Ser: 1.06 mg/dL (ref 0.61–1.24)
GFR, Estimated: >60 mL/min (ref >60)
Glucose, Bld: 92 mg/dL (ref 70–99)
Potassium: 3.4 mmol/L — ABNORMAL LOW (ref 3.5–5.1)
Sodium: 128 mmol/L — ABNORMAL LOW (ref 135–145)

## 2022-09-26 LAB — PROTIME-INR
INR: 1.1 (ref 0.8–1.2)
Prothrombin Time: 14.1 seconds (ref 11.4–15.2)

## 2022-09-26 MED ORDER — LIDOCAINE HCL (PF) 1 % IJ SOLN
INTRAMUSCULAR | Status: AC
  Administered 2022-09-26: 18 mL
  Filled 2022-09-26: qty 30

## 2022-09-26 MED ORDER — FENTANYL CITRATE (PF) 100 MCG/2ML IJ SOLN
INTRAMUSCULAR | Status: AC
  Filled 2022-09-26: qty 2

## 2022-09-26 MED ORDER — MIDAZOLAM HCL 2 MG/2ML IJ SOLN
INTRAMUSCULAR | Status: AC
  Filled 2022-09-26: qty 2

## 2022-09-26 MED ORDER — FENTANYL CITRATE (PF) 100 MCG/2ML IJ SOLN
INTRAMUSCULAR | Status: AC | PRN
  Administered 2022-09-26 (×2): 25 ug via INTRAVENOUS

## 2022-09-26 MED ORDER — CEFAZOLIN SODIUM-DEXTROSE 2-4 GM/100ML-% IV SOLN
INTRAVENOUS | Status: AC
  Filled 2022-09-26: qty 100

## 2022-09-26 MED ORDER — MIDAZOLAM HCL 2 MG/2ML IJ SOLN
INTRAMUSCULAR | Status: AC | PRN
  Administered 2022-09-26: .5 mg via INTRAVENOUS

## 2022-09-26 MED ORDER — SODIUM CHLORIDE 0.9 % IV SOLN: INTRAVENOUS | Status: DC

## 2022-09-26 NOTE — Progress Notes (Signed)
OT Cancellation Note  Patient Details Name: Justin [name] MRN: [id number] DOB: [birth year]   Cancelled Treatment:    Reason Eval/Treat Not Completed: Medical issues which prohibited therapy. Per chart review and communication with RN, plan for kyphoplasty today. OT to complete orders and will await new orders after  surgery.  Darleen Crocker, MS, OTR/L , CBIS ascom [phone]  09/26/22, 10:19 AM

## 2022-09-26 NOTE — Progress Notes (Signed)
Patient clinically stable post L3 Kyphoplasty per Dr Denna Haggard, tolerated well. Vitals as noted, denies complaints post procedure. Received Versed 0.5 mg along with Fentanyl 50 mcg IV for procedure. Noting minimal meds secondarily to patients respiratory status/ETCO2, as charted. Patient awake/alert and oriented post procedure. Report given to [facility] RN /330.

## 2022-09-26 NOTE — Plan of Care (Signed)
  Problem: Nutrition: Goal: Adequate nutrition will be maintained Outcome: Progressing   Problem: Coping: Goal: Level of anxiety will decrease Outcome: Progressing   Problem: Elimination: Goal: Will not experience complications related to urinary retention Outcome: Progressing   Problem: Skin Integrity: Goal: Risk for impaired skin integrity will decrease Outcome: Progressing

## 2022-09-26 NOTE — Progress Notes (Signed)
Progress Note   Patient: Justin [name] YBO:[id number] DOB: 07-07-1941 DOA: 09/20/2022     2 DOS: the patient was seen and examined on 09/26/2022   Brief hospital course: Justin [name] is a 82 y.o. male with medical history significant of hypertension, hyperlipidemia, COPD on 2 L oxygen, CAD, CABG, systolic CHF with EF of 27-78%, gout, GERD, prostate cancer, BPH, PAD, anemia, L2 compression fracture (S/p of kyphoplasty of L2), s/p of augmented compression fractures T7 through T12, who presents with fall and back pain.  Patient has a chronic vertigo, he became very dizzy prior to the fall.  He sustained a L3 compression fracture.  He is admitted to hospital for further monitoring. Patient is seen by PT/OT, need nursing home placement.  However, patient could not afford self-pay.  Assessment and Plan: Closed compression fracture of L3 vertebrae. Rib fracture. Fall due to vertigo Chronic vertigo. Patient still have significant pain in the back as well as bilateral legs as well as some numbness right lateral leg.  X-ray of the legs did not show any acute changes.  Patient still require pain medicine.  LSO placed for comfort. Patient has been seen by neurosurgery, no need for neurosurgery at this time.  The patient still have significant pain, was seen by PT/OT again, deemed necessary for nursing home placement.  Patient reports he has benefited from kyphoplasty in the past and is interested in pursuing that, IR has been consulted, successful L3 kyphoplasty performed today   Chronic atrial fibrillation. Rate controlled Continue beta-blocker  Holding apixaban for IR kyphoplasty   Hyponatremia Chronic problem, here sodium wnl on admission but 128 today, chloride low and bicarb up, suspect diuretic side effect, will hold it   Essential hypertension. BP appropriate Continue beta-blocker.   Chronic systolic congestive heart failure. BNP minimally elevated, no evidence of exacerbation. - holding  torsemide as above   COPD Continue home treatment, no bronchospasm.. cont home o2 2L   Hypokalemia. Resolved w/ supplementation   Chronic respiratory failure. OSA Patient was chronically on trilogy at nighttime, on 2-3 liters during the day. Discussed with Dr. Vella Kohler, patient will be on bipap of 16/10, with rate of 10, 2L of O2.   Constipation Reports no BM for a week - will continue lactulose, enema tomorrow if no bm in next 24 hours      Subjective:  Tolerated procedure  Physical Exam: Vitals:   09/26/22 1215 09/26/22 1230 09/26/22 1245 09/26/22 1306  BP: 118/73 109/74 109/74 105/72  Pulse: 63 63 60 (!) 58  Resp: '17 15 12 16  '$ Temp:    97.9 F (36.6 C)  TempSrc:    Oral  SpO2: 94% 94% 95% 97%  Weight:      Height:       General exam: Appears calm and comfortable  Respiratory system: Clear to auscultation. Respiratory effort normal. Cardiovascular system: Irregular. No JVD, murmurs, rubs, gallops or clicks.  Gastrointestinal system: Abdomen is nondistended, soft and nontender. No organomegaly or masses felt. Normal bowel sounds heard. Central nervous system: Alert and oriented. No focal neurological deficits. Moving all 4 Extremities: warm, no edema Skin: No rashes, lesions or ulcers Psychiatry: Judgement and insight appear normal. Mood & affect appropriate.   Data Reviewed:  Lab results reviewed.  Family Communication: wife judy updated telephonically 1/5  Disposition: Status is: inpt   Planned Discharge Destination: Skilled nursing facility    Time spent: 35 minutes  Author: Laurey Arrow, MD 09/26/2022 1:56 PM  For on call  review [email or web address].

## 2022-09-26 NOTE — Procedures (Signed)
Interventional Radiology Procedure Note  Date of Procedure: 09/26/2022  Procedure: L3 Kyphoplasty   Findings:  1. Successful L3 kyphoplasty    Complications: No immediate complications noted.   Estimated Blood Loss: minimal  Follow-up and Recommendations: 1. Bedrest 2 hours    Albin Felling, MD  Vascular & Interventional Radiology  09/26/2022 12:00 PM

## 2022-09-26 NOTE — Plan of Care (Signed)
  Problem: Health Behavior/Discharge Planning: Goal: Ability to manage health-related needs will improve Outcome: Progressing   

## 2022-09-26 NOTE — Care Management Important Message (Signed)
Important Message  Patient Details  Name: Justin [name] MRN: [id number] Date of Birth: 1940/10/08   Medicare Important Message Given:  N/A - LOS <3 / Initial given by admissions     Juliann Pulse A Liahna Brickner 09/26/2022, 8:53 AM

## 2022-09-26 NOTE — TOC Progression Note (Signed)
Transition of Care [facility]) - Progression Note    Patient Details  Name: RONDALL RADIGAN MRN: [id number] Date of Birth: [birth year]  Transition of Care [facility]) CM/SW [place], LCSW Phone Number: 09/26/2022, 11:14 AM  Clinical Narrative:   Gave patient updated bed offers.  Expected Discharge Plan: [place] Barriers to Discharge: Continued Medical Work up  Expected Discharge Plan and [place] arrangements for the past 2 months: [place]: PT [place]: [facility] Date [place]: 09/21/22 Time [place]: 1400 Representative spoke with at [place]: [place] [phone]   Social Determinants of Health ([place]) Interventions [place]: No Food Insecurity (09/21/2022)  Housing: Low Risk  (09/21/2022)  Transportation Needs: No Transportation Needs (09/21/2022)  Utilities: Not At Risk (09/21/2022)  Tobacco Use: Medium Risk (09/20/2022)    Readmission Risk Interventions     No data to display

## 2022-09-27 DIAGNOSIS — S32030G Wedge compression fracture of third lumbar vertebra, subsequent encounter for fracture with delayed healing: Secondary | ICD-10-CM | POA: Diagnosis not present

## 2022-09-27 LAB — BASIC METABOLIC PANEL WITH GFR
Anion gap: 10 (ref 5–15)
BUN: 22 mg/dL (ref 8–23)
CO2: 35 mmol/L — ABNORMAL HIGH (ref 22–32)
Calcium: 9.1 mg/dL (ref 8.9–10.3)
Chloride: 85 mmol/L — ABNORMAL LOW (ref 98–111)
Creatinine, Ser: 0.98 mg/dL (ref 0.61–1.24)
GFR, Estimated: >60 mL/min
Glucose, Bld: 103 mg/dL — ABNORMAL HIGH (ref 70–99)
Potassium: 3.6 mmol/L (ref 3.5–5.1)
Sodium: 130 mmol/L — ABNORMAL LOW (ref 135–145)

## 2022-09-27 MED ORDER — APIXABAN 5 MG PO TABS
5.0000 mg | ORAL_TABLET | Freq: Two times a day (BID) | ORAL | Status: DC
  Administered 2022-09-27 – 2022-09-29 (×4): 5 mg via ORAL
  Filled 2022-09-27 (×4): qty 1

## 2022-09-27 MED ORDER — FLEET ENEMA 7-19 GM/118ML RE ENEM
1.0000 | ENEMA | Freq: Once | RECTAL | Status: AC
  Administered 2022-09-27: 1 via RECTAL

## 2022-09-27 NOTE — TOC Progression Note (Signed)
Transition of Care [facility]) - Progression Note    Patient Details  Name: Justin [name] MRN: [id number] Date of Birth: 01/21/1941  Transition of Care [facility] [place]) CM/SW Contact  [address] [place], [place] Phone Number: 09/27/2022, 3:24 PM  Clinical Narrative:    This Education officer, museum met with patient to review bed offers. Patient has not made decision to date however is agreeable to SNF at this time. Patient has also spoken with wife -she is also agreeable. Patient continues to review bed offers. This Education officer, museum to meet with patient again in the morning to finalize SNF decision.  Saleen Peden, LCSW Transition of Care    Expected Discharge Plan: [place] Barriers to Discharge: Continued Medical Work up  Expected Discharge Plan and [place] arrangements for the past 2 months: [place]: PT [place]: [facility] Date [place]: 09/21/22 Time [place]: 1400 Representative spoke with at [place]: [place] [phone]   Social Determinants of Health ([place]) Interventions [place]: No Food Insecurity (09/21/2022)  Housing: Low Risk  (09/21/2022)  Transportation Needs: No Transportation Needs (09/21/2022)  Utilities: Not At Risk (09/21/2022)  Tobacco Use: Medium Risk (09/26/2022)    Readmission Risk Interventions     No data to display

## 2022-09-27 NOTE — Plan of Care (Signed)

## 2022-09-27 NOTE — Progress Notes (Signed)
ANTICOAGULATION CONSULT NOTE - Initial Consult  Pharmacy Consult for Resuming Apixaban Indication: atrial fibrillation  Allergies  Allergen Reactions   Hydrochlorothiazide W-Triamterene Nausea Only    Patient Measurements: Height: '5\' 7"'$  (170.2 cm) Weight: 64.7 kg (142 lb 10.2 oz) IBW/kg (Calculated) : 66.1 Heparin Dosing Weight:    Vital Signs: Temp: 97.6 F (36.4 C) (01/06 0758) Temp Source: Axillary (01/06 0758) BP: 115/73 (01/06 0758) Pulse Rate: 66 (01/06 0758)  Labs: Recent Labs    09/25/22 0709 09/26/22 0530 09/26/22 0849 09/27/22 0458  HGB  --  13.7  --   --   HCT  --  41.9  --   --   PLT  --  158  --   --   LABPROT  --   --  14.1  --   INR  --   --  1.1  --   CREATININE 1.28* 1.06  --  0.98    Estimated Creatinine Clearance: 54.1 mL/min (by C-G formula based on SCr of 0.98 mg/dL).   Medical History: Past Medical History:  Diagnosis Date   Adenomatous polyps    Anginal pain ([place])    Aortic atherosclerosis ([place])    Arthritis    Atrial fibrillation (HCC)    a.) CHA2DS2-VASc = 4 (age x 2, HTN, previous MI). b.) rate/rhythm maintained on oral metorprolol tartrate; beyond ASA, no chronic anticoagulation.   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 neck. SCCis   Wears dentures    partial upper and lower    Assessment: Patient was taking Apixaban '5mg'$  bid for afib prior to admission.   Plan:  Will resume home dose of Apixaban '5mg'$  bid.  Paulina Fusi, PharmD, BCPS 09/27/2022 2:21 PM

## 2022-09-27 NOTE — Evaluation (Signed)
Physical Therapy Re-Evaluation Patient Details Name: Justin [name] MRN: [id number] DOB: 1940-11-04 Today's Date: 09/27/2022  History of Present Illness  Pt. is an 82 y.o. male with medical history significant of hypertension, hyperlipidemia, COPD on 2 L oxygen, CAD, CABG, systolic CHF with EF of 75-64%, gout, GERD, prostate cancer, BPH, PAD, anemia, L2 compression fracture (S/p of kyphoplasty of L2), s/p of augmented compression fractures T7 through T12, who presents with fall and back pain.The patient states that he lost his balance and fell into the corner between the wall and floor on 3 days ago. No LOC. He states he also hit his head but does not have headache or neck pain at this time. He developed pain in lower back, right rib cage,right shoulder. The pain he lower back is the worst, which is constant, severe, radiating to both legs bilaterally. Patient states that initially he was able to get up and walk around, but in the past 2 days, he has worsening pain, now is unable to get out of bed this morning due to severe pain. X-Ray revealed closed compression Fx L3. Pt is s/p L3 kyphoplasty on 09/26/22.  Clinical Impression  Pt seen for PT re-evaluation with pt agreeable to tx.  Pt appears very confused throughout session, doesn't recall procedure yesterday but after max cuing does recall having kyphoplasty; pt presents with very poor recall, awareness, & safety awareness. Pt requires mod<>max assist for all mobility & for log rolling, mod assist STS, & mod assist step pivot with RW. Pt c/o lightheadedness that doesn't subside even after sitting in recliner x 5 minutes; see below for vitals - nurse made aware. Pt would benefit from STR upon d/c to maximize independence with functional mobility & reduce fall risk prior to return home.  PT reviewed goals & updated all as appropriate.   Recommendations for follow up therapy are one component of a multi-disciplinary discharge planning process, led by the  attending physician.  Recommendations may be updated based on patient status, additional functional criteria and insurance authorization.  Follow Up Recommendations Skilled nursing-short term rehab (<3 hours/day) Can patient physically be transported by private vehicle: No    Assistance Recommended at Discharge Frequent or constant Supervision/Assistance  Patient can return home with the following  A lot of help with walking and/or transfers;A lot of help with bathing/dressing/bathroom;Assist for transportation;Assistance with cooking/housework;Help with stairs or ramp for entrance    Equipment Recommendations Rolling walker (2 wheels)  Recommendations for Other Services  OT consult    Functional Status Assessment Patient has had a recent decline in their functional status and demonstrates the ability to make significant improvements in function in a reasonable and predictable amount of time.     Precautions / Restrictions Precautions Precautions: Fall;Back Required Braces or Orthoses: Spinal Brace Spinal Brace: Lumbar corset;Applied in sitting position Restrictions Weight Bearing Restrictions: No      Mobility  Bed Mobility Overal bed mobility: Needs Assistance Bed Mobility: Rolling Rolling: Mod assist Sidelying to sit: Max assist       General bed mobility comments: Max cuing for log rolling technique, assistance to upright trunk, use of bed rails, HOB elevated.    Transfers Overall transfer level: Needs assistance Equipment used: Rolling walker (2 wheels) Transfers: Sit to/from Stand Sit to Stand: Mod assist   Step pivot transfers: Mod assist       General transfer comment: Posterior lean in standing during step pivot transfer.    Ambulation/Gait  Stairs            Wheelchair Mobility    Modified Rankin (Stroke Patients Only)       Balance Overall balance assessment: Needs assistance Sitting-balance support: Feet  supported, Bilateral upper extremity supported Sitting balance-Leahy Scale: Fair Sitting balance - Comments: supervision static sitting   Standing balance support: Bilateral upper extremity supported, Reliant on assistive device for balance, During functional activity Standing balance-Leahy Scale: Poor                               Pertinent Vitals/Pain Pain Assessment Pain Assessment: Faces Faces Pain Scale: Hurts a little bit Pain Location: R shoulder Pain Descriptors / Indicators: Discomfort, Guarding Pain Intervention(s): Monitored during session (MD made aware)    Home Living Family/patient expects to be discharged to:: Private residence Living Arrangements: Spouse/significant other Available Help at Discharge: Available 24 hours/day Type of Home: House Home Access: Elevator       Home Layout: Two level;Able to live on main level with bedroom/bathroom Home Equipment: Rolling Walker (2 wheels);Cane - single point;Shower seat      Prior Function Prior Level of Function : Independent/Modified Independent             Mobility Comments: Ambulates IND w/ RW or SPC ADLs Comments: Largely IND in ADLs, drives. Receives PRN assist w/ bathing     Hand Dominance        Extremity/Trunk Assessment   Upper Extremity Assessment Upper Extremity Assessment: Generalized weakness    Lower Extremity Assessment Lower Extremity Assessment: Generalized weakness (2+/5 knee extension BLE in sitting)    Cervical / Trunk Assessment Cervical / Trunk Assessment: Kyphotic (LSO donned sitting)  Communication   Communication: No difficulties  Cognition Arousal/Alertness: Awake/alert Behavior During Therapy: WFL for tasks assessed/performed Overall Cognitive Status: No family/caregiver present to determine baseline cognitive functioning                                 General Comments: Pt confused throughout session, requires extra time to follow simple  commands. Poor recall, memory, awareness.        General Comments General comments (skin integrity, edema, etc.): Pt on 2L/min via nasal cannula with SPO2 >90%, BP in LUE after sitting in recliner ~5 minutes after transfer 91/64 mmHg MAP 73, HR 68 bpm    Exercises     Assessment/Plan    PT Assessment Patient needs continued PT services  PT Problem List Decreased strength;Cardiopulmonary status limiting activity;Pain;Decreased range of motion;Decreased cognition;Decreased activity tolerance;Decreased balance;Decreased mobility;Decreased knowledge of precautions;Decreased safety awareness;Decreased knowledge of use of DME       PT Treatment Interventions DME instruction;Therapeutic exercise;Gait training;Balance training;Stair training;Neuromuscular re-education;Functional mobility training;Cognitive remediation;Therapeutic activities;Patient/family education;Modalities    PT Goals (Current goals can be found in the Care Plan section)  Acute Rehab PT Goals Patient Stated Goal: none stated PT Goal Formulation: With patient Time For Goal Achievement: 10/11/22 Potential to Achieve Goals: Fair    Frequency 7X/week     Co-evaluation               AM-PAC PT "6 Clicks" Mobility  Outcome Measure Help needed turning from your back to your side while in a flat bed without using bedrails?: A Lot Help needed moving from lying on your back to sitting on the side of a flat bed without using bedrails?: Total Help  needed moving to and from a bed to a chair (including a wheelchair)?: Total Help needed standing up from a chair using your arms (e.g., wheelchair or bedside chair)?: A Lot Help needed to walk in hospital room?: Total Help needed climbing 3-5 steps with a railing? : Total 6 Click Score: 8    End of Session Equipment Utilized During Treatment: Back brace Activity Tolerance: Patient tolerated treatment well (limited by c/o dizziness) Patient left: in chair;with chair alarm  set;with call bell/phone within reach;with nursing/sitter in room Nurse Communication: Mobility status;Precautions (BP) PT Visit Diagnosis: Unsteadiness on feet (R26.81);Muscle weakness (generalized) (M62.81);Difficulty in walking, not elsewhere classified (R26.2);Pain Pain - Right/Left: Right Pain - part of body: Shoulder    Time: 4742-5956 PT Time Calculation (min) (ACUTE ONLY): 23 min   Charges:   PT Evaluation $PT Re-evaluation: 1 Re-eval PT Treatments $Therapeutic Activity: 8-22 mins        Lavone Nian, PT, DPT 09/27/22, 4:25 PM   Waunita Schooner 09/27/2022, 4:23 PM

## 2022-09-27 NOTE — Progress Notes (Signed)
Progress Note   Patient: Justin [name] [id number]:[id number] DOB: [birth year] DOA: 09/20/2022     3 DOS: the patient was seen and examined on 09/27/2022   Brief hospital course: Justin [name] is a 82 y.o. male with medical history significant of hypertension, hyperlipidemia, COPD on 2 L oxygen, CAD, CABG, systolic CHF with EF of 09-47%, gout, GERD, prostate cancer, BPH, PAD, anemia, L2 compression fracture (S/p of kyphoplasty of L2), s/p of augmented compression fractures T7 through T12, who presents with fall and back pain.  Patient has a chronic vertigo, he became very dizzy prior to the fall.  He sustained a L3 compression fracture.  He is admitted to hospital for further monitoring. Patient is seen by PT/OT, need nursing home placement.  However, patient could not afford self-pay.  Assessment and Plan: Closed compression fracture of L3 vertebrae. Rib fracture. Fall due to vertigo Chronic vertigo. Patient still have significant pain in the back as well as bilateral legs as well as some numbness right lateral leg.  X-ray of the legs did not show any acute changes.  Patient still require pain medicine.  LSO placed for comfort. Patient has been seen by neurosurgery, no need for neurosurgery at this time.  The patient still have significant pain, was seen by PT/OT again, deemed necessary for nursing home placement.  Patient reports he has benefited from kyphoplasty in the past and is interested in pursuing that, IR has been consulted, successful L3 kyphoplasty performed 1/5, now pending snf placement   Chronic atrial fibrillation. Rate controlled Continue beta-blocker  Resume apixaban  Hyponatremia Chronic problem, here sodium wnl on admission but 128 on 1/5, chloride low and bicarb up, suspect diuretic side effect, will hold it, today sodium improved to 130   Essential hypertension. BP appropriate Continue beta-blocker.   Chronic systolic congestive heart failure. BNP minimally elevated, no  evidence of exacerbation. - holding torsemide as above   COPD quiescent   Hypokalemia. Resolved w/ supplementation   Chronic respiratory failure. OSA Patient was chronically on trilogy at nighttime, on 2-3 liters during the day. Discussed with Dr. Vella Kohler, patient will be on bipap of 16/10, with rate of 10, 2L of O2.   Constipation Reports no BM for a week, 2 days of oral lactulose not effective - will arrange enema w/ nursing      Subjective:  No bm. Eating lunch. Pain stable  Physical Exam: Vitals:   09/26/22 1959 09/26/22 2053 09/27/22 0438 09/27/22 0758  BP: 94/68  104/72 115/73  Pulse: 80  66 66  Resp: '18  16 17  '$ Temp: 98.1 F (36.7 C)  99 F (37.2 C) 97.6 F (36.4 C)  TempSrc:    Axillary  SpO2: 94% 95% 96% 97%  Weight:      Height:       General exam: Appears calm and comfortable  Respiratory system: Clear to auscultation. Respiratory effort normal. Cardiovascular system: Irregular. No JVD, murmurs, rubs, gallops or clicks.  Gastrointestinal system: Abdomen is nondistended, soft and nontender. No organomegaly or masses felt. Normal bowel sounds heard. Central nervous system: Alert and oriented. No focal neurological deficits. Moving all 4 Extremities: warm, no edema Skin: No rashes, lesions or ulcers Psychiatry: Judgement and insight appear normal. Mood & affect appropriate.   Data Reviewed:  Lab results reviewed.  Family Communication: wife judy updated telephonically 1/6  Disposition: Status is: inpt   Planned Discharge Destination: Skilled nursing facility    Time spent: 35 minutes  Author: Laurey Arrow, MD  09/27/2022 2:02 PM  For on call review [email or web address].

## 2022-09-28 DIAGNOSIS — S32030G Wedge compression fracture of third lumbar vertebra, subsequent encounter for fracture with delayed healing: Secondary | ICD-10-CM | POA: Diagnosis not present

## 2022-09-28 LAB — OSMOLALITY, URINE: Osmolality, Ur: 334 mOsm/kg (ref 300–900)

## 2022-09-28 LAB — CBC
HCT: 35.9 % — ABNORMAL LOW (ref 39.0–52.0)
Hemoglobin: 12 g/dL — ABNORMAL LOW (ref 13.0–17.0)
MCH: 32.6 pg (ref 26.0–34.0)
MCHC: 33.4 g/dL (ref 30.0–36.0)
MCV: 97.6 fL (ref 80.0–100.0)
Platelets: 164 10*3/uL (ref 150–400)
RBC: 3.68 MIL/uL — ABNORMAL LOW (ref 4.22–5.81)
RDW: 12.5 % (ref 11.5–15.5)
WBC: 11.5 10*3/uL — ABNORMAL HIGH (ref 4.0–10.5)
nRBC: 0 % (ref 0.0–0.2)

## 2022-09-28 LAB — TSH: TSH: 1.151 u[IU]/mL (ref 0.350–4.500)

## 2022-09-28 LAB — BASIC METABOLIC PANEL
Anion gap: 11 (ref 5–15)
BUN: 15 mg/dL (ref 8–23)
CO2: 32 mmol/L (ref 22–32)
Calcium: 9.1 mg/dL (ref 8.9–10.3)
Chloride: 83 mmol/L — ABNORMAL LOW (ref 98–111)
Creatinine, Ser: 0.79 mg/dL (ref 0.61–1.24)
GFR, Estimated: >60 mL/min (ref >60)
Glucose, Bld: 95 mg/dL (ref 70–99)
Potassium: 3.3 mmol/L — ABNORMAL LOW (ref 3.5–5.1)
Sodium: 126 mmol/L — ABNORMAL LOW (ref 135–145)

## 2022-09-28 LAB — OSMOLALITY: Osmolality: 271 mOsm/kg — ABNORMAL LOW (ref 275–295)

## 2022-09-28 LAB — SODIUM, URINE, RANDOM: Sodium, Ur: <10 mmol/L

## 2022-09-28 MED ORDER — SORBITOL 70 % SOLN
960.0000 mL | TOPICAL_OIL | Freq: Once | ORAL | Status: DC
  Filled 2022-09-28: qty 240

## 2022-09-28 NOTE — Evaluation (Signed)
Occupational Therapy Evaluation Patient Details Name: Justin [name] MRN: [id number] DOB: October 12, 1940 Today's Date: 09/28/2022   History of Present Illness Pt is a 82 year old male admitted with a L3 compression fracture after fall, now s/p L3 kyphoplasty; PMH significant for hypertension, hyperlipidemia, COPD on 2 L oxygen, CAD, CABG, systolic CHF with EF of 44-03%, gout, GERD, prostate cancer, BPH, PAD, anemia, L2 compression fracture (S/p of kyphoplasty of L2), s/p of augmented compression fractures T7 through T12   Clinical Impression   Chart reviewed, pt greeted in bed agreeable to OT re-evaluation kyphoplasty. Pt is alert and oriented x4, increased time for processing with ?awareness of current level of deficits. Goals and POC updated as appropriate. Pt continues to present with deficits in strength, endurance,a activity tolerance, balance all affecting safe and optimal ADL completion. Improvements noted in tolerance for bed mobility with MIN A with HOB raised using appropriate body mechanics, MIN A for STS from bed, short amb transfer ro bedside chair with CGA-MIN A. MAX A required for LB dressing. Brace donned throughout. Wife reports she would prefer to take patient home, education provided re: recommendations, need for max assist for ADLs at this time, role of rehab in a SNF/STR setting. Recommend discharge to STR to address functional deficits. OT will continue to follow acutely.      Recommendations for follow up therapy are one component of a multi-disciplinary discharge planning process, led by the attending physician.  Recommendations may be updated based on patient status, additional functional criteria and insurance authorization.   Follow Up Recommendations  Skilled nursing-short term rehab (<3 hours/day)     Assistance Recommended at Discharge Frequent or constant Supervision/Assistance  Patient can return home with the following A lot of help with walking and/or transfers;A lot of  help with bathing/dressing/bathroom;Assistance with cooking/housework;Assist for transportation;Help with stairs or ramp for entrance    Functional Status Assessment  Patient has had a recent decline in their functional status and demonstrates the ability to make significant improvements in function in a reasonable and predictable amount of time.  Equipment Recommendations  BSC/3in1    Recommendations for Other Services       Precautions / Restrictions Precautions Precautions: Fall;Back Precaution Comments: HX OF VERTIGO. Required Braces or Orthoses: Spinal Brace Spinal Brace: Lumbar corset;Applied in sitting position (for comfort per neurosurgery note) Restrictions Weight Bearing Restrictions: No      Mobility Bed Mobility Overal bed mobility: Needs Assistance Bed Mobility: Rolling, Sidelying to Sit Rolling: Min assist Sidelying to sit: Mod assist       General bed mobility comments: step by step vcs for technique    Transfers Overall transfer level: Needs assistance Equipment used: Rolling walker (2 wheels) Transfers: Sit to/from Stand Sit to Stand: Min assist (regular bed height)                  Balance Overall balance assessment: Needs assistance Sitting-balance support: Feet supported, Bilateral upper extremity supported Sitting balance-Leahy Scale: Good     Standing balance support: Bilateral upper extremity supported, Reliant on assistive device for balance, During functional activity Standing balance-Leahy Scale: Poor                             ADL either performed or assessed with clinical judgement   ADL Overall ADL's : Needs assistance/impaired Eating/Feeding: Set up;Sitting   Grooming: Wash/dry face;Sitting;Set up           Upper  Body Dressing : Maximal assistance   Lower Body Dressing: Maximal assistance Lower Body Dressing Details (indicate cue type and reason): shoes Toilet Transfer: Min guard;Minimal  assistance;Rolling walker (2 wheels) Toilet Transfer Details (indicate cue type and reason): short amb transfer to bedside Toileting- Clothing Manipulation and Hygiene: Maximal assistance Toileting - Clothing Manipulation Details (indicate cue type and reason): after incontient of urine     Functional mobility during ADLs: Min guard;Minimal assistance       Vision Patient Visual Report: No change from baseline       Perception     Praxis      Pertinent Vitals/Pain Pain Assessment Pain Assessment: 0-10 Pain Score: 8  Pain Location: radiating down R leg Pain Descriptors / Indicators: Discomfort, Guarding Pain Intervention(s): Limited activity within patient's tolerance, Monitored during session, Repositioned     Hand Dominance     Extremity/Trunk Assessment Upper Extremity Assessment Upper Extremity Assessment: Generalized weakness RUE Deficits / Details: pain with R shoulder flexion RUE: Shoulder pain with ROM   Lower Extremity Assessment Lower Extremity Assessment: Generalized weakness   Cervical / Trunk Assessment Cervical / Trunk Assessment: Kyphotic   Communication Communication Communication: No difficulties   Cognition Arousal/Alertness: Awake/alert Behavior During Therapy: WFL for tasks assessed/performed Overall Cognitive Status: No family/caregiver present to determine baseline cognitive functioning Area of Impairment: Safety/judgement, Awareness, Problem solving                       Following Commands: Follows one step commands with increased time Safety/Judgement: Decreased awareness of deficits Awareness: Emergent Problem Solving: Slow processing, Requires verbal cues, Requires tactile cues       General Comments  Vital signs monitored, appear stable throughout    Exercises     Shoulder Instructions      Home Living Family/patient expects to be discharged to:: Private residence Living Arrangements: Spouse/significant  other Available Help at Discharge: Available 24 hours/day Type of Home: House Home Access: Elevator     Home Layout: Two level;Able to live on main level with bedroom/bathroom     Bathroom Shower/Tub: Occupational psychologist: Standard     Home Equipment: Conservation officer, nature (2 wheels);Cane - single point;Shower seat          Prior Functioning/Environment Prior Level of Function : Independent/Modified Independent;History of Falls (last six months)             Mobility Comments: amb with RW or SPC with MOD I, fall history ADLs Comments: MOD I with ADL except assist with bathing        OT Problem List: Decreased strength;Decreased range of motion;Decreased activity tolerance;Impaired balance (sitting and/or standing);Pain;Impaired UE functional use      OT Treatment/Interventions: Self-care/ADL training;Therapeutic exercise;Patient/family education;Balance training;Energy conservation;Therapeutic activities;DME and/or AE instruction    OT Goals(Current goals can be found in the care plan section) Acute Rehab OT Goals Patient Stated Goal: feel stronger OT Goal Formulation: With patient Time For Goal Achievement: 10/12/22 Potential to Achieve Goals: Good  OT Frequency: Min 2X/week    Co-evaluation              AM-PAC OT "6 Clicks" Daily Activity     Outcome Measure Help from another person eating meals?: None Help from another person taking care of personal grooming?: A Little Help from another person toileting, which includes using toliet, bedpan, or urinal?: A Lot Help from another person bathing (including washing, rinsing, drying)?: A Lot Help from another person to  put on and taking off regular upper body clothing?: A Lot Help from another person to put on and taking off regular lower body clothing?: A Lot 6 Click Score: 15   End of Session Equipment Utilized During Treatment: Rolling walker (2 wheels) Nurse Communication: Mobility status  Activity  Tolerance: Patient tolerated treatment well Patient left: with family/visitor present;in chair;with call bell/phone within reach  OT Visit Diagnosis: Unsteadiness on feet (R26.81);Pain;Muscle weakness (generalized) (M62.81) Pain - Right/Left: Right Pain - part of body: Leg                Time: 9692-4932 OT Time Calculation (min): 25 min Charges:  OT General Charges $OT Visit: 1 Visit OT Evaluation $OT Re-eval: 1 Re-eval OT Treatments $Self Care/Home Management : 8-22 mins $Therapeutic Activity: 8-22 mins  Shanon Payor, OTD OTR/L  09/28/22, 10:02 AM

## 2022-09-28 NOTE — Progress Notes (Signed)
PT Cancellation Note  Patient Details Name: Justin [name] MRN: [id number] DOB: [birth year]   Cancelled Treatment:    Reason Eval/Treat Not Completed: Patient declined, no reason specified. 1030: eating breakfast; 1145: toileting. Will attempt to try again as schedule allows.    [name] 09/28/2022, 12:35 PM

## 2022-09-28 NOTE — Progress Notes (Signed)
Physical Therapy Treatment Patient Details Name: Justin [name] MRN: [id number] DOB: 01-Feb-1941 Today's Date: 09/28/2022   History of Present Illness Pt is a 82 year old male admitted with a L3 compression fracture after fall, now s/p L3 kyphoplasty; PMH significant for hypertension, hyperlipidemia, COPD on 2 L oxygen, CAD, CABG, systolic CHF with EF of 97-02%, gout, GERD, prostate cancer, BPH, PAD, anemia, L2 compression fracture (S/p of kyphoplasty of L2), s/p of augmented compression fractures T7 through T12    PT Comments    The pt presents this session with improved mobility when compared to previous session, however he is still presenting with significant mobility deficits in bed mobility, transfers, and gait. At this time the pt was unable to tolerate in room gait training d/t limited functional endurance. The pt required +2 assistance for transfers and personal hygiene following toileting. At this time the patient is an excellent candidate for STR in order to optimize functional mobility. PT will continue to follow while inpatient.     Recommendations for follow up therapy are one component of a multi-disciplinary discharge planning process, led by the attending physician.  Recommendations may be updated based on patient status, additional functional criteria and insurance authorization.  Follow Up Recommendations  Skilled nursing-short term rehab (<3 hours/day) Can patient physically be transported by private vehicle: No   Assistance Recommended at Discharge Frequent or constant Supervision/Assistance  Patient can return home with the following Assist for transportation;Assistance with cooking/housework;Help with stairs or ramp for entrance;Two people to help with walking and/or transfers;Two people to help with bathing/dressing/bathroom;A lot of help with bathing/dressing/bathroom   Equipment Recommendations  Rolling walker (2 wheels)    Recommendations for Other Services        Precautions / Restrictions Precautions Precautions: Fall;Back Required Braces or Orthoses: Spinal Brace (for comfort) Spinal Brace:  (Not applied this session at the patient's request) Restrictions Weight Bearing Restrictions: No     Mobility  Bed Mobility Overal bed mobility: Needs Assistance Bed Mobility: Supine to Sit Rolling: Min assist Sidelying to sit: Mod assist Supine to sit: Mod assist     General bed mobility comments: Verbal cues for log roll technique    Transfers Overall transfer level: Needs assistance Equipment used: Rolling walker (2 wheels) Transfers: Sit to/from Stand Sit to Stand: Mod assist     Squat pivot transfers: Mod assist, +2 physical assistance     General transfer comment: Verbal cues for forward translation during sit<>stand. Pt unable to achieve full upright position with use of a RW for sit<>stand; decided on squat pivot to [name] for safety    Ambulation/Gait               General Gait Details: unable to tolerate gait due to fatigue this session.   Stairs             Wheelchair Mobility    Modified Rankin (Stroke Patients Only)       Balance Overall balance assessment: Needs assistance Sitting-balance support: Feet supported, Bilateral upper extremity supported Sitting balance-Leahy Scale: Good     Standing balance support: Bilateral upper extremity supported, Reliant on assistive device for balance, During functional activity Standing balance-Leahy Scale: Poor                              Cognition Arousal/Alertness: Awake/alert Behavior During Therapy: WFL for tasks assessed/performed Overall Cognitive Status: Within Functional Limits for tasks assessed  Exercises Other Exercises Other Exercises: Pt requiring +2 assistance for toileting. Mod A for static hold of squat, dependent for personal hygiene.    General Comments         Pertinent Vitals/Pain Pain Assessment Pain Assessment: Faces Faces Pain Scale: Hurts a little bit Pain Descriptors / Indicators: Discomfort, Sore Pain Intervention(s): Limited activity within patient's tolerance    Home Living                          Prior Function            PT Goals (current goals can now be found in the care plan section) Acute Rehab PT Goals Patient Stated Goal: return home with wife PT Goal Formulation: With patient/family Time For Goal Achievement: 10/11/22 Potential to Achieve Goals: Fair Progress towards PT goals: Progressing toward goals    Frequency    7X/week      PT Plan Current plan remains appropriate    Co-evaluation              AM-PAC PT "6 Clicks" Mobility   Outcome Measure  Help needed turning from your back to your side while in a flat bed without using bedrails?: A Little Help needed moving from lying on your back to sitting on the side of a flat bed without using bedrails?: A Lot Help needed moving to and from a bed to a chair (including a wheelchair)?: A Lot Help needed standing up from a chair using your arms (e.g., wheelchair or bedside chair)?: A Lot Help needed to walk in hospital room?: Total Help needed climbing 3-5 steps with a railing? : Total 6 Click Score: 11    End of Session Equipment Utilized During Treatment: Gait belt;Oxygen Activity Tolerance: Patient limited by fatigue Patient left: in bed;with nursing/sitter in room;with call bell/phone within reach;with bed alarm set Nurse Communication: Mobility status PT Visit Diagnosis: Unsteadiness on feet (R26.81);Muscle weakness (generalized) (M62.81);Difficulty in walking, not elsewhere classified (R26.2);Pain Pain - Right/Left: Right Pain - part of body: Shoulder     Time: 1330-1404 PT Time Calculation (min) (ACUTE ONLY): 34 min  Charges:  $Therapeutic Activity: 23-37 mins                     2:57 PM, 09/28/22 Justin [name] A. Justin [name] PT,  DPT Physical Therapist - [facility]    Justin [name] A Justin [name] 09/28/2022, 2:55 PM

## 2022-09-28 NOTE — TOC Progression Note (Signed)
Transition of Care [facility]) - Progression Note    Patient Details  Name: Justin [name] MRN: [id number] Date of Birth: August 14, 1941  Transition of Care [facility]) CM/SW Contact  [address] [place], [place] Phone Number: 09/28/2022, 11:07 AM  Clinical Narrative:    This Education officer, museum met with patient and his spouse at bedside. Patient and spouse now agree that patient return home with [facility]. Per patient's spouse, she will be able to provide care for patient at home verbalizing having a strong network of support from family. Patient  has no DME needs at this time.  TOC to continue to follow.   Bryton Romagnoli, LCSW Transition of Care        Expected Discharge Plan: [place] Barriers to Discharge: Continued Medical Work up  Expected Discharge Plan and [place] arrangements for the past 2 months: [place]: PT [place]: [facility] Date [facility]: 09/21/22 Time [place]: 1400 Representative spoke with at [place]: [place] [phone]   Social Determinants of Health ([place]) Interventions [place]: No Food Insecurity (09/21/2022)  Housing: Low Risk  (09/21/2022)  Transportation Needs: No Transportation Needs (09/21/2022)  Utilities: Not At Risk (09/21/2022)  Tobacco Use: Medium Risk (09/26/2022)    Readmission Risk Interventions     No data to display

## 2022-09-28 NOTE — Plan of Care (Signed)

## 2022-09-28 NOTE — Progress Notes (Signed)
Progress Note   Patient: Justin [name] UXL:[id number] DOB: December 07, 1940 DOA: 09/20/2022     4 DOS: the patient was seen and examined on 09/28/2022   Brief hospital course: Justin [name] is a 82 y.o. male with medical history significant of hypertension, hyperlipidemia, COPD on 2 L oxygen, CAD, CABG, systolic CHF with EF of 53-66%, gout, GERD, prostate cancer, BPH, PAD, anemia, L2 compression fracture (S/p of kyphoplasty of L2), s/p of augmented compression fractures T7 through T12, who presents with fall and back pain.  Patient has a chronic vertigo, he became very dizzy prior to the fall.  He sustained a L3 compression fracture.  He is admitted to hospital for further monitoring. Patient is seen by PT/OT, need nursing home placement.  However, patient could not afford self-pay.  Assessment and Plan: Closed compression fracture of L3 vertebrae. Rib fracture. Fall due to vertigo Chronic vertigo. Patient still have significant pain in the back as well as bilateral legs as well as some numbness right lateral leg.  X-ray of the legs did not show any acute changes.  Patient still require pain medicine.  LSO placed for comfort. Patient has been seen by neurosurgery, no need for neurosurgery at this time.  The patient still have significant pain, was seen by PT/OT again, deemed necessary for nursing home placement.  Patient reports he has benefited from kyphoplasty in the past and is interested in pursuing that, IR has been consulted, successful L3 kyphoplasty performed 1/5, now pending snf placement, though today patient and wife have decided to d/c home   Chronic atrial fibrillation. Rate controlled Continue beta-blocker  Resume apixaban  Hyponatremia Chronic problem, here sodium wnl on admission but 128 on 1/5, chloride low and bicarb up, suspect diuretic side effect, have held it. Sodium today 126, will w/u further   Essential hypertension. BP appropriate Continue beta-blocker.   Chronic  systolic congestive heart failure. BNP minimally elevated, no evidence of exacerbation. - holding torsemide as above   COPD quiescent   Hypokalemia. Resolved w/ supplementation   Chronic respiratory failure. OSA Patient was chronically on trilogy at nighttime, on 2-3 liters during the day. Discussed with Dr. Vella Kohler, patient will be on bipap of 16/10, with rate of 10, 2L of O2.   Constipation Finally several decent BMs today, patient relieved - continue bowel regimen      Subjective:  Feeling better after BMs  Physical Exam: Vitals:   09/28/22 0324 09/28/22 0326 09/28/22 0741 09/28/22 1543  BP: 126/75  114/68 109/70  Pulse: 71  71 73  Resp: '18  16 18  '$ Temp: 98.6 F (37 C)  98.2 F (36.8 C) 98.4 F (36.9 C)  TempSrc: Oral  Oral Oral  SpO2: 97%  97% 98%  Weight:  69.1 kg    Height:       General exam: Appears calm and comfortable  Respiratory system: Clear to auscultation. Respiratory effort normal. Cardiovascular system: Irregular. No JVD, murmurs, rubs, gallops or clicks.  Gastrointestinal system: Abdomen is nondistended, soft and nontender. No organomegaly or masses felt.   Central nervous system: Alert and oriented. No focal neurological deficits. Moving all 4 Extremities: warm, no edema Skin: No rashes, lesions or ulcers Psychiatry: Judgement and insight appear normal. Mood & affect appropriate.   Data Reviewed:  Lab results reviewed.  Family Communication: wife judy updated telephonically 1/7  Disposition: Status is: inpt   Planned Discharge Destination: home with home health    Time spent: 25 minutes  Author: Laurey Arrow,  MD 09/28/2022 4:04 PM  For on call review [email or web address].

## 2022-09-28 NOTE — Progress Notes (Signed)
Pt had multiple large bowel movements today.

## 2022-09-29 DIAGNOSIS — S32030G Wedge compression fracture of third lumbar vertebra, subsequent encounter for fracture with delayed healing: Secondary | ICD-10-CM | POA: Diagnosis not present

## 2022-09-29 LAB — CORTISOL-AM, BLOOD: Cortisol - AM: 11.7 ug/dL (ref 6.7–22.6)

## 2022-09-29 LAB — BASIC METABOLIC PANEL
Anion gap: 9 (ref 5–15)
BUN: 13 mg/dL (ref 8–23)
CO2: 34 mmol/L — ABNORMAL HIGH (ref 22–32)
Calcium: 8.6 mg/dL — ABNORMAL LOW (ref 8.9–10.3)
Chloride: 80 mmol/L — ABNORMAL LOW (ref 98–111)
Creatinine, Ser: 0.69 mg/dL (ref 0.61–1.24)
GFR, Estimated: >60 mL/min (ref >60)
Glucose, Bld: 94 mg/dL (ref 70–99)
Potassium: 3 mmol/L — ABNORMAL LOW (ref 3.5–5.1)
Sodium: 123 mmol/L — ABNORMAL LOW (ref 135–145)

## 2022-09-29 MED ORDER — POTASSIUM CHLORIDE CRYS ER 20 MEQ PO TBCR
60.0000 meq | EXTENDED_RELEASE_TABLET | Freq: Once | ORAL | Status: AC
  Administered 2022-09-29: 60 meq via ORAL
  Filled 2022-09-29: qty 3

## 2022-09-29 MED ORDER — METOCLOPRAMIDE HCL 5 MG/ML IJ SOLN
10.0000 mg | Freq: Once | INTRAMUSCULAR | Status: DC
  Filled 2022-09-29: qty 2

## 2022-09-29 MED ORDER — METHOCARBAMOL 500 MG PO TABS: 500.0000 mg | ORAL_TABLET | Freq: Three times a day (TID) | ORAL | Status: DC | PRN

## 2022-09-29 MED ORDER — OXYCODONE-ACETAMINOPHEN 5-325 MG PO TABS: 1.0000 | ORAL_TABLET | ORAL | 0 refills | Status: DC | PRN

## 2022-09-29 NOTE — TOC Transition Note (Signed)
Transition of Care [facility]) - CM/SW Discharge Note   Patient Details  Name: Justin [name] MRN: [id number] Date of Birth: [birth year]  Transition of Care [facility]) CM/SW Contact:  Candie Chroman, LCSW Phone Number: 09/29/2022, 3:28 PM   Clinical Narrative: Patient has orders to discharge to [facility] today. RN will call report to [phone]. EMS transport has been arranged for 5:00. No further concerns. CSW signing off.    Final next level of care: Skilled Nursing Facility Barriers to Discharge: Barriers Resolved   Patient Goals and CMS Choice CMS Medicare.gov Compare Post Acute Care list provided to:: Patient Choice offered to / list presented to : Patient, Spouse  Discharge Placement PASRR number recieved: 09/23/22 PASRR number recieved: 09/23/22              Patient to be transferred to facility by: EMS Name of family member notified: Corky Sox Patient and family notified of of transfer: 09/29/22  Discharge Plan and Services Additional resources added to the After Visit Summary for                            [facility] Arranged: PT [facility] Agency: [facility] [place] Date [place]: 09/21/22 Time [place]: 1400 Representative spoke with at [place]: [place] [phone]  Social Determinants of Health ([place]) Interventions [place]: No Food Insecurity (09/21/2022)  Housing: Low Risk  (09/21/2022)  Transportation Needs: No Transportation Needs (09/21/2022)  Utilities: Not At Risk (09/21/2022)  Tobacco Use: Medium Risk (09/26/2022)     Readmission Risk Interventions     No data to display

## 2022-09-29 NOTE — Progress Notes (Signed)
Report called to [facility], Therapist, sports, at [employer].  Awaiting EMS transport.

## 2022-09-29 NOTE — Progress Notes (Signed)
Physical Therapy Treatment Patient Details Name: Justin [name] MRN: [id number] DOB: [birth year] Today's Date: 09/29/2022   History of Present Illness Pt is a 82 year old male admitted with a L3 compression fracture after fall, now s/p L3 kyphoplasty; PMH significant for hypertension, hyperlipidemia, COPD on 2 L oxygen, CAD, CABG, systolic CHF with EF of 72-09%, gout, GERD, prostate cancer, BPH, PAD, anemia, L2 compression fracture (S/p of kyphoplasty of L2), s/p of augmented compression fractures T7 through T12    PT Comments    Pt seen for PT tx with pt received in bed c/o dizziness & nausea. Pt found to be soiled with urine all the way down to his socks & pt unaware. Pt requires min assist for log rolling with use of hospital bed features & extra time. PT provided total assist for changing into clean gown & socks & donning LSO sitting EOB. Pt is able to complete STS with mod/max assist, step pivot with mod assist & progressed to taking a few steps forwards & backwards with RW. Pt requires encouragement to progress mobility, although pt does c/o lightheadedness/dizziness with standing but BP actually increases vs decreased with change in position. Educated pt on importance of OOB mobility & pt reports he's aware but does appear fatigued by being OOB. PT also educated pt on recommendation of SNF, not HHPT upon d/c & pt voiced understanding.  BP checked in RUE: Supine: 99/58 mmHg (MAP 71), HR 72 bpm Sitting: 112/75 mmHg (MAP 88), HR 72 bpm Sitting in recliner after transferring: 118/77 mmHg (MAP 90), HR 71 bpm Sitting in recliner at end of session after gait: 113/72 mmHg (MAP 86), HR 70 bpm Pt on supplemental O2 via nasal cannula throughout session.   Recommendations for follow up therapy are one component of a multi-disciplinary discharge planning process, led by the attending physician.  Recommendations may be updated based on patient status, additional functional criteria and insurance  authorization.  Follow Up Recommendations  Skilled nursing-short term rehab (<3 hours/day) Can patient physically be transported by private vehicle: No   Assistance Recommended at Discharge Frequent or constant Supervision/Assistance  Patient can return home with the following A lot of help with walking and/or transfers;A lot of help with bathing/dressing/bathroom;Assist for transportation;Assistance with cooking/housework;Help with stairs or ramp for entrance   Equipment Recommendations  Rolling walker (2 wheels)    Recommendations for Other Services       Precautions / Restrictions Precautions Precautions: Fall;Back Precaution Comments: HX OF VERTIGO. Required Braces or Orthoses: Spinal Brace Spinal Brace: Lumbar corset;Applied in sitting position Restrictions Weight Bearing Restrictions: No     Mobility  Bed Mobility Overal bed mobility: Needs Assistance Bed Mobility: Rolling, Sidelying to Sit Rolling: Min assist Sidelying to sit: Min assist, HOB elevated       General bed mobility comments: max cuing for technique & log rolling    Transfers Overall transfer level: Needs assistance Equipment used: Rolling walker (2 wheels) Transfers: Sit to/from Stand Sit to Stand: Mod assist, Max assist   Step pivot transfers: Mod assist       General transfer comment: extra time to turn RW    Ambulation/Gait Ambulation/Gait assistance: Min assist Gait Distance (Feet):  (3 ft forwards + 3 ft backwards) Assistive device: Rolling walker (2 wheels) Gait Pattern/deviations: Decreased step length - right, Decreased step length - left, Decreased dorsiflexion - left, Decreased dorsiflexion - right, Decreased stride length Gait velocity: decreased         Stairs  Wheelchair Mobility    Modified Rankin (Stroke Patients Only)       Balance Overall balance assessment: Needs assistance Sitting-balance support: Feet supported, Bilateral upper extremity  supported Sitting balance-Leahy Scale: Fair Sitting balance - Comments: pt with L lateral lean as pain worsens   Standing balance support: Bilateral upper extremity supported, Reliant on assistive device for balance, During functional activity Standing balance-Leahy Scale: Poor                              Cognition Arousal/Alertness: Awake/alert Behavior During Therapy: WFL for tasks assessed/performed Overall Cognitive Status: Within Functional Limits for tasks assessed Area of Impairment: Safety/judgement, Awareness, Problem solving                       Following Commands: Follows one step commands with increased time Safety/Judgement: Decreased awareness of deficits Awareness: Emergent Problem Solving: Slow processing, Requires verbal cues, Requires tactile cues General Comments: Pt soiled with urine but unaware of this.        Exercises      General Comments General comments (skin integrity, edema, etc.): Pt c/o dizziness but no nystagmus observed.      Pertinent Vitals/Pain Pain Assessment Pain Assessment: Faces Faces Pain Scale: Hurts even more Pain Location: back Pain Descriptors / Indicators: Discomfort, Grimacing Pain Intervention(s): Repositioned, Monitored during session    Home Living                          Prior Function            PT Goals (current goals can now be found in the care plan section) Acute Rehab PT Goals Patient Stated Goal: decreased pain, get better PT Goal Formulation: With patient/family Time For Goal Achievement: 10/11/22 Potential to Achieve Goals: Fair Additional Goals Additional Goal #1: pt will verbalize and dmeonstrate back precautions and wear brace 100% of th etime correctly Progress towards PT goals: Progressing toward goals    Frequency    7X/week      PT Plan Current plan remains appropriate    Co-evaluation              AM-PAC PT "6 Clicks" Mobility   Outcome  Measure  Help needed turning from your back to your side while in a flat bed without using bedrails?: A Little Help needed moving from lying on your back to sitting on the side of a flat bed without using bedrails?: A Lot Help needed moving to and from a bed to a chair (including a wheelchair)?: A Lot Help needed standing up from a chair using your arms (e.g., wheelchair or bedside chair)?: A Lot Help needed to walk in hospital room?: A Lot Help needed climbing 3-5 steps with a railing? : Total 6 Click Score: 12    End of Session Equipment Utilized During Treatment: Back brace;Oxygen Activity Tolerance: Patient limited by fatigue (limited by dizziness/lightheadedness with mobility) Patient left: in chair;with chair alarm set;with call bell/phone within reach   PT Visit Diagnosis: Unsteadiness on feet (R26.81);Muscle weakness (generalized) (M62.81);Difficulty in walking, not elsewhere classified (R26.2);Pain Pain - part of body:  (back)     Time: 5449-2010 PT Time Calculation (min) (ACUTE ONLY): 25 min  Charges:  $Therapeutic Activity: 23-37 mins                     Lavone Nian, PT, DPT  09/29/22, 1:28 PM   Waunita Schooner 09/29/2022, 1:24 PM

## 2022-09-29 NOTE — Care Management Important Message (Signed)
Important Message  Patient Details  Name: Justin [name] MRN: [id number] Date of Birth: October 11, 1940   Medicare Important Message Given:  Yes     Dannette Barbara 09/29/2022, 10:44 AM

## 2022-09-29 NOTE — Plan of Care (Signed)
  Problem: Clinical Measurements: Goal: Respiratory complications will improve Outcome: Progressing   Problem: Activity: Goal: Risk for activity intolerance will decrease Outcome: Progressing   Problem: Coping: Goal: Level of anxiety will decrease Outcome: Progressing   Problem: Pain Managment: Goal: General experience of comfort will improve Outcome: Progressing   Problem: Safety: Goal: Ability to remain free from injury will improve Outcome: Progressing   

## 2022-09-29 NOTE — TOC Progression Note (Addendum)
Transition of Care [facility]) - Progression Note    Patient Details  Name: Justin [name] MRN: [id number] Date of Birth: 05/26/1941  [facility]) CM/SW [place], LCSW Phone Number: 09/29/2022, 9:44 AM  Clinical Narrative: Daughter is sick so patient has decided to move forward with SNF. He has accepted bed offer from [facility]. Left message for admissions coordinator to notify.   10:06 am: Per MD, patient medically stable for discharge. [facility] admissions coordinator is aware. Asked her to order bipap. Discharge date will depend on bipap delivery.   2:07 pm: Bipap delivered to [facility]. MD is aware.  Expected Discharge Plan: [place] Barriers to Discharge: Continued Medical Work up  Expected Discharge Plan and [place] arrangements for the past 2 months: [place]: PT [place]: [facility] Date [place]: 09/21/22 Time [place]: 1400 Representative spoke with at [place]: [place] [phone]   Social Determinants of Health ([place]) Interventions [place]: No Food Insecurity (09/21/2022)  Housing: Low Risk  (09/21/2022)  Transportation Needs: No Transportation Needs (09/21/2022)  Utilities: Not At Risk (09/21/2022)  Tobacco Use: Medium Risk (09/26/2022)    Readmission Risk Interventions     No data to display

## 2022-09-29 NOTE — Discharge Summary (Addendum)
Justin [name] [facility]GH:[id number] DOB: [birth year] DOA: 09/20/2022  PCP: Rusty Aus, MD  Admit date: 09/20/2022 Discharge date: 09/29/2022  Time spent: 35 minutes  Recommendations for Outpatient Follow-up:  Pcp and orthopedics f/u BMP 1 week to assess potassium and sodium     Discharge Diagnoses:  Principal Problem:   Closed compression fracture of L3 vertebra [name]) Active Problems:   Right rib fracture   Fall at home, initial encounter   CAD (coronary artery disease)   HLD (hyperlipidemia)   Atrial fibrillation, chronic (HCC)   HTN (hypertension)   COPD (chronic obstructive pulmonary disease) (HCC)   Chronic systolic CHF (congestive heart failure) ([place])   Gout   Hypokalemia   Discharge Condition: stable  Diet recommendation: heart healthy  Filed Weights   09/22/22 0500 09/25/22 0406 09/28/22 0326  Weight: 66.7 kg 64.7 kg 69.1 kg    History of present illness:  From admission h and p  Justin [name] is a 82 y.o. male with medical history significant of hypertension, hyperlipidemia, COPD on 2 L oxygen, CAD, CABG, systolic CHF with EF of 67-89%, gout, GERD, prostate cancer, BPH, PAD, anemia, L2 compression fracture (S/p of kyphoplasty of L2), s/p of augmented compression fractures T7 through T12, who presents with fall and back pain.   The patient states that he lost his balance and fell into the corner between the wall and floor on 3 days ago. No LOC. He states he also hit his head but does not have headache or neck pain at this time. He developed pain in lower back, right rib cage,right shoulder. The pain he lower back is the worst, which is constant, severe, radiating to both legs bilaterally. Patient states that initially he was able to get up and walk around, but in the past 2 days, he has worsening pain, now is unable to get out of bed this morning due to severe pain.  Patient has a mild dry cough, mild shortness breath, no chest pain.  Patient is constipated, no nausea,  vomiting or abdominal pain.  No symptoms of UTI.  Hospital Course:  Patient presents with back pain radiating down right leg. Found to have acute compression fracture of L3. Evaluated by neurosurgy, no surgery indicated, LSO brace placed for comfort. Patient with a history of multiple compression fractures s/p several kyphoplasties which he says were very helpful and he requested another. IR was consulted and they performed L3 kyphoplasty on 1/5. Evaluated by PT and deemed eligible for SNF, discharged there. In terms of chronic problems, patient's a-fib was controlled, his BB and apixaban were continued. He has chronic hyponatremia and his sodium did drift down somewhat while hospitalized, likely 2/2 diuretic use, which has now been held. Patient will need repeat of sodium and potassium in about one week. His chronic respiratory failure (on 2-3 liters during the day) is stable here and he was maintained on bipap at night and [place] during the day. He did suffer from constipation while here but his resolved after escalation of bowel regimen and an enema.   Procedures: L3 kyphoplasty   Consultations: IR, neurosurgery  Discharge Exam: Vitals:   09/29/22 1027 09/29/22 1531  BP: 105/62 97/73  Pulse: 69 69  Resp:  20  Temp:  98 F (36.7 C)  SpO2: 100% 97%    General exam: Appears calm and comfortable  Respiratory system: Clear to auscultation. Respiratory effort normal. Cardiovascular system: Irregular. No JVD, murmurs, rubs, gallops or clicks.  Gastrointestinal system: Abdomen is nondistended,  soft and nontender. No organomegaly or masses felt.   Central nervous system: Alert and oriented. No focal neurological deficits. Moving all 4 Extremities: warm, no edema Skin: No rashes, lesions or ulcers Psychiatry: Judgement and insight appear normal. Mood & affect appropriate.   Discharge Instructions   Discharge Instructions     Diet - low sodium heart healthy   Complete by: As directed     Increase activity slowly   Complete by: As directed    No wound care   Complete by: As directed       Allergies as of 09/29/2022       Reactions   Hydrochlorothiazide W-triamterene Nausea Only        Medication List     STOP taking these medications    potassium chloride SA 20 MEQ tablet Commonly known as: KLOR-CON M   torsemide 20 MG tablet Commonly known as: DEMADEX   traMADol 50 MG tablet Commonly known as: ULTRAM       TAKE these medications    acetaminophen 325 MG tablet Commonly known as: TYLENOL Take 650-975 mg by mouth See admin instructions. Take 2 tablets ('650mg'$ ) by mouth daily before breakfast, 2 tablets ('650mg'$ ) by mouth at lunchtime and take 3 tablets ('975mg'$ ) by mouth every night   albuterol 108 (90 Base) MCG/ACT inhaler Commonly known as: VENTOLIN HFA Inhale 2 puffs into the lungs every 4 (four) hours as needed for wheezing or shortness of breath.   allopurinol 300 MG tablet Commonly known as: ZYLOPRIM Take 300 mg by mouth daily.   apixaban 5 MG Tabs tablet Commonly known as: ELIQUIS Take 5 mg by mouth 2 (two) times daily.   atorvastatin 80 MG tablet Commonly known as: LIPITOR Take 40 mg by mouth daily at 12 noon.   Benefiber Drink Mix Pack Take 1 packet by mouth daily with breakfast.   Calcium Carbonate-Vitamin D3 600-400 MG-UNIT Tabs Take 1 tablet by mouth in the morning and at bedtime.   cholecalciferol 25 MCG (1000 UNIT) tablet Commonly known as: VITAMIN D3 Take 3,000 Units by mouth daily at 12 noon.   cyanocobalamin 1000 MCG tablet Commonly known as: VITAMIN B12 Take 1,000 mcg by mouth daily at 12 noon.   Emergen-C Vitamin C Pack Take 1 packet by mouth daily.   ferrous gluconate 324 MG tablet Commonly known as: FERGON Take 324 mg by mouth daily with breakfast.   fexofenadine 180 MG tablet Commonly known as: ALLEGRA Take 180 mg by mouth daily before breakfast.   fluticasone 50 MCG/ACT nasal spray Commonly known as:  FLONASE Place 2 sprays into both nostrils at bedtime.   gabapentin 300 MG capsule Commonly known as: NEURONTIN Take 300 mg by mouth at bedtime.   hydrocortisone 25 MG suppository Commonly known as: ANUSOL-HC Place 25 mg rectally daily as needed for hemorrhoids or anal itching.   isosorbide mononitrate 60 MG 24 hr tablet Commonly known as: IMDUR Take 60 mg by mouth in the morning.   magnesium oxide 400 (240 Mg) MG tablet Commonly known as: MAG-OX Take 400 mg by mouth 2 (two) times daily.   methocarbamol 500 MG tablet Commonly known as: ROBAXIN Take 1 tablet (500 mg total) by mouth every 8 (eight) hours as needed for muscle spasms.   niacin 500 MG tablet Commonly known as: VITAMIN B3 Take 500 mg by mouth in the morning, at noon, and at bedtime.   nitroGLYCERIN 0.4 MG SL tablet Commonly known as: NITROSTAT Place 0.4 mg under the tongue every  5 (five) minutes x 3 doses as needed for chest pain.   omeprazole 40 MG capsule Commonly known as: PRILOSEC Take 40 mg by mouth 2 (two) times daily.   oxyCODONE-acetaminophen 5-325 MG tablet Commonly known as: PERCOCET/ROXICET Take 1 tablet by mouth every 4 (four) hours as needed for moderate pain.   polyethylene glycol 17 g packet Commonly known as: MIRALAX / GLYCOLAX Take 17 g by mouth daily as needed for moderate constipation. What changed: reasons to take this   PROBIOTIC ACIDOPHILUS PO Take 2 capsules by mouth daily at 12 noon.   propranolol 80 MG tablet Commonly known as: INDERAL Take 80 mg by mouth in the morning.   senna-docusate 8.6-50 MG tablet Commonly known as: Senokot-S Take 2 tablets by mouth 2 (two) times daily.   Tiotropium Bromide-Olodaterol 2.5-2.5 MCG/ACT Aers Inhale 2 puffs into the lungs in the morning.       Allergies  Allergen Reactions   Hydrochlorothiazide W-Triamterene Nausea Only    Contact information for follow-up providers     Rusty Aus, MD Follow up.   Specialty: Internal  Medicine Contact information: [facility] [place] [address] [phone]         Diamond Nickel, DO Follow up.   Specialty: Sports Medicine Contact information: [place] [address] [phone]              Contact information for after-discharge care     Destination     [facility] [place] [facility] Preferred SNF .   Service: Skilled Nursing Contact information: [address] [place] [phone]                      The results of significant diagnostics from this hospitalization (including imaging, microbiology, ancillary and laboratory) are listed below for reference.    Significant Diagnostic Studies: IR KYPHO LUMBAR INC FX REDUCE BONE BX UNI/BIL CANNULATION INC/IMAGING  Result Date: 09/26/2022 INDICATION: Acute to subacute L3 vertebral body compression fracture EXAM: L3 vertebral body augmentation using balloon kyphoplasty COMPARISON:  None Available. MEDICATIONS: Documented in the EMR ANESTHESIA/SEDATION: Moderate (conscious) sedation was employed during this procedure. A total of Versed 0.5 mg and Fentanyl 50 mcg was administered intravenously by the radiology nurse. Total intra-service moderate Sedation Time: 31 minutes. The patient's level of consciousness and vital signs were monitored continuously by radiology nursing throughout the procedure under my direct supervision. FLUOROSCOPY: Radiation Exposure Index (as provided by the fluoroscopic device): 7.2 minutes (656 mGy) COMPLICATIONS: None immediate. PROCEDURE: Informed written consent was obtained from the patient after a thorough discussion of the procedural risks, benefits and alternatives. All questions were addressed. Maximal Sterile Barrier Technique was utilized including caps, mask, sterile gowns, sterile gloves, sterile drape, hand hygiene and skin antiseptic. A timeout was performed prior to the initiation of  the procedure. The patient was positioned prone on the exam table. A bipedicular access was planned. Skin entry site(s) were marked overlying the L3 vertebral body using fluoroscopy. The overlying skin was then prepped and draped in the standard sterile fashion. Local analgesia was obtained with 1% lidocaine. Attention was first turned to the right side. Under fluoroscopic guidance, a 10 gauge introducer needle was advanced towards the lateral margin of the pedicle. Using multiple projections, the introducer needle was advanced towards the posterior margin of the vertebral body via a transpedicular approach. The inner needle was then removed, and the drill was advanced towards the anterior  margin of the vertebral body. Attention was then turned to the contralateral side. Under fluoroscopic guidance, a 10 gauge introducer needle was advanced towards the lateral margin of the pedicle. Using multiple projections, the introducer needle was advanced towards the posterior margin of the vertebral body via a transpedicular approach. The inner needle was then removed, and the drill was advanced towards the anterior margin of the vertebral body. The Kyphon 15 mm inflatable bone tamps were then advanced through the bilateral transpedicular access needles and positioned within the mid vertebral body. Kyphoplasty was then performed, ensuring that the balloon contours stayed within the vertebral body margins. The balloons were then deflated and removed, followed by advancement of the bone filler devices bilaterally and the instillation of acrylic bone cement with excellent filling in the AP and lateral projections. No extravasation was noted in the disk spaces or posteriorly into the spinal canal. No epidural venous contamination was seen. There was a small amount of cement noted along the left lateral border of the L3 vertebral body. At the end of the procedure, the introducer cannulas and bone filler devices were then removed  without difficulty. Clean dressings were placed after hemostasis. The patient tolerated all aspects of the procedure well, and was transferred to recovery in stable condition. IMPRESSION: 1. Successful L3 vertebral body augmentation using balloon kyphoplasty. If the patient has known osteoporosis, recommend treatment as clinically indicated. If the patient's bone density status is unknown, DEXA scan is recommended. Electronically Signed   By: Albin Felling M.D.   On: 09/26/2022 12:30   DG Knee 1-2 Views Right  Result Date: 09/21/2022 CLINICAL DATA:  Fall 4 days ago. Right knee pain. Abrasion to patellar area. EXAM: RIGHT KNEE - 1-2 VIEW COMPARISON:  None Available. FINDINGS: Mild medial compartment joint space narrowing. Minimal chronic enthesopathic change at the quadriceps insertion on the patella. Mild superior patellar degenerative osteophytosis. No joint effusion. Surgical clips overlie the posteromedial knee and proximal calf. No acute fracture or dislocation. IMPRESSION: Mild medial and patellofemoral compartment osteoarthritis. Electronically Signed   By: Yvonne Kendall M.D.   On: 09/21/2022 11:37   DG Tibia/Fibula Left  Result Date: 09/21/2022 CLINICAL DATA:  Fall 4 days ago.  Left lower leg pain. EXAM: LEFT TIBIA AND FIBULA - 2 VIEW COMPARISON:  None available FINDINGS: Mildly decreased bone mineralization. Mild-to-moderate chronic enthesopathic change at the quadriceps insertion on the patella. No knee joint effusion. Mild medial compartment of the knee joint space narrowing. The ankle mortise is symmetric and intact. Mild plantar calcaneal heel spur. Mild dorsal navicular-cuneiform mild-to-moderate tarsometatarsal degenerative osteophytes. No acute fracture or dislocation. Moderate vascular calcifications. IMPRESSION: Mild-to-moderate chronic enthesopathic change at the quadriceps insertion on the patella. Electronically Signed   By: Yvonne Kendall M.D.   On: 09/21/2022 11:35   CT HEAD WO  CONTRAST (5MM)  Result Date: 09/20/2022 CLINICAL DATA:  Head trauma fall EXAM: CT HEAD WITHOUT CONTRAST TECHNIQUE: Contiguous axial images were obtained from the base of the skull through the vertex without intravenous contrast. RADIATION DOSE REDUCTION: This exam was performed according to the departmental dose-optimization program which includes automated exposure control, adjustment of the mA and/or kV according to patient size and/or use of iterative reconstruction technique. COMPARISON:  CT brain 05/08/2022 FINDINGS: Brain: No acute territorial infarction, hemorrhage or intracranial mass. Mild atrophy. Mild white matter hypodensity consistent with chronic small vessel ischemic change. Stable nonenlarged ventricles. Vascular: No hyperdense vessels. Vertebral and carotid vascular calcification Skull: Normal. Negative for fracture or focal lesion.  Sinuses/Orbits: No acute finding. Other: None IMPRESSION: 1. No CT evidence for acute intracranial abnormality. 2. Atrophy and chronic small vessel ischemic changes of the white matter. Electronically Signed   By: Donavan Foil M.D.   On: 09/20/2022 15:57   CT Lumbar Spine Wo Contrast  Result Date: 09/20/2022 CLINICAL DATA:  82 year old male status post fall 3 days ago. Increasing back pain, unable to ambulate. EXAM: CT LUMBAR SPINE WITHOUT CONTRAST TECHNIQUE: Multidetector CT imaging of the lumbar spine was performed without intravenous contrast administration. Multiplanar CT image reconstructions were also generated. RADIATION DOSE REDUCTION: This exam was performed according to the departmental dose-optimization program which includes automated exposure control, adjustment of the mA and/or kV according to patient size and/or use of iterative reconstruction technique. COMPARISON:  Thoracic spine CT today. Lumbar spine CT 07/01/2022 and lumbar MRI 07/03/2022. FINDINGS: Segmentation: Normal, concordant with the thoracic spine numbering today. Alignment: Lumbar  lordosis not significantly changed since October. Vertebrae: Previously augmented lower thoracic compression fractures through L1 and L2. Diffuse osteopenia. New L3 inferior endplate compression fracture since October with up to 16% loss of vertebral body height. Subtle retropulsion of the posteroinferior endplate. L3 pedicles and posterior elements appear intact. L4, L5, visible sacrum appear stable and intact. Paraspinal and other soft tissues: Stable. Aortoiliac calcified atherosclerosis. Disc levels: Stable lumbar spine degeneration, and underlying capacious spinal canal. IMPRESSION: 1. Acute to subacute L3 inferior endplate compression fracture is new since October. Up to 16% loss of vertebral body height. Minimal retropulsion with no complicating features. 2. Interval augmentation of L2 compression fracture. Previous lower thoracic through L1 augmented compression fractures. Diffuse osteopenia. 3.  Aortic Atherosclerosis (ICD10-I70.0). Electronically Signed   By: Genevie Ann M.D.   On: 09/20/2022 11:50   CT Thoracic Spine Wo Contrast  Result Date: 09/20/2022 CLINICAL DATA:  82 year old male status post fall 3 days ago. Increasing back pain, unable to ambulate. EXAM: CT THORACIC SPINE WITHOUT CONTRAST TECHNIQUE: Multidetector CT images of the thoracic were obtained using the standard protocol without intravenous contrast. RADIATION DOSE REDUCTION: This exam was performed according to the departmental dose-optimization program which includes automated exposure control, adjustment of the mA and/or kV according to patient size and/or use of iterative reconstruction technique. COMPARISON:  Chronic lung disease. FINDINGS: Limited cervical spine imaging:  Reported separately today. Thoracic spine segmentation:  Normal on the CT comparison last year. Alignment: Stable thoracic kyphosis. No significant scoliosis or spondylolisthesis. Vertebrae: Diffuse osteopenia. Previously augmented thoracic compression fractures T7  through T12. T1 through T6 vertebrae remain intact. Partially visible previous sternotomy. Healing fracture of the right posterior 9th rib (series 3, image 105) is new from last year. Other chronic posterior rib fractures (left 7th rib) but no acute rib fracture identified. Paraspinal and other soft tissues: Prior CABG. Chronic elevation of the right hemidiaphragm with right greater than left pulmonary atelectasis. Underlying chronic subpleural lung disease with areas of fibrosis and paraseptal emphysema. Chronic moderate to large gastric hiatal hernia. Calcified aortic atherosclerosis. Stable noncontrast chest and visible upper abdomen from the chest CT last year. Thoracic paraspinal soft tissues remain within normal limits. Disc levels: Mild for age superimposed thoracic spine degeneration and capacious thoracic spinal canal. Chronic interbody ankylosis now at T11-T12. IMPRESSION: 1. No acute osseous abnormality identified in the Thoracic Spine. Osteopenia. Chronic and previously augmented compression fractures T7 through T12. 2. Healing fracture of the right posterior 9th rib is new from last year. No acute rib fracture identified. 3. Prior CABG. Chronic hiatal hernia. Aortic Atherosclerosis (  ICD10-I70.0). Chronic lung disease and Emphysema (ICD10-J43.9). Electronically Signed   By: Genevie Ann M.D.   On: 09/20/2022 11:45   CT Cervical Spine Wo Contrast  Result Date: 09/20/2022 CLINICAL DATA:  Neck trauma EXAM: CT CERVICAL SPINE WITHOUT CONTRAST TECHNIQUE: Multidetector CT imaging of the cervical spine was performed without intravenous contrast. Multiplanar CT image reconstructions were also generated. RADIATION DOSE REDUCTION: This exam was performed according to the departmental dose-optimization program which includes automated exposure control, adjustment of the mA and/or kV according to patient size and/or use of iterative reconstruction technique. COMPARISON:  05/08/2022 FINDINGS: Alignment: Facet joints  are aligned without dislocation or traumatic listhesis. Dens and lateral masses are aligned. Skull base and vertebrae: No acute fracture. No primary bone lesion or focal pathologic process. Soft tissues and spinal canal: No prevertebral fluid or swelling. No visible canal hematoma. Disc levels: Moderate multilevel cervical spondylosis, not significantly progressed from prior. Upper chest: Mild emphysematous changes at the included lung apices. Other: Aortic and bilateral carotid atherosclerosis. IMPRESSION: No acute fracture or traumatic listhesis of the cervical spine. Aortic Atherosclerosis (ICD10-I70.0). Electronically Signed   By: Davina Poke D.O.   On: 09/20/2022 11:36   DG Shoulder Right  Result Date: 09/20/2022 CLINICAL DATA:  Right shoulder pain EXAM: RIGHT SHOULDER - 2+ VIEW COMPARISON:  03/13/2021 FINDINGS: There is no evidence of fracture or dislocation. Mild osteoarthritis of the AC joint. No significant glenohumeral arthropathy on the included projections. Soft tissues are unremarkable. IMPRESSION: Mild osteoarthritis of the AC joint.  No acute findings. Electronically Signed   By: Davina Poke D.O.   On: 09/20/2022 10:54    Microbiology: No results found for this or any previous visit (from the past 240 hour(s)).   Labs: Basic Metabolic Panel: Recent Labs  Lab 09/24/22 0617 09/25/22 0709 09/26/22 0530 09/27/22 0458 09/28/22 0451 09/29/22 0409  NA 134* 132* 128* 130* 126* 123*  K 4.4 3.5 3.4* 3.6 3.3* 3.0*  CL 90* 86* 83* 85* 83* 80*  CO2 33* 35* 35* 35* 32 34*  GLUCOSE 100* 129* 92 103* 95 94  BUN 26* 31* 25* '22 15 13  '$ CREATININE 1.16 1.28* 1.06 0.98 0.79 0.69  CALCIUM 9.3 9.6 8.8* 9.1 9.1 8.6*  MG 2.6*  --   --   --   --   --    Liver Function Tests: No results for input(s): "AST", "ALT", "ALKPHOS", "BILITOT", "PROT", "ALBUMIN" in the last 168 hours. No results for input(s): "LIPASE", "AMYLASE" in the last 168 hours. No results for input(s): "AMMONIA" in the  last 168 hours. CBC: Recent Labs  Lab 09/26/22 0530 09/28/22 0451  WBC 7.5 11.5*  NEUTROABS 4.6  --   HGB 13.7 12.0*  HCT 41.9 35.9*  MCV 101.5* 97.6  PLT 158 164   Cardiac Enzymes: No results for input(s): "CKTOTAL", "CKMB", "CKMBINDEX", "TROPONINI" in the last 168 hours. BNP: BNP (last 3 results) Recent Labs    09/20/22 1028  BNP 207.3*    ProBNP (last 3 results) No results for input(s): "PROBNP" in the last 8760 hours.  CBG: No results for input(s): "GLUCAP" in the last 168 hours.     Signed:  Desma Maxim MD.  [facility] 09/29/2022, 4:16 PM

## 2022-10-16 ENCOUNTER — Ambulatory Visit: Payer: Medicare Other | Admitting: Dermatology

## 2022-12-18 ENCOUNTER — Ambulatory Visit (INDEPENDENT_AMBULATORY_CARE_PROVIDER_SITE_OTHER): Payer: Medicare Other | Admitting: Dermatology

## 2022-12-18 ENCOUNTER — Encounter: Payer: Self-pay | Admitting: Dermatology

## 2022-12-18 VITALS — BP 113/72 | HR 61

## 2022-12-18 DIAGNOSIS — L570 Actinic keratosis: Secondary | ICD-10-CM | POA: Diagnosis not present

## 2022-12-18 DIAGNOSIS — Z86007 Personal history of in-situ neoplasm of skin: Secondary | ICD-10-CM | POA: Diagnosis not present

## 2022-12-18 DIAGNOSIS — C4442 Squamous cell carcinoma of skin of scalp and neck: Secondary | ICD-10-CM

## 2022-12-18 DIAGNOSIS — D492 Neoplasm of unspecified behavior of bone, soft tissue, and skin: Secondary | ICD-10-CM

## 2022-12-18 NOTE — Progress Notes (Unsigned)
Follow-Up Visit   Subjective  Justin [name] is a 82 y.o. male who presents for the following: Spots. Neck and face  The patient has spots, moles and lesions to be evaluated, some may be new or changing and the patient has concerns that these could be cancer.  The following portions of the chart were reviewed this encounter and updated as appropriate: medications, allergies, medical history  Review of Systems:  No other skin or systemic complaints except as noted in HPI or Assessment and Plan.  Objective  Well appearing patient in no apparent distress; mood and affect are within normal limits.  Skin examined: face, ears and neck. Relevant physical exam findings are noted in the Assessment and Plan.  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  - Call if any new or changing lesions are noted between office visits   Neoplasm of skin Right Lateral Neck  Epidermal / dermal shaving  Lesion diameter (cm):  1 Informed consent: discussed and consent obtained   Patient was prepped and draped in usual sterile fashion: Area prepped with alcohol. Anesthesia: the lesion was anesthetized in a standard fashion   Anesthetic:  1% lidocaine w/ epinephrine 1-100,000 buffered w/ 8.4% NaHCO3 Instrument used: flexible razor blade   Hemostasis achieved with: pressure, aluminum chloride and electrodesiccation   Outcome: patient tolerated procedure well   Post-procedure details: wound care instructions given   Post-procedure details comment:  Ointment and small bandage applied  Destruction of lesion  Destruction method: electrodesiccation and curettage   Informed  consent: discussed and consent obtained   Timeout:  patient name, date of birth, surgical site, and procedure verified Anesthesia: the lesion was anesthetized in a standard fashion   Anesthetic:  1% lidocaine w/ epinephrine 1-100,000 buffered w/ 8.4% NaHCO3 Curettage performed in three different directions: Yes   Electrodesiccation performed over the curetted area: Yes   Curettage cycles:  3 Hemostasis achieved with:  electrodesiccation Outcome: patient tolerated procedure well with no complications   Post-procedure details: sterile dressing applied and wound care instructions given   Dressing type: petrolatum    Specimen 1 - Surgical pathology Differential Diagnosis: R/O SCC  Check Margins: No  EDC performed today     ACTINIC KERATOSIS Hypertrophic Exam: Erythematous hypertrophic papules with gritty scale  Actinic keratoses are precancerous spots that appear secondary to cumulative UV radiation exposure/sun exposure over time. They are chronic with expected duration over 1 year. A portion of actinic keratoses will progress to squamous cell carcinoma of the skin. It is not possible to reliably predict which spots will progress to skin cancer and so treatment is recommended to prevent development of skin cancer.  Recommend daily broad spectrum sunscreen SPF 30+ to sun-exposed areas, reapply every 2 hours as needed.  Recommend staying in the shade or wearing long sleeves, sun glasses (UVA+UVB protection) and wide brim hats (4-inch brim around the entire circumference of the hat). Call for new or changing lesions.  Treatment Plan: LN2  Prior to procedure, discussed risks of blister formation, small wound, skin dyspigmentation, or rare scar following cryotherapy. Recommend Vaseline ointment to treated areas while healing.  Destruction Procedure Note  Destruction method: cryotherapy   Informed consent: discussed and consent obtained   Lesion destroyed using liquid nitrogen: Yes    Outcome: patient tolerated procedure well with no complications   Post-procedure details: wound care instructions given   Locations: left preauricular x1 # of Lesions Treated: 1   Return in about 2 months (around 02/17/2023) for AK Follow Up, UBSE.  I, Emelia Salisbury, CMA, am acting as scribe for Forest Gleason, MD.   Documentation: I have reviewed the above documentation for accuracy and completeness, and I agree with the above.  Forest Gleason, MD

## 2022-12-18 NOTE — Patient Instructions (Signed)
Cryotherapy Aftercare  Wash gently with soap and water everyday.   Apply Vaseline and Band-Aid daily until healed.    Wound Care Instructions  Cleanse wound gently with soap and water once a day then pat dry with clean gauze. Apply a thin coat of Petrolatum (petroleum jelly, "Vaseline") over the wound (unless you have an allergy to this). We recommend that you use a new, sterile tube of Vaseline. Do not pick or remove scabs. Do not remove the yellow or white "healing tissue" from the base of the wound.  Cover the wound with fresh, clean, nonstick gauze and secure with paper tape. You may use Band-Aids in place of gauze and tape if the wound is small enough, but would recommend trimming much of the tape off as there is often too much. Sometimes Band-Aids can irritate the skin.  You should call the office for your biopsy report after 1 week if you have not already been contacted.  If you experience any problems, such as abnormal amounts of bleeding, swelling, significant bruising, significant pain, or evidence of infection, please call the office immediately.  FOR ADULT SURGERY PATIENTS: If you need something for pain relief you may take 1 extra strength Tylenol (acetaminophen) AND 2 Ibuprofen (200mg each) together every 4 hours as needed for pain. (do not take these if you are allergic to them or if you have a reason you should not take them.) Typically, you may only need pain medication for 1 to 3 days.     Due to recent changes in healthcare laws, you may see results of your pathology and/or laboratory studies on MyChart before the doctors have had a chance to review them. We understand that in some cases there may be results that are confusing or concerning to you. Please understand that not all results are received at the same time and often the doctors may need to interpret multiple results in order to provide you with the best plan of care or course of treatment. Therefore, we ask that you  please give us 2 business days to thoroughly review all your results before contacting the office for clarification. Should we see a critical lab result, you will be contacted sooner.   If You Need Anything After Your Visit  If you have any questions or concerns for your doctor, please call our main line at [phone] and press option 4 to reach your doctor's medical assistant. If no one answers, please leave a voicemail as directed and we will return your call as soon as possible. Messages left after 4 pm will be answered the following business day.   You may also send us a message via MyChart. We typically respond to MyChart messages within 1-2 business days.  For prescription refills, please ask your pharmacy to contact our office. Our fax number is [phone].  If you have an urgent issue when the clinic is closed that cannot wait until the next business day, you can page your doctor at the number below.    Please note that while we do our best to be available for urgent issues outside of office hours, we are not available 24/7.   If you have an urgent issue and are unable to reach us, you may choose to seek medical care at your doctor's office, retail clinic, urgent care center, or emergency room.  If you have a medical emergency, please immediately call 911 or go to the emergency department.  Pager Numbers  - Dr. Kowalski: [phone]  -   Dr. Moye: [phone]  - Dr. Stewart: [phone]  In the event of inclement weather, please call our main line at [phone] for an update on the status of any delays or closures.  Dermatology Medication Tips: Please keep the boxes that topical medications come in in order to help keep track of the instructions about where and how to use these. Pharmacies typically print the medication instructions only on the boxes and not directly on the medication tubes.   If your medication is too expensive, please contact our office at  [phone] option 4 or send us a message through MyChart.   We are unable to tell what your co-pay for medications will be in advance as this is different depending on your insurance coverage. However, we may be able to find a substitute medication at lower cost or fill out paperwork to get insurance to cover a needed medication.   If a prior authorization is required to get your medication covered by your insurance company, please allow us 1-2 business days to complete this process.  Drug prices often vary depending on where the prescription is filled and some pharmacies may offer cheaper prices.  The website www.goodrx.com contains coupons for medications through different pharmacies. The prices here do not account for what the cost may be with help from insurance (it may be cheaper with your insurance), but the website can give you the price if you did not use any insurance.  - You can print the associated coupon and take it with your prescription to the pharmacy.  - You may also stop by our office during regular business hours and pick up a GoodRx coupon card.  - If you need your prescription sent electronically to a different pharmacy, notify our office through [facility] MyChart or by phone at [phone] option 4.     Si Usted Necesita Algo Despus de Su Visita  Tambin puede enviarnos un mensaje a travs de MyChart. Por lo general respondemos a los mensajes de MyChart en el transcurso de 1 a 2 das hbiles.  Para renovar recetas, por favor pida a su farmacia que se ponga en contacto con nuestra oficina. Nuestro nmero de fax es el [phone].  Si tiene un asunto urgente cuando la clnica est cerrada y que no puede esperar hasta el siguiente da hbil, puede llamar/localizar a su doctor(a) al nmero que aparece a continuacin.   Por favor, tenga en cuenta que aunque hacemos todo lo posible para estar disponibles para asuntos urgentes fuera del horario de oficina, no estamos  disponibles las 24 horas del da, los 7 das de la semana.   Si tiene un problema urgente y no puede comunicarse con nosotros, puede optar por buscar atencin mdica  en el consultorio de su doctor(a), en una clnica privada, en un centro de atencin urgente o en una sala de emergencias.  Si tiene una emergencia mdica, por favor llame inmediatamente al 911 o vaya a la sala de emergencias.  Nmeros de bper  - Dr. Kowalski: [phone]  - Dra. Moye: [phone]  - Dra. Stewart: [phone]  En caso de inclemencias del tiempo, por favor llame a nuestra lnea principal al [phone] para una actualizacin sobre el estado de cualquier retraso o cierre.  Consejos para la medicacin en dermatologa: Por favor, guarde las cajas en las que vienen los medicamentos de uso tpico para ayudarle a seguir las instrucciones sobre dnde y cmo usarlos. Las farmacias generalmente imprimen las instrucciones del medicamento slo en las cajas y   no directamente en los tubos del medicamento.   Si su medicamento es muy caro, por favor, pngase en contacto con nuestra oficina llamando al [phone] y presione la opcin 4 o envenos un mensaje a travs de MyChart.   No podemos decirle cul ser su copago por los medicamentos por adelantado ya que esto es diferente dependiendo de la cobertura de su seguro. Sin embargo, es posible que podamos encontrar un medicamento sustituto a menor costo o llenar un formulario para que el seguro cubra el medicamento que se considera necesario.   Si se requiere una autorizacin previa para que su compaa de seguros cubra su medicamento, por favor permtanos de 1 a 2 das hbiles para completar este proceso.  Los precios de los medicamentos varan con frecuencia dependiendo del lugar de dnde se surte la receta y alguna farmacias pueden ofrecer precios ms baratos.  El sitio web www.goodrx.com tiene cupones para medicamentos de diferentes farmacias. Los precios aqu no  tienen en cuenta lo que podra costar con la ayuda del seguro (puede ser ms barato con su seguro), pero el sitio web puede darle el precio si no utiliz ningn seguro.  - Puede imprimir el cupn correspondiente y llevarlo con su receta a la farmacia.  - Tambin puede pasar por nuestra oficina durante el horario de atencin regular y recoger una tarjeta de cupones de GoodRx.  - Si necesita que su receta se enve electrnicamente a una farmacia diferente, informe a nuestra oficina a travs de MyChart de [place] o por telfono llamando al [phone] y presione la opcin 4.  

## 2022-12-25 ENCOUNTER — Telehealth: Payer: Self-pay

## 2022-12-25 NOTE — Telephone Encounter (Signed)
-----   Message from Ralene Bathe, MD sent at 12/24/2022  1:19 PM EDT ----- Diagnosis Skin , right lateral neck WELL DIFFERENTIATED SQUAMOUS CELL CARCINOMA  Cancer - SCC = well differentiated Already treated Recheck next visit

## 2022-12-25 NOTE — Telephone Encounter (Signed)
Called patient. N/A. LMOVM pathology results. Recheck at next visit

## 2023-02-18 ENCOUNTER — Ambulatory Visit (INDEPENDENT_AMBULATORY_CARE_PROVIDER_SITE_OTHER): Payer: Medicare Other | Admitting: Dermatology

## 2023-02-18 DIAGNOSIS — C44622 Squamous cell carcinoma of skin of right upper limb, including shoulder: Secondary | ICD-10-CM | POA: Diagnosis not present

## 2023-02-18 DIAGNOSIS — X32XXXA Exposure to sunlight, initial encounter: Secondary | ICD-10-CM

## 2023-02-18 DIAGNOSIS — D229 Melanocytic nevi, unspecified: Secondary | ICD-10-CM

## 2023-02-18 DIAGNOSIS — D1801 Hemangioma of skin and subcutaneous tissue: Secondary | ICD-10-CM

## 2023-02-18 DIAGNOSIS — L570 Actinic keratosis: Secondary | ICD-10-CM

## 2023-02-18 DIAGNOSIS — L578 Other skin changes due to chronic exposure to nonionizing radiation: Secondary | ICD-10-CM

## 2023-02-18 DIAGNOSIS — L821 Other seborrheic keratosis: Secondary | ICD-10-CM

## 2023-02-18 DIAGNOSIS — Z85828 Personal history of other malignant neoplasm of skin: Secondary | ICD-10-CM

## 2023-02-18 DIAGNOSIS — W908XXA Exposure to other nonionizing radiation, initial encounter: Secondary | ICD-10-CM

## 2023-02-18 DIAGNOSIS — Z1283 Encounter for screening for malignant neoplasm of skin: Secondary | ICD-10-CM | POA: Diagnosis not present

## 2023-02-18 DIAGNOSIS — L814 Other melanin hyperpigmentation: Secondary | ICD-10-CM

## 2023-02-18 DIAGNOSIS — D485 Neoplasm of uncertain behavior of skin: Secondary | ICD-10-CM

## 2023-02-18 NOTE — Progress Notes (Unsigned)
Follow-Up Visit   Subjective  Justin [name] is a 82 y.o. male who presents for the following: Skin Cancer Screening and Upper Body Skin Exam  The patient presents for Upper Body Skin Exam (UBSE) for skin cancer screening and mole check. The patient has spots, moles and lesions to be evaluated, some may be new or changing and the patient has concerns that these could be cancer.   The following portions of the chart were reviewed this encounter and updated as appropriate: medications, allergies, medical history  Review of Systems:  No other skin or systemic complaints except as noted in HPI or Assessment and Plan.  Objective  Well appearing patient in no apparent distress; mood and affect are within normal limits.  All skin waist up examined. Relevant physical exam findings are noted in the Assessment and Plan.  R ear helix x 1, R post ear x 1, R frontal scalp x 6, vertex scalp x 1, L frontal scalp x 2, R hand x 1, R forearm x 1 (13) Erythematous thin papules/macules with gritty scale.   R forearm 0.9 cm hyperkeratotic papule.        Assessment & Plan   AK (actinic keratosis) (13) R ear helix x 1, R post ear x 1, R frontal scalp x 6, vertex scalp x 1, L frontal scalp x 2, R hand x 1, R forearm x 1  Actinic keratoses are precancerous spots that appear secondary to cumulative UV radiation exposure/sun exposure over time. They are chronic with expected duration over 1 year. A portion of actinic keratoses will progress to squamous cell carcinoma of the skin. It is not possible to reliably predict which spots will progress to skin cancer and so treatment is recommended to prevent development of skin cancer.  Recommend daily broad spectrum sunscreen SPF 30+ to sun-exposed areas, reapply every 2 hours as needed.  Recommend staying in the shade or wearing long sleeves, sun glasses (UVA+UVB protection) and wide brim hats (4-inch brim around the entire circumference of the hat). Call  for new or changing lesions.  Hypertrophic at R frontal scalp  RTC if R frontal scalp lesions do not clear. Consider bx at follow up if not resolved. Thinner AK at forehead, defer tx today. Consider tx at follow up.   Prior to procedure, discussed risks of blister formation, small wound, skin dyspigmentation, or rare scar following cryotherapy. Recommend Vaseline ointment to treated areas while healing.   Destruction of lesion - R ear helix x 1, R post ear x 1, R frontal scalp x 6, vertex scalp x 1, L frontal scalp x 2, R hand x 1, R forearm x 1 Complexity: simple   Destruction method: cryotherapy   Informed consent: discussed and consent obtained   Timeout:  patient name, date of birth, surgical site, and procedure verified Lesion destroyed using liquid nitrogen: Yes   Region frozen until ice ball extended beyond lesion: Yes   Outcome: patient tolerated procedure well with no complications   Post-procedure details: wound care instructions given    Neoplasm of uncertain behavior of skin R forearm  Epidermal / dermal shaving  Lesion diameter (cm):  0.9 Informed consent: discussed and consent obtained   Timeout: patient name, date of birth, surgical site, and procedure verified   Procedure prep:  Patient was prepped and draped in usual sterile fashion Prep type:  Isopropyl alcohol Anesthesia: the lesion was anesthetized in a standard fashion   Anesthetic:  1% lidocaine w/ epinephrine 1-100,000  buffered w/ 8.4% NaHCO3 Instrument used: flexible razor blade   Hemostasis achieved with: pressure, aluminum chloride and electrodesiccation   Outcome: patient tolerated procedure well   Post-procedure details: sterile dressing applied and wound care instructions given   Dressing type: bandage and petrolatum    Destruction of lesion Complexity: extensive   Destruction method: electrodesiccation and curettage   Informed consent: discussed and consent obtained   Timeout:  patient name, date  of birth, surgical site, and procedure verified Procedure prep:  Patient was prepped and draped in usual sterile fashion Prep type:  Isopropyl alcohol Anesthesia: the lesion was anesthetized in a standard fashion   Anesthetic:  1% lidocaine w/ epinephrine 1-100,000 buffered w/ 8.4% NaHCO3 Curettage performed in three different directions: Yes   Electrodesiccation performed over the curetted area: Yes   Lesion length (cm):  0.9 Lesion width (cm):  0.9 Final wound size (cm):  1.3 Hemostasis achieved with:  pressure, aluminum chloride and electrodesiccation Outcome: patient tolerated procedure well with no complications   Post-procedure details: sterile dressing applied and wound care instructions given   Dressing type: bandage and petrolatum   Additional details:  1.3 cm post tx defect   Specimen 1 - Surgical pathology Differential Diagnosis: D48.5 r/o SCC  ED&C today  Check Margins: No   Lentigines, Seborrheic Keratoses, Hemangiomas - Benign normal skin lesions - Benign-appearing - Call for any changes  Melanocytic Nevi - Tan-brown and/or pink-flesh-colored symmetric macules and papules - Benign appearing on exam today - Observation - Call clinic for new or changing moles - Recommend daily use of broad spectrum spf 30+ sunscreen to sun-exposed areas.   Actinic Damage - Chronic condition, secondary to cumulative UV/sun exposure - diffuse scaly erythematous macules with underlying dyspigmentation - Recommend daily broad spectrum sunscreen SPF 30+ to sun-exposed areas, reapply every 2 hours as needed.  - Staying in the shade or wearing long sleeves, sun glasses (UVA+UVB protection) and wide brim hats (4-inch brim around the entire circumference of the hat) are also recommended for sun protection.  - Call for new or changing lesions.  HISTORY OF SQUAMOUS CELL CARCINOMA OF THE SKIN RIGHT LATERAL NECK - No evidence of recurrence today - No lymphadenopathy - Recommend regular full  body skin exams - Recommend daily broad spectrum sunscreen SPF 30+ to sun-exposed areas, reapply every 2 hours as needed.  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  - Call if any new or changing lesions are noted between office visits   Skin cancer screening performed today.  Return in about 2 months (around 04/27/2023) for 10 weeks AK and SCC follow up; 6 months FBSE/Upper body Skin Exam.  I, Cari Caraway, CMA, am acting as scribe for Darden Dates, MD .  Documentation: I have reviewed the above documentation for accuracy and completeness, and I agree with the above.  Darden Dates, MD

## 2023-02-18 NOTE — Patient Instructions (Signed)
Electrodesiccation and Curettage ("Scrape and Burn") Wound Care Instructions  Leave the original bandage on for 24 hours if possible.  If the bandage becomes soaked or soiled before that time, it is OK to remove it and examine the wound.  A small amount of post-operative bleeding is normal.  If excessive bleeding occurs, remove the bandage, place gauze over the site and apply continuous pressure (no peeking) over the area for 30 minutes. If this does not work, please call our clinic as soon as possible or page your doctor if it is after hours.   Once a day, cleanse the wound with soap and water. It is fine to shower. If a thick crust develops you may use a Q-tip dipped into dilute hydrogen peroxide (mix 1:1 with water) to dissolve it.  Hydrogen peroxide can slow the healing process, so use it only as needed.    After washing, apply petroleum jelly (Vaseline) or an antibiotic ointment if your doctor prescribed one for you, followed by a bandage.    For best healing, the wound should be covered with a layer of ointment at all times. If you are not able to keep the area covered with a bandage to hold the ointment in place, this may mean re-applying the ointment several times a day.  Continue this wound care until the wound has healed and is no longer open. It may take several weeks for the wound to heal and close.  Itching and mild discomfort is normal during the healing process.  If you have any discomfort, you can take Tylenol (acetaminophen) or ibuprofen as directed on the bottle. (Please do not take these if you have an allergy to them or cannot take them for another reason).  Some redness, tenderness and white or yellow material in the wound is normal healing.  If the area becomes very sore and red, or develops a thick yellow-green material (pus), it may be infected; please notify us.    Wound healing continues for up to one year following surgery. It is not unusual to experience pain in the scar  from time to time during the interval.  If the pain becomes severe or the scar thickens, you should notify the office.    A slight amount of redness in a scar is expected for the first six months.  After six months, the redness will fade and the scar will soften and fade.  The color difference becomes less noticeable with time.  If there are any problems, return for a post-op surgery check at your earliest convenience.  To improve the appearance of the scar, you can use silicone scar gel, cream, or sheets (such as Mederma or Serica) every night for up to one year. These are available over the counter (without a prescription).  Please call our office at [phone] for any questions or concerns.     Due to recent changes in healthcare laws, you may see results of your pathology and/or laboratory studies on MyChart before the doctors have had a chance to review them. We understand that in some cases there may be results that are confusing or concerning to you. Please understand that not all results are received at the same time and often the doctors may need to interpret multiple results in order to provide you with the best plan of care or course of treatment. Therefore, we ask that you please give us 2 business days to thoroughly review all your results before contacting the office for clarification. Should   we see a critical lab result, you will be contacted sooner.   If You Need Anything After Your Visit  If you have any questions or concerns for your doctor, please call our main line at [phone] and press option 4 to reach your doctor's medical assistant. If no one answers, please leave a voicemail as directed and we will return your call as soon as possible. Messages left after 4 pm will be answered the following business day.   You may also send us a message via MyChart. We typically respond to MyChart messages within 1-2 business days.  For prescription refills, please ask your  pharmacy to contact our office. Our fax number is [phone].  If you have an urgent issue when the clinic is closed that cannot wait until the next business day, you can page your doctor at the number below.    Please note that while we do our best to be available for urgent issues outside of office hours, we are not available 24/7.   If you have an urgent issue and are unable to reach us, you may choose to seek medical care at your doctor's office, retail clinic, urgent care center, or emergency room.  If you have a medical emergency, please immediately call 911 or go to the emergency department.  Pager Numbers  - Dr. Kowalski: [phone]  - Dr. Moye: [phone]  - Dr. Stewart: [phone]  In the event of inclement weather, please call our main line at [phone] for an update on the status of any delays or closures.  Dermatology Medication Tips: Please keep the boxes that topical medications come in in order to help keep track of the instructions about where and how to use these. Pharmacies typically print the medication instructions only on the boxes and not directly on the medication tubes.   If your medication is too expensive, please contact our office at [phone] option 4 or send us a message through MyChart.   We are unable to tell what your co-pay for medications will be in advance as this is different depending on your insurance coverage. However, we may be able to find a substitute medication at lower cost or fill out paperwork to get insurance to cover a needed medication.   If a prior authorization is required to get your medication covered by your insurance company, please allow us 1-2 business days to complete this process.  Drug prices often vary depending on where the prescription is filled and some pharmacies may offer cheaper prices.  The website www.goodrx.com contains coupons for medications through different pharmacies. The prices here do not  account for what the cost may be with help from insurance (it may be cheaper with your insurance), but the website can give you the price if you did not use any insurance.  - You can print the associated coupon and take it with your prescription to the pharmacy.  - You may also stop by our office during regular business hours and pick up a GoodRx coupon card.  - If you need your prescription sent electronically to a different pharmacy, notify our office through [facility] MyChart or by phone at [phone] option 4.     Si Usted Necesita Algo Despus de Su Visita  Tambin puede enviarnos un mensaje a travs de MyChart. Por lo general respondemos a los mensajes de MyChart en el transcurso de 1 a 2 das hbiles.  Para renovar recetas, por favor pida a su farmacia que se ponga en   contacto con nuestra oficina. Nuestro nmero de fax es el [phone].  Si tiene un asunto urgente cuando la clnica est cerrada y que no puede esperar hasta el siguiente da hbil, puede llamar/localizar a su doctor(a) al nmero que aparece a continuacin.   Por favor, tenga en cuenta que aunque hacemos todo lo posible para estar disponibles para asuntos urgentes fuera del horario de oficina, no estamos disponibles las 24 horas del da, los 7 das de la semana.   Si tiene un problema urgente y no puede comunicarse con nosotros, puede optar por buscar atencin mdica  en el consultorio de su doctor(a), en una clnica privada, en un centro de atencin urgente o en una sala de emergencias.  Si tiene una emergencia mdica, por favor llame inmediatamente al 911 o vaya a la sala de emergencias.  Nmeros de bper  - Dr. Kowalski: [phone]  - Dra. Moye: [phone]  - Dra. Stewart: [phone]  En caso de inclemencias del tiempo, por favor llame a nuestra lnea principal al [phone] para una actualizacin sobre el estado de cualquier retraso o cierre.  Consejos para la medicacin en dermatologa: Por  favor, guarde las cajas en las que vienen los medicamentos de uso tpico para ayudarle a seguir las instrucciones sobre dnde y cmo usarlos. Las farmacias generalmente imprimen las instrucciones del medicamento slo en las cajas y no directamente en los tubos del medicamento.   Si su medicamento es muy caro, por favor, pngase en contacto con nuestra oficina llamando al [phone] y presione la opcin 4 o envenos un mensaje a travs de MyChart.   No podemos decirle cul ser su copago por los medicamentos por adelantado ya que esto es diferente dependiendo de la cobertura de su seguro. Sin embargo, es posible que podamos encontrar un medicamento sustituto a menor costo o llenar un formulario para que el seguro cubra el medicamento que se considera necesario.   Si se requiere una autorizacin previa para que su compaa de seguros cubra su medicamento, por favor permtanos de 1 a 2 das hbiles para completar este proceso.  Los precios de los medicamentos varan con frecuencia dependiendo del lugar de dnde se surte la receta y alguna farmacias pueden ofrecer precios ms baratos.  El sitio web www.goodrx.com tiene cupones para medicamentos de diferentes farmacias. Los precios aqu no tienen en cuenta lo que podra costar con la ayuda del seguro (puede ser ms barato con su seguro), pero el sitio web puede darle el precio si no utiliz ningn seguro.  - Puede imprimir el cupn correspondiente y llevarlo con su receta a la farmacia.  - Tambin puede pasar por nuestra oficina durante el horario de atencin regular y recoger una tarjeta de cupones de GoodRx.  - Si necesita que su receta se enve electrnicamente a una farmacia diferente, informe a nuestra oficina a travs de MyChart de [facility] o por telfono llamando al [phone] y presione la opcin 4.  

## 2023-02-24 ENCOUNTER — Telehealth: Payer: Self-pay

## 2023-02-24 NOTE — Telephone Encounter (Signed)
Advised patient of results/hd  

## 2023-02-24 NOTE — Telephone Encounter (Signed)
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 Thank you!

## 2023-04-29 ENCOUNTER — Ambulatory Visit (INDEPENDENT_AMBULATORY_CARE_PROVIDER_SITE_OTHER): Payer: Medicare Other | Admitting: Dermatology

## 2023-04-29 ENCOUNTER — Encounter: Payer: Self-pay | Admitting: Dermatology

## 2023-04-29 VITALS — BP 126/71 | HR 74

## 2023-04-29 DIAGNOSIS — W908XXD Exposure to other nonionizing radiation, subsequent encounter: Secondary | ICD-10-CM | POA: Diagnosis not present

## 2023-04-29 DIAGNOSIS — L820 Inflamed seborrheic keratosis: Secondary | ICD-10-CM | POA: Diagnosis not present

## 2023-04-29 DIAGNOSIS — C4442 Squamous cell carcinoma of skin of scalp and neck: Secondary | ICD-10-CM | POA: Diagnosis not present

## 2023-04-29 DIAGNOSIS — L578 Other skin changes due to chronic exposure to nonionizing radiation: Secondary | ICD-10-CM

## 2023-04-29 DIAGNOSIS — C44622 Squamous cell carcinoma of skin of right upper limb, including shoulder: Secondary | ICD-10-CM

## 2023-04-29 DIAGNOSIS — D492 Neoplasm of unspecified behavior of bone, soft tissue, and skin: Secondary | ICD-10-CM

## 2023-04-29 DIAGNOSIS — C4492 Squamous cell carcinoma of skin, unspecified: Secondary | ICD-10-CM

## 2023-04-29 MED ORDER — FLUOROURACIL CHEMO INJECTION 500 MG/10ML FOR DERMATOLOGY USE
25.0000 mg | Freq: Once | INTRAVENOUS | Status: AC
  Administered 2023-04-29: 25 mg via INTRALESIONAL

## 2023-04-29 NOTE — Progress Notes (Signed)
Follow-Up Visit   Subjective  Justin [name] is a 82 y.o. male who presents for the following: 2 month follow up. Actinic keratoses. Tx with LN2 02/18/2023. Ears, scalp, right hand/arm. Recheck right frontal scalp, hypertrophic. C/O very tender spot at left frontal scalp.  2 month recheck of SCC at right forearm. Tx with [facility] 02/18/2023. Patient states it took a long time to heal but is no longer painful.  The patient has spots, moles and lesions to be evaluated, some may be new or changing and the patient may have concern these could be cancer.   The following portions of the chart were reviewed this encounter and updated as appropriate: medications, allergies, medical history  Review of Systems:  No other skin or systemic complaints except as noted in HPI or Assessment and Plan.  Objective  Well appearing patient in no apparent distress; mood and affect are within normal limits.  A focused examination was performed of the following areas: Scalp, face, chest, hands  Relevant exam findings are noted in the Assessment and Plan.  Right Forearm - Posterior 7 mm pink scaly papule at lateral edge of scar       Left Frontal Scalp 8 mm erythematous scaly papule       Right Frontal Scalp 9 mm hyperkeratotic papule       Mid chest x1 Erythematous keratotic or waxy stuck-on papule or plaque.       Assessment & Plan     ACTINIC DAMAGE - chronic, secondary to cumulative UV radiation exposure/sun exposure over time - diffuse scaly erythematous macules with underlying dyspigmentation - Recommend daily broad spectrum sunscreen SPF 30+ to sun-exposed areas, reapply every 2 hours as needed.  - Recommend staying in the shade or wearing long sleeves, sun glasses (UVA+UVB protection) and wide brim hats (4-inch brim around the entire circumference of the hat). - Call for new or changing lesions.   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 0.5 ml injected into lesion Informed consent: discussed and consent obtained   Timeout:  patient name, date of birth, surgical site, and procedure verified Patient was prepped and draped in usual sterile fashion: area prepped with alcohol. Anesthesia: the lesion was anesthetized in a standard fashion   Anesthetic:  1% lidocaine w/ epinephrine 1-100,000 local infiltration Hemostasis achieved with:  pressure, aluminum chloride and electrodesiccation Outcome: patient tolerated procedure well with no complications   Post-procedure details: wound care instructions given   Post-procedure details comment:  Ointment and small bandage applied  Bx proven well-differentiated SCC on 02/18/2023 s/p ED&C with clinically-apparent recurrence on lateral edge of scar.   After obtaining verbal consent, the site was anesthetized using 1 % lidocaine with epinephrine. Clinically apparent tumor was debulked with a dermablade. The site was treated IL5FU as described below. Hemostasis was achieved using drysol. Area was cleaned, dressed and post-procedure wound care instructions were reviewed.  Fluorouracil 50 mg/ml 0.5 ml injected into lesion on right forearm. Lot: [id number] Exp: 07/2023 NDC: 14782-956-21  Related Medications fluorouracil (ADRUCIL) chemo injection 25 mg   Neoplasm of skin (2) Left Frontal Scalp  Skin / nail biopsy Type of biopsy: tangential   Informed consent: discussed and consent obtained   Timeout: patient name, date of birth, surgical site, and procedure verified   Procedure prep:  Patient was prepped and draped in usual sterile fashion Prep type:  Isopropyl alcohol Anesthesia: the lesion was anesthetized in a standard fashion   Anesthetic:  1% lidocaine w/ epinephrine 1-100,000 buffered w/ 8.4% NaHCO3 Instrument used: DermaBlade   Hemostasis achieved with: pressure, aluminum  chloride and electrodesiccation   Outcome: patient tolerated procedure well   Post-procedure details: sterile dressing applied and wound care instructions given   Dressing type: bandage and petrolatum    Specimen 1 - Surgical pathology Differential Diagnosis: R/O SCC  Check Margins: No  Right Frontal Scalp  Skin / nail biopsy Type of biopsy: tangential   Informed consent: discussed and consent obtained   Timeout: patient name, date of birth, surgical site, and procedure verified   Procedure prep:  Patient was prepped and draped in usual sterile fashion Prep type:  Isopropyl alcohol Anesthesia: the lesion was anesthetized in a standard fashion   Anesthetic:  1% lidocaine w/ epinephrine 1-100,000 buffered w/ 8.4% NaHCO3 Instrument used: DermaBlade   Hemostasis achieved with: pressure, aluminum chloride and electrodesiccation   Outcome: patient tolerated procedure well   Post-procedure details: sterile dressing applied and wound care instructions given   Dressing type: bandage and petrolatum    Specimen 2 - Surgical pathology Differential Diagnosis: R/O SCC  Check Margins: No  2 pieces of specimen in bottle  Inflamed seborrheic keratosis Mid chest x1  Symptomatic, irritating, patient would like treated.   Advised may need additional treatment due to thickness of area.   Destruction of lesion - Mid chest x1  Destruction method: cryotherapy   Informed consent: discussed and consent obtained   Lesion destroyed using liquid nitrogen: Yes   Region frozen until ice ball extended beyond lesion: Yes   Outcome: patient tolerated procedure well with no complications   Post-procedure details: wound care instructions given   Additional details:  Prior to procedure, discussed risks of blister formation, small wound, skin dyspigmentation, or rare scar following cryotherapy. Recommend Vaseline ointment to treated areas while healing.     Return for [facility]BSE As Scheduled.  I, Lawson Radar, CMA, am acting as scribe for Elie Goody, MD.   Documentation: I have reviewed the above documentation for accuracy and completeness, and I agree with the above.  Elie Goody, MD

## 2023-04-29 NOTE — Patient Instructions (Addendum)
Cryotherapy Aftercare  Wash gently with soap and water everyday.   Apply Vaseline Jelly daily until healed.    Wound Care Instructions  Cleanse wound gently with soap and water once a day then pat dry with clean gauze. Apply a thin coat of Petrolatum (petroleum jelly, "Vaseline") over the wound (unless you have an allergy to this). We recommend that you use a new, sterile tube of Vaseline. Do not pick or remove scabs. Do not remove the yellow or white "healing tissue" from the base of the wound.  Cover the wound with fresh, clean, nonstick gauze and secure with paper tape. You may use Band-Aids in place of gauze and tape if the wound is small enough, but would recommend trimming much of the tape off as there is often too much. Sometimes Band-Aids can irritate the skin.  You should call the office for your biopsy report after 1 week if you have not already been contacted.  If you experience any problems, such as abnormal amounts of bleeding, swelling, significant bruising, significant pain, or evidence of infection, please call the office immediately.  FOR ADULT SURGERY PATIENTS: If you need something for pain relief you may take 1 extra strength Tylenol (acetaminophen) AND 2 Ibuprofen (200mg  each) together every 4 hours as needed for pain. (do not take these if you are allergic to them or if you have a reason you should not take them.) Typically, you may only need pain medication for 1 to 3 days.    Due to recent changes in healthcare laws, you may see results of your pathology and/or laboratory studies on MyChart before the doctors have had a chance to review them. We understand that in some cases there may be results that are confusing or concerning to you. Please understand that not all results are received at the same time and often the doctors may need to interpret multiple results in order to provide you with the best plan of care or course of treatment. Therefore, we ask that you please  give [place] 2 business days to thoroughly review all your results before contacting the office for clarification. Should we see a critical lab result, you will be contacted sooner.   If You Need Anything After Your Visit  If you have any questions or concerns for your doctor, please call our main line at [phone] and press option 4 to reach your doctor's medical assistant. If no one answers, please leave a voicemail as directed and we will return your call as soon as possible. Messages left after 4 pm will be answered the following business day.   You may also send [place] a message via MyChart. We typically respond to MyChart messages within 1-2 business days.  For prescription refills, please ask your pharmacy to contact our office. Our fax number is [phone].  If you have an urgent issue when the clinic is closed that cannot wait until the next business day, you can page your doctor at the number below.    Please note that while we do our best to be available for urgent issues outside of office hours, we are not available 24/7.   If you have an urgent issue and are unable to reach [place], you may choose to seek medical care at your doctor's office, retail clinic, urgent care center, or emergency room.  If you have a medical emergency, please immediately call 911 or go to the emergency department.  Pager Numbers  - Dr. Gwen Pounds: [phone]  - Dr.  Roseanne Reno: [phone]  In the event of inclement weather, please call our main line at [phone] for an update on the status of any delays or closures.  Dermatology Medication Tips: Please keep the boxes that topical medications come in in order to help keep track of the instructions about where and how to use these. Pharmacies typically print the medication instructions only on the boxes and not directly on the medication tubes.   If your medication is too expensive, please contact our office at [phone] option 4 or send [place] a message  through MyChart.   We are unable to tell what your co-pay for medications will be in advance as this is different depending on your insurance coverage. However, we may be able to find a substitute medication at lower cost or fill out paperwork to get insurance to cover a needed medication.   If a prior authorization is required to get your medication covered by your insurance company, please allow [place] 1-2 business days to complete this process.  Drug prices often vary depending on where the prescription is filled and some pharmacies may offer cheaper prices.  The website www.goodrx.com contains coupons for medications through different pharmacies. The prices here do not account for what the cost may be with help from insurance (it may be cheaper with your insurance), but the website can give you the price if you did not use any insurance.  - You can print the associated coupon and take it with your prescription to the pharmacy.  - You may also stop by our office during regular business hours and pick up a GoodRx coupon card.  - If you need your prescription sent electronically to a different pharmacy, notify our office through [facility] or by phone at [phone] option 4.     Si Usted Necesita Algo Despus de Su Visita  Tambin puede enviarnos un mensaje a travs de Clinical cytogeneticist. Por lo general respondemos a los mensajes de MyChart en el transcurso de 1 a 2 das hbiles.  Para renovar recetas, por favor pida a su farmacia que se ponga en contacto con nuestra oficina. Annie Sable de fax es [place] [phone].  Si tiene un asunto urgente cuando la clnica est cerrada y que no puede esperar hasta el siguiente da hbil, puede llamar/localizar a su doctor(a) al nmero que aparece a continuacin.   Por favor, tenga en cuenta que aunque hacemos todo lo posible para estar disponibles para asuntos urgentes fuera del horario de [place], no estamos disponibles las 24 horas del da, los 7 [address]  [address] de  la [place].   Si tiene un problema urgente y no puede comunicarse con nosotros, puede optar por buscar atencin mdica  en el consultorio de su doctor(a), en una clnica privada, en un centro de atencin urgente o en una sala de emergencias.  Si tiene Engineer, drilling, por favor llame inmediatamente al 911 o vaya a la sala de emergencias.  Nmeros de bper  - Dr. Gwen Pounds: [phone]  - Dra. Roseanne Reno: [phone]  En caso de inclemencias del [place], por favor llame a Lacy Duverney principal al [phone] para una actualizacin sobre el [place] de cualquier retraso o cierre.  Consejos para la medicacin en dermatologa: Por favor, guarde las cajas en las que vienen los medicamentos de uso tpico para ayudarle a seguir las instrucciones sobre dnde y cmo usarlos. Las farmacias generalmente imprimen las instrucciones del medicamento slo en las cajas y no directamente en los tubos del [place].   Si su  medicamento es [place] caro, por favor, pngase en contacto con Rolm Gala llamando al [phone] y presione la opcin 4 o envenos un mensaje a travs de Clinical cytogeneticist.   No podemos decirle cul ser su copago por los medicamentos por adelantado ya que esto es diferente dependiendo de la cobertura de su seguro. Sin embargo, es posible que podamos encontrar un medicamento sustituto a Audiological scientist un formulario para que el seguro cubra el medicamento que se considera necesario.   Si se requiere una autorizacin previa para que su compaa de seguros [place] su medicamento, por favor permtanos de 1 a 2 das hbiles para completar [address]  Los precios de los medicamentos varan con frecuencia dependiendo del Environmental consultant de dnde se surte la receta y alguna farmacias pueden ofrecer precios ms baratos.  El sitio web www.goodrx.com tiene cupones para medicamentos de Health and safety inspector. Los precios aqu no tienen en cuenta lo que podra costar con la ayuda del seguro (puede ser ms  barato con su seguro), pero el sitio web puede darle el precio si no utiliz Tourist information centre manager.  - Puede imprimir el cupn correspondiente y llevarlo con su receta a la farmacia.  - Tambin puede pasar por nuestra oficina durante el horario de atencin regular y Education officer, museum una tarjeta de cupones de GoodRx.  - Si necesita que su receta se enve electrnicamente a una farmacia diferente, informe a nuestra oficina a travs de MyChart de [place] o por telfono llamando al [phone] y presione la opcin 4.

## 2023-05-01 ENCOUNTER — Other Ambulatory Visit: Payer: Self-pay | Admitting: Specialist

## 2023-05-01 DIAGNOSIS — R0609 Other forms of dyspnea: Secondary | ICD-10-CM

## 2023-05-01 DIAGNOSIS — J986 Disorders of diaphragm: Secondary | ICD-10-CM

## 2023-05-04 ENCOUNTER — Ambulatory Visit
Admission: RE | Admit: 2023-05-04 | Discharge: 2023-05-04 | Disposition: A | Discharge status: Home / Self Care | Payer: Medicare Other | Source: Ambulatory Visit | Attending: Specialist | Admitting: Specialist

## 2023-05-04 DIAGNOSIS — R0609 Other forms of dyspnea: Secondary | ICD-10-CM | POA: Insufficient documentation

## 2023-05-04 DIAGNOSIS — J986 Disorders of diaphragm: Secondary | ICD-10-CM | POA: Insufficient documentation

## 2023-05-06 ENCOUNTER — Telehealth: Payer: Self-pay

## 2023-05-06 DIAGNOSIS — C4492 Squamous cell carcinoma of skin, unspecified: Secondary | ICD-10-CM

## 2023-05-06 NOTE — Telephone Encounter (Signed)
-----   Message from [facility] sent at 05/05/2023  9:50 PM EDT ----- Diagnosis: 1. 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 Skin , right frontal scalp WELL DIFFERENTIATED SQUAMOUS CELL CARCINOMA  Please call with diagnosis and determine where the patient would like to have Mohs surgery for BOTH sites.  Explanation: This is a squamous cell skin cancer that has grown beyond the surface of the skin and is invading the second layer of the skin. It has the potential to spread beyond the skin and threaten your health, so I recommend treating it.  Treatment: Given the location and type of skin cancer, I recommend Mohs surgery. Mohs surgery involves cutting out the skin cancer and then checking under the microscope to ensure the whole skin cancer was removed. If any skin cancer remains, the surgeon will cut out more until it is fully removed. The cure rate is about 98-99%. Once the Mohs surgeon confirms the skin cancer is out, they will discuss the options to repair or heal the area. You must take it easy for about two weeks after surgery (no lifting over 10-15 lbs, avoid activity to get your heart rate and blood pressure up). It is done at another office outside of [place] ([place], [place], or [place]).  If the patient asks for my recommendation for Mohs surgeon, I recommend Dr Coralie Carpen or Dr Caprice Beaver at [facility] if the patient is able to make the trip.

## 2023-05-06 NOTE — Telephone Encounter (Signed)
Called patient. Discussed pathology results and Mohs Tx. Patient voiced understanding. Patient prefers to go to [facility] with Dr. Jeannine Boga again. Referral sent.

## 2023-07-07 ENCOUNTER — Telehealth: Payer: Self-pay

## 2023-07-07 NOTE — Telephone Encounter (Signed)
Updated history and specimen tracking with MOHs progress notes from L frontal scalp/ aw

## 2023-07-30 ENCOUNTER — Encounter: Payer: Self-pay | Admitting: Dermatology

## 2023-07-30 ENCOUNTER — Ambulatory Visit: Payer: Medicare Other | Admitting: Dermatology

## 2023-07-30 DIAGNOSIS — W908XXA Exposure to other nonionizing radiation, initial encounter: Secondary | ICD-10-CM | POA: Diagnosis not present

## 2023-07-30 DIAGNOSIS — Z86007 Personal history of in-situ neoplasm of skin: Secondary | ICD-10-CM

## 2023-07-30 DIAGNOSIS — L814 Other melanin hyperpigmentation: Secondary | ICD-10-CM

## 2023-07-30 DIAGNOSIS — D229 Melanocytic nevi, unspecified: Secondary | ICD-10-CM

## 2023-07-30 DIAGNOSIS — Z85828 Personal history of other malignant neoplasm of skin: Secondary | ICD-10-CM

## 2023-07-30 DIAGNOSIS — C4442 Squamous cell carcinoma of skin of scalp and neck: Secondary | ICD-10-CM | POA: Diagnosis not present

## 2023-07-30 DIAGNOSIS — D492 Neoplasm of unspecified behavior of bone, soft tissue, and skin: Secondary | ICD-10-CM | POA: Diagnosis not present

## 2023-07-30 DIAGNOSIS — C4492 Squamous cell carcinoma of skin, unspecified: Secondary | ICD-10-CM

## 2023-07-30 DIAGNOSIS — D489 Neoplasm of uncertain behavior, unspecified: Secondary | ICD-10-CM

## 2023-07-30 DIAGNOSIS — Z872 Personal history of diseases of the skin and subcutaneous tissue: Secondary | ICD-10-CM

## 2023-07-30 DIAGNOSIS — L570 Actinic keratosis: Secondary | ICD-10-CM | POA: Diagnosis not present

## 2023-07-30 DIAGNOSIS — Z1283 Encounter for screening for malignant neoplasm of skin: Secondary | ICD-10-CM

## 2023-07-30 DIAGNOSIS — L821 Other seborrheic keratosis: Secondary | ICD-10-CM

## 2023-07-30 DIAGNOSIS — L820 Inflamed seborrheic keratosis: Secondary | ICD-10-CM

## 2023-07-30 DIAGNOSIS — D1801 Hemangioma of skin and subcutaneous tissue: Secondary | ICD-10-CM

## 2023-07-30 DIAGNOSIS — L578 Other skin changes due to chronic exposure to nonionizing radiation: Secondary | ICD-10-CM

## 2023-07-30 HISTORY — DX: Squamous cell carcinoma of skin, unspecified: C44.92

## 2023-07-30 NOTE — Patient Instructions (Addendum)
Actinic keratoses are precancerous spots that appear secondary to cumulative UV radiation exposure/sun exposure over time. They are chronic with expected duration over 1 year. A portion of actinic keratoses will progress to squamous cell carcinoma of the skin. It is not possible to reliably predict which spots will progress to skin cancer and so treatment is recommended to prevent development of skin cancer.  Recommend daily broad spectrum sunscreen SPF 30+ to sun-exposed areas, reapply every 2 hours as needed.  Recommend staying in the shade or wearing long sleeves, sun glasses (UVA+UVB protection) and wide brim hats (4-inch brim around the entire circumference of the hat). Call for new or changing lesions.   Cryotherapy Aftercare  Wash gently with soap and water everyday.   Apply Vaseline and Band-Aid daily until healed.    Biopsy Wound Care Instructions  Leave the original bandage on for 24 hours if possible.  If the bandage becomes soaked or soiled before that time, it is OK to remove it and examine the wound.  A small amount of post-operative bleeding is normal.  If excessive bleeding occurs, remove the bandage, place gauze over the site and apply continuous pressure (no peeking) over the area for 30 minutes. If this does not work, please call our clinic as soon as possible or page your doctor if it is after hours.   Once a day, cleanse the wound with soap and water. It is fine to shower. If a thick crust develops you may use a Q-tip dipped into dilute hydrogen peroxide (mix 1:1 with water) to dissolve it.  Hydrogen peroxide can slow the healing process, so use it only as needed.    After washing, apply petroleum jelly (Vaseline) or an antibiotic ointment if your doctor prescribed one for you, followed by a bandage.    For best healing, the wound should be covered with a layer of ointment at all times. If you are not able to keep the area covered with a bandage to hold the ointment in place,  this may mean re-applying the ointment several times a day.  Continue this wound care until the wound has healed and is no longer open.   Itching and mild discomfort is normal during the healing process. However, if you develop pain or severe itching, please call our office.   If you have any discomfort, you can take Tylenol (acetaminophen) or ibuprofen as directed on the bottle. (Please do not take these if you have an allergy to them or cannot take them for another reason).  Some redness, tenderness and white or yellow material in the wound is normal healing.  If the area becomes very sore and red, or develops a thick yellow-green material (pus), it may be infected; please notify us.    If you have stitches, return to clinic as directed to have the stitches removed. You will continue wound care for 2-3 days after the stitches are removed.   Wound healing continues for up to one year following surgery. It is not unusual to experience pain in the scar from time to time during the interval.  If the pain becomes severe or the scar thickens, you should notify the office.    A slight amount of redness in a scar is expected for the first six months.  After six months, the redness will fade and the scar will soften and fade.  The color difference becomes less noticeable with time.  If there are any problems, return for a post-op surgery check at  your earliest convenience.  To improve the appearance of the scar, you can use silicone scar gel, cream, or sheets (such as Mederma or Serica) every night for up to one year. These are available over the counter (without a prescription).  Please call our office at [phone] for any questions or concerns.    Melanoma ABCDEs  Melanoma is the most dangerous type of skin cancer, and is the leading cause of death from skin disease.  You are more likely to develop melanoma if you: Have light-colored skin, light-colored eyes, or red or blond hair Spend a lot  of time in the sun Tan regularly, either outdoors or in a tanning bed Have had blistering sunburns, especially during childhood Have a close family member who has had a melanoma Have atypical moles or large birthmarks  Early detection of melanoma is key since treatment is typically straightforward and cure rates are extremely high if we catch it early.   The first sign of melanoma is often a change in a mole or a new dark spot.  The ABCDE system is a way of remembering the signs of melanoma.  A for asymmetry:  The two halves do not match. B for border:  The edges of the growth are irregular. C for color:  A mixture of colors are present instead of an even brown color. D for diameter:  Melanomas are usually (but not always) greater than 6mm - the size of a pencil eraser. E for evolution:  The spot keeps changing in size, shape, and color.  Please check your skin once per month between visits. You can use a small mirror in front and a large mirror behind you to keep an eye on the back side or your body.   If you see any new or changing lesions before your next follow-up, please call to schedule a visit.  Please continue daily skin protection including broad spectrum sunscreen SPF 30+ to sun-exposed areas, reapplying every 2 hours as needed when you're outdoors.   Staying in the shade or wearing long sleeves, sun glasses (UVA+UVB protection) and wide brim hats (4-inch brim around the entire circumference of the hat) are also recommended for sun protection.     Due to recent changes in healthcare laws, you may see results of your pathology and/or laboratory studies on MyChart before the doctors have had a chance to review them. We understand that in some cases there may be results that are confusing or concerning to you. Please understand that not all results are received at the same time and often the doctors may need to interpret multiple results in order to provide you with the best plan of  care or course of treatment. Therefore, we ask that you please give [place] 2 business days to thoroughly review all your results before contacting the office for clarification. Should we see a critical lab result, you will be contacted sooner.   If You Need Anything After Your Visit  If you have any questions or concerns for your doctor, please call our main line at [phone] and press option 4 to reach your doctor's medical assistant. If no one answers, please leave a voicemail as directed and we will return your call as soon as possible. Messages left after 4 pm will be answered the following business day.   You may also send [place] a message via MyChart. We typically respond to MyChart messages within 1-2 business days.  For prescription refills, please ask your pharmacy to contact  our office. Our fax number is [phone].  If you have an urgent issue when the clinic is closed that cannot wait until the next business day, you can page your doctor at the number below.    Please note that while we do our best to be available for urgent issues outside of office hours, we are not available 24/7.   If you have an urgent issue and are unable to reach [place], you may choose to seek medical care at your doctor's office, retail clinic, urgent care center, or emergency room.  If you have a medical emergency, please immediately call 911 or go to the emergency department.  Pager Numbers  - Dr. Gwen Pounds: [phone]  - Dr. Roseanne Reno: [phone]  - Dr. Katrinka Blazing: [phone]   In the event of inclement weather, please call our main line at [phone] for an update on the status of any delays or closures.  Dermatology Medication Tips: Please keep the boxes that topical medications come in in order to help keep track of the instructions about where and how to use these. Pharmacies typically print the medication instructions only on the boxes and not directly on the medication tubes.   If your medication  is too expensive, please contact our office at [phone] option 4 or send [place] a message through MyChart.   We are unable to tell what your co-pay for medications will be in advance as this is different depending on your insurance coverage. However, we may be able to find a substitute medication at lower cost or fill out paperwork to get insurance to cover a needed medication.   If a prior authorization is required to get your medication covered by your insurance company, please allow [place] 1-2 business days to complete this process.  Drug prices often vary depending on where the prescription is filled and some pharmacies may offer cheaper prices.  The website www.goodrx.com contains coupons for medications through different pharmacies. The prices here do not account for what the cost may be with help from insurance (it may be cheaper with your insurance), but the website can give you the price if you did not use any insurance.  - You can print the associated coupon and take it with your prescription to the pharmacy.  - You may also stop by our office during regular business hours and pick up a GoodRx coupon card.  - If you need your prescription sent electronically to a different pharmacy, notify our office through [facility] or by phone at [phone] option 4.     Si Usted Necesita Algo Despus de Su Visita  Tambin puede enviarnos un mensaje a travs de Clinical cytogeneticist. Por lo general respondemos a los mensajes de MyChart en el transcurso de 1 a 2 das hbiles.  Para renovar recetas, por favor pida a su farmacia que se ponga en contacto con nuestra oficina. Annie Sable de fax es [place] [phone].  Si tiene un asunto urgente cuando la clnica est cerrada y que no puede esperar hasta el siguiente da hbil, puede llamar/localizar a su doctor(a) al nmero que aparece a continuacin.   Por favor, tenga en cuenta que aunque hacemos todo lo posible para estar disponibles para asuntos  urgentes fuera del horario de [place], no estamos disponibles las 24 horas del da, los 7 [address]  [address] de la [place].   Si tiene un problema urgente y no puede comunicarse con nosotros, puede optar por buscar atencin mdica  en el consultorio de su doctor(a), en una clnica  privada, en un centro de atencin urgente o en una sala de emergencias.  Si tiene Engineer, drilling, por favor llame inmediatamente al 911 o vaya a la sala de emergencias.  Nmeros de bper  - Dr. Gwen Pounds: [phone]  - Dra. Roseanne Reno: [phone]  - Dr. Katrinka Blazing: [phone]   En caso de inclemencias del tiempo, por favor llame a Lacy Duverney principal al [phone] para una actualizacin sobre el [place] de cualquier retraso o cierre.  Consejos para la medicacin en dermatologa: Por favor, guarde las cajas en las que vienen los medicamentos de uso tpico para ayudarle a seguir las instrucciones sobre dnde y cmo usarlos. Las farmacias generalmente imprimen las instrucciones del medicamento slo en las cajas y no directamente en los tubos del [place].   Si su medicamento es muy caro, por favor, pngase en contacto con Rolm Gala llamando al [phone] y presione la opcin 4 o envenos un mensaje a travs de Clinical cytogeneticist.   No podemos decirle cul ser su copago por los medicamentos por adelantado ya que esto es diferente dependiendo de la cobertura de su seguro. Sin embargo, es posible que podamos encontrar un medicamento sustituto a Audiological scientist un formulario para que el seguro cubra el medicamento que se considera necesario.   Si se requiere una autorizacin previa para que su compaa de seguros [place] su medicamento, por favor permtanos de 1 a 2 das hbiles para completar [address]  Los precios de los medicamentos varan con frecuencia dependiendo del Environmental consultant de dnde se surte la receta y alguna farmacias pueden ofrecer precios ms baratos.  El sitio web www.goodrx.com tiene cupones para  medicamentos de Health and safety inspector. Los precios aqu no tienen en cuenta lo que podra costar con la ayuda del seguro (puede ser ms barato con su seguro), pero el sitio web puede darle el precio si no utiliz Tourist information centre manager.  - Puede imprimir el cupn correspondiente y llevarlo con su receta a la farmacia.  - Tambin puede pasar por nuestra oficina durante el horario de atencin regular y Education officer, museum una tarjeta de cupones de GoodRx.  - Si necesita que su receta se enve electrnicamente a una farmacia diferente, informe a nuestra oficina a travs de MyChart de [place] o por telfono llamando al [phone] y presione la opcin 4.

## 2023-07-30 NOTE — Progress Notes (Signed)
Follow-Up Visit   Subjective  Justin [name] is a 82 y.o. male who presents for the following: Skin Cancer Screening and Full Body Skin Exam 6 month follow up Hx of scc, hx of sccis, hx of aks, hx of isks  Spot at right side scalp that states is sore, he may pick at but he is not sure. Noticed a few months ago.   Patient states that he will go back on 20th to Mohs surgeon to have some precancers treated at area near surgery site.   The patient presents for Total-Body Skin Exam (TBSE) for skin cancer screening and mole check. The patient has spots, moles and lesions to be evaluated, some may be new or changing and the patient may have concern these could be cancer.    The following portions of the chart were reviewed this encounter and updated as appropriate: medications, allergies, medical history  Review of Systems:  No other skin or systemic complaints except as noted in HPI or Assessment and Plan.  Objective  Well appearing patient in no apparent distress; mood and affect are within normal limits.  A full examination was performed including scalp, head, eyes, ears, nose, lips, neck, chest, axillae, abdomen, back, buttocks, bilateral upper extremities, bilateral lower extremities, hands, feet, fingers, toes, fingernails, and toenails. All findings within normal limits unless otherwise noted below.   Relevant physical exam findings are noted in the Assessment and Plan.  left lateral cheek x 1, left upper cheek x 1, left lateral brow x 1, right paranasal x 1, right nasal bridge x 1, right preauricular x 1, left frontal scalp x 2, vertex scalp x 7 (15) Erythematous thin papules/macules with gritty scale.   right pariatal scalp 7 mm pink crusted papule            Assessment & Plan   SKIN CANCER SCREENING PERFORMED TODAY.  ACTINIC DAMAGE - Chronic condition, secondary to cumulative UV/sun exposure - diffuse scaly erythematous macules with underlying dyspigmentation -  Recommend daily broad spectrum sunscreen SPF 30+ to sun-exposed areas, reapply every 2 hours as needed.  - Staying in the shade or wearing long sleeves, sun glasses (UVA+UVB protection) and wide brim hats (4-inch brim around the entire circumference of the hat) are also recommended for sun protection.  - Call for new or changing lesions.  LENTIGINES, SEBORRHEIC KERATOSES, HEMANGIOMAS - Benign normal skin lesions - Benign-appearing - Call for any changes  MELANOCYTIC NEVI - Tan-brown and/or pink-flesh-colored symmetric macules and papules - Benign appearing on exam today - Observation - Call clinic for new or changing moles - Recommend daily use of broad spectrum spf 30+ sunscreen to sun-exposed areas.   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 They are chronic with expected duration over 1 year. A portion of  actinic keratoses will progress to squamous cell carcinoma of the skin. It is not possible to reliably predict which spots will progress to skin cancer and so treatment is recommended to prevent development of skin cancer.  Recommend daily broad spectrum sunscreen SPF 30+ to sun-exposed areas, reapply every 2 hours as needed.  Recommend staying in the shade or wearing long sleeves, sun glasses (UVA+UVB protection) and wide brim hats (4-inch brim around the entire circumference of the hat). Call for new or changing lesions.  Destruction of lesion - left lateral cheek x 1, left upper cheek x 1, left lateral brow x 1, right paranasal x 1, right nasal bridge x 1, right preauricular x 1, left frontal scalp x 2, vertex scalp x 7 (15) Complexity: simple   Destruction method: cryotherapy   Informed consent: discussed and consent obtained   Timeout:  patient name, date of birth, surgical site, and procedure verified Lesion destroyed using liquid nitrogen: Yes   Region frozen until ice ball extended beyond lesion: Yes   Cryo cycles: 1 or 2. 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  Skin / nail biopsy Type of biopsy: tangential   Informed consent: discussed and consent obtained   Timeout: patient name, date of birth, surgical site, and procedure verified   Procedure prep:  Patient was prepped and draped in usual sterile fashion Prep type:  Isopropyl alcohol Anesthesia: the lesion was anesthetized in a standard fashion   Anesthetic:  1% lidocaine w/ epinephrine 1-100,000 buffered w/ 8.4% NaHCO3 Instrument used: DermaBlade   Hemostasis achieved with: pressure and aluminum chloride   Outcome: patient tolerated procedure well   Post-procedure details: sterile dressing applied and wound care instructions given   Dressing type: bandage and petrolatum    Specimen 1 -  Surgical pathology Differential Diagnosis: r/o scc   Check Margins: No  R/o scc   Lentigines  Multiple benign nevi  Seborrheic keratoses  Actinic elastosis  Inflamed seborrheic keratosis  Cherry angioma   Return in about 6 months (around 01/27/2024) for tbse hx of scc.  I, Asher Muir, CMA, am acting as scribe for Elie Goody, MD.   Documentation: I have reviewed the above documentation for accuracy and completeness, and I agree with the above.  Elie Goody, MD

## 2023-08-04 LAB — SURGICAL PATHOLOGY

## 2023-08-05 ENCOUNTER — Telehealth: Payer: Self-pay

## 2023-08-05 DIAGNOSIS — C4492 Squamous cell carcinoma of skin, unspecified: Secondary | ICD-10-CM

## 2023-08-05 NOTE — Telephone Encounter (Signed)
Patient advised pathology results showed SCC. [facility]. Butch Penny., RMA

## 2023-08-05 NOTE — Telephone Encounter (Signed)
-----   Message from [place] sent at 08/04/2023  5:50 PM EST ----- Diagnosis: right parietal scalp :       SQUAMOUS CELL CARCINOMA, KERATOACANTHOMA TYPE, DEEP MARGIN INVOLVED    Please call with diagnosis and determine where the patient would like to have Mohs surgery.  Explanation: This is a squamous cell skin cancer that has grown beyond the surface of the skin and is invading the second layer of the skin. It has the potential to spread beyond the skin and threaten your health, so I recommend treating it.  Treatment: Given the location and type of skin cancer, I recommend Mohs surgery. Mohs surgery involves cutting out the skin cancer and then checking under the microscope to ensure the whole skin cancer was removed. If any skin cancer remains, the surgeon will cut out more until it is fully removed. The cure rate is about 98-99%. Once the Mohs surgeon confirms the skin cancer is out, they will discuss the options to repair or heal the area. You must take it easy for about two weeks after surgery (no lifting over 10-15 lbs, avoid activity to get your heart rate and blood pressure up). It is done at another office outside of [place] ([place], [place], or [place]).  If the patient asks for my recommendation for Mohs surgeon, I recommend Dr Caprice Beaver or Dr Coralie Carpen at [facility] if the patient is able to make the trip.

## 2023-08-25 ENCOUNTER — Other Ambulatory Visit
Admission: RE | Admit: 2023-08-25 | Discharge: 2023-08-25 | Disposition: A | Discharge status: Home / Self Care | Payer: Medicare Other | Source: Ambulatory Visit | Attending: Specialist | Admitting: Specialist

## 2023-08-25 DIAGNOSIS — R0789 Other chest pain: Secondary | ICD-10-CM | POA: Insufficient documentation

## 2023-08-25 DIAGNOSIS — R0602 Shortness of breath: Secondary | ICD-10-CM | POA: Insufficient documentation

## 2023-08-25 LAB — D-DIMER, QUANTITATIVE: D-Dimer, Quant: 0.31 ug{FEU}/mL (ref 0.00–0.50)

## 2023-09-10 ENCOUNTER — Telehealth: Payer: Self-pay

## 2023-09-10 NOTE — Telephone Encounter (Signed)
Specimen tracking and history updated from [facility] progress notes of right frontal scalp. aw

## 2023-09-14 ENCOUNTER — Telehealth: Payer: Self-pay

## 2023-09-14 NOTE — Telephone Encounter (Signed)
The [facility] progress notes in media. aw

## 2023-10-12 ENCOUNTER — Telehealth: Payer: Self-pay

## 2023-10-12 NOTE — Telephone Encounter (Signed)
Specimen tracking and history updated from [facility] progress notes of right parietal scalp. aw

## 2023-11-23 ENCOUNTER — Other Ambulatory Visit: Payer: Self-pay | Admitting: Family Medicine

## 2023-11-23 ENCOUNTER — Ambulatory Visit
Admission: RE | Admit: 2023-11-23 | Discharge: 2023-11-23 | Disposition: A | Discharge status: Home / Self Care | Source: Ambulatory Visit | Attending: Family Medicine | Admitting: Family Medicine

## 2023-11-23 DIAGNOSIS — Z8781 Personal history of (healed) traumatic fracture: Secondary | ICD-10-CM | POA: Diagnosis present

## 2023-11-25 ENCOUNTER — Other Ambulatory Visit: Payer: Self-pay | Admitting: Family Medicine

## 2023-11-25 DIAGNOSIS — S32050A Wedge compression fracture of fifth lumbar vertebra, initial encounter for closed fracture: Secondary | ICD-10-CM

## 2023-11-25 DIAGNOSIS — Z8781 Personal history of (healed) traumatic fracture: Secondary | ICD-10-CM

## 2023-11-26 ENCOUNTER — Ambulatory Visit
Admission: RE | Admit: 2023-11-26 | Discharge: 2023-11-26 | Disposition: A | Discharge status: Home / Self Care | Source: Ambulatory Visit | Attending: Family Medicine | Admitting: Family Medicine

## 2023-11-26 VITALS — BP 122/52 | HR 46 | Resp 14

## 2023-11-26 DIAGNOSIS — I482 Chronic atrial fibrillation, unspecified: Secondary | ICD-10-CM

## 2023-11-26 DIAGNOSIS — S32050A Wedge compression fracture of fifth lumbar vertebra, initial encounter for closed fracture: Secondary | ICD-10-CM

## 2023-11-26 DIAGNOSIS — Z8781 Personal history of (healed) traumatic fracture: Secondary | ICD-10-CM

## 2023-11-26 DIAGNOSIS — I5022 Chronic systolic (congestive) heart failure: Secondary | ICD-10-CM

## 2023-11-26 HISTORY — PX: IR RADIOLOGIST EVAL & MGMT: IMG5224
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
## 2023-11-27 ENCOUNTER — Encounter: Payer: Self-pay | Admitting: Radiology

## 2023-11-27 ENCOUNTER — Other Ambulatory Visit (HOSPITAL_COMMUNITY): Payer: Self-pay | Admitting: Radiology

## 2023-11-27 ENCOUNTER — Other Ambulatory Visit: Payer: Self-pay | Admitting: Interventional Radiology

## 2023-11-27 DIAGNOSIS — S32050A Wedge compression fracture of fifth lumbar vertebra, initial encounter for closed fracture: Secondary | ICD-10-CM

## 2023-11-27 DIAGNOSIS — M8008XA Age-related osteoporosis with current pathological fracture, vertebra(e), initial encounter for fracture: Secondary | ICD-10-CM

## 2023-11-27 DIAGNOSIS — Z8781 Personal history of (healed) traumatic fracture: Secondary | ICD-10-CM
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
## 2023-11-27 NOTE — Progress Notes (Signed)
 Patient for IR L5 Kypho Lumbar compression fracture on Monday 11/30/23, I called and spoke with the patient on the phone and gave pre-procedure instructions. Pt was made aware to be here at 12p, last dose of Eliquis is Friday 11/27/23, NPO after MN prior to procedure as well as driver post procedure/recovery/discharge. Pt stated understanding.  Called 11/27/23

## 2023-11-30 ENCOUNTER — Other Ambulatory Visit (HOSPITAL_COMMUNITY): Payer: Self-pay | Admitting: Radiology

## 2023-11-30 ENCOUNTER — Encounter: Payer: Self-pay | Admitting: Radiology

## 2023-11-30 ENCOUNTER — Ambulatory Visit
Admission: RE | Admit: 2023-11-30 | Discharge: 2023-11-30 | Disposition: A | Discharge status: Home / Self Care | Source: Ambulatory Visit | Attending: Interventional Radiology | Admitting: Interventional Radiology

## 2023-11-30 DIAGNOSIS — Z85828 Personal history of other malignant neoplasm of skin: Secondary | ICD-10-CM | POA: Diagnosis not present

## 2023-11-30 DIAGNOSIS — N189 Chronic kidney disease, unspecified: Secondary | ICD-10-CM | POA: Diagnosis not present

## 2023-11-30 DIAGNOSIS — Z8781 Personal history of (healed) traumatic fracture: Secondary | ICD-10-CM | POA: Diagnosis not present

## 2023-11-30 DIAGNOSIS — K219 Gastro-esophageal reflux disease without esophagitis: Secondary | ICD-10-CM | POA: Diagnosis not present

## 2023-11-30 DIAGNOSIS — Z79899 Other long term (current) drug therapy: Secondary | ICD-10-CM | POA: Diagnosis not present

## 2023-11-30 DIAGNOSIS — S32050A Wedge compression fracture of fifth lumbar vertebra, initial encounter for closed fracture: Secondary | ICD-10-CM

## 2023-11-30 DIAGNOSIS — Z9981 Dependence on supplemental oxygen: Secondary | ICD-10-CM | POA: Insufficient documentation

## 2023-11-30 DIAGNOSIS — M8008XA Age-related osteoporosis with current pathological fracture, vertebra(e), initial encounter for fracture: Secondary | ICD-10-CM | POA: Insufficient documentation

## 2023-11-30 DIAGNOSIS — Z7901 Long term (current) use of anticoagulants: Secondary | ICD-10-CM | POA: Diagnosis not present

## 2023-11-30 DIAGNOSIS — J449 Chronic obstructive pulmonary disease, unspecified: Secondary | ICD-10-CM | POA: Insufficient documentation

## 2023-11-30 DIAGNOSIS — I2510 Atherosclerotic heart disease of native coronary artery without angina pectoris: Secondary | ICD-10-CM | POA: Insufficient documentation

## 2023-11-30 DIAGNOSIS — Z87310 Personal history of (healed) osteoporosis fracture: Secondary | ICD-10-CM | POA: Insufficient documentation

## 2023-11-30 DIAGNOSIS — I129 Hypertensive chronic kidney disease with stage 1 through stage 4 chronic kidney disease, or unspecified chronic kidney disease: Secondary | ICD-10-CM | POA: Diagnosis not present

## 2023-11-30 DIAGNOSIS — Z951 Presence of aortocoronary bypass graft: Secondary | ICD-10-CM | POA: Insufficient documentation

## 2023-11-30 DIAGNOSIS — Z8546 Personal history of malignant neoplasm of prostate: Secondary | ICD-10-CM | POA: Diagnosis not present

## 2023-11-30 DIAGNOSIS — E785 Hyperlipidemia, unspecified: Secondary | ICD-10-CM | POA: Insufficient documentation

## 2023-11-30 HISTORY — PX: IR KYPHO LUMBAR INC FX REDUCE BONE BX UNI/BIL CANNULATION INC/IMAGING: IMG5519

## 2023-11-30 LAB — CBC WITH DIFFERENTIAL/PLATELET
Abs Immature Granulocytes: 0.03 10*3/uL (ref 0.00–0.07)
Basophils Absolute: 0 10*3/uL (ref 0.0–0.1)
Basophils Relative: 0 %
Eosinophils Absolute: 0.2 10*3/uL (ref 0.0–0.5)
Eosinophils Relative: 2 %
HCT: 42 % (ref 39.0–52.0)
Hemoglobin: 13.8 g/dL (ref 13.0–17.0)
Immature Granulocytes: 0 %
Lymphocytes Relative: 17 %
Lymphs Abs: 1.1 10*3/uL (ref 0.7–4.0)
MCH: 34.9 pg — ABNORMAL HIGH (ref 26.0–34.0)
MCHC: 32.9 g/dL (ref 30.0–36.0)
MCV: 106.3 fL — ABNORMAL HIGH (ref 80.0–100.0)
Monocytes Absolute: 0.8 10*3/uL (ref 0.1–1.0)
Monocytes Relative: 11 %
Neutro Abs: 4.7 10*3/uL (ref 1.7–7.7)
Neutrophils Relative %: 70 %
Platelets: 141 10*3/uL — ABNORMAL LOW (ref 150–400)
RBC: 3.95 MIL/uL — ABNORMAL LOW (ref 4.22–5.81)
RDW: 12.8 % (ref 11.5–15.5)
WBC: 6.8 10*3/uL (ref 4.0–10.5)
nRBC: 0 % (ref 0.0–0.2)

## 2023-11-30 LAB — BASIC METABOLIC PANEL
Anion gap: 9 (ref 5–15)
BUN: 19 mg/dL (ref 8–23)
CO2: 30 mmol/L (ref 22–32)
Calcium: 9.1 mg/dL (ref 8.9–10.3)
Chloride: 95 mmol/L — ABNORMAL LOW (ref 98–111)
Creatinine, Ser: 1.01 mg/dL (ref 0.61–1.24)
GFR, Estimated: >60 mL/min (ref >60)
Glucose, Bld: 95 mg/dL (ref 70–99)
Potassium: 4.2 mmol/L (ref 3.5–5.1)
Sodium: 134 mmol/L — ABNORMAL LOW (ref 135–145)

## 2023-11-30 LAB — PROTIME-INR
INR: 1 (ref 0.8–1.2)
Prothrombin Time: 13.2 s (ref 11.4–15.2)

## 2023-11-30 MED ORDER — SODIUM CHLORIDE 0.9% FLUSH: 3.0000 mL | INTRAVENOUS | Status: DC | PRN

## 2023-11-30 MED ORDER — LIDOCAINE HCL (PF) 1 % IJ SOLN
INTRAMUSCULAR | Status: AC
  Filled 2023-11-30: qty 30

## 2023-11-30 MED ORDER — TRAMADOL HCL 50 MG PO TABS
50.0000 mg | ORAL_TABLET | Freq: Once | ORAL | Status: AC
  Administered 2023-11-30: 50 mg via ORAL

## 2023-11-30 MED ORDER — CEFAZOLIN SODIUM-DEXTROSE 2-4 GM/100ML-% IV SOLN
INTRAVENOUS | Status: AC
  Filled 2023-11-30: qty 100

## 2023-11-30 MED ORDER — CEFAZOLIN SODIUM-DEXTROSE 2-4 GM/100ML-% IV SOLN
INTRAVENOUS | Status: AC | PRN
  Administered 2023-11-30: 2 g via INTRAVENOUS

## 2023-11-30 MED ORDER — FENTANYL CITRATE (PF) 100 MCG/2ML IJ SOLN
INTRAMUSCULAR | Status: AC
  Filled 2023-11-30: qty 2

## 2023-11-30 MED ORDER — SODIUM CHLORIDE 0.9% FLUSH: 3.0000 mL | Freq: Two times a day (BID) | INTRAVENOUS | Status: DC

## 2023-11-30 MED ORDER — ACETAMINOPHEN 10 MG/ML IV SOLN
1000.0000 mg | INTRAVENOUS | Status: AC
  Administered 2023-11-30: 1000 mg via INTRAVENOUS
  Filled 2023-11-30: qty 100

## 2023-11-30 MED ORDER — MIDAZOLAM HCL 2 MG/2ML IJ SOLN
INTRAMUSCULAR | Status: AC
  Filled 2023-11-30: qty 2

## 2023-11-30 MED ORDER — TRAMADOL HCL 50 MG PO TABS
ORAL_TABLET | ORAL | Status: AC
  Filled 2023-11-30: qty 1

## 2023-11-30 MED ORDER — CEFAZOLIN SODIUM-DEXTROSE 2-4 GM/100ML-% IV SOLN: 2.0000 g | Freq: Once | INTRAVENOUS | Status: DC

## 2023-11-30 MED ORDER — LIDOCAINE HCL (PF) 1 % IJ SOLN
15.0000 mL | Freq: Once | INTRAMUSCULAR | Status: AC
  Administered 2023-11-30: 15 mL via INTRADERMAL

## 2023-11-30 MED ORDER — FENTANYL CITRATE (PF) 100 MCG/2ML IJ SOLN
INTRAMUSCULAR | Status: AC | PRN
Start: 2023-11-30 — End: 2023-11-30
  Administered 2023-11-30 (×3): 25 ug via INTRAVENOUS

## 2023-11-30 MED ORDER — MIDAZOLAM HCL 2 MG/2ML IJ SOLN
INTRAMUSCULAR | Status: AC | PRN
  Administered 2023-11-30 (×3): .5 mg via INTRAVENOUS

## 2023-11-30 NOTE — Procedures (Signed)
 Interventional Radiology Procedure Note  Procedure: L5 KP  Complications: None immediate  Estimated Blood Loss: None  Recommendations: - Bedrest x 1 hr - DC home   Signed,  Sterling Big, MD

## 2023-11-30 NOTE — Progress Notes (Signed)
 Patient has remained clinically stable post IR Kyphoplasty per Dr Archer Asa, vitals have remained stable, given IV tylenol as well as tramadol x1 per orders. Decreasing soreness/discomfort post meds. No excess bleeding at either site posterior back from kyphoplasty. Wife and daughter present with patient with discharge instructions given. Noted that staff from Radiology scheduling would be reaching out to patient post procedure/for return/followup appointment. To restart Eliquis back tomorrow per MD.

## 2023-12-03 MED ORDER — IOHEXOL 300 MG/ML  SOLN
15.0000 mL | Freq: Once | INTRAMUSCULAR | Status: AC | PRN
  Administered 2023-11-30: 15 mL

## 2023-12-15 ENCOUNTER — Ambulatory Visit
Admission: RE | Admit: 2023-12-15 | Discharge: 2023-12-15 | Disposition: A | Discharge status: Home / Self Care | Source: Ambulatory Visit | Attending: Radiology | Admitting: Radiology

## 2023-12-15 DIAGNOSIS — Z8781 Personal history of (healed) traumatic fracture: Secondary | ICD-10-CM

## 2023-12-15 HISTORY — PX: IR RADIOLOGIST EVAL & MGMT: IMG5224
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
## 2023-12-29 ENCOUNTER — Encounter: Payer: Self-pay | Admitting: Family Medicine

## 2023-12-29 ENCOUNTER — Other Ambulatory Visit: Payer: Self-pay | Admitting: Family Medicine

## 2023-12-29 DIAGNOSIS — M48062 Spinal stenosis, lumbar region with neurogenic claudication: Secondary | ICD-10-CM

## 2023-12-30 ENCOUNTER — Ambulatory Visit
Admission: RE | Admit: 2023-12-30 | Discharge: 2023-12-30 | Disposition: A | Discharge status: Home / Self Care | Source: Ambulatory Visit | Attending: Family Medicine | Admitting: Family Medicine

## 2023-12-30 DIAGNOSIS — M48062 Spinal stenosis, lumbar region with neurogenic claudication: Secondary | ICD-10-CM

## 2023-12-31 ENCOUNTER — Other Ambulatory Visit: Payer: Self-pay | Admitting: Family Medicine

## 2023-12-31 DIAGNOSIS — S32000A Wedge compression fracture of unspecified lumbar vertebra, initial encounter for closed fracture: Secondary | ICD-10-CM

## 2023-12-31 DIAGNOSIS — S32040G Wedge compression fracture of fourth lumbar vertebra, subsequent encounter for fracture with delayed healing: Secondary | ICD-10-CM

## 2024-01-05 ENCOUNTER — Ambulatory Visit
Admission: RE | Admit: 2024-01-05 | Discharge: 2024-01-05 | Disposition: A | Discharge status: Home / Self Care | Source: Ambulatory Visit | Attending: Family Medicine | Admitting: Family Medicine

## 2024-01-05 ENCOUNTER — Other Ambulatory Visit: Payer: Self-pay | Admitting: Interventional Radiology

## 2024-01-05 DIAGNOSIS — S32000A Wedge compression fracture of unspecified lumbar vertebra, initial encounter for closed fracture: Secondary | ICD-10-CM

## 2024-01-05 DIAGNOSIS — M8008XA Age-related osteoporosis with current pathological fracture, vertebra(e), initial encounter for fracture: Secondary | ICD-10-CM

## 2024-01-05 DIAGNOSIS — S32040A Wedge compression fracture of fourth lumbar vertebra, initial encounter for closed fracture: Secondary | ICD-10-CM

## 2024-01-05 HISTORY — PX: IR RADIOLOGIST EVAL & MGMT: IMG5224
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
## 2024-01-12 ENCOUNTER — Other Ambulatory Visit (HOSPITAL_COMMUNITY): Payer: Self-pay | Admitting: Student

## 2024-01-12 DIAGNOSIS — S32000A Wedge compression fracture of unspecified lumbar vertebra, initial encounter for closed fracture: Secondary | ICD-10-CM

## 2024-01-12 NOTE — Progress Notes (Signed)
 Patient for IR L4 Kyphoplasty on Wed 01/13/24, I called and spoke with the patient on the phone and gave pre-procedure instructions. Pt was made aware to be here at 12p, last dose of Eliquis  was 01/05/24, NPO after MN prior to procedure as well as driver post procedure/recovery/discharge. Pt stated understanding.  Called 01/12/24

## 2024-01-13 ENCOUNTER — Other Ambulatory Visit: Payer: Self-pay

## 2024-01-13 ENCOUNTER — Ambulatory Visit
Admission: RE | Admit: 2024-01-13 | Discharge: 2024-01-13 | Disposition: A | Discharge status: Home / Self Care | Source: Ambulatory Visit | Attending: Interventional Radiology | Admitting: Interventional Radiology

## 2024-01-13 ENCOUNTER — Encounter: Payer: Self-pay | Admitting: Radiology

## 2024-01-13 DIAGNOSIS — J449 Chronic obstructive pulmonary disease, unspecified: Secondary | ICD-10-CM | POA: Diagnosis not present

## 2024-01-13 DIAGNOSIS — N189 Chronic kidney disease, unspecified: Secondary | ICD-10-CM | POA: Diagnosis not present

## 2024-01-13 DIAGNOSIS — Z951 Presence of aortocoronary bypass graft: Secondary | ICD-10-CM | POA: Diagnosis not present

## 2024-01-13 DIAGNOSIS — Z87891 Personal history of nicotine dependence: Secondary | ICD-10-CM | POA: Diagnosis not present

## 2024-01-13 DIAGNOSIS — I129 Hypertensive chronic kidney disease with stage 1 through stage 4 chronic kidney disease, or unspecified chronic kidney disease: Secondary | ICD-10-CM | POA: Insufficient documentation

## 2024-01-13 DIAGNOSIS — G4733 Obstructive sleep apnea (adult) (pediatric): Secondary | ICD-10-CM | POA: Diagnosis not present

## 2024-01-13 DIAGNOSIS — I2510 Atherosclerotic heart disease of native coronary artery without angina pectoris: Secondary | ICD-10-CM | POA: Insufficient documentation

## 2024-01-13 DIAGNOSIS — Z7901 Long term (current) use of anticoagulants: Secondary | ICD-10-CM | POA: Diagnosis not present

## 2024-01-13 DIAGNOSIS — Z9981 Dependence on supplemental oxygen: Secondary | ICD-10-CM | POA: Insufficient documentation

## 2024-01-13 DIAGNOSIS — M8008XA Age-related osteoporosis with current pathological fracture, vertebra(e), initial encounter for fracture: Secondary | ICD-10-CM | POA: Insufficient documentation

## 2024-01-13 DIAGNOSIS — S32040A Wedge compression fracture of fourth lumbar vertebra, initial encounter for closed fracture: Secondary | ICD-10-CM

## 2024-01-13 DIAGNOSIS — N400 Enlarged prostate without lower urinary tract symptoms: Secondary | ICD-10-CM | POA: Insufficient documentation

## 2024-01-13 DIAGNOSIS — S32000A Wedge compression fracture of unspecified lumbar vertebra, initial encounter for closed fracture: Secondary | ICD-10-CM

## 2024-01-13 HISTORY — PX: IR KYPHO LUMBAR INC FX REDUCE BONE BX UNI/BIL CANNULATION INC/IMAGING: IMG5519

## 2024-01-13 LAB — CBC
HCT: 44.1 % (ref 39.0–52.0)
Hemoglobin: 14.6 g/dL (ref 13.0–17.0)
MCH: 34.9 pg — ABNORMAL HIGH (ref 26.0–34.0)
MCHC: 33.1 g/dL (ref 30.0–36.0)
MCV: 105.5 fL — ABNORMAL HIGH (ref 80.0–100.0)
Platelets: 146 10*3/uL — ABNORMAL LOW (ref 150–400)
RBC: 4.18 MIL/uL — ABNORMAL LOW (ref 4.22–5.81)
RDW: 13 % (ref 11.5–15.5)
WBC: 8.8 10*3/uL (ref 4.0–10.5)
nRBC: 0 % (ref 0.0–0.2)

## 2024-01-13 LAB — PROTIME-INR
INR: 1 (ref 0.8–1.2)
Prothrombin Time: 13.2 s (ref 11.4–15.2)

## 2024-01-13 MED ORDER — LIDOCAINE HCL (PF) 1 % IJ SOLN
15.0000 mL | Freq: Once | INTRAMUSCULAR | Status: AC
  Administered 2024-01-13: 15 mL via INTRADERMAL

## 2024-01-13 MED ORDER — CEFAZOLIN SODIUM-DEXTROSE 2-4 GM/100ML-% IV SOLN
INTRAVENOUS | Status: AC | PRN
  Administered 2024-01-13: 2 g via INTRAVENOUS

## 2024-01-13 MED ORDER — CEFAZOLIN SODIUM-DEXTROSE 2-4 GM/100ML-% IV SOLN
INTRAVENOUS | Status: AC
  Filled 2024-01-13: qty 100

## 2024-01-13 MED ORDER — MIDAZOLAM HCL 2 MG/2ML IJ SOLN
INTRAMUSCULAR | Status: AC
  Filled 2024-01-13: qty 2

## 2024-01-13 MED ORDER — LIDOCAINE HCL (PF) 1 % IJ SOLN
INTRAMUSCULAR | Status: AC
Start: 2024-01-13 — End: ?
  Filled 2024-01-13: qty 30

## 2024-01-13 MED ORDER — FENTANYL CITRATE (PF) 100 MCG/2ML IJ SOLN
INTRAMUSCULAR | Status: AC | PRN
  Administered 2024-01-13: 50 ug via INTRAVENOUS
  Administered 2024-01-13 (×2): 25 ug via INTRAVENOUS

## 2024-01-13 MED ORDER — CEFAZOLIN SODIUM-DEXTROSE 2-4 GM/100ML-% IV SOLN: 2.0000 g | INTRAVENOUS | Status: DC

## 2024-01-13 MED ORDER — MIDAZOLAM HCL 5 MG/5ML IJ SOLN
INTRAMUSCULAR | Status: AC | PRN
  Administered 2024-01-13: .5 mg via INTRAVENOUS
  Administered 2024-01-13: 1 mg via INTRAVENOUS

## 2024-01-13 MED ORDER — FENTANYL CITRATE (PF) 100 MCG/2ML IJ SOLN
INTRAMUSCULAR | Status: AC
  Filled 2024-01-13: qty 2
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
## 2024-01-13 NOTE — Discharge Instructions (Signed)
Kyphoplasty, Care After This sheet gives you information about how to care for yourself after your procedure. Your health care provider may also give you more specific instructions. If you have problems or questions, contact your health care provider. What can I expect after the procedure? After the procedure, it is common to have back pain. Follow these instructions at home: Medicines and Diet Take over-the-counter and prescription medicines only as told by your health care provider. Take over-the-counter or prescription medicines that you normally take.  However, if you are taking Aspirin or an anticoagulant/blood thinner you will be told when you can resume taking these by the healthcare provider.  You may resume a regular diet Eat foods that are high in fiber, such as beans, whole grains, and fresh fruits and vegetables. Limit foods that are high in fat and processed sugars, such as fried or sweet foods. Puncture site care   You may remove bandaid tomorrow after taking a shower.  Replace daily with a clean bandaid until healed. Check your puncture site every day for signs of infection. Check for: Redness, swelling, or pain. Fluid or blood. Warmth. Pus or a bad smell.   Managing pain, stiffness, and swelling If directed, put ice on the painful area. To do this: You may use an ice pack as needed to the injection sites on your back.  Ice to the site 20 minutes on and 20 minutes off, as needed. Place a towel between your skin and the bag.   Activity No driving or operating machinery for 24 hours after your procedure. Do not lift anything that is heavier than a milk jug for 1 to 2 weeks or determined by your physician..  Follow up with your physician in 2 weeks.  Contact a health care provider if: You have a fever greater than 100 degrees You have redness, swelling, or pain at the site of your puncture. You have fluid, blood, or pus coming from the puncture site. You have pain that  gets worse or does not get better with medicine. You develop numbness or weakness in any part of your body. Get help right away if: You have chest pain. You have difficulty breathing. You have weakness, numbness, or tingling in your legs. You cannot control your bladder or bowel movements (incontinence). You suddenly become weak or numb on one side of your body. You become very confused. You have trouble speaking or understanding, or both. These symptoms may represent a serious problem that is an emergency. Do not wait to see if the symptoms will go away. Get medical help right away. Call your local emergency services (911 in the [place] Do not drive yourself to the hospital. Summary Follow instructions from your health care provider about how to take care of your puncture site. Take over-the-counter and prescription medicines only as told by your health care provider. Rest your back and avoid intense physical activity for as long as told by your health care provider. Contact a health care provider if you have pain that gets worse or does not get better with medicine. Keep all follow-up visits. This is important. This information is not intended to replace advice given to you by your health care provider. Make sure you discuss any questions you have with your health care provider. Document Revised: 12/28/2019 Document Reviewed: 12/28/2019 Elsevier Patient Education  2022 [employer]       

## 2024-01-13 NOTE — Progress Notes (Signed)
 Patient clinically stable post IR L4 kyphoplasty per Dr Marne Sings, tolerated well. Received Versed  1.5 mg along with Fentanyl  100 mcg IV for procedure. Report given to Jann Parker Rn post procedure/specials/1.

## 2024-01-21 ENCOUNTER — Encounter: Payer: Medicare Other | Admitting: Dermatology

## 2024-01-28 ENCOUNTER — Encounter: Payer: Medicare Other | Admitting: Dermatology

## 2024-02-09 ENCOUNTER — Encounter: Admitting: Dermatology

## 2024-02-25 ENCOUNTER — Encounter: Admitting: Dermatology

## 2024-03-07 ENCOUNTER — Ambulatory Visit (INDEPENDENT_AMBULATORY_CARE_PROVIDER_SITE_OTHER): Admitting: Dermatology

## 2024-03-07 ENCOUNTER — Encounter: Payer: Self-pay | Admitting: Dermatology

## 2024-03-07 DIAGNOSIS — L814 Other melanin hyperpigmentation: Secondary | ICD-10-CM | POA: Diagnosis not present

## 2024-03-07 DIAGNOSIS — W908XXA Exposure to other nonionizing radiation, initial encounter: Secondary | ICD-10-CM | POA: Diagnosis not present

## 2024-03-07 DIAGNOSIS — Z1283 Encounter for screening for malignant neoplasm of skin: Secondary | ICD-10-CM | POA: Diagnosis not present

## 2024-03-07 DIAGNOSIS — L821 Other seborrheic keratosis: Secondary | ICD-10-CM

## 2024-03-07 DIAGNOSIS — D485 Neoplasm of uncertain behavior of skin: Secondary | ICD-10-CM

## 2024-03-07 DIAGNOSIS — D229 Melanocytic nevi, unspecified: Secondary | ICD-10-CM

## 2024-03-07 DIAGNOSIS — L98429 Non-pressure chronic ulcer of back with unspecified severity: Secondary | ICD-10-CM | POA: Diagnosis not present

## 2024-03-07 DIAGNOSIS — L570 Actinic keratosis: Secondary | ICD-10-CM

## 2024-03-07 DIAGNOSIS — D692 Other nonthrombocytopenic purpura: Secondary | ICD-10-CM

## 2024-03-07 DIAGNOSIS — D492 Neoplasm of unspecified behavior of bone, soft tissue, and skin: Secondary | ICD-10-CM | POA: Diagnosis not present

## 2024-03-07 DIAGNOSIS — D1801 Hemangioma of skin and subcutaneous tissue: Secondary | ICD-10-CM

## 2024-03-07 DIAGNOSIS — Z85828 Personal history of other malignant neoplasm of skin: Secondary | ICD-10-CM

## 2024-03-07 DIAGNOSIS — D0439 Carcinoma in situ of skin of other parts of face: Secondary | ICD-10-CM

## 2024-03-07 DIAGNOSIS — L578 Other skin changes due to chronic exposure to nonionizing radiation: Secondary | ICD-10-CM

## 2024-03-07 NOTE — Patient Instructions (Signed)
Wound Care Instructions  Cleanse wound gently with soap and water once a day then pat dry with clean gauze. Apply a thin coat of Petrolatum (petroleum jelly, "Vaseline") over the wound (unless you have an allergy to this). We recommend that you use a new, sterile tube of Vaseline. Do not pick or remove scabs. Do not remove the yellow or white "healing tissue" from the base of the wound.  Cover the wound with fresh, clean, nonstick gauze and secure with paper tape. You may use Band-Aids in place of gauze and tape if the wound is small enough, but would recommend trimming much of the tape off as there is often too much. Sometimes Band-Aids can irritate the skin.  You should call the office for your biopsy report after 1 week if you have not already been contacted.  If you experience any problems, such as abnormal amounts of bleeding, swelling, significant bruising, significant pain, or evidence of infection, please call the office immediately.  FOR ADULT SURGERY PATIENTS: If you need something for pain relief you may take 1 extra strength Tylenol (acetaminophen) AND 2 Ibuprofen (200mg each) together every 4 hours as needed for pain. (do not take these if you are allergic to them or if you have a reason you should not take them.) Typically, you may only need pain medication for 1 to 3 days.    

## 2024-03-07 NOTE — Progress Notes (Signed)
 Follow-Up Visit   Subjective  Justin [name] is a 83 y.o. male who presents for the following: Skin Cancer Screening and Upper Body Skin Exam  Patient with hx of SCC. He has a spot at back that has been sore for about 3 months. Has been red and has bled.   The patient presents for Upper Body Skin Exam (UBSE) for skin cancer screening and mole check. The patient has spots, moles and lesions to be evaluated, some may be new or changing and the patient may have concern these could be cancer.  Patient accompanied by wife who contributes to history.   The following portions of the chart were reviewed this encounter and updated as appropriate: medications, allergies, medical history  Review of Systems:  No other skin or systemic complaints except as noted in HPI or Assessment and Plan.  Objective  Well appearing patient in no apparent distress; mood and affect are within normal limits.  All skin waist up examined. Relevant physical exam findings are noted in the Assessment and Plan.  lower spinal Back 12 mm keratotic pink plaque  Right Buccal Cheek 7 mm pink keratotic papule  L preauricular x 1 Erythematous thin papules/macules with gritty scale.   Assessment & Plan   NEOPLASM OF UNCERTAIN BEHAVIOR OF SKIN (2) lower spinal Back Skin / nail biopsy Type of biopsy: tangential   Informed consent: discussed and consent obtained   Timeout: patient name, date of birth, surgical site, and procedure verified   Procedure prep:  Patient was prepped and draped in usual sterile fashion Prep type:  Isopropyl alcohol Anesthesia: the lesion was anesthetized in a standard fashion   Anesthetic:  1% lidocaine  w/ epinephrine  1-100,000 buffered w/ 8.4% NaHCO3 Instrument used: DermaBlade   Hemostasis achieved with: pressure and aluminum chloride   Outcome: patient tolerated procedure well   Post-procedure details: sterile dressing applied and wound care instructions given   Dressing type:  bandage and petrolatum   Specimen 1 - Surgical pathology Differential Diagnosis: SCC vs EIC  Check Margins: No Right Buccal Cheek Epidermal / dermal shaving  Lesion diameter (cm):  0.7 Informed consent: discussed and consent obtained   Timeout: patient name, date of birth, surgical site, and procedure verified   Procedure prep:  Patient was prepped and draped in usual sterile fashion Prep type:  Povidone-iodine  and isopropyl alcohol Anesthesia: the lesion was anesthetized in a standard fashion   Anesthetic:  1% lidocaine  w/ epinephrine  1-100,000 buffered w/ 8.4% NaHCO3 Instrument used: DermaBlade   Hemostasis achieved with: pressure and aluminum chloride   Outcome: patient tolerated procedure well   Post-procedure details: wound care instructions given   Specimen 2 - Surgical pathology Differential Diagnosis: SK  Check Margins: No AK (ACTINIC KERATOSIS) L preauricular x 1 Actinic keratoses are precancerous spots that appear secondary to cumulative UV radiation exposure/sun exposure over time. They are chronic with expected duration over 1 year. A portion of actinic keratoses will progress to squamous cell carcinoma of the skin. It is not possible to reliably predict which spots will progress to skin cancer and so treatment is recommended to prevent development of skin cancer.  Recommend daily broad spectrum sunscreen SPF 30+ to sun-exposed areas, reapply every 2 hours as needed.  Recommend staying in the shade or wearing long sleeves, sun glasses (UVA+UVB protection) and wide brim hats (4-inch brim around the entire circumference of the hat). Call for new or changing lesions. Destruction of lesion - L preauricular x 1 Complexity: simple  Destruction method: cryotherapy   Informed consent: discussed and consent obtained   Timeout:  patient name, date of birth, surgical site, and procedure verified Lesion destroyed using liquid nitrogen: Yes   Region frozen until ice ball extended  beyond lesion: Yes   Cryo cycles: 1 or 2. Outcome: patient tolerated procedure well with no complications   Post-procedure details: wound care instructions given   MULTIPLE BENIGN NEVI   SEBORRHEIC KERATOSES   LENTIGINES   ACTINIC ELASTOSIS   CHERRY ANGIOMA   SOLAR PURPURA (HCC)   Skin cancer screening performed today.  Actinic Damage - Chronic condition, secondary to cumulative UV/sun exposure - diffuse scaly erythematous macules with underlying dyspigmentation - Recommend daily broad spectrum sunscreen SPF 30+ to sun-exposed areas, reapply every 2 hours as needed.  - Staying in the shade or wearing long sleeves, sun glasses (UVA+UVB protection) and wide brim hats (4-inch brim around the entire circumference of the hat) are also recommended for sun protection.  - Call for new or changing lesions.  Lentigines, Seborrheic Keratoses, Hemangiomas - Benign normal skin lesions - Benign-appearing - Call for any changes  Melanocytic Nevi - Tan-brown and/or pink-flesh-colored symmetric macules and papules - Benign appearing on exam today - Observation - Call clinic for new or changing moles - Recommend daily use of broad spectrum spf 30+ sunscreen to sun-exposed areas.   HISTORY OF SQUAMOUS CELL CARCINOMA OF THE SKIN - No evidence of recurrence today - No lymphadenopathy - Recommend regular full body skin exams - Recommend daily broad spectrum sunscreen SPF 30+ to sun-exposed areas, reapply every 2 hours as needed.  - Call if any new or changing lesions are noted between office visits  Purpura - Chronic; persistent and recurrent.  Treatable, but not curable. - Violaceous macules and patches - Benign - Related to trauma, age, sun damage and/or use of blood thinners, chronic use of topical and/or oral steroids - Observe - Can use OTC arnica containing moisturizer such as Dermend Bruise Formula if desired - Call for worsening or other concerns   Return in about 6 months  (around 09/06/2024) for UBSE, with Dr. Felipe Horton, HxSCC, HxAK.  Kerstin Peeling, RMA, am acting as scribe for Harris Liming, MD .   Documentation: I have reviewed the above documentation for accuracy and completeness, and I agree with the above.  Harris Liming, MD

## 2024-03-08 ENCOUNTER — Ambulatory Visit: Admit: 2024-03-08 | Admitting: Internal Medicine

## 2024-03-08 DIAGNOSIS — I200 Unstable angina: Secondary | ICD-10-CM

## 2024-03-08 SURGERY — LEFT HEART CATH AND CORONARY ANGIOGRAPHY
Anesthesia: Moderate Sedation | Laterality: Left

## 2024-03-10 LAB — SURGICAL PATHOLOGY

## 2024-03-11 ENCOUNTER — Ambulatory Visit: Payer: Self-pay | Admitting: Dermatology

## 2024-03-14 MED ORDER — FLUOROURACIL 5 % EX CREA: TOPICAL_CREAM | Freq: Two times a day (BID) | CUTANEOUS | 2 refills | Status: AC

## 2024-03-14 NOTE — Telephone Encounter (Signed)
-----   Message from [facility] sent at 03/11/2024  6:13 PM EDT ----- Diagnosis 1. Skin, lower spinal back :       EPIDERMAL ULCERATION SUGGESTIVE OF TRAUMA OR EXCORIATION        2. 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   LOWER BACK Biopsy shows a wound. There was no skin cancer. Continue covering with Vaseline or Aquaphor daily and avoid picking until healed  RIGHT CHEEK Explanation: Biopsy shows a squamous cell skin cancer limited to the top layer of skin. This means it is an early cancer and has not spread. However, it has the potential to spread beyond the skin  and threaten your health, so we recommend treating it.   Treatment option 1: a cream (fluorouracil  and calcipotriene) that helps your immune system clear the skin cancer. It will cause redness and irritation. Wait two weeks after the biopsy to start  applying the cream. Apply the cream twice per day until the redness and irritation develop (usually occurs by day 7), then stop and allow it to heal. We will recheck the area in 2 months to ensure  the cancer is gone. The cream is $45 plus shipping and will be mailed to you from a low cost compounding pharmacy.  Treatment option 2: Mohs surgery, which involves cutting out right around the skin cancer and then checking under the microscope on the same day to ensure the whole skin cancer is out. If there is  more cancer remaining, the surgeon will repeat the process until it is fully cleared. The cure rate is about 98-99%. It is done at another office outside of [place] ([place], [place], or  [place]). Once the Mohs surgeon confirms the skin cancer is out, they will discuss the options to repair or heal the area. You must take it easy for about two weeks after surgery (no lifting over  10-15 lbs, avoid activity to get your heart  rate and blood pressure up). ----- Message ----- From: Interface, Lab In [facility] Sent: 03/10/2024   4:12 PM EDT To: Boneta Sharps, MD

## 2024-03-14 NOTE — Telephone Encounter (Signed)
 Discussed pathology results and treatment options with patient. He prefers 5FU/Calcipotriene treatment. Sent to [place]. Follow up appt scheduled for 05/17/2024.

## 2024-04-05 ENCOUNTER — Encounter: Admitting: Dermatology

## 2024-05-17 ENCOUNTER — Ambulatory Visit: Admitting: Dermatology

## 2024-05-17 ENCOUNTER — Encounter: Payer: Self-pay | Admitting: Dermatology

## 2024-05-17 DIAGNOSIS — D692 Other nonthrombocytopenic purpura: Secondary | ICD-10-CM | POA: Diagnosis not present

## 2024-05-17 DIAGNOSIS — Z86007 Personal history of in-situ neoplasm of skin: Secondary | ICD-10-CM

## 2024-05-17 DIAGNOSIS — S20419D Abrasion of unspecified back wall of thorax, subsequent encounter: Secondary | ICD-10-CM | POA: Diagnosis not present

## 2024-05-17 NOTE — Patient Instructions (Addendum)

## 2024-05-17 NOTE — Progress Notes (Signed)
   Follow-Up Visit   Subjective  Justin [name] is a 83 y.o. male who presents for the following: Recheck bx proven SCCIS site of the R cheek - pt used topical 5FU for 8 days and did experience irritation and redness. Pt concerned about bx site on the back, because it still hasn't healed and is very painful, so painful that it wakes him up at night.  The following portions of the chart were reviewed this encounter and updated as appropriate: medications, allergies, medical history  Review of Systems:  No other skin or systemic complaints except as noted in HPI or Assessment and Plan.  Objective  Well appearing patient in no apparent distress; mood and affect are within normal limits.   A focused examination was performed of the following areas: the face   Relevant exam findings are noted in the Assessment and Plan.    Assessment & Plan   HISTORY OF SQUAMOUS CELL CARCINOMA IN SITU OF THE SKIN - R cheek - No evidence of recurrence today - Recommend regular full body skin exams - Recommend daily broad spectrum sunscreen SPF 30+ to sun-exposed areas, reapply every 2 hours as needed.  - Call if any new or changing lesions are noted between office visits  Purpura - Chronic; persistent and recurrent.  Treatable, but not curable. - Violaceous macules and patches - Benign - Related to trauma, age, sun damage and/or use of blood thinners, chronic use of topical and/or oral steroids - Observe - Can use OTC arnica containing moisturizer such as Dermend Bruise Formula if desired - Call for worsening or other concerns   HISTORY OF SQUAMOUS CELL CARCINOMA IN SITU (SCCIS)   EXCORIATION OF BACK, UNSPECIFIED LATERALITY, SUBSEQUENT ENCOUNTER      Bx proven 03/07/2024, trauma or excoriation, no evidence of skin cancer - Recommend referral to wound care at [facility], very painful, wakes pt up at night. Recommend OTC Medihoney and covering with bandage daily. Related Procedures Ambulatory  referral to Physical Therapy SOLAR PURPURA [facility])    Return for appointment as scheduled.  LILLETTE Rosina Mayans, CMA, am acting as scribe for Boneta Sharps, MD .   Documentation: I have reviewed the above documentation for accuracy and completeness, and I agree with the above.  Boneta Sharps, MD

## 2024-05-19 ENCOUNTER — Other Ambulatory Visit: Payer: Self-pay | Admitting: Student

## 2024-05-19 DIAGNOSIS — H539 Unspecified visual disturbance: Secondary | ICD-10-CM

## 2024-05-19 DIAGNOSIS — R519 Headache, unspecified: Secondary | ICD-10-CM

## 2024-05-26 ENCOUNTER — Ambulatory Visit

## 2024-06-20 ENCOUNTER — Encounter: Attending: Physician Assistant | Admitting: Physician Assistant

## 2024-06-20 DIAGNOSIS — G4730 Sleep apnea, unspecified: Secondary | ICD-10-CM | POA: Insufficient documentation

## 2024-06-20 DIAGNOSIS — I890 Lymphedema, not elsewhere classified: Secondary | ICD-10-CM | POA: Diagnosis not present

## 2024-06-20 DIAGNOSIS — Z7901 Long term (current) use of anticoagulants: Secondary | ICD-10-CM | POA: Diagnosis not present

## 2024-06-20 DIAGNOSIS — I2510 Atherosclerotic heart disease of native coronary artery without angina pectoris: Secondary | ICD-10-CM | POA: Insufficient documentation

## 2024-06-20 DIAGNOSIS — L97811 Non-pressure chronic ulcer of other part of right lower leg limited to breakdown of skin: Secondary | ICD-10-CM | POA: Insufficient documentation

## 2024-06-20 DIAGNOSIS — I5042 Chronic combined systolic (congestive) and diastolic (congestive) heart failure: Secondary | ICD-10-CM | POA: Diagnosis not present

## 2024-06-20 DIAGNOSIS — I7389 Other specified peripheral vascular diseases: Secondary | ICD-10-CM | POA: Diagnosis not present

## 2024-06-20 DIAGNOSIS — I480 Paroxysmal atrial fibrillation: Secondary | ICD-10-CM | POA: Insufficient documentation

## 2024-06-20 DIAGNOSIS — L89893 Pressure ulcer of other site, stage 3: Secondary | ICD-10-CM | POA: Insufficient documentation

## 2024-06-20 DIAGNOSIS — I110 Hypertensive heart disease with heart failure: Secondary | ICD-10-CM | POA: Insufficient documentation

## 2024-06-20 DIAGNOSIS — L97512 Non-pressure chronic ulcer of other part of right foot with fat layer exposed: Secondary | ICD-10-CM | POA: Insufficient documentation

## 2024-06-27 ENCOUNTER — Encounter: Attending: Physician Assistant | Admitting: Physician Assistant

## 2024-06-27 DIAGNOSIS — I2510 Atherosclerotic heart disease of native coronary artery without angina pectoris: Secondary | ICD-10-CM | POA: Diagnosis not present

## 2024-06-27 DIAGNOSIS — I110 Hypertensive heart disease with heart failure: Secondary | ICD-10-CM | POA: Diagnosis not present

## 2024-06-27 DIAGNOSIS — L97512 Non-pressure chronic ulcer of other part of right foot with fat layer exposed: Secondary | ICD-10-CM | POA: Diagnosis not present

## 2024-06-27 DIAGNOSIS — I890 Lymphedema, not elsewhere classified: Secondary | ICD-10-CM | POA: Diagnosis not present

## 2024-06-27 DIAGNOSIS — G4730 Sleep apnea, unspecified: Secondary | ICD-10-CM | POA: Diagnosis not present

## 2024-06-27 DIAGNOSIS — L89893 Pressure ulcer of other site, stage 3: Secondary | ICD-10-CM | POA: Diagnosis present

## 2024-06-27 DIAGNOSIS — I480 Paroxysmal atrial fibrillation: Secondary | ICD-10-CM | POA: Insufficient documentation

## 2024-06-27 DIAGNOSIS — I5042 Chronic combined systolic (congestive) and diastolic (congestive) heart failure: Secondary | ICD-10-CM | POA: Insufficient documentation

## 2024-06-27 DIAGNOSIS — L97811 Non-pressure chronic ulcer of other part of right lower leg limited to breakdown of skin: Secondary | ICD-10-CM | POA: Insufficient documentation

## 2024-06-27 DIAGNOSIS — Z7901 Long term (current) use of anticoagulants: Secondary | ICD-10-CM | POA: Diagnosis not present

## 2024-06-27 DIAGNOSIS — I739 Peripheral vascular disease, unspecified: Secondary | ICD-10-CM | POA: Insufficient documentation

## 2024-07-04 ENCOUNTER — Encounter: Admitting: Physician Assistant

## 2024-07-04 DIAGNOSIS — L89893 Pressure ulcer of other site, stage 3: Secondary | ICD-10-CM | POA: Diagnosis not present

## 2024-07-11 ENCOUNTER — Encounter: Admitting: Physician Assistant

## 2024-07-11 DIAGNOSIS — L89893 Pressure ulcer of other site, stage 3: Secondary | ICD-10-CM | POA: Diagnosis not present

## 2024-07-18 ENCOUNTER — Encounter: Admitting: Physician Assistant

## 2024-07-18 ENCOUNTER — Other Ambulatory Visit (INDEPENDENT_AMBULATORY_CARE_PROVIDER_SITE_OTHER): Payer: Self-pay | Admitting: Physician Assistant

## 2024-07-18 DIAGNOSIS — L89893 Pressure ulcer of other site, stage 3: Secondary | ICD-10-CM | POA: Diagnosis not present

## 2024-07-18 DIAGNOSIS — I890 Lymphedema, not elsewhere classified: Secondary | ICD-10-CM

## 2024-07-18 DIAGNOSIS — I7389 Other specified peripheral vascular diseases: Secondary | ICD-10-CM

## 2024-07-19 ENCOUNTER — Other Ambulatory Visit (INDEPENDENT_AMBULATORY_CARE_PROVIDER_SITE_OTHER)

## 2024-07-19 DIAGNOSIS — L89893 Pressure ulcer of other site, stage 3: Secondary | ICD-10-CM

## 2024-07-19 DIAGNOSIS — I7389 Other specified peripheral vascular diseases: Secondary | ICD-10-CM

## 2024-07-19 DIAGNOSIS — I890 Lymphedema, not elsewhere classified: Secondary | ICD-10-CM | POA: Diagnosis not present

## 2024-07-20 LAB — VAS US ABI WITH/WO TBI

## 2024-07-25 ENCOUNTER — Encounter: Attending: Physician Assistant | Admitting: Physician Assistant

## 2024-07-25 DIAGNOSIS — I2510 Atherosclerotic heart disease of native coronary artery without angina pectoris: Secondary | ICD-10-CM | POA: Insufficient documentation

## 2024-07-25 DIAGNOSIS — I5042 Chronic combined systolic (congestive) and diastolic (congestive) heart failure: Secondary | ICD-10-CM | POA: Insufficient documentation

## 2024-07-25 DIAGNOSIS — L97811 Non-pressure chronic ulcer of other part of right lower leg limited to breakdown of skin: Secondary | ICD-10-CM | POA: Insufficient documentation

## 2024-07-25 DIAGNOSIS — Z7901 Long term (current) use of anticoagulants: Secondary | ICD-10-CM | POA: Insufficient documentation

## 2024-07-25 DIAGNOSIS — I110 Hypertensive heart disease with heart failure: Secondary | ICD-10-CM | POA: Diagnosis not present

## 2024-07-25 DIAGNOSIS — I890 Lymphedema, not elsewhere classified: Secondary | ICD-10-CM | POA: Insufficient documentation

## 2024-07-25 DIAGNOSIS — L89893 Pressure ulcer of other site, stage 3: Secondary | ICD-10-CM | POA: Diagnosis present

## 2024-07-25 DIAGNOSIS — I480 Paroxysmal atrial fibrillation: Secondary | ICD-10-CM | POA: Diagnosis not present

## 2024-07-25 DIAGNOSIS — L97512 Non-pressure chronic ulcer of other part of right foot with fat layer exposed: Secondary | ICD-10-CM | POA: Diagnosis not present

## 2024-07-25 DIAGNOSIS — I7389 Other specified peripheral vascular diseases: Secondary | ICD-10-CM | POA: Insufficient documentation

## 2024-07-25 DIAGNOSIS — G4730 Sleep apnea, unspecified: Secondary | ICD-10-CM | POA: Diagnosis not present

## 2024-08-01 ENCOUNTER — Encounter: Admitting: Physician Assistant

## 2024-08-01 DIAGNOSIS — L89893 Pressure ulcer of other site, stage 3: Secondary | ICD-10-CM | POA: Diagnosis not present

## 2024-08-08 ENCOUNTER — Encounter: Admitting: Physician Assistant

## 2024-08-08 DIAGNOSIS — L89893 Pressure ulcer of other site, stage 3: Secondary | ICD-10-CM | POA: Diagnosis not present

## 2024-08-15 ENCOUNTER — Encounter: Admitting: Physician Assistant

## 2024-08-15 DIAGNOSIS — L89893 Pressure ulcer of other site, stage 3: Secondary | ICD-10-CM | POA: Diagnosis not present

## 2024-08-22 ENCOUNTER — Encounter: Attending: Physician Assistant | Admitting: Physician Assistant

## 2024-08-22 DIAGNOSIS — I890 Lymphedema, not elsewhere classified: Secondary | ICD-10-CM | POA: Insufficient documentation

## 2024-08-22 DIAGNOSIS — I2510 Atherosclerotic heart disease of native coronary artery without angina pectoris: Secondary | ICD-10-CM | POA: Insufficient documentation

## 2024-08-22 DIAGNOSIS — I480 Paroxysmal atrial fibrillation: Secondary | ICD-10-CM | POA: Insufficient documentation

## 2024-08-22 DIAGNOSIS — L89893 Pressure ulcer of other site, stage 3: Secondary | ICD-10-CM | POA: Diagnosis present

## 2024-08-22 DIAGNOSIS — I739 Peripheral vascular disease, unspecified: Secondary | ICD-10-CM | POA: Diagnosis not present

## 2024-08-22 DIAGNOSIS — G4730 Sleep apnea, unspecified: Secondary | ICD-10-CM | POA: Diagnosis not present

## 2024-08-22 DIAGNOSIS — L97512 Non-pressure chronic ulcer of other part of right foot with fat layer exposed: Secondary | ICD-10-CM | POA: Insufficient documentation

## 2024-08-22 DIAGNOSIS — I110 Hypertensive heart disease with heart failure: Secondary | ICD-10-CM | POA: Diagnosis not present

## 2024-08-22 DIAGNOSIS — L97811 Non-pressure chronic ulcer of other part of right lower leg limited to breakdown of skin: Secondary | ICD-10-CM | POA: Diagnosis not present

## 2024-08-22 DIAGNOSIS — I5042 Chronic combined systolic (congestive) and diastolic (congestive) heart failure: Secondary | ICD-10-CM | POA: Insufficient documentation

## 2024-08-22 DIAGNOSIS — Z7901 Long term (current) use of anticoagulants: Secondary | ICD-10-CM | POA: Diagnosis not present

## 2024-08-26 ENCOUNTER — Encounter: Payer: Self-pay | Admitting: General Surgery

## 2024-08-30 ENCOUNTER — Ambulatory Visit: Attending: Cardiovascular Disease | Admitting: Cardiovascular Disease

## 2024-08-30 ENCOUNTER — Encounter: Payer: Self-pay | Admitting: Cardiovascular Disease

## 2024-08-30 VITALS — BP 108/68 | HR 82 | Ht 62.5 in | Wt 153.0 lb

## 2024-08-30 DIAGNOSIS — I200 Unstable angina: Secondary | ICD-10-CM | POA: Diagnosis not present

## 2024-08-30 DIAGNOSIS — I214 Non-ST elevation (NSTEMI) myocardial infarction: Secondary | ICD-10-CM

## 2024-08-30 DIAGNOSIS — I4891 Unspecified atrial fibrillation: Secondary | ICD-10-CM | POA: Diagnosis not present

## 2024-08-30 DIAGNOSIS — I5022 Chronic systolic (congestive) heart failure: Secondary | ICD-10-CM

## 2024-08-30 DIAGNOSIS — Z79899 Other long term (current) drug therapy: Secondary | ICD-10-CM

## 2024-08-30 MED ORDER — PROPRANOLOL HCL ER 80 MG PO CP24: 80.0000 mg | ORAL_CAPSULE | Freq: Every day | ORAL | 3 refills | Status: AC

## 2024-08-30 MED ORDER — POTASSIUM CHLORIDE CRYS ER 20 MEQ PO TBCR: 20.0000 meq | EXTENDED_RELEASE_TABLET | Freq: Every day | ORAL | 3 refills | Status: AC

## 2024-08-30 MED ORDER — METOLAZONE 2.5 MG PO TABS: 2.5000 mg | ORAL_TABLET | Freq: Every day | ORAL | 3 refills | Status: DC | PRN

## 2024-08-30 NOTE — Patient Instructions (Addendum)
 Medication Instructions:   Please make sure you are on propranolol  ER 80 once a day (lasts 24 hrs)  For leg swelling: weight 147 Please take metolazone  2.5 mg 30 min before torsemide  40 mg  Take sparingly 1-2x a week with extra potassium Do not take more than 2 days in a row  If you need a refill on your cardiac medications before your next appointment, please call your pharmacy.   Lab work: No new labs needed  Testing/Procedures: No new testing needed  Follow-Up: At [facility], you and your health needs are our priority.  As part of our continuing mission to provide you with exceptional heart care, we have created designated Provider Care Teams.  These Care Teams include your primary Cardiologist (physician) and Advanced Practice Providers (APPs -  Physician Assistants and Nurse Practitioners) who all work together to provide you with the care you need, when you need it.  You will need a follow up appointment in 2 month  Providers on your designated Care Team:   Lonni Meager, NP Bernardino Bring, PA-C Cadence [place], [place]  COVID-19 Vaccine Information can be found at: [email or web address] For questions related to vaccine distribution or appointments, please email [email or web address] or call [phone].
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
## 2024-08-31 ENCOUNTER — Ambulatory Visit

## 2024-09-01 ENCOUNTER — Encounter: Payer: Self-pay | Admitting: Cardiovascular Disease

## 2024-09-01 LAB — BASIC METABOLIC PANEL WITH GFR
BUN/Creatinine Ratio: 14 (ref 10–24)
BUN: 17 mg/dL (ref 8–27)
CO2: 29 mmol/L (ref 20–29)
Calcium: 9.5 mg/dL (ref 8.6–10.2)
Chloride: 92 mmol/L — ABNORMAL LOW (ref 96–106)
Creatinine, Ser: 1.22 mg/dL (ref 0.76–1.27)
Glucose: 129 mg/dL — ABNORMAL HIGH (ref 70–99)
Potassium: 5 mmol/L (ref 3.5–5.2)
Sodium: 135 mmol/L (ref 134–144)
eGFR: 59 mL/min/1.73 — ABNORMAL LOW (ref >59)

## 2024-09-01 LAB — MAGNESIUM: Magnesium: 2.3 mg/dL (ref 1.6–2.3)

## 2024-09-01 MED ORDER — METOLAZONE 2.5 MG PO TABS: 2.5000 mg | ORAL_TABLET | Freq: Every day | ORAL | 3 refills | Status: AC | PRN

## 2024-09-01 NOTE — Addendum Note (Signed)
 Addended by: CRISTOPHER OLIVIA PARAS on: 09/01/2024 11:10 AM   Modules accepted: Orders

## 2024-09-02 ENCOUNTER — Ambulatory Visit: Payer: Self-pay | Admitting: Cardiovascular Disease

## 2024-09-06 ENCOUNTER — Encounter: Admitting: Internal Medicine

## 2024-09-06 ENCOUNTER — Ambulatory Visit: Admitting: Dermatology

## 2024-09-08 ENCOUNTER — Ambulatory Visit: Admitting: Dermatology

## 2024-09-08 ENCOUNTER — Encounter: Payer: Self-pay | Admitting: Cardiovascular Disease

## 2024-09-09 ENCOUNTER — Encounter: Admitting: Internal Medicine

## 2024-09-09 DIAGNOSIS — L89893 Pressure ulcer of other site, stage 3: Secondary | ICD-10-CM | POA: Diagnosis not present

## 2024-09-23 ENCOUNTER — Encounter: Admitting: Physician Assistant

## 2024-09-24 ENCOUNTER — Other Ambulatory Visit: Payer: Self-pay

## 2024-09-24 ENCOUNTER — Observation Stay
Admission: EM | Admit: 2024-09-24 | Discharge: 2024-09-25 | Disposition: A | Source: Home / Self Care | Attending: Emergency Medicine | Admitting: Emergency Medicine

## 2024-09-24 ENCOUNTER — Emergency Department

## 2024-09-24 DIAGNOSIS — I10 Essential (primary) hypertension: Secondary | ICD-10-CM | POA: Diagnosis present

## 2024-09-24 DIAGNOSIS — K529 Noninfective gastroenteritis and colitis, unspecified: Secondary | ICD-10-CM

## 2024-09-24 DIAGNOSIS — I2510 Atherosclerotic heart disease of native coronary artery without angina pectoris: Secondary | ICD-10-CM | POA: Diagnosis not present

## 2024-09-24 DIAGNOSIS — Z85828 Personal history of other malignant neoplasm of skin: Secondary | ICD-10-CM | POA: Diagnosis not present

## 2024-09-24 DIAGNOSIS — Z79899 Other long term (current) drug therapy: Secondary | ICD-10-CM | POA: Insufficient documentation

## 2024-09-24 DIAGNOSIS — M109 Gout, unspecified: Secondary | ICD-10-CM | POA: Diagnosis present

## 2024-09-24 DIAGNOSIS — E871 Hypo-osmolality and hyponatremia: Secondary | ICD-10-CM | POA: Diagnosis not present

## 2024-09-24 DIAGNOSIS — D696 Thrombocytopenia, unspecified: Secondary | ICD-10-CM | POA: Diagnosis not present

## 2024-09-24 DIAGNOSIS — I482 Chronic atrial fibrillation, unspecified: Secondary | ICD-10-CM | POA: Diagnosis not present

## 2024-09-24 DIAGNOSIS — I130 Hypertensive heart and chronic kidney disease with heart failure and stage 1 through stage 4 chronic kidney disease, or unspecified chronic kidney disease: Secondary | ICD-10-CM | POA: Insufficient documentation

## 2024-09-24 DIAGNOSIS — Z8546 Personal history of malignant neoplasm of prostate: Secondary | ICD-10-CM | POA: Insufficient documentation

## 2024-09-24 DIAGNOSIS — M549 Dorsalgia, unspecified: Secondary | ICD-10-CM | POA: Insufficient documentation

## 2024-09-24 DIAGNOSIS — Z7901 Long term (current) use of anticoagulants: Secondary | ICD-10-CM | POA: Diagnosis not present

## 2024-09-24 DIAGNOSIS — N189 Chronic kidney disease, unspecified: Secondary | ICD-10-CM | POA: Insufficient documentation

## 2024-09-24 DIAGNOSIS — Z87891 Personal history of nicotine dependence: Secondary | ICD-10-CM | POA: Insufficient documentation

## 2024-09-24 DIAGNOSIS — K219 Gastro-esophageal reflux disease without esophagitis: Secondary | ICD-10-CM | POA: Insufficient documentation

## 2024-09-24 DIAGNOSIS — J449 Chronic obstructive pulmonary disease, unspecified: Secondary | ICD-10-CM | POA: Diagnosis present

## 2024-09-24 DIAGNOSIS — Z951 Presence of aortocoronary bypass graft: Secondary | ICD-10-CM | POA: Diagnosis not present

## 2024-09-24 DIAGNOSIS — R112 Nausea with vomiting, unspecified: Secondary | ICD-10-CM | POA: Diagnosis present

## 2024-09-24 DIAGNOSIS — I5022 Chronic systolic (congestive) heart failure: Secondary | ICD-10-CM | POA: Diagnosis present

## 2024-09-24 DIAGNOSIS — Z955 Presence of coronary angioplasty implant and graft: Secondary | ICD-10-CM | POA: Diagnosis not present

## 2024-09-24 DIAGNOSIS — E785 Hyperlipidemia, unspecified: Secondary | ICD-10-CM | POA: Diagnosis present

## 2024-09-24 DIAGNOSIS — J189 Pneumonia, unspecified organism: Secondary | ICD-10-CM | POA: Diagnosis not present

## 2024-09-24 DIAGNOSIS — R7989 Other specified abnormal findings of blood chemistry: Secondary | ICD-10-CM | POA: Diagnosis not present

## 2024-09-24 DIAGNOSIS — J9611 Chronic respiratory failure with hypoxia: Secondary | ICD-10-CM | POA: Diagnosis not present

## 2024-09-24 LAB — CBC WITH DIFFERENTIAL/PLATELET
Abs Immature Granulocytes: 0.18 K/uL — ABNORMAL HIGH (ref 0.00–0.07)
Basophils Absolute: 0 K/uL (ref 0.0–0.1)
Basophils Relative: 0 %
Eosinophils Absolute: 0 K/uL (ref 0.0–0.5)
Eosinophils Relative: 0 %
HCT: 45.5 % (ref 39.0–52.0)
Hemoglobin: 15.5 g/dL (ref 13.0–17.0)
Immature Granulocytes: 1 %
Lymphocytes Relative: 7 %
Lymphs Abs: 1 K/uL (ref 0.7–4.0)
MCH: 35.4 pg — ABNORMAL HIGH (ref 26.0–34.0)
MCHC: 34.1 g/dL (ref 30.0–36.0)
MCV: 103.9 fL — ABNORMAL HIGH (ref 80.0–100.0)
Monocytes Absolute: 1.3 K/uL — ABNORMAL HIGH (ref 0.1–1.0)
Monocytes Relative: 9 %
Neutro Abs: 12.1 K/uL — ABNORMAL HIGH (ref 1.7–7.7)
Neutrophils Relative %: 83 %
Platelets: 156 K/uL (ref 150–400)
RBC: 4.38 MIL/uL (ref 4.22–5.81)
RDW: 12.9 % (ref 11.5–15.5)
WBC: 14.5 K/uL — ABNORMAL HIGH (ref 4.0–10.5)
nRBC: 0 % (ref 0.0–0.2)

## 2024-09-24 LAB — COMPREHENSIVE METABOLIC PANEL WITH GFR
ALT: 12 U/L (ref 0–44)
AST: 23 U/L (ref 15–41)
Albumin: 4.3 g/dL (ref 3.5–5.0)
Alkaline Phosphatase: 86 U/L (ref 38–126)
Anion gap: 11 (ref 5–15)
BUN: 19 mg/dL (ref 8–23)
CO2: 31 mmol/L (ref 22–32)
Calcium: 10.1 mg/dL (ref 8.9–10.3)
Chloride: 81 mmol/L — ABNORMAL LOW (ref 98–111)
Creatinine, Ser: 1.05 mg/dL (ref 0.61–1.24)
GFR, Estimated: >60 mL/min
Glucose, Bld: 104 mg/dL — ABNORMAL HIGH (ref 70–99)
Potassium: 4.2 mmol/L (ref 3.5–5.1)
Sodium: 122 mmol/L — ABNORMAL LOW (ref 135–145)
Total Bilirubin: 1.1 mg/dL (ref 0.0–1.2)
Total Protein: 7 g/dL (ref 6.5–8.1)

## 2024-09-24 LAB — BASIC METABOLIC PANEL WITH GFR
Anion gap: 15 (ref 5–15)
BUN: 16 mg/dL (ref 8–23)
CO2: 21 mmol/L — ABNORMAL LOW (ref 22–32)
Calcium: 9.1 mg/dL (ref 8.9–10.3)
Chloride: 92 mmol/L — ABNORMAL LOW (ref 98–111)
Creatinine, Ser: 0.9 mg/dL (ref 0.61–1.24)
GFR, Estimated: >60 mL/min
Glucose, Bld: 90 mg/dL (ref 70–99)
Potassium: 4.5 mmol/L (ref 3.5–5.1)
Sodium: 127 mmol/L — ABNORMAL LOW (ref 135–145)

## 2024-09-24 LAB — RESP PANEL BY RT-PCR (RSV, FLU A&B, COVID)  RVPGX2
Influenza A by PCR: NEGATIVE
Influenza B by PCR: NEGATIVE
Resp Syncytial Virus by PCR: NEGATIVE
SARS Coronavirus 2 by RT PCR: NEGATIVE

## 2024-09-24 LAB — URINALYSIS, W/ REFLEX TO CULTURE (INFECTION SUSPECTED)
Bacteria, UA: NONE SEEN
Bilirubin Urine: NEGATIVE
Glucose, UA: >=500 mg/dL — AB
Hgb urine dipstick: NEGATIVE
Ketones, ur: NEGATIVE mg/dL
Leukocytes,Ua: NEGATIVE
Nitrite: NEGATIVE
Protein, ur: NEGATIVE mg/dL
RBC / HPF: 0 RBC/hpf (ref 0–5)
Specific Gravity, Urine: 1.023 (ref 1.005–1.030)
pH: 7 (ref 5.0–8.0)

## 2024-09-24 LAB — LIPASE, BLOOD: Lipase: 14 U/L (ref 11–51)

## 2024-09-24 LAB — TROPONIN T, HIGH SENSITIVITY
Troponin T High Sensitivity: 46 ng/L — ABNORMAL HIGH (ref 0–19)
Troponin T High Sensitivity: 39 ng/L — ABNORMAL HIGH (ref 0–19)

## 2024-09-24 LAB — LACTIC ACID, PLASMA: Lactic Acid, Venous: 1.3 mmol/L (ref 0.5–1.9)

## 2024-09-24 MED ORDER — ONDANSETRON HCL 4 MG PO TABS: 4.0000 mg | ORAL_TABLET | Freq: Four times a day (QID) | ORAL | Status: DC | PRN

## 2024-09-24 MED ORDER — ONDANSETRON HCL 4 MG/2ML IJ SOLN: 4.0000 mg | Freq: Four times a day (QID) | INTRAMUSCULAR | Status: DC | PRN

## 2024-09-24 MED ORDER — PANTOPRAZOLE SODIUM 40 MG IV SOLR
40.0000 mg | Freq: Once | INTRAVENOUS | Status: AC
  Administered 2024-09-24: 40 mg via INTRAVENOUS
  Filled 2024-09-24: qty 10

## 2024-09-24 MED ORDER — SODIUM CHLORIDE 0.9 % IV BOLUS
500.0000 mL | Freq: Once | INTRAVENOUS | Status: AC
  Administered 2024-09-24: 500 mL via INTRAVENOUS

## 2024-09-24 MED ORDER — PANTOPRAZOLE SODIUM 40 MG IV SOLR
40.0000 mg | Freq: Two times a day (BID) | INTRAVENOUS | Status: DC
  Administered 2024-09-24 – 2024-09-25 (×3): 40 mg via INTRAVENOUS
  Filled 2024-09-24 (×3): qty 10

## 2024-09-24 MED ORDER — SODIUM CHLORIDE 0.9 % IV SOLN: 12.5000 mg | Freq: Four times a day (QID) | INTRAVENOUS | Status: DC | PRN

## 2024-09-24 MED ORDER — ACETAMINOPHEN 325 MG PO TABS
650.0000 mg | ORAL_TABLET | Freq: Four times a day (QID) | ORAL | Status: DC | PRN
  Administered 2024-09-24 – 2024-09-25 (×2): 650 mg via ORAL
  Filled 2024-09-24 (×2): qty 2

## 2024-09-24 MED ORDER — PROPRANOLOL HCL ER 80 MG PO CP24
80.0000 mg | ORAL_CAPSULE | Freq: Every day | ORAL | Status: DC
  Administered 2024-09-25: 80 mg via ORAL
  Filled 2024-09-24: qty 1

## 2024-09-24 MED ORDER — ENOXAPARIN SODIUM 40 MG/0.4ML IJ SOSY
40.0000 mg | PREFILLED_SYRINGE | INTRAMUSCULAR | Status: DC
  Administered 2024-09-24: 40 mg via SUBCUTANEOUS
  Filled 2024-09-24: qty 0.4

## 2024-09-24 MED ORDER — SODIUM CHLORIDE 0.9 % IV SOLN
500.0000 mg | Freq: Once | INTRAVENOUS | Status: AC
  Administered 2024-09-24: 500 mg via INTRAVENOUS
  Filled 2024-09-24: qty 5

## 2024-09-24 MED ORDER — IOHEXOL 300 MG/ML  SOLN
100.0000 mL | Freq: Once | INTRAMUSCULAR | Status: AC | PRN
  Administered 2024-09-24: 100 mL via INTRAVENOUS

## 2024-09-24 MED ORDER — ALLOPURINOL 100 MG PO TABS
300.0000 mg | ORAL_TABLET | Freq: Every day | ORAL | Status: DC
  Administered 2024-09-25: 300 mg via ORAL
  Filled 2024-09-24: qty 3

## 2024-09-24 MED ORDER — HYDROCODONE-ACETAMINOPHEN 5-325 MG PO TABS: 1.0000 | ORAL_TABLET | ORAL | Status: DC | PRN

## 2024-09-24 MED ORDER — ONDANSETRON HCL 4 MG/2ML IJ SOLN
4.0000 mg | INTRAMUSCULAR | Status: AC
  Administered 2024-09-24: 4 mg via INTRAVENOUS
  Filled 2024-09-24: qty 2

## 2024-09-24 MED ORDER — ISOSORBIDE MONONITRATE ER 60 MG PO TB24
60.0000 mg | ORAL_TABLET | Freq: Every morning | ORAL | Status: DC
  Administered 2024-09-24 – 2024-09-25 (×2): 60 mg via ORAL
  Filled 2024-09-24 (×2): qty 1

## 2024-09-24 MED ORDER — SODIUM CHLORIDE 0.9 % IV SOLN: INTRAVENOUS | Status: DC

## 2024-09-24 MED ORDER — ATORVASTATIN CALCIUM 20 MG PO TABS
40.0000 mg | ORAL_TABLET | Freq: Every day | ORAL | Status: DC
  Administered 2024-09-24: 40 mg via ORAL
  Filled 2024-09-24: qty 2

## 2024-09-24 MED ORDER — MORPHINE SULFATE (PF) 4 MG/ML IV SOLN
4.0000 mg | Freq: Once | INTRAVENOUS | Status: AC
  Administered 2024-09-24: 4 mg via INTRAVENOUS
  Filled 2024-09-24: qty 1

## 2024-09-24 MED ORDER — IPRATROPIUM-ALBUTEROL 0.5-2.5 (3) MG/3ML IN SOLN
3.0000 mL | Freq: Four times a day (QID) | RESPIRATORY_TRACT | Status: DC | PRN
  Filled 2024-09-24: qty 3

## 2024-09-24 MED ORDER — MORPHINE SULFATE (PF) 2 MG/ML IV SOLN
2.0000 mg | INTRAVENOUS | Status: DC | PRN
  Administered 2024-09-24 (×2): 2 mg via INTRAVENOUS
  Filled 2024-09-24 (×2): qty 1

## 2024-09-24 MED ORDER — METOCLOPRAMIDE HCL 5 MG/ML IJ SOLN
10.0000 mg | Freq: Once | INTRAMUSCULAR | Status: AC
  Administered 2024-09-24: 10 mg via INTRAVENOUS
  Filled 2024-09-24: qty 2

## 2024-09-24 MED ORDER — METOCLOPRAMIDE HCL 5 MG/ML IJ SOLN
10.0000 mg | INTRAMUSCULAR | Status: AC
  Administered 2024-09-24: 10 mg via INTRAVENOUS
  Filled 2024-09-24: qty 2

## 2024-09-24 MED ORDER — ACETAMINOPHEN 650 MG RE SUPP: 650.0000 mg | Freq: Four times a day (QID) | RECTAL | Status: DC | PRN

## 2024-09-24 MED ORDER — APIXABAN 5 MG PO TABS
5.0000 mg | ORAL_TABLET | Freq: Two times a day (BID) | ORAL | Status: DC
  Administered 2024-09-25: 5 mg via ORAL
  Filled 2024-09-24: qty 1

## 2024-09-24 NOTE — ED Provider Notes (Addendum)
 "  [facility] Provider Note    Event Date/Time   First MD Initiated Contact with Patient 09/24/24 0124     (approximate)   History   Emesis (As per the patient he has felt sick for the last two days)   HPI Justin [name] is a 84 y.o. male with extensive past medical history that includes but is in no way limited to A-fib with RVR on chronic anticoagulation, CHF, CAD, COPD, prior septic shock, prostate cancer.  He presents tonight by EMS for evaluation of nausea, vomiting, generalized weakness, and general malaise for the last 2 days.  He said he has not been able to keep down his medications as a result of his persistent vomiting.  He is not having chest pain but he did have some abdominal pain earlier.  He may have had some diarrhea.  No fever of which he is not aware.  He chronically uses 3 L of oxygen  by nasal cannula at baseline and he said he feels like he may have been a little bit more short of breath than usual.     Physical Exam   Triage Vital Signs: ED Triage Vitals  Encounter Vitals Group     BP 09/24/24 0133 (!) 127/91     Girls Systolic BP Percentile --      Girls Diastolic BP Percentile --      Boys Systolic BP Percentile --      Boys Diastolic BP Percentile --      Pulse Rate 09/24/24 0133 68     Resp 09/24/24 0133 18     Temp 09/24/24 0133 98.8 F (37.1 C)     Temp Source 09/24/24 0133 Oral     SpO2 --      Weight --      Height --      Head Circumference --      Peak Flow --      Pain Score 09/24/24 0136 0     Pain Loc --      Pain Education --      Exclude from Growth Chart --     Most recent vital signs: Vitals:   09/24/24 0133  BP: (!) 127/91  Pulse: 68  Resp: 18  Temp: 98.8 F (37.1 C)    General: Awake, alert and oriented, but ill-appearing, both chronically and acutely. CV:  Good peripheral perfusion.  Borderline tachycardia, initially irregularly irregular rhythm. Resp:  Normal effort. Speaking easily and  comfortably, no accessory muscle usage nor intercostal retractions.  Some coarse breath sounds but no distress. Abd:  No distention.  Soft but with mild generalized tenderness to palpation, no focal tenderness. Other:  Patient continuing to vomit even after getting Zofran    ED Results / Procedures / Treatments   Labs (all labs ordered are listed, but only abnormal results are displayed) Labs Reviewed  COMPREHENSIVE METABOLIC PANEL WITH GFR - Abnormal; Notable for the following components:      Result Value   Sodium 122 (*)    Chloride 81 (*)    Glucose, Bld 104 (*)    All other components within normal limits  CBC WITH DIFFERENTIAL/PLATELET - Abnormal; Notable for the following components:   WBC 14.5 (*)    MCV 103.9 (*)    MCH 35.4 (*)    Neutro Abs 12.1 (*)    Monocytes Absolute 1.3 (*)    Abs Immature Granulocytes 0.18 (*)    All other components within normal limits  TROPONIN T, HIGH SENSITIVITY - Abnormal; Notable for the following components:   Troponin T High Sensitivity 46 (*)    All other components within normal limits  RESP PANEL BY RT-PCR (RSV, FLU A&B, COVID)  [id number]  GASTROINTESTINAL PANEL BY PCR, STOOL (REPLACES STOOL CULTURE)  C DIFFICILE QUICK SCREEN W PCR REFLEX    LACTIC ACID, PLASMA  LIPASE, BLOOD  URINALYSIS, W/ REFLEX TO CULTURE (INFECTION SUSPECTED)  TROPONIN T, HIGH SENSITIVITY     EKG  ED ECG REPORT I, Darleene Dome, the attending physician, personally viewed and interpreted this ECG.  Date: 09/24/2024 EKG Time: 1:42 AM Rate: 94 Rhythm: Atrial fibrillation QRS Axis: normal Intervals: normal ST/T Wave abnormalities: Non-specific ST segment / T-wave changes, but no clear evidence of acute ischemia. Narrative Interpretation: no definitive evidence of acute ischemia; does not meet STEMI criteria.   ED ECG REPORT I, Darleene Dome, the attending physician, personally viewed and interpreted this ECG.  Date: 09/24/2024 EKG Time: 3:44 AM Rate:  78 Rhythm: Sinus rhythm with first-degree AV block and frequent PVCs QRS Axis: Right axis deviation Intervals: normal ST/T Wave abnormalities: Non-specific ST segment / T-wave changes, but no clear evidence of acute ischemia. Narrative Interpretation: no definitive evidence of acute ischemia; does not meet STEMI criteria.    RADIOLOGY See ED course for details   PROCEDURES:  Critical Care performed: No  .1-3 Lead EKG Interpretation  Performed by: Dome Darleene, MD Authorized by: Dome Darleene, MD     Interpretation: normal     ECG rate:  68   Rhythm: sinus rhythm     Ectopy: PVCs     Conduction: normal   .1-3 Lead EKG Interpretation  Performed by: Dome Darleene, MD Authorized by: Dome Darleene, MD     Interpretation: abnormal     ECG rate:  90   ECG rate assessment: normal     Rhythm: atrial fibrillation     Ectopy: PVCs     Conduction: normal       IMPRESSION / MDM / ASSESSMENT AND PLAN / ED COURSE  I reviewed the triage vital signs and the nursing notes.                              Differential diagnosis includes, but is not limited to, viral illness, pneumonia, intra-abdominal infection, urinary tract infection, electrolyte or metabolic abnormality, renal dysfunction.  Patient's presentation is most consistent with acute presentation with potential threat to life or bodily function.  Labs/studies ordered: High-sensitivity troponin x 2, GI pathogen panel, C. difficile PCR, lactic acid, lipase, respiratory viral panel, CMP, CBC with differential, urinalysis, CT chest/abdomen/pelvis, EKG  Interventions/Medications given:  Medications  azithromycin  (ZITHROMAX ) 500 mg in sodium chloride  0.9 % 250 mL IVPB (500 mg Intravenous New Bag/Given 09/24/24 0426)  ondansetron  (ZOFRAN ) injection 4 mg (4 mg Intravenous Given 09/24/24 0152)  sodium chloride  0.9 % bolus 500 mL (500 mLs Intravenous New Bag/Given 09/24/24 0348)  pantoprazole  (PROTONIX ) injection 40 mg (40 mg  Intravenous Given 09/24/24 0348)  metoCLOPramide  (REGLAN ) injection 10 mg (10 mg Intravenous Given 09/24/24 0348)  iohexol  (OMNIPAQUE ) 300 MG/ML solution 100 mL (100 mLs Intravenous Contrast Given 09/24/24 0327)  morphine  (PF) 4 MG/ML injection 4 mg (4 mg Intravenous Given 09/24/24 0429)    (Note:  hospital course my include additional interventions and/or labs/studies not listed above.)   Patient appears chronically ill but is also acutely ill and vomiting upon arrival and even after  receiving Zofran  4 mg IV.  Patient is denying pain in the abdomen but winces and guards when I press on his abdomen.  I am initiating a broad workup but based on his initial appearance I suspect he will need admission.    Initial labs are notable for hyponatremia of 122 which is acute as of 3 weeks ago.  Leukocytosis of 14.5 with a normal hemoglobin.  Lactic acid is within normal limits.  The patient is on the cardiac monitor to evaluate for evidence of arrhythmia and/or significant heart rate changes.   Clinical Course as of 09/24/24 0543  Sat Sep 24, 2024  9683 Patient still feeling nauseated and vomiting up a small amount of bile, so I ordered Reglan  10 mg IV.  He is also reporting heartburn so I ordered pantoprazole  40 mg IV.  I added on a troponin  [CF]  0356 Resp panel by RT-PCR (RSV, Flu A&B, Covid) Anterior Nasal Swab negative [CF]  0357 CT CHEST ABDOMEN PELVIS W CONTRAST I independently viewed and interpreted the patient's CT chest/abdomen/pelvis and I see no evidence of pneumonia nor of bowel obstruction.  The radiologist identified what appears to be an infectious or inflammatory enteritis. [CF]  0407 Given the patient's hyponatremia which is acute, suggestion of demand ischemia given a troponin of 46, leukocytosis of 14.5, intractable nausea and vomiting, and what appears to be infectious enteritis on scan, and his advanced age, the patient needs hospitalization for stabilization.  I ordered azithromycin  500  mg IV to begin antibiosis for possible infectious enteritis given that I am not concerned about suspected bacteremia or invasive infection (he has minimal diarrhea if any, symptoms seem to be mostly gastritis but with enteritis seen on CT).   [CF]  9591 I am consulting the hospitalist team for admission.   [CF]  0413 Patient is also now reporting back pain and I am ordering morphine  4 mg IV [CF]  0433 I consulted by phone with the admitting hospitalist, and they will admit the patient - Dr. Cleatus. [CF]  [phone] Troponin T High Sensitivity(!): 39 [CF]  0447 Slightly improved troponin  [CF]  0543 I consulted by phone with the admitting hospitalist, and they will admit the patient - Dr. Cleatus [CF]    Clinical Course User Index [CF] Gordan Huxley, MD     FINAL CLINICAL IMPRESSION(S) / ED DIAGNOSES   Final diagnoses:  Enteritis  Intractable nausea and vomiting  Acute hyponatremia  Elevated troponin level  Acute back pain, unspecified back location, unspecified back pain laterality  Chronic respiratory failure with hypoxia, on home O2 therapy (HCC)     Rx / DC Orders   ED Discharge Orders     None        Note:  This document was prepared using Dragon voice recognition software and may include unintentional dictation errors.   Gordan Huxley, MD 09/24/24 9565    Gordan Huxley, MD 09/24/24 0543  "
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
## 2024-09-24 NOTE — ED Notes (Signed)
 Messaged pharmacy to retime overdue unverified meds.

## 2024-09-24 NOTE — Evaluation (Signed)
 Occupational Therapy Evaluation Patient Details Name: Justin [name] MRN: [id number] DOB: 07-03-1941 Today's Date: 09/24/2024   History of Present Illness   Pt is an 84 y.o. male who came into ED complaining of nausea, vomiting, and generalized weakness for the last 2 days with inability to keep anything down. Current MD assessment:  Acute viral gastroenteritis, possible CAP and hyponatremia. PMH of HTN, HLD, CAD, A-fib on Eliquis , COPD on home oxygen  3 L by nasal cannula, history of septic shock, prostate cancer     Clinical Impressions Pt was seen for OT evaluation this date. PTA, he resides at home with his wife in a 2 level home, but lives on the main level. Reports being MOD I with ADLs and mobility using his rollator household and limited community distances. Does wear 02 at baseline.  Pt presents with deficits in strength and activity tolerance limiting their ability to perform ADL management at baseline level. Pt currently requires Min to SBA for bed mobility for BLE management and increased time. He was able to stand from EOB to RW with CGA and take lateral steps to foot of bed and back to [facility] with RW and CGA, anticipate would have been able to transfer and ambulate further however pt declined as lab came in to draw blood and pt's family to arrive soon. Anticipate he will be able to safely return home with follow up therapy services upon DC. Will follow acutely to maximize strength and promote return to baseline function. Pt appeared stable during session, unable to obtain good pleth for 02 reading during session with pt on 3L 02.     If plan is discharge home, recommend the following:   A little help with walking and/or transfers;A little help with bathing/dressing/bathroom;Assistance with cooking/housework;Help with stairs or ramp for entrance     Functional Status Assessment   Patient has had a recent decline in their functional status and demonstrates the ability to make  significant improvements in function in a reasonable and predictable amount of time.     Equipment Recommendations   BSC/3in1     Recommendations for Other Services         Precautions/Restrictions   Precautions Precautions: Fall Recall of Precautions/Restrictions: Intact Restrictions Weight Bearing Restrictions Per Provider Order: No     Mobility Bed Mobility Overal bed mobility: Needs Assistance Bed Mobility: Supine to Sit, Sit to Supine     Supine to sit: Contact guard, HOB elevated, Used rails, Supervision Sit to supine: Min assist   General bed mobility comments: increased time/effort with use of bed features to reach EOB, assist for BLE management to return to supine    Transfers Overall transfer level: Needs assistance Equipment used: Rolling walker (2 wheels) Transfers: Sit to/from Stand Sit to Stand: Contact guard assist           General transfer comment: able to stand from lowest bed height with CGA and perform lateral steps to foot of bed and back to [facility] with no LOB, declined getting to recliner as lab came in to draw blood and family about to arrive      Balance Overall balance assessment: Needs assistance Sitting-balance support: Feet supported Sitting balance-Leahy Scale: Good     Standing balance support: Reliant on assistive device for balance, Bilateral upper extremity supported Standing balance-Leahy Scale: Fair Standing balance comment: RW  ADL either performed or assessed with clinical judgement   ADL Overall ADL's : Needs assistance/impaired                         Toilet Transfer: Contact guard assist;Rolling walker (2 wheels) Toilet Transfer Details (indicate cue type and reason): anticipate based on lateral steps along side of bed as pt did not wish to get in recliner this date         Functional mobility during ADLs: Contact guard assist;Rolling walker (2 wheels)        Vision         Perception         Praxis         Pertinent Vitals/Pain Pain Assessment Pain Assessment: Faces Faces Pain Scale: Hurts a little bit Pain Location: back and arms after blood draw Pain Descriptors / Indicators: Discomfort Pain Intervention(s): Monitored during session, Repositioned     Extremity/Trunk Assessment Upper Extremity Assessment Upper Extremity Assessment: Generalized weakness   Lower Extremity Assessment Lower Extremity Assessment: Generalized weakness       Communication Communication Communication: Impaired Factors Affecting Communication: Hearing impaired (hearing aids)   Cognition Arousal: Alert Behavior During Therapy: WFL for tasks assessed/performed                                 Following commands: Intact       Cueing  General Comments      on 3L throughout session, states he wears 02 at home, but can go without it a few hours in the AM--unable to get good pleth during session appeared stable   Exercises Other Exercises Other Exercises: Edu on role of OT in acute setting.   Shoulder Instructions      Home Living Family/patient expects to be discharged to:: Private residence Living Arrangements: Spouse/significant other Available Help at Discharge: Family;Available 24 hours/day Type of Home: House       Home Layout: Two level;Able to live on main level with bedroom/bathroom     Bathroom Shower/Tub: Producer, Television/film/video: Standard     Home Equipment: Agricultural Consultant (2 wheels);Rollator (4 wheels);Shower seat;Cane - single point;Other (comment) (adjustable bed)          Prior Functioning/Environment Prior Level of Function : Independent/Modified Independent             Mobility Comments: reports use of rollator for household and outdoor mobility when the weather is nice; denies falls in the last 2 years ADLs Comments: MOD I with ADLs    OT Problem List: Decreased  strength;Decreased activity tolerance   OT Treatment/Interventions: Self-care/ADL training;Therapeutic exercise;Patient/family education;Balance training;Therapeutic activities;DME and/or AE instruction;Energy conservation      OT Goals(Current goals can be found in the care plan section)   Acute Rehab OT Goals Patient Stated Goal: go home OT Goal Formulation: With patient Time For Goal Achievement: 10/08/24 Potential to Achieve Goals: Good ADL Goals Pt Will Perform Grooming: with modified independence;sitting;with supervision;standing Pt Will Perform Lower Body Dressing: with set-up;with supervision;sitting/lateral leans;sit to/from stand Pt Will Transfer to Toilet: with modified independence;with supervision;ambulating   OT Frequency:  Min 2X/week    Co-evaluation              AM-PAC OT 6 Clicks Daily Activity     Outcome Measure Help from another person eating meals?: A Little Help from another person taking care of personal  grooming?: A Little Help from another person toileting, which includes using toliet, bedpan, or urinal?: A Little Help from another person bathing (including washing, rinsing, drying)?: A Little Help from another person to put on and taking off regular upper body clothing?: A Little Help from another person to put on and taking off regular lower body clothing?: A Little 6 Click Score: 18   End of Session Equipment Utilized During Treatment: Gait belt;Rolling walker (2 wheels) Nurse Communication: Mobility status  Activity Tolerance: Patient tolerated treatment well;Patient limited by fatigue Patient left: in bed;with call bell/phone within reach;with bed alarm set;with family/visitor present  OT Visit Diagnosis: Other abnormalities of gait and mobility (R26.89);Muscle weakness (generalized) (M62.81)                Time: 8542-8483 OT Time Calculation (min): 19 min Charges:  OT General Charges $OT Visit: 1 Visit OT Evaluation $OT Eval  Moderate Complexity: 1 Mod  Justin [name], OTR/L 09/24/2024, 4:43 PM  Justin [name] 09/24/2024, 4:40 PM

## 2024-09-24 NOTE — ED Notes (Signed)
 Helped pt sit at edge of bed to eat. Pt insists on having legs down. Nonslip socks placed. Call bell within reach.

## 2024-09-24 NOTE — ED Triage Notes (Signed)
 Patient has had emesis for the past two days.

## 2024-09-24 NOTE — ED Notes (Signed)
 Legs back up in bed and pt pulled up in bed. Pt ate small amt jello and still feels nauseous.

## 2024-09-25 ENCOUNTER — Encounter: Payer: Self-pay | Admitting: Internal Medicine

## 2024-09-25 DIAGNOSIS — I10 Essential (primary) hypertension: Secondary | ICD-10-CM | POA: Diagnosis not present

## 2024-09-25 DIAGNOSIS — R7989 Other specified abnormal findings of blood chemistry: Secondary | ICD-10-CM | POA: Diagnosis not present

## 2024-09-25 DIAGNOSIS — K529 Noninfective gastroenteritis and colitis, unspecified: Secondary | ICD-10-CM | POA: Diagnosis not present

## 2024-09-25 DIAGNOSIS — R112 Nausea with vomiting, unspecified: Secondary | ICD-10-CM

## 2024-09-25 DIAGNOSIS — J9611 Chronic respiratory failure with hypoxia: Secondary | ICD-10-CM

## 2024-09-25 DIAGNOSIS — Z8546 Personal history of malignant neoplasm of prostate: Secondary | ICD-10-CM

## 2024-09-25 DIAGNOSIS — Z9981 Dependence on supplemental oxygen: Secondary | ICD-10-CM | POA: Diagnosis not present

## 2024-09-25 DIAGNOSIS — E871 Hypo-osmolality and hyponatremia: Principal | ICD-10-CM

## 2024-09-25 DIAGNOSIS — E785 Hyperlipidemia, unspecified: Secondary | ICD-10-CM | POA: Diagnosis not present

## 2024-09-25 DIAGNOSIS — I5022 Chronic systolic (congestive) heart failure: Secondary | ICD-10-CM

## 2024-09-25 LAB — BASIC METABOLIC PANEL WITH GFR
Anion gap: 11 (ref 5–15)
BUN: 15 mg/dL (ref 8–23)
CO2: 28 mmol/L (ref 22–32)
Calcium: 8.7 mg/dL — ABNORMAL LOW (ref 8.9–10.3)
Chloride: 94 mmol/L — ABNORMAL LOW (ref 98–111)
Creatinine, Ser: 0.79 mg/dL (ref 0.61–1.24)
GFR, Estimated: >60 mL/min
Glucose, Bld: 78 mg/dL (ref 70–99)
Potassium: 3.7 mmol/L (ref 3.5–5.1)
Sodium: 133 mmol/L — ABNORMAL LOW (ref 135–145)

## 2024-09-25 LAB — CBC
HCT: 38.8 % — ABNORMAL LOW (ref 39.0–52.0)
Hemoglobin: 13 g/dL (ref 13.0–17.0)
MCH: 35.8 pg — ABNORMAL HIGH (ref 26.0–34.0)
MCHC: 33.5 g/dL (ref 30.0–36.0)
MCV: 106.9 fL — ABNORMAL HIGH (ref 80.0–100.0)
Platelets: 111 K/uL — ABNORMAL LOW (ref 150–400)
RBC: 3.63 MIL/uL — ABNORMAL LOW (ref 4.22–5.81)
RDW: 13.1 % (ref 11.5–15.5)
WBC: 11 K/uL — ABNORMAL HIGH (ref 4.0–10.5)
nRBC: 0 % (ref 0.0–0.2)

## 2024-09-25 NOTE — Hospital Course (Addendum)
 Partly taken from H&P.  Justin [name] is a pleasant 84 y.o. male with medical history significant for HTN, HLD, CAD, A-fib on Eliquis , COPD  on home oxygen  3 L by nasal cannula, history of septic shock, prostate cancer who came into ED complaining of nausea vomiting and generalized weakness for the last 2 days.   On presentation vital stable, labs with hyponatremia with sodium at 122, leukocytosis 15.5.  Respiratory panel negative.  Troponin 46>>39. EKG with A-fib.  CT chest with possible effusion versus infiltrate.  Patient was started on ceftriaxone and Zithromax  for concern of pneumonia.  1/4: Vital stable on 3 L of oxygen  which is his baseline, improving leukocytosis now at 1, platelets at 111, sodium improved to 133, patient did not had any upper respiratory symptoms so no concern of pneumonia.  Likely had some food poisoning or viral gastroenteritis which has been resolved.  Patient did not had any more nausea, vomiting or diarrhea since in the hospital.  Able to tolerate diet.  PT evaluated him and recommended home health which was ordered.  Patient is on baseline oxygen  use of 3 L.  He wants to go home.  Patient is being discharged with no new medications and advised to keep himself well-hydrated and follow-up with his primary care provider for further assistance.

## 2024-09-25 NOTE — Discharge Summary (Signed)
 " Physician Discharge Summary   Patient: Justin [name] MRN: [id number] DOB: [birth year]  Admit date:     09/24/2024  Discharge date: 09/25/2024  Discharge Physician: Amaryllis Dare   PCP: Cleotilde Oneil FALCON, MD   Recommendations at discharge:  Please obtain CBC and BMP and follow-up Follow-up with primary care provider  Discharge Diagnoses: Principal Problem:   Acute hyponatremia Active Problems:   CAD (coronary artery disease)   HLD (hyperlipidemia)   H/O malignant neoplasm of prostate   Thrombocytopenia   Atrial fibrillation, chronic (HCC)   HTN (hypertension)   COPD (chronic obstructive pulmonary disease) (HCC)   Chronic systolic CHF (congestive heart failure) (HCC)   Gout   Chronic respiratory failure with hypoxia, on home O2 therapy (HCC)   Elevated troponin level   Intractable nausea and vomiting   Enteritis   Hospital Course: Partly taken from H&P.  Justin [name] is a pleasant 84 y.o. male with medical history significant for HTN, HLD, CAD, A-fib on Eliquis , COPD  on home oxygen  3 L by nasal cannula, history of septic shock, prostate cancer who came into ED complaining of nausea vomiting and generalized weakness for the last 2 days.   On presentation vital stable, labs with hyponatremia with sodium at 122, leukocytosis 15.5.  Respiratory panel negative.  Troponin 46>>39. EKG with A-fib.  CT chest with possible effusion versus infiltrate.  Patient was started on ceftriaxone and Zithromax  for concern of pneumonia.  1/4: Vital stable on 3 L of oxygen  which is his baseline, improving leukocytosis now at 1, platelets at 111, sodium improved to 133, patient did not had any upper respiratory symptoms so no concern of pneumonia.  Likely had some food poisoning or viral gastroenteritis which has been resolved.  Patient did not had any more nausea, vomiting or diarrhea since in the hospital.  Able to tolerate diet.  PT evaluated him and recommended home health which was ordered.  Patient  is on baseline oxygen  use of 3 L.  He wants to go home.  Patient is being discharged with no new medications and advised to keep himself well-hydrated and follow-up with his primary care provider for further assistance.  Consultants: None Procedures performed: None Disposition: Home health Diet recommendation:  Cardiac diet DISCHARGE MEDICATION: Allergies as of 09/25/2024       Reactions   Hydrochlorothiazide-triamterene Nausea Only   Hydrocodone -acetaminophen  Other (See Comments)   Halucinations        Medication List     STOP taking these medications    methocarbamol  500 MG tablet Commonly known as: ROBAXIN    oxyCODONE -acetaminophen  5-325 MG tablet Commonly known as: PERCOCET/ROXICET       TAKE these medications    acetaminophen  325 MG tablet Commonly known as: TYLENOL  Take 650-975 mg by mouth See admin instructions. Take 2 tablets (650mg ) by mouth daily before breakfast, 2 tablets (650mg ) by mouth at lunchtime and take 3 tablets (975mg ) by mouth every night   albuterol  108 (90 Base) MCG/ACT inhaler Commonly known as: VENTOLIN  HFA Inhale 2 puffs into the lungs every 4 (four) hours as needed for wheezing or shortness of breath.   allopurinol  300 MG tablet Commonly known as: ZYLOPRIM  Take 300 mg by mouth daily.   apixaban  5 MG Tabs tablet Commonly known as: ELIQUIS  Take 5 mg by mouth 2 (two) times daily.   atorvastatin  80 MG tablet Commonly known as: LIPITOR Take 40 mg by mouth daily at 12 noon.   Benefiber Drink Mix Pack Take 1 packet  by mouth daily with breakfast.   Calcium  Carbonate-Vitamin D3 600-400 MG-UNIT Tabs Take 1 tablet by mouth in the morning and at bedtime.   cholecalciferol  25 MCG (1000 UNIT) tablet Commonly known as: VITAMIN D3 Take 3,000 Units by mouth daily at 12 noon.   colchicine 0.6 MG tablet Take by mouth.   cyanocobalamin  1000 MCG tablet Commonly known as: VITAMIN B12 Take 1,000 mcg by mouth daily at 12 noon.   Emergen-C  Vitamin C  Pack Take 1 packet by mouth daily.   empagliflozin 25 MG Tabs tablet Commonly known as: JARDIANCE Take 12.5 mg by mouth daily.   Ferrous Gluconate  324 (37.5 Fe) MG Tabs Take 1 tablet by mouth.   fexofenadine 180 MG tablet Commonly known as: ALLEGRA Take 180 mg by mouth daily before breakfast.   fluorouracil  5 % cream Commonly known as: EFUDEX  Apply topically 2 (two) times daily. Apply twice per day to the area where the skin cancer was and a quarter inch around that area. Apply until the redness and irritation develop (usually occurs by day 7), then stop and allow it to heal. Protect the area from sunlight while it is healing with a hat or SPF30+ sunscreen.   fluticasone  50 MCG/ACT nasal spray Commonly known as: FLONASE  Place 2 sprays into both nostrils at bedtime.   gabapentin  300 MG capsule Commonly known as: NEURONTIN  Take 300 mg by mouth at bedtime.   hydrocortisone  25 MG suppository Commonly known as: ANUSOL -HC Place 25 mg rectally daily as needed for hemorrhoids or anal itching.   isosorbide  mononitrate 60 MG 24 hr tablet Commonly known as: IMDUR  Take 60 mg by mouth in the morning.   magnesium  oxide 400 (240 Mg) MG tablet Commonly known as: MAG-OX Take 400 mg by mouth 2 (two) times daily.   metolazone  2.5 MG tablet Commonly known as: ZAROXOLYN  Take 1 tablet (2.5 mg total) by mouth daily as needed.   niacin  500 MG tablet Commonly known as: VITAMIN B3 Take 500 mg by mouth in the morning, at noon, and at bedtime.   nitroGLYCERIN  0.4 MG SL tablet Commonly known as: NITROSTAT  Place 0.4 mg under the tongue every 5 (five) minutes x 3 doses as needed for chest pain.   omeprazole 40 MG capsule Commonly known as: PRILOSEC Take 40 mg by mouth 2 (two) times daily.   ondansetron  4 MG disintegrating tablet Commonly known as: ZOFRAN -ODT Take 4 mg by mouth every 8 (eight) hours as needed for nausea or vomiting.   polyethylene glycol 17 g packet Commonly  known as: MIRALAX  / GLYCOLAX  Take 17 g by mouth daily as needed for moderate constipation.   potassium chloride  SA 20 MEQ tablet Commonly known as: KLOR-CON  M Take 1 tablet (20 mEq total) by mouth daily. With extra as needed   PROBIOTIC ACIDOPHILUS PO Take 2 capsules by mouth daily at 12 noon.   propranolol  ER 80 MG 24 hr capsule Commonly known as: INDERAL  LA Take 1 capsule (80 mg total) by mouth daily.   senna-docusate 8.6-50 MG tablet Commonly known as: Senokot-S Take 2 tablets by mouth 2 (two) times daily.   spironolactone 25 MG tablet Commonly known as: ALDACTONE Take 25 mg by mouth daily.   Tiotropium Bromide-Olodaterol 2.5-2.5 MCG/ACT Aers Inhale 2 puffs into the lungs in the morning.   torsemide  20 MG tablet Commonly known as: DEMADEX  Take 20 mg by mouth daily.   traMADol  50 MG tablet Commonly known as: ULTRAM  Take by mouth.  Follow-up Information     Cleotilde Oneil FALCON, MD. Schedule an appointment as soon as possible for a visit in 1 week(s).   Specialty: Internal Medicine Contact information: [facility] [address] [place] [address] [phone]                Discharge Exam: Fredricka Weights   09/24/24 0527  Weight: 69.1 kg   General.  Frail elderly man, in no acute distress. Pulmonary.  Lungs clear bilaterally, normal respiratory effort. CV.  Regular rate and rhythm, no JVD, rub or murmur. Abdomen.  Soft, nontender, nondistended, BS positive. CNS.  Alert and oriented .  No focal neurologic deficit. Extremities.  No edema,  pulses intact and symmetrical. Psychiatry.  Judgment and insight appears normal.   Condition at discharge: stable  The results of significant diagnostics from this hospitalization (including imaging, microbiology, ancillary and laboratory) are listed below for reference.   Imaging Studies: CT CHEST ABDOMEN PELVIS W CONTRAST Result Date: 09/24/2024 EXAM: CT CHEST, ABDOMEN AND PELVIS WITH CONTRAST  09/24/2024 03:38:10 AM TECHNIQUE: CT of the chest, abdomen and pelvis was performed with the administration of 100 mL of iohexol  (OMNIPAQUE ) 300 MG/ML solution. Multiplanar reformatted images are provided for review. Automated exposure control, iterative reconstruction, and/or weight based adjustment of the mA/kV was utilized to reduce the radiation dose to as low as reasonably achievable. COMPARISON: 07/05/2021. CLINICAL HISTORY: Sepsis; N/V, abdominal pain, and worsening SOB in the setting of chronic respiratory failure. FINDINGS: CHEST: MEDIASTINUM AND LYMPH NODES: Coronary artery disease and prior CABG. The central airways are clear. No mediastinal, hilar or axillary lymphadenopathy. Aortic atherosclerosis is present. There is a large hiatal hernia. LUNGS AND PLEURA: Elevation of the right hemidiaphragm with right base atelectasis or scarring. Centrilobular and paraseptal emphysema. No focal consolidation or pulmonary edema. No pleural effusion. No pneumothorax. ABDOMEN AND PELVIS: LIVER: Unremarkable. GALLBLADDER AND BILE DUCTS: Unremarkable. No biliary ductal dilatation. SPLEEN: No acute abnormality. PANCREAS: No acute abnormality. ADRENAL GLANDS: No acute abnormality. KIDNEYS, URETERS AND BLADDER: A 2 cm exophytic cyst off the upper pole of the right kidney is stable since the prior study. There is a 6 mm left lower pole nonobstructing renal stone. No other stones in the kidneys or ureters. No hydronephrosis. No perinephric or periureteral stranding. Urinary bladder is unremarkable. GI AND BOWEL: Stomach demonstrates no acute abnormality. Thick-walled and mildly dilated jejunal loops are most compatible with infectious or inflammatory enteritis. Sigmoid diverticulosis is present. The appendix is normal. No bowel obstruction. REPRODUCTIVE ORGANS: No acute abnormality. PERITONEUM AND RETROPERITONEUM: No ascites. No free air. VASCULATURE: Aorta is normal in caliber. Diffuse aortic atherosclerosis is present.  ABDOMINAL AND PELVIS LYMPH NODES: No lymphadenopathy. 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 No acute osseous abnormality. No focal soft tissue abnormality. IMPRESSION: 1. Thick-walled and mildly dilated jejunal loops, most compatible with infectious or inflammatory enteritis. No bowel obstruction. 2. Elevation of the right hemidiaphragm with right base atelectasis or scarring. 3. Centrilobular and paraseptal emphysema. 4. Large hiatal hernia. 5. Aortic atherosclerosis. 6. Left lower pole nonobstructing nephrolithiasis. Electronically signed by: Franky Crease MD 09/24/2024 03:52 AM EST RP Workstation: [id number]    Microbiology: Results for orders placed or performed during the hospital encounter of 09/24/24  Resp panel by RT-PCR (RSV, Flu A&B, Covid) Anterior Nasal Swab     Status: None   Collection Time: 09/24/24  1:47 AM   Specimen: Anterior Nasal Swab  Result Value Ref Range Status   SARS Coronavirus  2 by RT PCR NEGATIVE NEGATIVE Final    Comment: (NOTE) SARS-CoV-2 target nucleic acids are NOT DETECTED.  The SARS-CoV-2 RNA is generally detectable in upper respiratory specimens during the acute phase of infection. The lowest concentration of SARS-CoV-2 viral copies this assay can detect is 138 copies/mL. A negative result does not preclude SARS-Cov-2 infection and should not be used as the sole basis for treatment or other patient management decisions. A negative result may occur with  improper specimen collection/handling, submission of specimen other than nasopharyngeal swab, presence of viral mutation(s) within the areas targeted by this assay, and inadequate number of viral copies(<138 copies/mL). A negative result must be combined with clinical observations, patient history, and epidemiological information. The expected result is Negative.  Fact Sheet for Patients:  bloggercourse.com  Fact Sheet for Healthcare Providers:   seriousbroker.it  This test is no t yet approved or cleared by the [place]  FDA and  has been authorized for detection and/or diagnosis of SARS-CoV-2 by FDA under an Emergency Use Authorization (EUA). This EUA will remain  in effect (meaning this test can be used) for the duration of the COVID-19 declaration under Section 564(b)(1) of the Act, 21 U.S.C.section 360bbb-3(b)(1), unless the authorization is terminated  or revoked sooner.       Influenza A by PCR NEGATIVE NEGATIVE Final   Influenza B by PCR NEGATIVE NEGATIVE Final    Comment: (NOTE) The Xpert Xpress SARS-CoV-2/FLU/RSV plus assay is intended as an aid in the diagnosis of influenza from Nasopharyngeal swab specimens and should not be used as a sole basis for treatment. Nasal washings and aspirates are unacceptable for Xpert Xpress SARS-CoV-2/FLU/RSV testing.  Fact Sheet for Patients: bloggercourse.com  Fact Sheet for Healthcare Providers: seriousbroker.it  This test is not yet approved or cleared by the [place]  FDA and has been authorized for detection and/or diagnosis of SARS-CoV-2 by FDA under an Emergency Use Authorization (EUA). This EUA will remain in effect (meaning this test can be used) for the duration of the COVID-19 declaration under Section 564(b)(1) of the Act, 21 U.S.C. section 360bbb-3(b)(1), unless the authorization is terminated or revoked.     Resp Syncytial Virus by PCR NEGATIVE NEGATIVE Final    Comment: (NOTE) Fact Sheet for Patients: bloggercourse.com  Fact Sheet for Healthcare Providers: seriousbroker.it  This test is not yet approved or cleared by the [place]  FDA and has been authorized for detection and/or diagnosis of SARS-CoV-2 by FDA under an Emergency Use Authorization (EUA). This EUA will remain in effect (meaning this test can be used) for  the duration of the COVID-19 declaration under Section 564(b)(1) of the Act, 21 U.S.C. section 360bbb-3(b)(1), unless the authorization is terminated or revoked.  Performed at [facility], [address] [place], [place] [address]     Labs: CBC: Recent Labs  Lab 09/24/24 0146 09/25/24 0439  WBC 14.5* 11.0*  NEUTROABS 12.1*  --   HGB 15.5 13.0  HCT 45.5 38.8*  MCV 103.9* 106.9*  PLT 156 111*   Basic Metabolic Panel: Recent Labs  Lab 09/24/24 0146 09/24/24 1519 09/25/24 0439  NA 122* 127* 133*  K 4.2 4.5 3.7  CL 81* 92* 94*  CO2 31 21* 28  GLUCOSE 104* 90 78  BUN 19 16 15   CREATININE 1.05 0.90 0.79  CALCIUM  10.1 9.1 8.7*   Liver Function Tests: Recent Labs  Lab 09/24/24 0146  AST 23  ALT 12  ALKPHOS 86  BILITOT 1.1  PROT 7.0  ALBUMIN  4.3   CBG: No results for input(s): GLUCAP in the last 168 hours.  Discharge time spent: greater than 30 minutes.  This record has been created using Conservation officer, historic buildings. Errors have been sought and corrected,but may not always be located. Such creation errors do not reflect on the standard of care.   Signed: Amaryllis Dare, MD [facility] 09/25/2024 "

## 2024-09-25 NOTE — Plan of Care (Signed)

## 2024-09-25 NOTE — Evaluation (Signed)
 Physical Therapy Evaluation Patient Details Name: Justin [name] MRN: [id number] DOB: 04-29-1941 Today's Date: 09/25/2024  History of Present Illness  Patient is a 84 y.o. male who came into ED complaining of nausea, vomiting, and generalized weakness for the last 2 days with inability to keep anything down. Acute viral gastroenteritis, possible CAP and hyponatremia. PMH of HTN, HLD, CAD, A-fib on Eliquis , COPD on home oxygen  3 L by nasal cannula, history of septic shock, prostate cancer  Clinical Impression  Patient is agreeable to PT evaluation. He reports ambulating short distance without device at home, using furniture for support. He also has a rollator if needed. He lives with supportive family.  Today the patient is requesting to get out of bed to chair to have breakfast. He reports feeling better overall but not back to baseline. He was able to perform stand step transfer to chair and stand for several minutes with rolling walker without physical assistance required. He is hopeful for discharge home soon. PT will follow while in the hospital to maximize independence and facilitate return to prior level of function.       If plan is discharge home, recommend the following: A little help with walking and/or transfers;A little help with bathing/dressing/bathroom   Can travel by private vehicle        Equipment Recommendations None recommended by PT  Recommendations for Other Services       Functional Status Assessment Patient has had a recent decline in their functional status and demonstrates the ability to make significant improvements in function in a reasonable and predictable amount of time.     Precautions / Restrictions Precautions Precautions: Fall Recall of Precautions/Restrictions: Intact Restrictions Weight Bearing Restrictions Per Provider Order: No      Mobility  Bed Mobility Overal bed mobility: Needs Assistance Bed Mobility: Supine to Sit     Supine to sit:  Contact guard     General bed mobility comments: increased time required    Transfers Overall transfer level: Needs assistance Equipment used: Rolling walker (2 wheels) Transfers: Bed to chair/wheelchair/BSC Sit to Stand: Supervision   Step pivot transfers: Supervision       General transfer comment: no physical assitance required for standing.    Ambulation/Gait               General Gait Details: patient declined walking at this time. patient stood for several minutes with no loss of balance, no dizziness reported. no shortness of breath with standing activity  Stairs            Wheelchair Mobility     Tilt Bed    Modified Rankin (Stroke Patients Only)       Balance Overall balance assessment: Needs assistance Sitting-balance support: Feet supported Sitting balance-Leahy Scale: Good     Standing balance support: Reliant on assistive device for balance Standing balance-Leahy Scale: Fair Standing balance comment: using rolling walker for support in standing                             Pertinent Vitals/Pain Pain Assessment Pain Assessment: No/denies pain    Home Living Family/patient expects to be discharged to:: Private residence Living Arrangements: Spouse/significant other Available Help at Discharge: Family;Available 24 hours/day Type of Home: House         Home Layout: Two level;Able to live on main level with bedroom/bathroom Home Equipment: Rolling Walker (2 wheels);Rollator (4 wheels);Shower seat;Cane - single point;Other (comment) (  adjustable bed)      Prior Function Prior Level of Function : Independent/Modified Independent             Mobility Comments: using furniture around the home intermittently. also has a rollator, no recent falls ADLs Comments: Mod I with ADLs     Extremity/Trunk Assessment   Upper Extremity Assessment Upper Extremity Assessment: Generalized weakness    Lower Extremity  Assessment Lower Extremity Assessment: Generalized weakness       Communication   Communication Communication: Impaired    Cognition Arousal: Alert Behavior During Therapy: WFL for tasks assessed/performed   PT - Cognitive impairments: No apparent impairments                         Following commands: Intact       Cueing Cueing Techniques: Verbal cues     General Comments General comments (skin integrity, edema, etc.): patient in the chair set up for breatkfast at end of session. he reports he is hopeful for discharge home today    Exercises     Assessment/Plan    PT Assessment Patient needs continued PT services  PT Problem List Decreased strength;Decreased range of motion;Decreased balance;Decreased activity tolerance;Decreased mobility;Decreased safety awareness       PT Treatment Interventions DME instruction;Gait training;Functional mobility training;Therapeutic activities;Therapeutic exercise;Balance training;Neuromuscular re-education;Cognitive remediation;Stair training;Patient/family education    PT Goals (Current goals can be found in the Care Plan section)  Acute Rehab PT Goals Patient Stated Goal: to go home PT Goal Formulation: With patient Time For Goal Achievement: 10/09/24 Potential to Achieve Goals: Fair    Frequency Min 1X/week     Co-evaluation               AM-PAC PT 6 Clicks Mobility  Outcome Measure Help needed turning from your back to your side while in a flat bed without using bedrails?: None Help needed moving from lying on your back to sitting on the side of a flat bed without using bedrails?: A Little Help needed moving to and from a bed to a chair (including a wheelchair)?: A Little Help needed standing up from a chair using your arms (e.g., wheelchair or bedside chair)?: A Little Help needed to walk in hospital room?: A Little Help needed climbing 3-5 steps with a railing? : A Little 6 Click Score: 19    End  of Session   Activity Tolerance: Patient tolerated treatment well Patient left: in chair;with call bell/phone within reach;with chair alarm set Nurse Communication: Mobility status PT Visit Diagnosis: Muscle weakness (generalized) (M62.81);Unsteadiness on feet (R26.81)    Time: 9092-9069 PT Time Calculation (min) (ACUTE ONLY): 23 min   Charges:   PT Evaluation $PT Eval Low Complexity: 1 Low   PT General Charges $$ ACUTE PT VISIT: 1 Visit         Randine Essex, PT, MPT   Randine LULLA Essex 09/25/2024, 9:45 AM

## 2024-09-25 NOTE — Progress Notes (Signed)
 Patient alert and oriented, vss.  Given d/c instructions, no new medications.  Patient wanted to eat first before leaving because he had not had a real meal aside from clears since he came in.  Diet tolerated well and patient agreeable to leave.  Will be escorted out of hospital via wheelchair by volunteers.

## 2024-09-25 NOTE — Plan of Care (Signed)

## 2024-09-25 NOTE — TOC Transition Note (Signed)
 Transition of Care [facility]) - Discharge Note   Patient Details  Name: Justin [name] MRN: [id number] Date of Birth: 1941/05/21  [facility]) CM/SW Contact:  Victory Jackquline RAMAN, RN Phone Number: 09/25/2024, 1:26 PM   Clinical Narrative:   RNCM received a message a message from the MD via secure chat informing me that the patient was being discharged today with HH/PT/OT. RNCM spoke to the patient @ [phone],  introduced myself and my role and explained that discharge planning would be discussed. MD is recommending HH/PT/OT. Patient is in agreement with having HH/PT/OT but wants his PCP to set it up for him after he's discharged. MD made aware. No further concerns. RNCM signing off.   Final next level of care: Home/Self Care Barriers to Discharge: Barriers Resolved   Patient Goals and CMS Choice            Discharge Placement                Patient to be transferred to facility by: Spouse Name of family member notified: [name] Patient and family notified of of transfer: 09/25/24  Discharge Plan and Services Additional resources added to the After Visit Summary for                                       Social Drivers of Health (SDOH) Interventions SDOH Screenings   Food Insecurity: No Food Insecurity (06/21/2024)   Received from [facility]  Housing: Low Risk  (09/20/2024)   Received from [facility]  Transportation Needs: No Transportation Needs (06/21/2024)   Received from [facility]  Utilities: Not At Risk (06/21/2024)   Received from [facility]  Financial Resource Strain: Low Risk  (06/21/2024)   Received from [facility]  Tobacco Use: Medium Risk (09/25/2024)     Readmission Risk Interventions     No data to display

## 2024-09-29 ENCOUNTER — Encounter: Admitting: Physician Assistant

## 2024-09-30 ENCOUNTER — Encounter: Admitting: Physician Assistant

## 2024-10-03 ENCOUNTER — Ambulatory Visit: Admitting: Dermatology

## 2024-10-03 ENCOUNTER — Encounter: Admitting: Physician Assistant

## 2024-10-10 ENCOUNTER — Encounter: Attending: Physician Assistant | Admitting: Physician Assistant

## 2024-10-10 DIAGNOSIS — Z7901 Long term (current) use of anticoagulants: Secondary | ICD-10-CM | POA: Insufficient documentation

## 2024-10-10 DIAGNOSIS — I110 Hypertensive heart disease with heart failure: Secondary | ICD-10-CM | POA: Insufficient documentation

## 2024-10-10 DIAGNOSIS — I890 Lymphedema, not elsewhere classified: Secondary | ICD-10-CM | POA: Insufficient documentation

## 2024-10-10 DIAGNOSIS — I2510 Atherosclerotic heart disease of native coronary artery without angina pectoris: Secondary | ICD-10-CM | POA: Insufficient documentation

## 2024-10-10 DIAGNOSIS — G4730 Sleep apnea, unspecified: Secondary | ICD-10-CM | POA: Insufficient documentation

## 2024-10-10 DIAGNOSIS — I5042 Chronic combined systolic (congestive) and diastolic (congestive) heart failure: Secondary | ICD-10-CM | POA: Insufficient documentation

## 2024-10-10 DIAGNOSIS — L89893 Pressure ulcer of other site, stage 3: Secondary | ICD-10-CM | POA: Insufficient documentation

## 2024-10-10 DIAGNOSIS — L97514 Non-pressure chronic ulcer of other part of right foot with necrosis of bone: Secondary | ICD-10-CM | POA: Insufficient documentation

## 2024-10-10 DIAGNOSIS — I7389 Other specified peripheral vascular diseases: Secondary | ICD-10-CM | POA: Insufficient documentation

## 2024-10-10 DIAGNOSIS — I480 Paroxysmal atrial fibrillation: Secondary | ICD-10-CM | POA: Insufficient documentation

## 2024-10-17 ENCOUNTER — Ambulatory Visit

## 2024-10-25 ENCOUNTER — Encounter: Admitting: Physician Assistant

## 2024-10-28 ENCOUNTER — Encounter: Payer: Self-pay | Admitting: Podiatry

## 2024-10-28 ENCOUNTER — Ambulatory Visit: Admitting: Podiatry

## 2024-10-28 VITALS — Ht 65.0 in | Wt 152.3 lb

## 2024-10-28 DIAGNOSIS — M869 Osteomyelitis, unspecified: Secondary | ICD-10-CM

## 2024-10-28 NOTE — Patient Instructions (Signed)
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
## 2024-10-31 ENCOUNTER — Ambulatory Visit: Admitting: Cardiovascular Disease

## 2024-11-07 ENCOUNTER — Encounter: Admitting: Physician Assistant
